# Patient Record
Sex: Male | Born: 1950 | Race: White | Hispanic: No | Marital: Married | Smoking: Former smoker
Health system: Southern US, Community
[De-identification: ages and names within clinical notes are randomized; demographics above are authoritative.]

## PROBLEM LIST (undated history)

## (undated) DIAGNOSIS — C801 Malignant (primary) neoplasm, unspecified: Secondary | ICD-10-CM

## (undated) DIAGNOSIS — K219 Gastro-esophageal reflux disease without esophagitis: Secondary | ICD-10-CM

## (undated) DIAGNOSIS — N189 Chronic kidney disease, unspecified: Secondary | ICD-10-CM

## (undated) DIAGNOSIS — K469 Unspecified abdominal hernia without obstruction or gangrene: Secondary | ICD-10-CM

## (undated) DIAGNOSIS — C4490 Unspecified malignant neoplasm of skin, unspecified: Secondary | ICD-10-CM

## (undated) DIAGNOSIS — I200 Unstable angina: Secondary | ICD-10-CM

## (undated) DIAGNOSIS — K649 Unspecified hemorrhoids: Secondary | ICD-10-CM

## (undated) DIAGNOSIS — D649 Anemia, unspecified: Secondary | ICD-10-CM

## (undated) DIAGNOSIS — E785 Hyperlipidemia, unspecified: Secondary | ICD-10-CM

## (undated) DIAGNOSIS — E119 Type 2 diabetes mellitus without complications: Secondary | ICD-10-CM

## (undated) DIAGNOSIS — I10 Essential (primary) hypertension: Secondary | ICD-10-CM

## (undated) DIAGNOSIS — M1990 Unspecified osteoarthritis, unspecified site: Secondary | ICD-10-CM

## (undated) HISTORY — DX: Gastro-esophageal reflux disease without esophagitis: K21.9

## (undated) HISTORY — DX: Unspecified hemorrhoids: K64.9

## (undated) HISTORY — DX: Unspecified abdominal hernia without obstruction or gangrene: K46.9

## (undated) HISTORY — DX: Type 2 diabetes mellitus without complications: E11.9

## (undated) HISTORY — DX: Anemia, unspecified: D64.9

## (undated) HISTORY — DX: Hyperlipidemia, unspecified: E78.5

## (undated) HISTORY — DX: Essential (primary) hypertension: I10

## (undated) HISTORY — DX: Unspecified osteoarthritis, unspecified site: M19.90

## (undated) HISTORY — DX: Unstable angina: I20.0

## (undated) HISTORY — DX: Chronic kidney disease, unspecified: N18.9

## (undated) HISTORY — DX: Unspecified malignant neoplasm of skin, unspecified: C44.90

## (undated) HISTORY — DX: Malignant (primary) neoplasm, unspecified: C80.1

---

## 1994-04-24 HISTORY — PX: CORONARY ARTERY BYPASS GRAFT: SHX141

## 2008-03-13 ENCOUNTER — Ambulatory Visit: Payer: Self-pay | Admitting: Cardiology

## 2009-01-08 ENCOUNTER — Ambulatory Visit: Payer: Self-pay | Admitting: Cardiology

## 2009-04-24 HISTORY — PX: CORONARY STENT PLACEMENT: SHX1402

## 2010-04-20 ENCOUNTER — Ambulatory Visit: Payer: Self-pay | Admitting: Internal Medicine

## 2010-04-24 DIAGNOSIS — C801 Malignant (primary) neoplasm, unspecified: Secondary | ICD-10-CM

## 2010-04-24 HISTORY — DX: Malignant (primary) neoplasm, unspecified: C80.1

## 2011-03-28 ENCOUNTER — Ambulatory Visit: Payer: Self-pay

## 2011-04-25 ENCOUNTER — Ambulatory Visit: Payer: Self-pay

## 2011-05-26 ENCOUNTER — Ambulatory Visit: Payer: Self-pay

## 2012-04-24 HISTORY — PX: UPPER GI ENDOSCOPY: SHX6162

## 2012-07-23 HISTORY — PX: COLONOSCOPY: SHX174

## 2012-07-26 ENCOUNTER — Ambulatory Visit: Payer: Self-pay | Admitting: Gastroenterology

## 2012-12-17 ENCOUNTER — Ambulatory Visit: Payer: Self-pay | Admitting: Gastroenterology

## 2013-03-24 ENCOUNTER — Ambulatory Visit: Payer: Self-pay | Admitting: Gastroenterology

## 2013-03-25 ENCOUNTER — Emergency Department: Payer: Self-pay | Admitting: Emergency Medicine

## 2013-03-25 LAB — COMPREHENSIVE METABOLIC PANEL
Alkaline Phosphatase: 64 U/L
Anion Gap: 9 (ref 7–16)
Calcium, Total: 8.4 mg/dL — ABNORMAL LOW (ref 8.5–10.1)
Chloride: 101 mmol/L (ref 98–107)
Co2: 25 mmol/L (ref 21–32)
Creatinine: 1.67 mg/dL — ABNORMAL HIGH (ref 0.60–1.30)
EGFR (African American): 50 — ABNORMAL LOW
Glucose: 171 mg/dL — ABNORMAL HIGH (ref 65–99)
Osmolality: 280 (ref 275–301)
SGOT(AST): 26 U/L (ref 15–37)
SGPT (ALT): 33 U/L (ref 12–78)

## 2013-03-25 LAB — URINALYSIS, COMPLETE
Bacteria: NONE SEEN
Bilirubin,UR: NEGATIVE
Blood: NEGATIVE
Glucose,UR: 50 mg/dL (ref 0–75)
Hyaline Cast: 11
Ketone: NEGATIVE
Leukocyte Esterase: NEGATIVE
Nitrite: NEGATIVE
Ph: 5 (ref 4.5–8.0)
Protein: >=500
RBC,UR: 1 /HPF (ref 0–5)
Specific Gravity: 1.023 (ref 1.003–1.030)
Squamous Epithelial: <1
WBC UR: 2 /HPF (ref 0–5)

## 2013-03-25 LAB — CBC WITH DIFFERENTIAL/PLATELET
Basophil #: 0 10*3/uL (ref 0.0–0.1)
Basophil %: 0.3 %
Eosinophil #: 1.2 10*3/uL — ABNORMAL HIGH (ref 0.0–0.7)
Eosinophil %: 11.6 %
HCT: 35.9 % — ABNORMAL LOW (ref 40.0–52.0)
HGB: 12.5 g/dL — ABNORMAL LOW (ref 13.0–18.0)
Lymphocyte #: 0.5 10*3/uL — ABNORMAL LOW (ref 1.0–3.6)
Lymphocyte %: 4.4 %
MCH: 30.6 pg (ref 26.0–34.0)
MCHC: 34.8 g/dL (ref 32.0–36.0)
MCV: 88 fL (ref 80–100)
Monocyte #: 0.7 x10 3/mm (ref 0.2–1.0)
Monocyte %: 6.8 %
Neutrophil #: 8.1 10*3/uL — ABNORMAL HIGH (ref 1.4–6.5)
Neutrophil %: 76.9 %
Platelet: 200 10*3/uL (ref 150–440)
RBC: 4.08 10*6/uL — ABNORMAL LOW (ref 4.40–5.90)
RDW: 14.3 % (ref 11.5–14.5)
WBC: 10.5 10*3/uL (ref 3.8–10.6)

## 2013-03-30 LAB — CULTURE, BLOOD (SINGLE)

## 2013-04-01 ENCOUNTER — Ambulatory Visit: Payer: Self-pay

## 2013-04-08 ENCOUNTER — Ambulatory Visit: Payer: Self-pay | Admitting: Gastroenterology

## 2013-04-09 ENCOUNTER — Encounter: Payer: Self-pay | Admitting: *Deleted

## 2013-04-10 ENCOUNTER — Inpatient Hospital Stay: Payer: Self-pay

## 2013-04-10 DIAGNOSIS — I2510 Atherosclerotic heart disease of native coronary artery without angina pectoris: Secondary | ICD-10-CM

## 2013-04-10 LAB — BASIC METABOLIC PANEL
Anion Gap: 2 — ABNORMAL LOW (ref 7–16)
BUN: 20 mg/dL — ABNORMAL HIGH (ref 7–18)
Calcium, Total: 8.4 mg/dL — ABNORMAL LOW (ref 8.5–10.1)
Co2: 30 mmol/L (ref 21–32)
Creatinine: 1.34 mg/dL — ABNORMAL HIGH (ref 0.60–1.30)
EGFR (African American): >60
EGFR (Non-African Amer.): 56 — ABNORMAL LOW
Glucose: 159 mg/dL — ABNORMAL HIGH (ref 65–99)
Osmolality: 284 (ref 275–301)
Potassium: 3.9 mmol/L (ref 3.5–5.1)
Sodium: 139 mmol/L (ref 136–145)

## 2013-04-10 LAB — HEMOGLOBIN
HGB: 8.4 g/dL — ABNORMAL LOW (ref 13.0–18.0)
HGB: 8.2 g/dL — ABNORMAL LOW (ref 13.0–18.0)

## 2013-04-11 LAB — CBC WITH DIFFERENTIAL/PLATELET
Eosinophil %: 0.6 %
HCT: 24.4 % — ABNORMAL LOW (ref 40.0–52.0)
Lymphocyte #: 1.7 10*3/uL (ref 1.0–3.6)
Lymphocyte %: 30.1 %
MCH: 28.6 pg (ref 26.0–34.0)
MCHC: 33.5 g/dL (ref 32.0–36.0)
Monocyte %: 7.1 %
Neutrophil #: 3.5 10*3/uL (ref 1.4–6.5)
Platelet: 385 10*3/uL (ref 150–440)
RBC: 2.86 10*6/uL — ABNORMAL LOW (ref 4.40–5.90)
RDW: 14.2 % (ref 11.5–14.5)
WBC: 5.7 10*3/uL (ref 3.8–10.6)

## 2013-04-11 LAB — BASIC METABOLIC PANEL
Anion Gap: 5 — ABNORMAL LOW (ref 7–16)
Co2: 29 mmol/L (ref 21–32)
EGFR (Non-African Amer.): 58 — ABNORMAL LOW
Glucose: 111 mg/dL — ABNORMAL HIGH (ref 65–99)
Osmolality: 279 (ref 275–301)
Potassium: 3.6 mmol/L (ref 3.5–5.1)

## 2013-04-15 ENCOUNTER — Ambulatory Visit (INDEPENDENT_AMBULATORY_CARE_PROVIDER_SITE_OTHER): Payer: 59 | Admitting: General Surgery

## 2013-04-15 ENCOUNTER — Encounter: Payer: Self-pay | Admitting: General Surgery

## 2013-04-15 VITALS — BP 130/70 | HR 72 | Resp 14 | Ht 67.0 in | Wt 213.0 lb

## 2013-04-15 DIAGNOSIS — K625 Hemorrhage of anus and rectum: Secondary | ICD-10-CM

## 2013-04-15 DIAGNOSIS — D62 Acute posthemorrhagic anemia: Secondary | ICD-10-CM

## 2013-04-15 LAB — POC HEMOCCULT BLD/STL (OFFICE/1-CARD/DIAGNOSTIC): Fecal Occult Blood, POC: NEGATIVE

## 2013-04-15 NOTE — Progress Notes (Signed)
Patient ID: Roger Knight, male   DOB: 01-Jun-1950, 62 y.o.   MRN: [id number]  Chief Complaint  Patient presents with  . Other    hemorrhoids  Roger Knight is a 62 y.o. male here today for evaluation of hemorrhoids. Patient states he has had them for about three years now. He states they have been bleeding for about six months and noticed some bright red blood in his stool.  The patient presents a complicated history of ongoing rectal bleeding and anemia resulting in hospitalization earlier this month. He has had intermittent bright red blood per dating back to at least bring 2014. This has waxed and waned in severity. Episodes have been painless. He had been maintained on Effient as well as aspirin is post coronary stent placement several years ago. He had undergone coronary artery bypass grafting in the distant past.  No records were available from the referring physician, and it was recreated making use of the hospital data set only.  Colonoscopy completed in April 2014 for heme positive stool and iron deficiency anemia showed diverticuli in the descending and sigmoid colon, and scattered diverticula in the transverse and descending colon. Polyps from the splenic flexure showed tubular adenomas. Upper endoscopy completed at the same time showed erosive gastritis with biopsies failing to show evidence of H. pylori infection. Small bowel follow-through dated 12/17/2012 showed a significantly abnormal transit-time without evidence of dilatation or segmental narrowing. Focal outpouching in the loops of jejunum in the left upper quadrant were felt consistent with small bowel diverticuli. The patient reports capsule endoscopy was completed, but these results could not be located in the hospital data base.   The patient reports since discontinuation of his oral anticoagulants he's had no further rectal bleeding.  HPI  Past Medical History  Diagnosis Date  . Arthritis   . GERD (gastroesophageal  reflux disease)   . Diabetes mellitus without complication   . Hernia   . Anemia   . Hyperlipidemia   . Hypertension   . Carcinoma 2012     skin cancer on neck [facility]    Past Surgical History  Procedure Laterality Date  . Coronary artery bypass graft  1996    [facility]  . Coronary stent placement  2011    [facility]  . Colonoscopy  April 2014  . Upper gi endoscopy  2014    No family history on file.  Social History History  Substance Use Topics  . Smoking status: Former Smoker    Quit date: 04/24/2005  . Smokeless tobacco: Not on file  . Alcohol Use: No    No Known Allergies  Current Outpatient Prescriptions  Medication Sig Dispense Refill  . amLODipine (NORVASC) 5 MG tablet Take 5 mg by mouth daily.      Marland Kitchen aspirin 81 MG tablet Take 81 mg by mouth daily.      Marland Kitchen atorvastatin (LIPITOR) 80 MG tablet Take 80 mg by mouth daily.      Marland Kitchen ezetimibe (ZETIA) 10 MG tablet Take 10 mg by mouth daily.      . Fe Fum-FePoly-Vit C-Vit B3 (INTEGRA PO) Take 1 tablet by mouth 2 (two) times daily.      Marland Kitchen glipiZIDE (GLUCOTROL) 10 MG tablet Take 10 mg by mouth 2 (two) times daily.      . insulin aspart (NOVOLOG) 100 UNIT/ML injection Inject 25 Units into the skin once.      . insulin aspart (NOVOLOG) 100 UNIT/ML injection Inject 74 Units into the skin once.      Marland Kitchen  Liraglutide (VICTOZA [place]) Inject 1.2 Units into the skin daily.      Marland Kitchen lisinopril (PRINIVIL,ZESTRIL) 20 MG tablet Take 20 mg by mouth daily.      Marland Kitchen loratadine (CLARITIN) 10 MG tablet Take 10 mg by mouth daily.      . metFORMIN (GLUCOPHAGE) 500 MG tablet Take by mouth 2 (two) times daily with a meal.      . metoprolol (TOPROL-XL) 200 MG 24 hr tablet Take 200 mg by mouth daily.      . Multiple Vitamins-Minerals (MULTIVITAMIN WITH MINERALS) tablet Take 1 tablet by mouth daily.      . pantoprazole (PROTONIX) 20 MG tablet Take 20 mg by mouth daily.      . pregabalin (LYRICA) 75 MG capsule Take 75 mg by mouth 2 (two) times daily.      . sildenafil  (VIAGRA) 100 MG tablet Take 100 mg by mouth as needed for erectile dysfunction.      . valsartan-hydrochlorothiazide (DIOVAN-HCT) 320-25 MG per tablet Take 1 tablet by mouth daily.       No current facility-administered medications for this visit.    Review of Systems Review of Systems  Constitutional: Negative.   Respiratory: Negative.   Cardiovascular: Negative.   Gastrointestinal: Positive for blood in stool, anal bleeding and rectal pain. Negative for nausea, vomiting, abdominal pain, diarrhea, constipation and abdominal distention.    Blood pressure 130/70, pulse 72, resp. rate 14, height 5\' 7"  (1.702 m), weight 213 lb (96.616 kg).  Physical Exam Physical Exam  Constitutional: He is oriented to person, place, and time. He appears well-developed and well-nourished.  Eyes: No scleral icterus.  Cardiovascular: Normal rate, regular rhythm, normal heart sounds, intact distal pulses and normal pulses.   Pulmonary/Chest: Breath sounds normal.  Abdominal: Soft. Bowel sounds are normal. There is no tenderness.  Genitourinary: Rectal exam shows external hemorrhoid and internal hemorrhoid. Guaiac negative stool.  No bleeding is evident. Stool is Hemoccult negative.  Lymphadenopathy:    He has no cervical adenopathy.  Neurological: He is alert and oriented to person, place, and time.  Skin: Skin is warm and dry.    Data Reviewed Endoscopy was completed. Nonbleeding internal and external hemorrhoids were identified. The rectal mucosa was unremarkable. Normal sphincter tone. 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 Hemoglobin 8.2. Normal electrolytes platelet count and 85,000. White blood cell count 5700 with normal differential.  Abdominal ultrasound dated 04/01/2013 showed evidence of cholelithiasis without cholecystitis.  Gastric emptying study completed 04/08/2013 suggested delayed gastric  emptying. Assessment    Bright red rectal bleeding likely secondary to anorectal source, improved with cessation of oral anticoagulants.    Plan    The patient had previously been prescribed Analpram for internal/external application, which he had not used in spring when his symptoms abated. He will make use of this twice a day and give a phone report in 2 weeks in regards to his progress. If he has recurrent episodes of bright red rectal bleeding, formal hemorrhoidectomy can be considered. If he has no further symptoms, surgical intervention will be deferred.        Robert Bellow 04/17/2013, 9:49 AM

## 2013-04-17 DIAGNOSIS — K625 Hemorrhage of anus and rectum: Secondary | ICD-10-CM | POA: Insufficient documentation

## 2013-04-17 DIAGNOSIS — D62 Acute posthemorrhagic anemia: Secondary | ICD-10-CM | POA: Insufficient documentation

## 2013-04-29 ENCOUNTER — Ambulatory Visit: Payer: 59 | Admitting: General Surgery

## 2013-05-28 ENCOUNTER — Encounter: Payer: Self-pay | Admitting: *Deleted

## 2013-12-17 DIAGNOSIS — M19049 Primary osteoarthritis, unspecified hand: Secondary | ICD-10-CM | POA: Insufficient documentation

## 2013-12-17 DIAGNOSIS — M5416 Radiculopathy, lumbar region: Secondary | ICD-10-CM | POA: Insufficient documentation

## 2013-12-17 DIAGNOSIS — M5136 Other intervertebral disc degeneration, lumbar region: Secondary | ICD-10-CM | POA: Insufficient documentation

## 2013-12-26 ENCOUNTER — Ambulatory Visit: Payer: Self-pay | Admitting: Physical Medicine and Rehabilitation

## 2014-02-11 DIAGNOSIS — I200 Unstable angina: Secondary | ICD-10-CM | POA: Insufficient documentation

## 2014-02-11 DIAGNOSIS — I10 Essential (primary) hypertension: Secondary | ICD-10-CM | POA: Insufficient documentation

## 2014-02-11 HISTORY — DX: Unstable angina: I20.0

## 2014-04-24 DIAGNOSIS — N189 Chronic kidney disease, unspecified: Secondary | ICD-10-CM

## 2014-04-24 HISTORY — DX: Chronic kidney disease, unspecified: N18.9

## 2014-05-05 DIAGNOSIS — R809 Proteinuria, unspecified: Secondary | ICD-10-CM | POA: Insufficient documentation

## 2014-05-05 DIAGNOSIS — G629 Polyneuropathy, unspecified: Secondary | ICD-10-CM | POA: Insufficient documentation

## 2014-05-05 DIAGNOSIS — Z794 Long term (current) use of insulin: Secondary | ICD-10-CM | POA: Insufficient documentation

## 2014-07-21 ENCOUNTER — Other Ambulatory Visit: Payer: Self-pay

## 2014-07-21 NOTE — Patient Outreach (Signed)
[facility]) Care Management  07/21/2014   DEMETRES DARRAGH [birth year] [id number]  Subjective: Spoke to [name] by phone today regarding the change in his Novolog 70/30 to Lantus and Novolog.  He has no concerns with it, in fact he noticed his blood sugars were not improving when he made the switch so Dr. Gabriel Carina increased the Novolog and [name] is getting better blood sugar control- states blood sugars are 135-145mg /dl fasting every day, pre-meal blood sugars always less than 150mg /dl.    Current Medications:  Current Outpatient Prescriptions  Medication Sig Dispense Refill  . amLODipine (NORVASC) 5 MG tablet Take 5 mg by mouth daily.    Marland Kitchen aspirin 81 MG tablet Take 81 mg by mouth daily.    Marland Kitchen atorvastatin (LIPITOR) 80 MG tablet Take 80 mg by mouth daily.    Marland Kitchen ezetimibe (ZETIA) 10 MG tablet Take 10 mg by mouth daily.    Marland Kitchen glipiZIDE (GLUCOTROL) 10 MG tablet Take 10 mg by mouth 2 (two) times daily.    . insulin aspart (NOVOLOG) 100 UNIT/ML injection Inject 15 Units into the skin daily at 12 noon.    . insulin aspart (NOVOLOG) 100 UNIT/ML injection Inject 20 Units into the skin 2 (two) times daily with a meal. Pre breakfast and supper    . insulin glargine (LANTUS) 100 UNIT/ML injection Inject 65 Units into the skin at bedtime.    . Liraglutide (VICTOZA [place]) Inject 1.2 Units into the skin daily.    . metFORMIN (GLUCOPHAGE) 500 MG tablet Take by mouth 2 (two) times daily with a meal.    . metoprolol (TOPROL-XL) 200 MG 24 hr tablet Take 200 mg by mouth daily.    . pregabalin (LYRICA) 75 MG capsule Take 75 mg by mouth 2 (two) times daily.    . valsartan-hydrochlorothiazide (DIOVAN-HCT) 320-25 MG per tablet Take 1 tablet by mouth daily.    . Fe Fum-FePoly-Vit C-Vit B3 (INTEGRA PO) Take 1 tablet by mouth 2 (two) times daily.    . insulin aspart (NOVOLOG) 100 UNIT/ML injection Inject 25 Units into the skin once.    . insulin aspart (NOVOLOG) 100 UNIT/ML injection Inject 74 Units into  the skin once.    Marland Kitchen lisinopril (PRINIVIL,ZESTRIL) 20 MG tablet Take 20 mg by mouth daily.    Marland Kitchen loratadine (CLARITIN) 10 MG tablet Take 10 mg by mouth daily.    . Multiple Vitamins-Minerals (MULTIVITAMIN WITH MINERALS) tablet Take 1 tablet by mouth daily.    . pantoprazole (PROTONIX) 20 MG tablet Take 20 mg by mouth daily.    . sildenafil (VIAGRA) 100 MG tablet Take 100 mg by mouth as needed for erectile dysfunction.     No current facility-administered medications for this visit.   [place]        Patient Outreach Telephone from 07/21/2014 in [place] Problem One  patient is not meeting the national goal for exercise- 150 minutes/week   Care Plan for Problem One  Active   Interventions for Problem One Long Term Goal  - Begin slowly aiming for 10 minutes per day 5x/week- increase 5 minutes per day each week until you reach 30 minutes a day, 5x/week   [facility] Long Term Goal (31-90 days)  Patient will exercise outside his job- 150 minutes per week by the next visit- end of May, 2016   [place] Term Goal Start Date  07/21/14      Plan: [name] will  continue to take 65 units Lantus daily and Novolog 3 times per day; 15 units with lunch, 20 units with breakfast and supper.  Wants to increase exercise- encouraged and goal set.   Follow up with Dr. Gabriel Carina in April 2016 at which time she will likely do an A1C test.  I will follow up with [name] in May, 2016.    [name], RN, BA, [place], [place] Direct Dial:  [phone]  Fax:  [phone] E-mail: [email or web address] [address] [place], [place]  [address]

## 2014-08-14 NOTE — H&P (Signed)
PATIENT NAME:  Roger Knight, Roger Knight MR#:  [id number] DATE OF BIRTH:  1950-09-26  DATE OF ADMISSION:  04/10/2013  REASON FOR CONSULTATION: Acute lower gastrointestinal bleed.   HISTORY OF PRESENT ILLNESS: This is a very pleasant 64 year old gentleman who works in [employer]. We have been following him recently for anemia, as well as GI bleeding. He has had an extensive workup with GI at [facility]. This has included upper endoscopy in 07/2012, colonoscopy as well in 07/2012 and capsule endoscopy in 02/2013 and a gastric emptying study last week. In the past, he has had hemoglobins down to 9.9 with evidence of iron deficiency, most recently back in April when his ferritin was 6 and his hemoglobin was down to 9.9. Hemoccult testing was positive and that is what initiated his initial workup. He also has a history of coronary artery disease and is on Effient and aspirin. With iron supplementation, his hemoglobin had actually increased quite nicely to 12.8 in 09/2012. However, he continued to have some bright red blood per rectum over the last 2 to 3 weeks. His hemoglobin at the beginning of December was 12.0 and has slowly trended down to 10.4 on the 11th, 9.2 on the 17th and 8.5 today. He reports several episodes of passing bright red blood in the toilet without actually having a bowel movement. This is painless rectal bleeding without any abdominal pain, nausea or vomiting. He has had an episode earlier this month where he had high fevers and was seen in the ER and was treated with levofloxacin. It is unclear if this was just a viral infection but he was treated with levofloxacin as his white count had elevated to 12.3 with a neutrophilic pattern. He clinically responded to this but had a bump in his LFTs, mainly in his ALT up to about 100. This was presumably due to the Levaquin and has improved since he stopped that.   Currently, the patient says his last bowel movement was  yesterday, which was soft and had some small amount of bright red blood in it. He is having no chest pain, shortness of breath or edema. He remains on his cardiovascular meds, as well as his Effient and aspirin. He does also take medications for diabetes.   PAST MEDICAL HISTORY: 1.  Type 2 diabetes.  2.  Coronary artery disease, status post percutaneous stent placement.  3.  Hiatal hernia. 4.  Dyslipidemia.  5.  Rectal dysfunction.  6.  GERD.   PAST SURGICAL HISTORY:  Four-vessel bypass surgery in 1996.   SOCIAL HISTORY: The patient is married. 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 He has 1 child and 2 stepchildren. He does not smoke cigarettes. No alcohol use.   FAMILY HISTORY: No diabetes, heart disease or hypertension.  REVIEW OF SYSTEMS:  Eleven systems reviewed and negative except as per HPI.   CURRENT MEDICATIONS: Include aspirin, Claritin, Zetia, metoprolol, amlodipine, Diovan, Viagra, glipizide, atorvastatin, Victoza, lisinopril, Effient, NovoLog, Lyrica, Protonix, metformin, Tylenol.   ALLERGIES:  No known drug allergies.   PHYSICAL EXAMINATION: GENERAL: The patient is afebrile. Blood pressure is 112/72, pulse 80, weight 209.  GENERAL: He is pleasant, interactive, in no acute distress.  HEENT: Pupils equal, round, reactive to light and accommodation. Extraocular movements are intact. Sclerae anicteric. Oropharynx is clear. NECK: Supple.  HEART: Regular.  LUNGS: Clear.  ABDOMEN: Soft, nontender, nondistended. No hepatosplenomegaly.  EXTREMITIES: No clubbing, cyanosis or edema.  NEUROLOGIC: He is alert  and oriented x 3. Grossly nonfocal neuro exam.  DIAGNOSTIC AND RADIOLOGICAL DATA:  PT/INR done 12/18 was 0.9. White blood count 5.0, hemoglobin 8.5 trending down from 9.2 yesterday, 10.4 on the 11th, 11.4 on the 8th and 11.5 on the 5th and 12.0 on the 1st. Hepatic panel done 12/17 shows albumin slightly low at 3.3 and total protein slightly low at 6.0. He  had hepatology testing 12/11 with negative hep C, hepatitis B surface antigen and positive hepatitis B surface antibody. Given his prior abnormal LFTs and febrile episode, he had a CMV IgM which was negative. Basic metabolic panel done [id number] shows a creatinine of 1.3. His most recent A1c done in September showed an A1c of 7.3. The patient did have an ultrasound of his abdomen done 04/01/2013, which showed cholelithiasis without associated findings to suggest acute cholecystitis. There were bilateral renal cysts. He was seen by nephrology for the renal cysts. We felt these were likely benign. He had an x-ray of his abdomen 03/24/2013 which showed nonspecific bowel gas pattern. There is a moderate amount of right-sided colonic stool. The patient had a gastric emptying study done 12/16, which showed delayed gastric emptying study.   IMPRESSION: A 64 year old gentleman coronary artery disease and hyperlipidemia and diabetes who has had decreasing hemoglobin over the last 2 to 3 weeks from 12 to 8.4 today. He has had bright red blood per rectum which has been painless. He has undergone an outpatient workup which has been relatively unrevealing. He does have known hemorrhoids. 1.  Gastrointestinal bleed. I suspect this is hemorrhoidal or diverticular. We will admit him and do type and screen. He does have a history of coronary artery disease. I want to make sure we do not develop issues with ischemia and angina with the decreasing hemoglobin. He is currently on aspirin and Effient and I will hold for today and discuss further with Dr. Ubaldo Glassing or on-call cardiology regarding this. We will consult gastroenterology and I have spoken with Dr. Allen Norris who is on-call. I suspect the patient needs likely a flexible sigmoidoscopy to look for source of bleeding. He may need surgical consultation to fix any hemorrhoids that are present. He does have known diverticulosis or if the bleeding continues without an obvious source and a  flexible sigmoidoscopy, he may benefit from a bleeding scan.  2.  Coronary artery disease and hypertension. We will resume his outpatient medications and consult cardiology regarding holding his Effient and aspirin. He is relatively asymptomatic at this point.  3.  Diabetes. We will place him on his outpatient regimen, as well as sliding scale insulin. We will hold his metformin while he is inpatient.  4.  Hyperlipidemia. We will continue him on his atorvastatin at this point.  5.  Delayed gastric emptying with likely gastroparesis. He is relatively asymptomatic from this point of view but he can follow up with gastroenterology and potentially start Reglan as needed. 6.  Deep vein thrombosis prophylaxis. We will place the patient on sequential compression devices as he is actively bleeding and we will avoid anticoagulants. We will continue him on proton pump inhibitor as well.  Consultations have been placed for gastroenterology and cardiology.   ____________________________ Cheral Marker. Ola Spurr, MD dpf:ce D: 04/10/2013 15:47:55 ET T: 04/10/2013 16:12:44 ET JOB#: [id number]  cc: Cheral Marker. Ola Spurr, MD, <Dictator> Hafiz Irion Ola Spurr MD ELECTRONICALLY SIGNED 04/12/2013 20:53

## 2014-08-14 NOTE — Discharge Summary (Signed)
PATIENT NAME:  Roger Knight, Roger Knight MR#:  [id number] DATE OF BIRTH:  1951/03/20  DATE OF ADMISSION:  04/10/2013 DATE OF DISCHARGE:  04/11/2013  REASON FOR ADMISSION: GI bleed, anemia and coronary artery disease.   HISTORY OF PRESENT ILLNESS: Please see initial history and physical for details. Briefly, the patient was admitted with decreasing hemoglobin from 12 down to 8.5 as an outpatient in the setting of recurrent bright red blood per rectum. He had had an extensive workup. Please see admission history and physical for details.   HOSPITAL COURSE BY ISSUE: GI bleed. His hemoglobin trended down to 8.2. He was seen by cardiology and GI. He had no further active bleeding. His Effient was held. He will follow up with surgery on Tuesday for evaluation of possible hemorrhoidectomy. It is also possible, he has diverticular bleeding. If his hemorrhoids are corrected and he continues to bleed, he will likely need further evaluation for the diverticular bleeding.   DISCHARGE MEDICATIONS: Please see [facility] discharge summary details.   DISCHARGE DIET: Bland soft diet until followup.  DISCHARGE FOLLOWUP: The patient needs to come Monday for a hemoglobin check in my clinic. I will see him in 1 to 2 weeks otherwise.   TIME SPENT: 35 minutes.  ____________________________ Cheral Marker. Ola Spurr, MD dpf:aw D: 04/11/2013 13:17:15 ET T: 04/11/2013 14:05:39 ET JOB#: [id number]  cc: Cheral Marker. Ola Spurr, MD, <Dictator> Amayra Kiedrowski Ola Spurr MD ELECTRONICALLY SIGNED 04/12/2013 20:54

## 2014-08-14 NOTE — Consult Note (Signed)
Brief Consult Note: Diagnosis: GI bleed, anemia.   Patient was seen by consultant.   Consult note dictated.   Comments: Roger Knight is a pleasant 64 y/o caucasian male with chronic IDA & intermittent gross bright red rectal bleeding.  Extensive work-up at [place] pt has appt Tues outpatient for surgical opinion regarding hemorrhoids.  Effient & Aspirin concommitantly significantly increase his risk of GI bleeding  Cannot r/o diverticular bleed at this time.  Bleeding scan nor flexible sigmoidoscopy would be helpful as last episode of bleeding was over 24 hrs ago.  Follow hgb for stability & consider above if another episode of bleeding occurs.  I have discussed his care with Dr Lucilla Lame.  Thanks for consult.  Please see full dictated note. [id number].  Electronic Signatures: Andria Meuse (NP)  (Signed 18-Dec-14 21:40)  Authored: Brief Consult Note   Last Updated: 18-Dec-14 21:40 by Andria Meuse (NP)

## 2014-08-14 NOTE — Consult Note (Signed)
Chief Complaint:  Subjective/Chief Complaint Pt denies any further bleeding, abdominal pain, nausea or vomiting.  Hgb stable.   VITAL SIGNS/ANCILLARY NOTES: **Vital Signs.:   19-Dec-14 07:44  Vital Signs Type Pre Medication  Temperature Temperature (F) 97.9  Celsius 36.6  Temperature Source oral  Pulse Pulse 71  Respirations Respirations 18  Systolic BP Systolic BP [id number]  Diastolic BP (mmHg) Diastolic BP (mmHg) 85  Mean BP 105  Pulse Ox % Pulse Ox % 98  Pulse Ox Activity Level  At rest  Oxygen Delivery Room Air/ 21 %   Brief Assessment:  GEN well developed, well nourished, no acute distress, A/Ox3.  Wife at bedside.   Cardiac Regular   Respiratory normal resp effort   Gastrointestinal Normal   Gastrointestinal details normal Soft  Nontender  Nondistended  Bowel sounds normal  No rebound tenderness  No gaurding   EXTR negative cyanosis/clubbing, negative edema   Additional Physical Exam Skin: pink, warm & dry   Lab Results: Routine Chem:  19-Dec-14 06:39   Sodium, Serum 139   Assessment/Plan:  Assessment/Plan:  Assessment GI bleed:  Likely diverticular vs. hemorrhoidal on effient & aspirin.  Appreciate cardiology assistance.  Effient discontinued. Reviewed capsule report & abdnormal mucosa proximal 1/3 small bowel noted.  Clinic had no records of celiac panel.  DC home today.   Plan 1) Continue BID PPI 2)  TTG IgA/IgA 3) FU with surgeon regarding hemorrhoids 4) Monitor for further bleeding & call Dr Gustavo Lah if recurs ASAP 5) Return to Dr Gustavo Lah to complete IDA work-up Please call if you have any questions or concerns   Electronic Signatures: Andria Meuse (NP)  (Signed 19-Dec-14 10:24)  Authored: Chief Complaint, VITAL SIGNS/ANCILLARY NOTES, Brief Assessment, Lab Results, Assessment/Plan   Last Updated: 19-Dec-14 10:24 by Andria Meuse (NP)

## 2014-08-14 NOTE — Consult Note (Signed)
PATIENT NAME:  Roger Knight, Roger Knight MR#:  [id number] DATE OF BIRTH:  14-Jun-1950  DATE OF CONSULTATION:  04/10/2013  REFERRING PHYSICIAN:  Cheral Marker. Ola Spurr, MD CONSULTING PHYSICIAN:  Corey Skains, MD  REASON FOR CONSULTATION: Coronary artery disease, previous myocardial infarction, hypertension, hyperlipidemia, diabetes, with lower GI bleed and concerns for antiplatelet therapy.   CHIEF COMPLAINT: "I have a GI bleed."  HISTORY OF PRESENT ILLNESS: This is a 64 year old male with known coronary artery disease, status post coronary artery bypass graft 18 years ago and stent placement of the left main after failed graft 3 years prior. He has had a recent stress test within the last 6 months with no evidence of significant myocardial ischemia but previous myocardial infarction. He has been on appropriate medication management for his cardiovascular disease as well as Effient and aspirin 81 mg. He has had some bleeding issues in the past. He now has a lower GI bleed with quite a bit of bright red blood and hemoglobin dropping down to 8.7. Creatinine is 1.3, consistent with possible mild dehydration. He feels good at this time, with an EKG showing normal sinus rhythm, normal EKG, and no evidence of myocardial infarction or anginal symptoms or heart failure. Therefore, further evaluation and treatment options are necessary.   REVIEW OF SYSTEMS: The remainder review of systems is negative for vision change, ringing in the ears, hearing loss, cough, congestion, heartburn, nausea, vomiting, diarrhea. Positive for bloody stool. Negative chest pain, syncope, dizziness, nausea, diaphoresis, known blood clots, frequent urination, urination at night, muscle weakness, numbness, anxiety, depression, skin lesions, skin rashes.   PAST MEDICAL HISTORY: 1.  Coronary artery disease. 2.  Bypass surgery.  3.  Myocardial infarction.  4.  Hypertension.  5.  Diabetes.  6.  GI bleed.   FAMILY HISTORY: No family  members with early onset of cardiovascular disease or hypertension.   SOCIAL HISTORY: Currently denies alcohol or tobacco use.   ALLERGIES: As listed.   MEDICATIONS: As listed.   PHYSICAL EXAMINATION: VITAL SIGNS: Blood pressure is 122/68 bilaterally, heart rate 62 upright, reclining and regular.  GENERAL: He is a well appearing male in no acute distress.  HEENT: No icterus, thyromegaly, ulcers, hemorrhage, or xanthelasma.  CARDIOVASCULAR: Regular rate and rhythm with normal S1 and S2 without murmur, gallop, or rub. PMI is diffuse. Carotid upstroke normal without bruit. Jugular venous pressure is normal.  LUNGS: Clear to auscultation with normal respirations.  ABDOMEN: Soft, nontender, without hepatosplenomegaly or masses. Abdominal aorta is normal size without bruit.  EXTREMITIES: 2+ pulses in dorsal, pedal, radial, and femoral arteries without lower extremity edema, cyanosis, clubbing, or ulcers.  NEUROLOGIC: He is oriented to time, place, and person, with normal mood and affect.   ASSESSMENT: A 64 year old male with known coronary artery disease, hypertension, hyperlipidemia, diabetes, with a lower gastrointestinal bleed, on Effient and aspirin, needing adjustments. We have discussed at length all risks and benefits of left main coronary artery stenting and gastrointestinal bleeds, and the patient would benefit from changes.   RECOMMENDATIONS: 1.  Continue medication management for risk factors for cardiovascular disease including hypertension, hyperlipidemia, diabetes, with no changes to that. 2.  Discontinue Effient, likely causing or exacerbating lower GI bleed.  3.  Continue aspirin for further risk reduction and bleeding.  4.  No further cardiac diagnostics at this time due to patient currently is not having any significant cardiovascular symptoms of angina or congestive heart failure.  5.  Proceed to further treatment and work-up of GI bleed  as necessary due to patient at relatively  low risk.   ____________________________ Corey Skains, MD bjk:jcm D: 04/10/2013 19:36:54 ET T: 04/10/2013 19:54:56 ET JOB#: [id number]  cc: Corey Skains, MD, <Dictator> Corey Skains MD ELECTRONICALLY SIGNED 04/22/2013 8:05

## 2014-08-15 NOTE — Consult Note (Signed)
PATIENT NAME:  Roger Knight, Roger Knight MR#:  [id number] DATE OF BIRTH:  May 15, 1950  DATE OF CONSULTATION:  04/10/2013  CONSULTING PHYSICIAN:  Andria Meuse, NP and Lucilla Lame, MD  PRIMARY CARE PHYSICIAN: Dr. Adrian Prows   PRIMARY GASTROENTEROLOGIST: Dr. Gustavo Lah.   CARDIOLOGIST: Dr. Ubaldo Glassing.   REASON FOR CONSULTATION: GI bleed, anemia.   HISTORY OF PRESENT ILLNESS: Roger Knight is a 64 year old Caucasian male patient of [place] with Dr. Gustavo Lah. Over the last 5 months, he has been extensively evaluated for iron deficiency anemia. He reports over the last 1 to 2 weeks, he has had a couple episodes of gross bright red blood with clots without a bowel movement. About 2 weeks ago, he was treated with Levaquin and had a fever of 103. Levaquin has been stopped, as he did have a mild bump in his transaminases. His workup has included an EGD by Dr. Gustavo Lah, 07/26/2012, which showed an irregular Z line, moderate chronic erosive gastritis and was a friable gastric body edema and was negative for H. pylori. Colonoscopy of the same date showed multiple sigmoid, descending, transverse and ascending colon diverticula. He had 3 splenic flexure polyps removed; the largest 11 mm, and there were 2 tubular adenomas and hyperplastic polyps. He has had an abnormal gastric emptying study, and was given a prescription for Reglan 5 mg t.i.d., but has not started this yet. He denies any nausea, vomiting, heartburn or indigestion. He is on Protonix 40 mg b.i.d. He does report getting capsule study in November, which was normal. He is being referred to Dr. Bary Castilla and has an appointment next Tuesday for consideration of hemorrhoid management. His last bowel movement was yesterday morning. He does take 2 FiberCon b.i.d. and docusate b.i.d. He denies any diarrhea or constipation, although he did have 4 or 5 loose stools over the weekend. He occasionally has some "gas pains," which pass with flatulence. His  hemoglobin was 12.5 on December 2nd, down to 9.2 yesterday, down to 8.4 today. He is on aspirin 81 mg daily, as well as Effient daily with a history of stent and CABG. Up until July, he was taking 4 Advil every morning, but has not taken any NSAIDs recently. He denies any alcohol use. Ultrasound of the abdomen done December 9th showed cholelithiasis without acute cholecystitis, bilateral renal cysts.  X-ray of the abdomen December 1st showed nonspecific bowel gas pattern, moderate amount of right-sided colonic stool.   PAST MEDICAL AND SURGICAL HISTORY: Hyperlipidemia, diabetes mellitus, hypertension, chronic iron deficiency anemia with a ferritin of 6 in April 2014 with EGD, colonoscopy and capsule study as above; coronary artery disease, status post CABG and stent placement, followed by Dr. Ubaldo Glassing on Effient and aspirin; diverticulosis, tubular adenomas of the colon and chronic GERD and gastroparesis (not yet on medications, but asymptomatic).   MEDICATIONS PRIOR TO ADMISSION: Aspirin 81 mg daily, acetaminophen 325 mg q.4 hours p.r.n. amlodipine 5 mg daily, atorvastatin 80 mg daily, Colace 100 mg b.i.d., FiberCon 2 tabs b.i.d., Diovan HCT 25/320 daily, Effient 10 mg daily, ezetimibe 10 mg daily, glipizide 10 mg b.i.d. before meals, sliding scale insulin 4 times a day, iron 100 mg and vitamin B complex with C, folic acid and iron daily; lisinopril 20 mg daily, loratadine 10 mg daily, metoprolol succinate 200 mg daily, multivitamins daily, Novolin 70/30, 25 units in the morning and 64 units at bedtime, and pantoprazole 40 mg b.i.d., pregabalin 75 mg b.i.d., Victoza 18 mg per 3 mL, 1.2 units subcutaneously at bedtime.  ALLERGIES: No known drug allergies.   FAMILY HISTORY: There is no known family history of colorectal carcinoma, liver or chronic GI problems.   SOCIAL HISTORY: He is married. 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 He denies any tobacco use. He quit 7 years ago.  Denies any illicit drug use or alcohol use. He has 3 grown, healthy children.   REVIEW OF SYSTEMS: See HPI, otherwise negative 12 point review of systems.     PHYSICAL EXAMINATION: VITAL SIGNS: Temp 97.9, pulse 77, respirations 18, blood pressure 136/79, and O2 sat  98% on room air.  GENERAL: He is a well-developed, well-nourished Caucasian male who is alert, oriented, in no acute distress. He has his wife and his daughter at bedside.  HEENT: Sclerae clear, anicteric. Conjunctivae pink. Oropharynx pink and moist without any lesions.  NECK: Supple without mass or thyromegaly.  CHEST: Heart regular rate and rhythm. Normal S1, S2 without murmurs, clicks, rubs or gallops.  LUNGS: Clear to auscultation bilaterally.  ABDOMEN: Protuberant with positive bowel sounds x 4. No bruits auscultated. Abdomen is soft, nontender, nondistended, without palpable mass or hepatosplenomegaly. No rebound, tenderness or guarding. Exam is limited given the patient's body habitus.  RECTAL: He has a small nonbleeding, noninflamed external hemorrhoid, good sphincter tone. No internal masses palpated. Small amount of formed, brown stool within the vault. No gross blood. Hemoccult was not obtained.   LABORATORY STUDIES: Glucose 109, BUN 20, creatinine 1.34, anion gap 2, calcium 8.4, otherwise normal BMP.   IMPRESSION: Roger Knight is a pleasant 64 year old Caucasian male with chronic iron deficiency anemia and intermittent gross bright red rectal bleeding. He has had extensive workup under the direction of Dr. Gustavo Lah at [facility], and he has an appointment Tuesday outpatient for surgical opinion regarding hemorrhoid management. He is on Effient and aspirin concomitantly, which significantly increases his risk of GI bleeding. This has been addressed by Dr. Nehemiah Massed, and his input is appreciated. We cannot rule out diverticular bleed at this time. Bleeding scan or flexible sigmoidoscopy would not be helpful as the last  episode of bleeding was over 24 hours ago. Would encourage to follow hemoglobin for stability and consider above if  another episode of bleeding occurs. I have discussed his care with Dr. Lucilla Lame.   PLAN: 1.  Remain on b.i.d. PPI.  2.  Continue to follow hemoglobin.  3.  If an episode of bright red blood, consider flexible sigmoidoscopy versus bleeding scan at that time.  4.  Appreciate cardiology's input regarding management of Effient and aspirin.   We would like to thank you for allowing [place] to participate in the care of Roger Knight.     ____________________________ Andria Meuse, NP klj:dmm D: 04/10/2013 21:40:40 ET T: 04/10/2013 22:04:29 ET JOB#: [id number]  cc: Andria Meuse, NP, <Dictator> Javier Docker. Ubaldo Glassing, MD Cheral Marker. Ola Spurr, MD Lollie Sails, MD Andria Meuse FNP ELECTRONICALLY SIGNED 05/07/2013 [id number]

## 2014-09-15 ENCOUNTER — Other Ambulatory Visit: Payer: Self-pay

## 2014-09-15 NOTE — Patient Outreach (Signed)
[facility]) Care Management  [place]  09/15/2014   Roger Knight 07-14-1950 [id number]  Subjective: Patient comes for a regular visit- has seen Dr. Gabriel Carina who put him on Spirinolactone and had him follow up with Dr. Holley Raring.  He has been diagnosed with CKD. Improved blood sugars although he had 3 lows over the past few weeks.   He has found a correlation with low morning blood sugars when he eats fruit at night so he has eliminated it.  He does not take his Lantus at bedtime if his pm blood sugar is less than 100mg /dl. Now walking about 157minutes/week. Patient denies chest pain or SOB and states that his nitroglycerine is within date.  He knows the symptoms of a heart attack and stroke.   Objective:  Filed Vitals:   09/15/14 1521  BP: 140/68     Current Medications:  Current Outpatient Prescriptions  Medication Sig Dispense Refill  . amLODipine (NORVASC) 5 MG tablet Take 5 mg by mouth daily.    Marland Kitchen aspirin 81 MG tablet Take 81 mg by mouth daily.    Marland Kitchen atorvastatin (LIPITOR) 80 MG tablet Take 80 mg by mouth daily.    . cetirizine (ZYRTEC) 10 MG tablet Take 10 mg by mouth daily.    Marland Kitchen ezetimibe (ZETIA) 10 MG tablet Take 10 mg by mouth daily.    Marland Kitchen FIBER FORMULA PO Take by mouth 2 (two) times daily.    Marland Kitchen glipiZIDE (GLUCOTROL) 10 MG tablet Take 10 mg by mouth 2 (two) times daily.    . insulin aspart (NOVOLOG) 100 UNIT/ML injection Inject 25 Units into the skin once. Before supper    . insulin aspart (NOVOLOG) 100 UNIT/ML injection Inject 15 Units into the skin daily at 12 noon.    . insulin aspart (NOVOLOG) 100 UNIT/ML injection Inject 20 Units into the skin daily before breakfast.     . insulin glargine (LANTUS) 100 UNIT/ML injection Inject 65 Units into the skin at bedtime.    . Liraglutide (VICTOZA [place]) Inject 1.2 Units into the skin daily.    . metFORMIN (GLUCOPHAGE) 500 MG tablet Take 1,000 mg by mouth 2 (two) times daily with a meal.     . metoprolol  (TOPROL-XL) 200 MG 24 hr tablet Take 200 mg by mouth daily.    . Multiple Vitamins-Minerals (MULTIVITAMIN WITH MINERALS) tablet Take 1 tablet by mouth daily.    . pantoprazole (PROTONIX) 20 MG tablet Take 40 mg by mouth daily.     . pregabalin (LYRICA) 75 MG capsule Take 75 mg by mouth 2 (two) times daily.    . sildenafil (VIAGRA) 100 MG tablet Take 100 mg by mouth as needed for erectile dysfunction.    Marland Kitchen spironolactone (ALDACTONE) 25 MG tablet Take 25 mg by mouth 2 (two) times daily.    Marland Kitchen triamcinolone cream (KENALOG) 0.1 % Apply 1 application topically 2 (two) times daily.    . valsartan-hydrochlorothiazide (DIOVAN-HCT) 320-25 MG per tablet Take 1 tablet by mouth daily.    . Fe Fum-FePoly-Vit C-Vit B3 (INTEGRA PO) Take 1 tablet by mouth 2 (two) times daily.    . insulin aspart (NOVOLOG) 100 UNIT/ML injection Inject 74 Units into the skin once.    Marland Kitchen lisinopril (PRINIVIL,ZESTRIL) 20 MG tablet Take 20 mg by mouth daily.    Marland Kitchen loratadine (CLARITIN) 10 MG tablet Take 10 mg by mouth daily.     No current facility-administered medications for this visit.    Functional Status:  In your present state of health, do you have any difficulty performing the following activities: 09/15/2014  Hearing? N  Vision? N  Difficulty concentrating or making decisions? N  Walking or climbing stairs? N  Dressing or bathing? N  Doing errands, shopping? N    Fall/Depression Screening: PHQ 2/9 Scores 09/15/2014  PHQ - 2 Score 0    Assessment/plan: Continue to -  follow up with MDs as requested       -  Check blood sugars regularly       - continue to take meds as ordered       [facility] CM Care Plan Problem One        Patient Outreach from 09/15/2014 in [place] Problem One  patient is not meeting the national goal for exercise- 150 minutes/week   Care Plan for Problem One  Active   [facility] Long Term Goal (31-90 days)  Patient will exercise outside his job- 150 minutes per week by the next  visit- end of May, 2016   [place] Term Goal Start Date  07/21/14   Interventions for Problem One Long Term Goal  -- [continue to move towards 150 minutes]     Telephone follow up in July 2016.  [name], RN, BA, [place], [place] Direct Dial:  [phone]  Fax:  [phone] E-mail: [email or web address] [address], [place], [place]  [address]

## 2014-09-16 ENCOUNTER — Ambulatory Visit: Payer: 59

## 2014-10-13 ENCOUNTER — Inpatient Hospital Stay: Payer: 59 | Attending: Hematology and Oncology | Admitting: Hematology and Oncology

## 2014-10-13 VITALS — BP 151/85 | HR 77 | Temp 97.5°F | Resp 20 | Ht 67.0 in | Wt 218.0 lb

## 2014-10-13 DIAGNOSIS — E785 Hyperlipidemia, unspecified: Secondary | ICD-10-CM | POA: Insufficient documentation

## 2014-10-13 DIAGNOSIS — N183 Chronic kidney disease, stage 3 (moderate): Secondary | ICD-10-CM | POA: Diagnosis not present

## 2014-10-13 DIAGNOSIS — K219 Gastro-esophageal reflux disease without esophagitis: Secondary | ICD-10-CM | POA: Diagnosis not present

## 2014-10-13 DIAGNOSIS — I129 Hypertensive chronic kidney disease with stage 1 through stage 4 chronic kidney disease, or unspecified chronic kidney disease: Secondary | ICD-10-CM | POA: Insufficient documentation

## 2014-10-13 DIAGNOSIS — E119 Type 2 diabetes mellitus without complications: Secondary | ICD-10-CM | POA: Insufficient documentation

## 2014-10-13 DIAGNOSIS — Z87891 Personal history of nicotine dependence: Secondary | ICD-10-CM | POA: Insufficient documentation

## 2014-10-13 DIAGNOSIS — D649 Anemia, unspecified: Secondary | ICD-10-CM

## 2014-10-13 DIAGNOSIS — Z7982 Long term (current) use of aspirin: Secondary | ICD-10-CM | POA: Diagnosis not present

## 2014-10-13 DIAGNOSIS — D472 Monoclonal gammopathy: Secondary | ICD-10-CM

## 2014-10-13 DIAGNOSIS — Z79899 Other long term (current) drug therapy: Secondary | ICD-10-CM | POA: Diagnosis not present

## 2014-10-13 DIAGNOSIS — M545 Low back pain: Secondary | ICD-10-CM | POA: Insufficient documentation

## 2014-10-13 DIAGNOSIS — Z85828 Personal history of other malignant neoplasm of skin: Secondary | ICD-10-CM | POA: Insufficient documentation

## 2014-10-13 DIAGNOSIS — I10 Essential (primary) hypertension: Secondary | ICD-10-CM | POA: Diagnosis not present

## 2014-10-13 DIAGNOSIS — D638 Anemia in other chronic diseases classified elsewhere: Secondary | ICD-10-CM | POA: Insufficient documentation

## 2014-10-14 ENCOUNTER — Encounter: Payer: Self-pay | Admitting: Hematology and Oncology

## 2014-10-14 ENCOUNTER — Inpatient Hospital Stay: Payer: 59

## 2014-10-14 DIAGNOSIS — D472 Monoclonal gammopathy: Secondary | ICD-10-CM | POA: Diagnosis not present

## 2014-10-14 DIAGNOSIS — D649 Anemia, unspecified: Secondary | ICD-10-CM

## 2014-10-14 LAB — RETICULOCYTES
RBC.: 4.01 MIL/uL — ABNORMAL LOW (ref 4.40–5.90)
Retic Count, Absolute: 88.2 10*3/uL (ref 19.0–183.0)
Retic Ct Pct: 2.2 % (ref 0.4–3.1)

## 2014-10-14 LAB — FERRITIN: Ferritin: 6 ng/mL — ABNORMAL LOW (ref 24–336)

## 2014-10-14 LAB — IRON AND TIBC
Iron: 38 ug/dL — ABNORMAL LOW (ref 45–182)
Saturation Ratios: 9 % — ABNORMAL LOW (ref 17.9–39.5)
TIBC: 447 ug/dL (ref 250–450)
UIBC: 409 ug/dL

## 2014-10-14 LAB — VITAMIN B12: Vitamin B-12: 311 pg/mL (ref 180–914)

## 2014-10-14 LAB — FOLATE: Folate: 43 ng/mL (ref >5.9)

## 2014-10-14 LAB — TSH: TSH: 1.913 u[IU]/mL (ref 0.350–4.500)

## 2014-10-14 NOTE — Progress Notes (Signed)
Patient here today for initial evaluation regarding protein in urine, referred by Dr. Holley Raring.

## 2014-10-14 NOTE — Progress Notes (Signed)
[facility] day:  10/13/2014  Chief Complaint: Roger Knight is a 63 y.o. male with stage III chronic kidney disease who is referred in consultation by Dr.Munsoor Lateef for an abnormal SPEP.  HPI:  The patient notes a history of hypertension and borderline diabetes.  He is followed by Dr. Ola Spurr. He states that he was referred to an endocrinologist for his diabetes. He subsequently was referred to Dr. Holley Raring for management of his kidney disease.   He states that his kidney disease has been stable. Available labs include a creatinine of 1.6 on 05/01/2014, 1.5 on 05/28/2014 and 2.0 on 09/03/2014.  He was seen by Dr. Holley Raring on 09/14/2014. At that time he was noted to have from foamy urine and proteinuria. Serum electrophoresis revealed an lbumin of 2.9 and an M spike of 0.2 g/dL. CBC included a hematocrit 31.4, hemoglobin 9.8, MCV 79, platelets 278,000, and white count 7200 with an ANC of 4800.  Creatinine was 1.87.  Total protein was 5.8 with an albumin of 3.5. Calcium was 9.1. Random urine protein electrophoresis revealed 884m/dL protein.  Urine protein electrophoresis was not sufficient for M spike analysis.  He denies any recurrent infections. He denies any bone pain. He states that he has felt great for the past 20 years. He notes some low back pain due to "old age and sports for 30 years".  Past Medical History  Diagnosis Date  . Arthritis   . GERD (gastroesophageal reflux disease)   . Diabetes mellitus without complication   . Hernia   . Anemia   . Hyperlipidemia   . Hypertension   . Carcinoma 2012     skin cancer on neck [facility]  . Chronic kidney disease 2016  . Skin cancer     Past Surgical History  Procedure Laterality Date  . Coronary artery bypass graft  1996    [facility]  . Coronary stent placement  2011    [facility]  . Colonoscopy  April 2014  . Upper gi endoscopy  2014    Family History  Problem Relation Age of Onset  .  Dementia Mother 725 . Osteoarthritis Mother   . Cancer Other     skin cancer    Social History:  reports that he quit smoking about 9 years ago. His smoking use included Cigarettes. He has quit using smokeless tobacco. His smokeless tobacco use included Chew. He reports that he does not drink alcohol or use illicit drugs.  The patient is alone today.  Allergies: No Known Allergies  Current Medications: Current Outpatient Prescriptions  Medication Sig Dispense Refill  . amLODipine (NORVASC) 5 MG tablet Take 5 mg by mouth daily.    .Marland Kitchenaspirin 81 MG tablet Take 81 mg by mouth daily.    .Marland Kitchenatorvastatin (LIPITOR) 80 MG tablet Take 80 mg by mouth daily.    . cetirizine (ZYRTEC) 10 MG tablet Take 10 mg by mouth daily.    .Marland Kitchenezetimibe (ZETIA) 10 MG tablet Take 10 mg by mouth daily.    . Fe Fum-FePoly-Vit C-Vit B3 (INTEGRA PO) Take 1 tablet by mouth 2 (two) times daily.    .Marland KitchenFIBER FORMULA PO Take by mouth 2 (two) times daily.    .Marland KitchenglipiZIDE (GLUCOTROL) 10 MG tablet Take 10 mg by mouth 2 (two) times daily.    . insulin aspart (NOVOLOG) 100 UNIT/ML injection Inject 25 Units into the skin once. Before supper    . insulin aspart (NOVOLOG)  100 UNIT/ML injection Inject 74 Units into the skin once.    . insulin aspart (NOVOLOG) 100 UNIT/ML injection Inject 15 Units into the skin daily at 12 noon.    . insulin aspart (NOVOLOG) 100 UNIT/ML injection Inject 20 Units into the skin daily before breakfast.     . insulin glargine (LANTUS) 100 UNIT/ML injection Inject 65 Units into the skin at bedtime.    . Liraglutide (VICTOZA [place]) Inject 1.2 Units into the skin daily.    Marland Kitchen lisinopril (PRINIVIL,ZESTRIL) 20 MG tablet Take 20 mg by mouth daily.    Marland Kitchen loratadine (CLARITIN) 10 MG tablet Take 10 mg by mouth daily.    . metFORMIN (GLUCOPHAGE) 500 MG tablet Take 1,000 mg by mouth 2 (two) times daily with a meal.     . metoprolol (TOPROL-XL) 200 MG 24 hr tablet Take 200 mg by mouth daily.    . Multiple Vitamins-Minerals  (MULTIVITAMIN WITH MINERALS) tablet Take 1 tablet by mouth daily.    . pantoprazole (PROTONIX) 20 MG tablet Take 40 mg by mouth daily.     . pregabalin (LYRICA) 75 MG capsule Take 75 mg by mouth 2 (two) times daily.    . sildenafil (VIAGRA) 100 MG tablet Take 100 mg by mouth as needed for erectile dysfunction.    Marland Kitchen spironolactone (ALDACTONE) 25 MG tablet Take 25 mg by mouth 2 (two) times daily.    Marland Kitchen triamcinolone cream (KENALOG) 0.1 % Apply 1 application topically 2 (two) times daily.    . valsartan-hydrochlorothiazide (DIOVAN-HCT) 320-25 MG per tablet Take 1 tablet by mouth daily.     No current facility-administered medications for this visit.    Review of Systems:  GENERAL:  Feels great.  Active.  No fevers, sweats or weight loss. PERFORMANCE STATUS (ECOG):  0 HEENT:  No visual changes, runny nose, sore throat, mouth sores or tenderness. Lungs: No shortness of breath or cough.  No hemoptysis. Cardiac:  No chest pain, palpitations, orthopnea, or PND. GI:  No nausea, vomiting, diarrhea, constipation, melena or hematochezia. GU:  No urgency, frequency, dysuria, or hematuria. 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  Low back pain secondary to "old age and sports".  No muscle tenderness. Extremities:  No pain or swelling. Skin:  No rashes or skin changes. Neuro:  No headache, numbness or weakness, balance or coordination issues. Endocrine:  No diabetes, thyroid issues, hot flashes or night sweats. Psych:  No mood changes, depression or anxiety. Pain:  No focal pain. Review of systems:  All other systems reviewed and found to be negative.  Physical Exam: Blood pressure 151/85, pulse 77, temperature 97.5 F (36.4 C), temperature source Tympanic, resp. rate 20, height _0  (1.702 m), weight 218 lb 0.6 oz (98.9 kg).  GENERAL:  Well developed, well nourished, sitting comfortably in the exam room in no acute distress. MENTAL STATUS:  Alert and oriented to person, place and time. HEAD:  Short hair.  Graying goatee.  Normocephalic, atraumatic, face symmetric, no Cushingoid features. EYES:  Glasses. Blue eyes.  Pupils equal round and reactive to light and accomodation.  No conjunctivitis or scleral icterus. ENT:  Oropharynx clear without lesion.  Partial metal upper plate.  Tongue normal. Mucous membranes moist.  RESPIRATORY:  Clear to auscultation without rales, wheezes or rhonchi. CARDIOVASCULAR:  Regular rate and rhythm without murmur, rub or gallop. ABDOMEN:  Soft, non-tender, with active bowel sounds, and no hepatosplenomegaly.  No masses. SKIN:  No rashes, ulcers or lesions. EXTREMITIES:  Chronic lower extremity changes.  No palpable cords. LYMPH NODES: No palpable cervical, supraclavicular, axillary or inguinal adenopathy  NEUROLOGICAL: Unremarkable. PSYCH:  Appropriate.   No visits with results within 3 Day(s) from this visit. Latest known visit with results is:  Office Visit on 04/15/2013  Component Date Value Ref Range Status  . Fecal Occult Blood, POC 04/15/2013 Negative   Final    Assessment:  Roger Knight is a 64 y.o. male with stage III chronic kidney disease with assocaited diabetes and hypertension.  SPEP on 09/14/2014 revealed an M-spike of 0.2 gm/dL.  Calcium is normal.    He has a mild microcytic anemia (HCT 31.4 with an MCV of 79).  He had a colonoscopy and EGD in 2014.  Diet is good.  He denies any melena or hematochezia.   Plan: 1. Discuss monoclonal gammopathy and differential diagnosis (MGUS versus lymphoproliferative disorder such as multiple myeloma).  Discuss additional laboratory testing (blood and urine). 2. Discuss anemia and differential diagnosis. 3. Labs tomorrow:  SPEP, free light chain assay, serum immunoglobulins (IgG, IgA, IgM), ferritin, iron studies, B12, folate, TSH, and retic. 4. Collect 24 hour urine for UPEP and free light chains. 5. RTC in 1 week to discuss results.   Lequita Asal, MD  10/14/2014, 5:28 PM

## 2014-10-15 LAB — PROTEIN ELECTROPHORESIS, SERUM
A/G Ratio: 1 (ref 0.7–1.7)
Albumin ELP: 3 g/dL (ref 2.9–4.4)
Alpha-1-Globulin: 0.2 g/dL (ref 0.0–0.4)
Alpha-2-Globulin: 1.2 g/dL — ABNORMAL HIGH (ref 0.4–1.0)
Beta Globulin: 0.8 g/dL (ref 0.7–1.3)
Gamma Globulin: 0.8 g/dL (ref 0.4–1.8)
Globulin, Total: 3 g/dL (ref 2.2–3.9)
Total Protein ELP: 6 g/dL (ref 6.0–8.5)

## 2014-10-16 LAB — KAPPA/LAMBDA LIGHT CHAINS
Kappa free light chain: 61.65 mg/L — ABNORMAL HIGH (ref 3.30–19.40)
Kappa, lambda light chain ratio: 2.19 — ABNORMAL HIGH (ref 0.26–1.65)
Lambda free light chains: 28.09 mg/L — ABNORMAL HIGH (ref 5.71–26.30)

## 2014-10-17 LAB — IGG, IGA, IGM
IgA: 173 mg/dL (ref 61–437)
IgG (Immunoglobin G), Serum: 702 mg/dL (ref 700–1600)
IgM, Serum: 156 mg/dL (ref 20–172)

## 2014-10-19 ENCOUNTER — Other Ambulatory Visit: Payer: Self-pay

## 2014-10-19 ENCOUNTER — Other Ambulatory Visit: Payer: Self-pay | Admitting: *Deleted

## 2014-10-19 DIAGNOSIS — D472 Monoclonal gammopathy: Secondary | ICD-10-CM

## 2014-10-20 LAB — UPEP/TP, 24-HR URINE
Albumin, U: 75 %
Alpha 1, Urine: 5.2 %
Alpha 2, Urine: 3.6 %
Beta, Urine: 10.8 %
Gamma Globulin, Urine: 5.4 %
Total Protein, Urine-Ur/day: 8506.4 mg/24 hr — ABNORMAL HIGH (ref 30.0–150.0)
Total Protein, Urine: 303.8 mg/dL
Total Volume: 2800

## 2014-10-25 IMAGING — US ABDOMEN ULTRASOUND
1 series · 14 of 25 positions shown · non-contrast
Comparison: None.

CLINICAL DATA: Abdominal pain, fever, abnormal LFTs

EXAM:
ULTRASOUND ABDOMEN COMPLETE

[Series 1: abdomen ultrasound · 0.31mm/px · 14 of 101 slices shown]
[im 1/101]
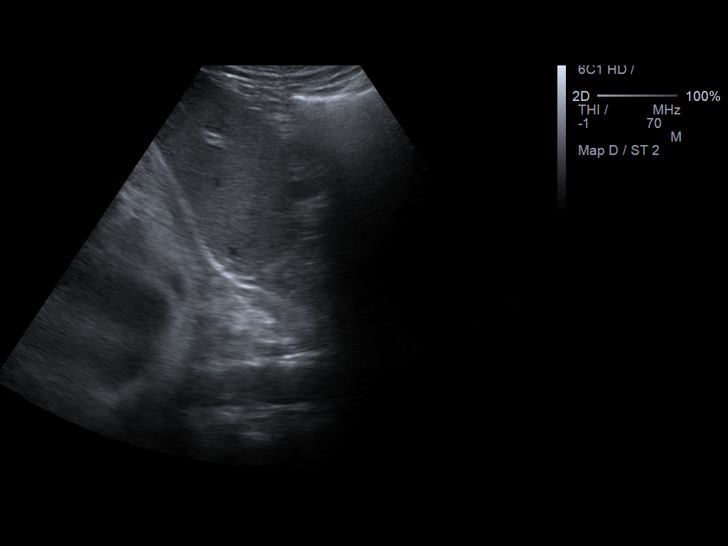
[im 9/101]
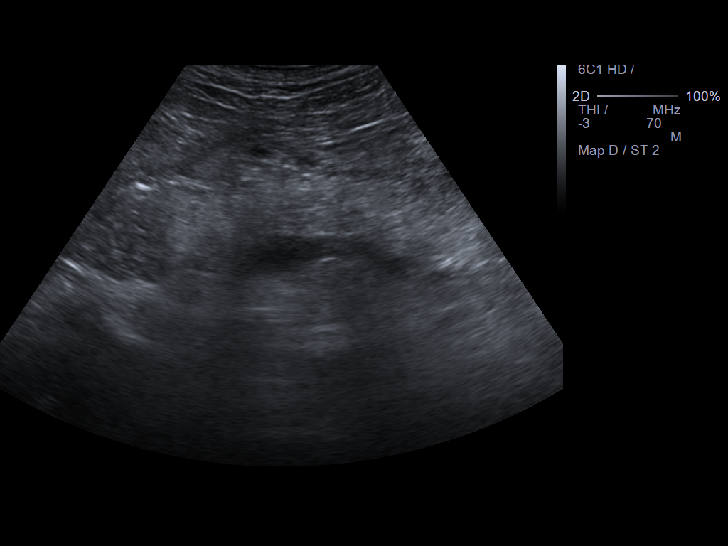
[im 17/101]
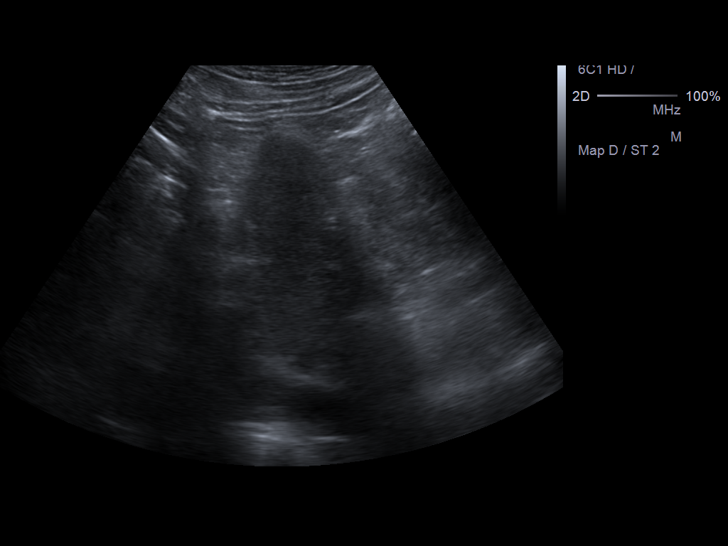
[im 26/101]
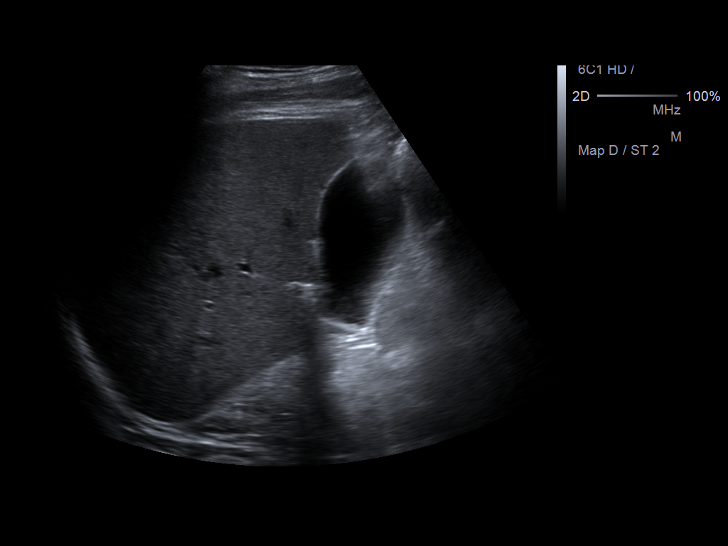
[im 34/101]
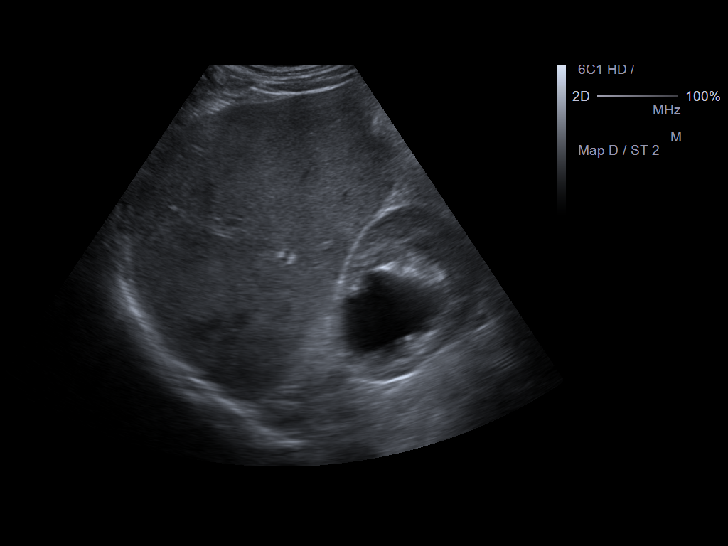
[im 38/101]
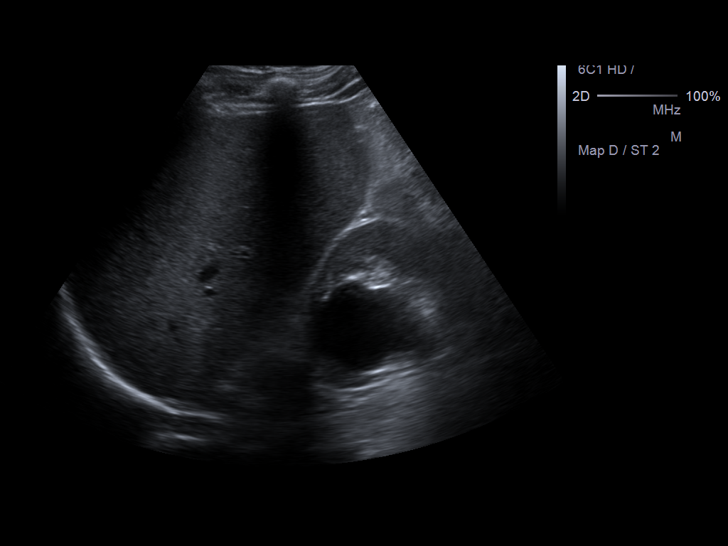
[im 46/101]
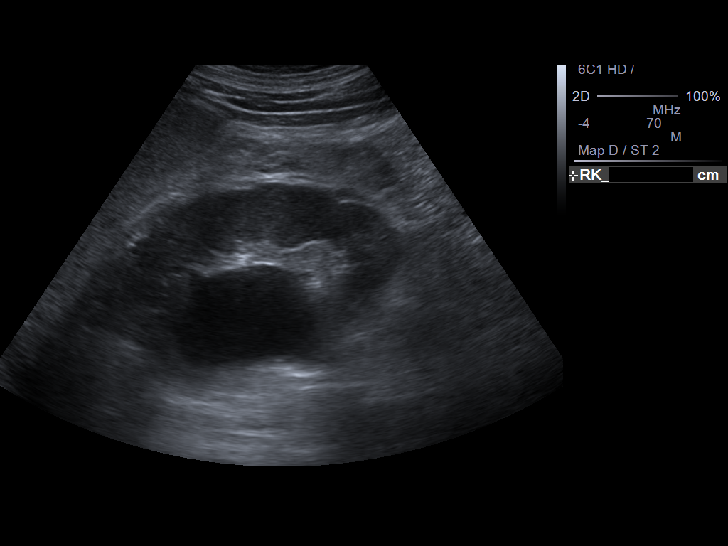
[im 55/101]
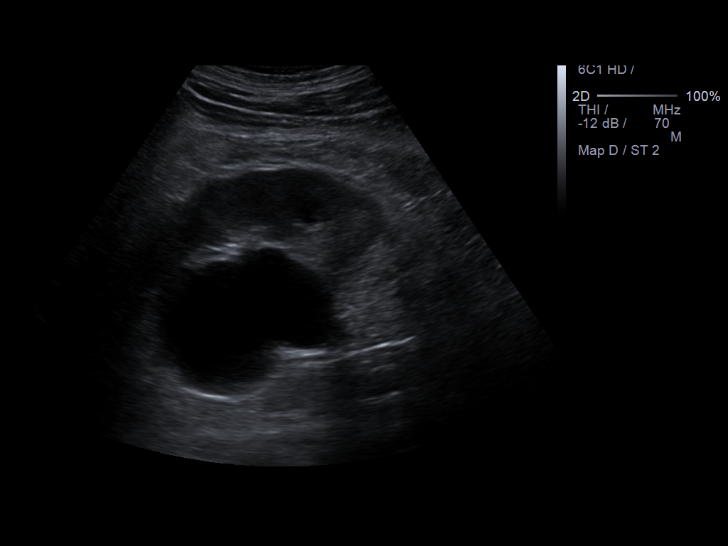
[im 63/101]
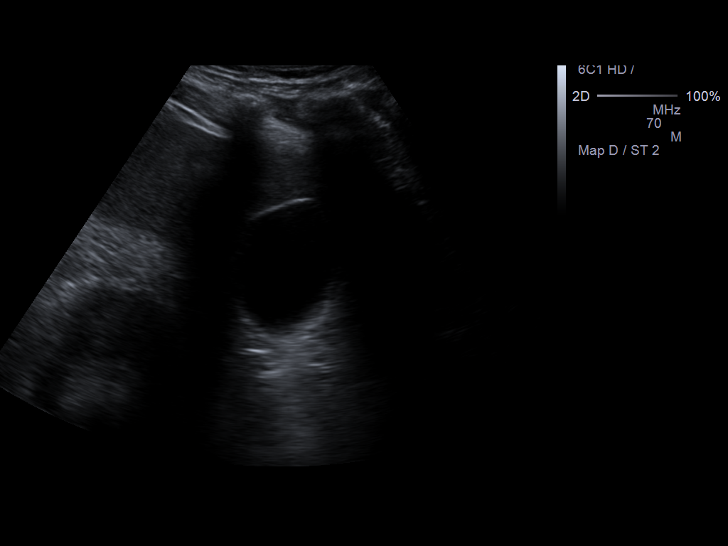
[im 67/101]
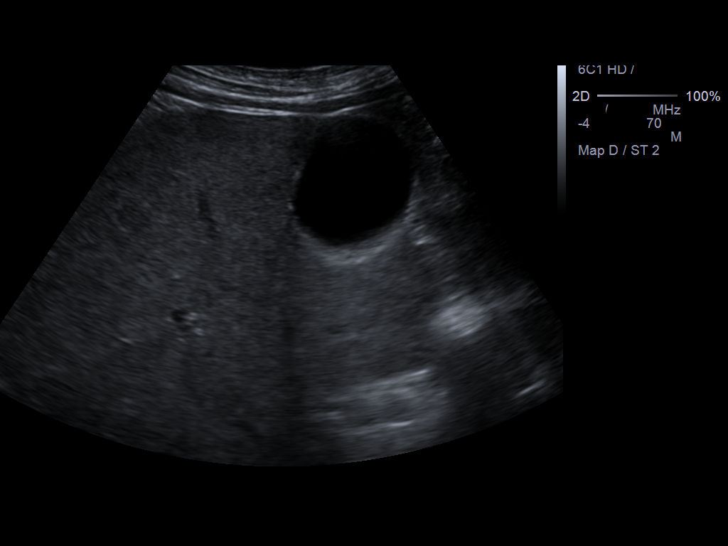
[im 76/101]
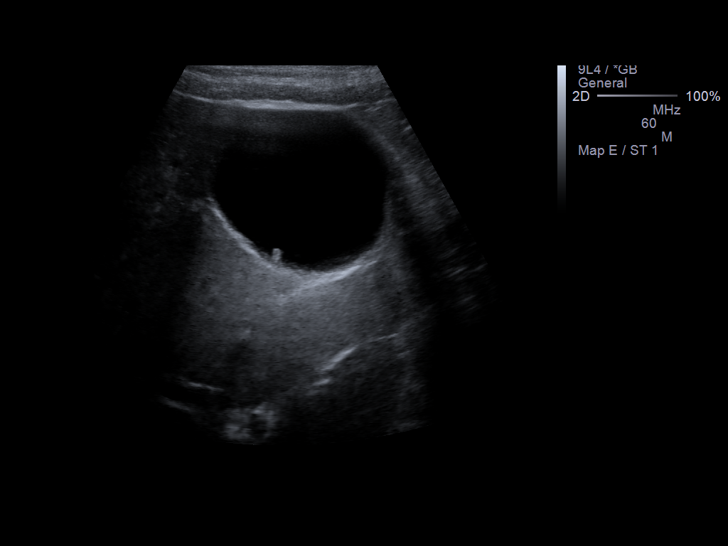
[im 84/101]
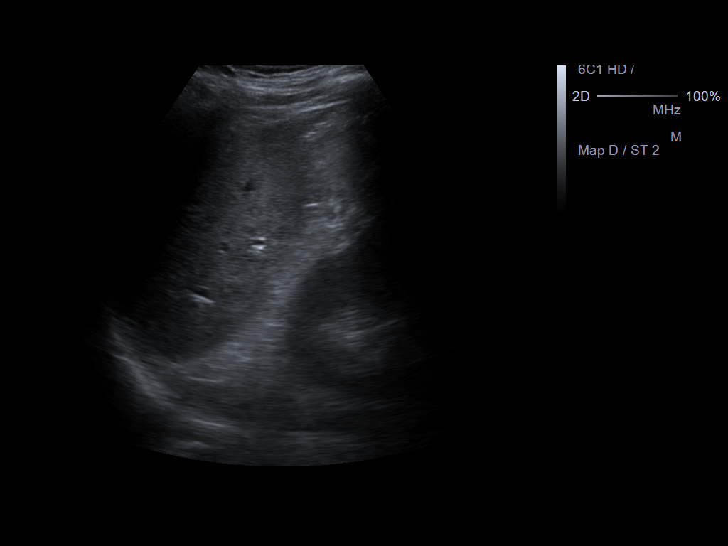
[im 92/101]
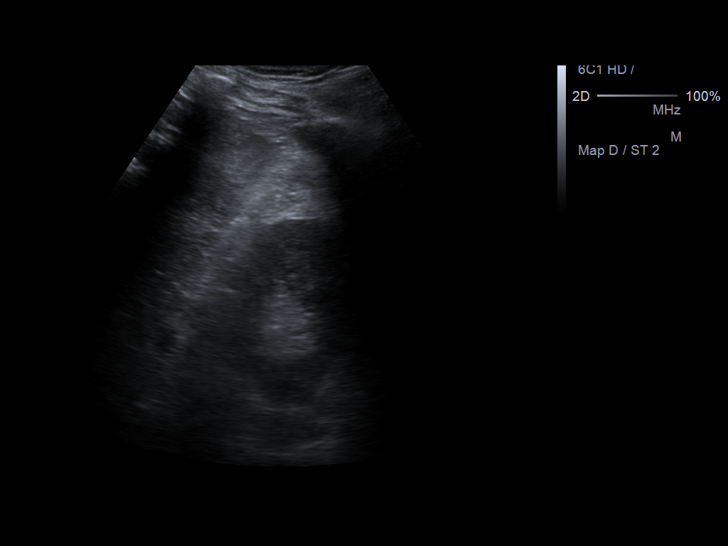
[im 101/101]
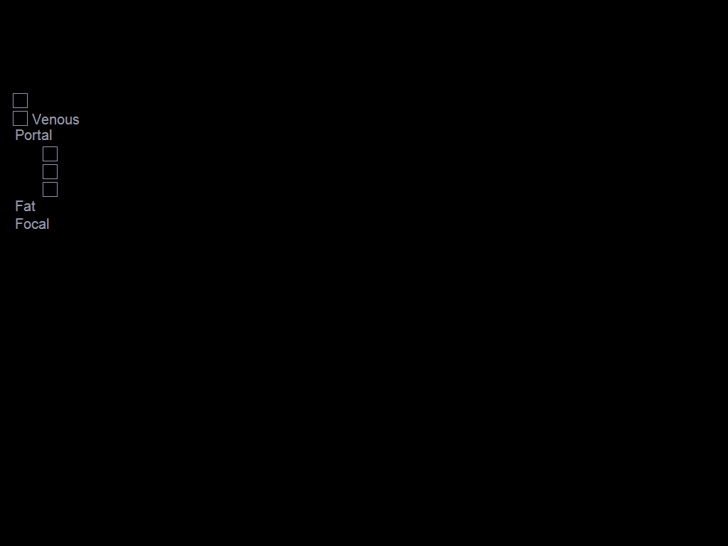

[14 of 25 positions shown; findings below may reference images not displayed]

FINDINGS: Gallbladder:

Suspected cholesterolosis of the gallbladder wall. Tiny gallstones
measuring up to 6 mm. No gallbladder wall thickening or
pericholecystic fluid. Negative sonographic Murphy's sign.

Common bile duct:

Diameter: 4 mm.

Liver:

At the upper limits of normal for parenchymal echogenicity. No focal
hepatic lesion is seen.

IVC:

No abnormality visualized.

Pancreas:

Visualized portions are unremarkable.

Spleen:

Measures 5.1 cm.

Right Kidney:

Length: 13.4 cm. 7.4 x 5.7 x 5.1 cm renal sinus cyst. No
hydronephrosis.

Left Kidney:

Length: 11.6 cm. 2.0 x 2.0 x 2.1 cm interpolar cyst. No
hydronephrosis.

Abdominal aorta:

No aneurysm visualized.

Other findings:

None.
IMPRESSION: Cholelithiasis, without associated findings to suggest acute
cholecystitis.

Bilateral renal cysts.

## 2014-10-26 ENCOUNTER — Encounter: Payer: Self-pay | Admitting: Hematology and Oncology

## 2014-10-27 ENCOUNTER — Inpatient Hospital Stay: Payer: 59 | Attending: Hematology and Oncology | Admitting: Hematology and Oncology

## 2014-10-27 VITALS — BP 129/78 | HR 90 | Temp 98.8°F | Resp 18 | Ht 67.0 in | Wt 218.3 lb

## 2014-10-27 DIAGNOSIS — R809 Proteinuria, unspecified: Secondary | ICD-10-CM | POA: Diagnosis not present

## 2014-10-27 DIAGNOSIS — Z79899 Other long term (current) drug therapy: Secondary | ICD-10-CM | POA: Diagnosis not present

## 2014-10-27 DIAGNOSIS — K219 Gastro-esophageal reflux disease without esophagitis: Secondary | ICD-10-CM | POA: Diagnosis not present

## 2014-10-27 DIAGNOSIS — E785 Hyperlipidemia, unspecified: Secondary | ICD-10-CM | POA: Insufficient documentation

## 2014-10-27 DIAGNOSIS — Z7982 Long term (current) use of aspirin: Secondary | ICD-10-CM | POA: Insufficient documentation

## 2014-10-27 DIAGNOSIS — Z85828 Personal history of other malignant neoplasm of skin: Secondary | ICD-10-CM | POA: Diagnosis not present

## 2014-10-27 DIAGNOSIS — E119 Type 2 diabetes mellitus without complications: Secondary | ICD-10-CM | POA: Insufficient documentation

## 2014-10-27 DIAGNOSIS — D509 Iron deficiency anemia, unspecified: Secondary | ICD-10-CM | POA: Diagnosis not present

## 2014-10-27 DIAGNOSIS — G629 Polyneuropathy, unspecified: Secondary | ICD-10-CM | POA: Insufficient documentation

## 2014-10-27 DIAGNOSIS — Z794 Long term (current) use of insulin: Secondary | ICD-10-CM | POA: Insufficient documentation

## 2014-10-27 DIAGNOSIS — M129 Arthropathy, unspecified: Secondary | ICD-10-CM | POA: Insufficient documentation

## 2014-10-27 DIAGNOSIS — I129 Hypertensive chronic kidney disease with stage 1 through stage 4 chronic kidney disease, or unspecified chronic kidney disease: Secondary | ICD-10-CM | POA: Insufficient documentation

## 2014-10-27 DIAGNOSIS — N183 Chronic kidney disease, stage 3 unspecified: Secondary | ICD-10-CM | POA: Insufficient documentation

## 2014-10-27 DIAGNOSIS — K449 Diaphragmatic hernia without obstruction or gangrene: Secondary | ICD-10-CM | POA: Insufficient documentation

## 2014-10-27 DIAGNOSIS — I10 Essential (primary) hypertension: Secondary | ICD-10-CM | POA: Diagnosis not present

## 2014-10-27 DIAGNOSIS — R779 Abnormality of plasma protein, unspecified: Secondary | ICD-10-CM | POA: Insufficient documentation

## 2014-10-27 DIAGNOSIS — E1142 Type 2 diabetes mellitus with diabetic polyneuropathy: Secondary | ICD-10-CM | POA: Insufficient documentation

## 2014-10-27 DIAGNOSIS — E1169 Type 2 diabetes mellitus with other specified complication: Secondary | ICD-10-CM | POA: Insufficient documentation

## 2014-10-27 DIAGNOSIS — I2510 Atherosclerotic heart disease of native coronary artery without angina pectoris: Secondary | ICD-10-CM | POA: Insufficient documentation

## 2014-10-27 DIAGNOSIS — Z87891 Personal history of nicotine dependence: Secondary | ICD-10-CM | POA: Insufficient documentation

## 2014-10-27 DIAGNOSIS — D472 Monoclonal gammopathy: Secondary | ICD-10-CM

## 2014-10-27 NOTE — Progress Notes (Signed)
[facility] day:  10/27/2014  Chief Complaint: Roger Knight is a 64 y.o. male with stage III chronic kidney disease and an abnormal SPEP who is seen for review of interval studies and discussion regarding direction of therapy.  HPI:  The patient was last seen in the medical oncology clinic on 10/13/2014.  At that time, who was seen in consultation regarding an abnormal SPEP.  Outside serum protein electrophoresis revealed an M spike of 0.2 g/dL. CBC included a hematocrit 31.4, hemoglobin 9.8, MCV 79, platelets 278,000, and white count 7200 with an ANC of 4800.  Creatinine was 1.87.  Total protein was 5.8 with an albumin of 3.5. Calcium was 9.1. Random urine protein electrophoresis revealed 803 mg/dL protein.  Urine protein electrophoresis was not sufficient for M spike analysis.  He denied any recurrent infections or bone pain. He has felt great for the past 20 years.  He underwent a work-up on 10/14/2014.  SPEP revealed no monoclonal protein.  Immunoglobulin levels (IgG, IgA, IgM) were normal.  Kappa free light chains were 61.35, lambda free light chains 28.09 with a ratio of 2.19 (.26 - 1.65).  Retic was 2.2%.  B12 was 311 and folate 43 (both normal).  TSH was 1.913 (normal).  24 hour urine revealed 8.5 grams of protein.  Free light chains on the 24 hour urine were inadvertantly not done.    Past Medical History  Diagnosis Date  . Arthritis   . GERD (gastroesophageal reflux disease)   . Diabetes mellitus without complication   . Hernia   . Anemia   . Hyperlipidemia   . Hypertension   . Carcinoma 2012     skin cancer on neck [facility]  . Chronic kidney disease 2016  . Skin cancer     Past Surgical History  Procedure Laterality Date  . Coronary artery bypass graft  1996    [facility]  . Coronary stent placement  2011    [facility]  . Colonoscopy  April 2014  . Upper gi endoscopy  2014    Family History  Problem Relation Age of Onset  . Dementia  Mother 17  . Osteoarthritis Mother   . Cancer Other     skin cancer    Social History:  reports that he quit smoking about 9 years ago. His smoking use included Cigarettes. He has quit using smokeless tobacco. His smokeless tobacco use included Chew. He reports that he does not drink alcohol or use illicit drugs.  The patient is alone today.  Allergies: No Known Allergies  Current Medications: Current Outpatient Prescriptions  Medication Sig Dispense Refill  . amLODipine (NORVASC) 5 MG tablet Take 5 mg by mouth daily.    Marland Kitchen aspirin 81 MG tablet Take 81 mg by mouth daily.    Marland Kitchen atorvastatin (LIPITOR) 80 MG tablet Take 80 mg by mouth daily.    . cetirizine (ZYRTEC) 10 MG tablet Take 10 mg by mouth daily.    Marland Kitchen ezetimibe (ZETIA) 10 MG tablet Take 10 mg by mouth daily.    . Fe Fum-FePoly-Vit C-Vit B3 (INTEGRA PO) Take 1 tablet by mouth 2 (two) times daily.    Marland Kitchen FIBER FORMULA PO Take by mouth 2 (two) times daily.    Marland Kitchen glipiZIDE (GLUCOTROL) 10 MG tablet Take 10 mg by mouth 2 (two) times daily.    Marland Kitchen glucose blood (BAYER CONTOUR TEST) test strip Use 1 strip via meter three times a day    .  insulin aspart (NOVOLOG) 100 UNIT/ML injection Inject 25 Units into the skin once. Before supper    . insulin aspart (NOVOLOG) 100 UNIT/ML injection Inject 74 Units into the skin once.    . insulin aspart (NOVOLOG) 100 UNIT/ML injection Inject 15 Units into the skin daily at 12 noon.    . insulin aspart (NOVOLOG) 100 UNIT/ML injection Inject 20 Units into the skin daily before breakfast.     . insulin glargine (LANTUS) 100 UNIT/ML injection Inject 65 Units into the skin at bedtime.    . Liraglutide (VICTOZA [place]) Inject 1.2 Units into the skin daily.    Marland Kitchen lisinopril (PRINIVIL,ZESTRIL) 20 MG tablet Take 20 mg by mouth daily.    Marland Kitchen loratadine (CLARITIN) 10 MG tablet Take 10 mg by mouth daily.    . metFORMIN (GLUCOPHAGE) 500 MG tablet Take 1,000 mg by mouth 2 (two) times daily with a meal.     . metoprolol  (TOPROL-XL) 200 MG 24 hr tablet Take 200 mg by mouth daily.    . Multiple Vitamins-Minerals (MULTIVITAMIN WITH MINERALS) tablet Take 1 tablet by mouth daily.    . pantoprazole (PROTONIX) 20 MG tablet Take 40 mg by mouth daily.     . pregabalin (LYRICA) 75 MG capsule Take 75 mg by mouth 2 (two) times daily.    . sildenafil (VIAGRA) 100 MG tablet Take 100 mg by mouth as needed for erectile dysfunction.    Marland Kitchen spironolactone (ALDACTONE) 25 MG tablet Take 25 mg by mouth 2 (two) times daily.    Marland Kitchen triamcinolone cream (KENALOG) 0.1 % Apply 1 application topically 2 (two) times daily.    . valsartan-hydrochlorothiazide (DIOVAN-HCT) 320-25 MG per tablet Take 1 tablet by mouth daily.     No current facility-administered medications for this visit.    Review of Systems:  GENERAL:  Feels great.  Active.  No fevers, sweats or weight loss. PERFORMANCE STATUS (ECOG):  0 HEENT:  No visual changes, runny nose, sore throat, mouth sores or tenderness. Lungs: No shortness of breath or cough.  No hemoptysis. Cardiac:  No chest pain, palpitations, orthopnea, or PND. GI:  No nausea, vomiting, diarrhea, constipation, melena or hematochezia. GU:  No urgency, frequency, dysuria, or hematuria. 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  Low back pain secondary to "old age and sports".  No muscle tenderness. Extremities:  No pain or swelling. Skin:  No rashes or skin changes. Neuro:  No headache, numbness or weakness, balance or coordination issues. Endocrine:  Diabetes.  No thyroid issues, hot flashes or night sweats. Psych:  No mood changes, depression or anxiety. Pain:  No focal pain. Review of systems:  All other systems reviewed and found to be negative.  Physical Exam: Blood pressure 129/78, pulse 90, temperature 98.8 F (37.1 C), temperature source Tympanic, resp. rate 18, height 5\' 7"  (1.702 m), weight 218 lb 4.1 oz (99 kg).  GENERAL:  Well developed, well nourished, sitting comfortably in the exam room  in no acute distress. MENTAL STATUS:  Alert and oriented to person, place and time. HEAD: Short hair.  Graying goatee.  Normocephalic, atraumatic, face symmetric, no Cushingoid features. EYES:  Glasses. Blue eyes. No conjunctivitis or scleral icterus. EXTREMITIES:  Chronic lower extremity changes. NEUROLOGICAL: Unremarkable. PSYCH:  Appropriate.   No visits with results within 3 Day(s) from this visit. Latest known visit with results is:  Orders Only on 10/19/2014  Component Date Value Ref Range Status  . Total Protein, Urine 10/18/2014 303.8  Not Estab. mg/dL Final  Comment: (NOTE) Results confirmed on dilution.   . Total Protein, Urine-Ur/day 10/18/2014 8506.4* 30.0 - 150.0 mg/24 hr Final  . Albumin, U 10/18/2014 75.0   Final  . Alpha 1, Urine 10/18/2014 5.2   Final  . Alpha 2, Urine 10/18/2014 3.6   Final  . Beta, Urine 10/18/2014 10.8   Final  . Gamma Globulin, Urine 10/18/2014 5.4   Final  . M-spike, % 10/18/2014 Not Observed  Not Observed % Final  . PLEASE NOTE: 10/18/2014 Comment   Final   Comment: (NOTE) Protein electrophoresis scan will follow via computer, mail, or courier delivery. Performed At: [facility] [place], [place] [id number] Lindon Romp MD [id number]   . Total Volume 10/18/2014 2800   Final    Assessment:  DRILON USSERY is a 64 y.o. male with stage III chronic kidney disease with assocaited diabetes and hypertension.  SPEP on 09/14/2014 revealed an M-spike of 0.2 gm/dL.  Calcium is normal.    Work-up on 10/14/2014 revealed no monoclonal protein on SPEP.  Immunoglobulin levels (IgG, IgA, IgM) were normal.  Kappa free light chains were 61.35, lambda free light chains 28.09 with a ratio of 2.19 (.26 - 1.65).  He has nephrotic range proteinuria with 8.4 gms of protein in 24 hours.  Free light chains on the 24 hour urine were inadvertantly not done.   He has a mild microcytic anemia (HCT 31.4 with an MCV of 79).   Ferritin was 6 with an iron saturation of 9% (low) consistent with iron deficiency anemia.   Retic was 2.2%.  B12, folate, and TSH were normal.   He had an EGD and colonoscopy in 2014.  Diet is good.  He denies any melena or hematochezia.   Plan: 1. Discuss work-up.  Discuss no monoclonal protein in blood.  Discuss concern for nephrotic range proteinuria (8.5 gms/day). 2. Re-collect 24 hour urine for free light chains. 3. Contact patient with results. 4. Follow-up with PCP regarding iron deficiency anemia.   5. RTC prn unless free light chains elevated.   Lequita Asal, MD  10/27/2014, 4:24 PM

## 2014-10-28 ENCOUNTER — Telehealth: Payer: Self-pay

## 2014-10-28 NOTE — Telephone Encounter (Signed)
Spoke with pt and instructed he needed to collect another 24 hour urine for kappa/lamda light chains as [facility] no longer had urine sample from previous collection therefore could not add this lab on; Kim notified in lab that pt would come pick up 24 hour urine container; pt verbalized understanding of this

## 2014-11-02 ENCOUNTER — Encounter: Payer: Self-pay | Admitting: Hematology and Oncology

## 2014-11-16 ENCOUNTER — Other Ambulatory Visit: Payer: Self-pay

## 2014-11-16 DIAGNOSIS — E1149 Type 2 diabetes mellitus with other diabetic neurological complication: Secondary | ICD-10-CM | POA: Insufficient documentation

## 2014-11-16 DIAGNOSIS — E1165 Type 2 diabetes mellitus with hyperglycemia: Secondary | ICD-10-CM

## 2014-11-16 NOTE — Patient Outreach (Signed)
[facility]) Care Management  11/16/2014  Roger Knight 13-Jan-1951 [id number]   Spoke with Roger Knight by phone today regarding recent MD visits, tests and blood sugar control.  Roger Knight saw Dr. Holley Raring who ordered numerous labs/urine tests.  Roger Knight was sent to the cancer center to meet with a hematologist - although he has protein in his urine and blood and Roger Knight tells me this is a pre-cancerous sign, MD has no current concerns for his health.  Roger Knight tells me the protein has been present for about 10 years.   Roger Knight is on Vitamin D3 50,000 IU which he tells me the doctor has put him on for 3 months.  He will take his second dose at the beginning of August.    Roger Knight reports his average blood sugar over the past 60 days is 126m/dl.  He will have his A1C re-checked when he sees Dr. SGabriel Carinain 2 weeks.  A1C on 08/11/14 was 7.6%. He had 1 episode of hypoglycemia on 11/14/14 at around 12:30am- he was in bed watching TV- he got up and felt odd- he checked his sugar-  blood sugar was 478mdl.  He informs me he was very active that day (Saturday) and has not had any other episodes.   He has not met his goal of 150 minutes of exercise/month because he has been doing some house remodeling and is too tired to fit in exercise. Will continue to work towards this.   JuGentry FitzRN, BA, MHNyeCDFuller Acresirect Dial:  [phone]:  [phone]-mail: [email or web address] [address]  [id number]

## 2014-12-21 ENCOUNTER — Other Ambulatory Visit: Payer: Self-pay

## 2014-12-21 NOTE — Patient Outreach (Signed)
[facility]) Care Management  12/21/2014  Roger Knight [birth year] [id number]   Patient sent me an e-mail telling me he saw Dr. Gabriel Carina on 11/25/14.  BP was 150/72, weight 216 and A1C was 7.1%.  Tells me MD was very pleased.    [name], RN, BA, [place], [place] Direct Dial:  [phone]  Fax:  [phone] E-mail: [email or web address] [address] [place],   [address]

## 2014-12-23 ENCOUNTER — Other Ambulatory Visit: Payer: Self-pay

## 2014-12-23 NOTE — Patient Outreach (Addendum)
[facility]) Care Management  12/23/2014  BERGER STAVER 09/18/1950 [id number]  Spoke to [place] by phone. He tells he he had two episodes of hypoglycemia- one which required EMS on 12/17/14 and one on 8/28th.   On the 25th, his wife called EMS- CBG 40mg /dl.  On the 28th, he was busy working around the house and ate a tablespoon of peanut butter at 1:30pm.  He continued to work then went to [employer] to do some shopping.  He does not remember his experience at [facility] but his wife told him he began raising his voice, was staggering, and had to sit down for a short period.  Once they got home, he checked his sugar.  It was 30mg /dl.    I emphasized the need to eat about every 3-4 hours and the importance of carbs and proteins with meals.  He may need to discuss with his MD, what medication modifications he needs to make if he is going to exercise excessively.   I have emphasized the need to follow the dietary guidelines since his A1C is now 7.1%- he is much more likely to have a low blood sugar since his blood sugars are so much closer to 70mg /dl than they were when his A1C was elevated.     [name], RN, BA, [place], [place] Direct Dial:  [phone]  Fax:  [phone] E-mail: [email or web address] [address] [place], [place]  [address]

## 2015-01-20 ENCOUNTER — Other Ambulatory Visit: Payer: Self-pay

## 2015-01-22 NOTE — Patient Outreach (Signed)
[place] [facility]) Care Management  01/22/2015  [name] 1951-02-24 [id number]   Left message with patient to get an update on his diabetes and inquire about any recent hypoglycemia.   [name], RN, BA, [place], [place] Direct Dial:  [phone]  Fax:  [phone] E-mail: [email or web address] [address], [place], [place]  [address]

## 2015-01-28 NOTE — Patient Outreach (Signed)
[facility]) Care Management  01/28/2015  Roger Knight [birth year] [id number]

## 2015-02-01 ENCOUNTER — Other Ambulatory Visit: Payer: Self-pay

## 2015-02-01 NOTE — Patient Outreach (Signed)
[name]) Care Management  02/01/2015  Roger Knight 1950-07-14 [id number]  Spoke to patient by phone.  He has not had any episodes of hypoglycemia that caused him to pass out- he has had 2 episodes of hypoglycemia 30mg /dl and 40 mg/dl- he cannot figure out why they happened.  He is trying to do a better job of eating on a regular basis.   Tells me fasting blood sugars are running 90-100mg /dl.    A1C 7.1%  Saw Dr. Holley Raring- Systolic BP 20 points higher than normal - about 171/82 - MD doubled his Norvasc to 10mg  day  Follow up with Dr. Ola Spurr / Dr. Ubaldo Glassing on October 28th, 2016.    [name], RN, BA, [place], [place] Direct Dial:  [phone]  Fax:  [phone] E-mail: [email or web address] [address] [place], [place]  [address]

## 2015-03-15 ENCOUNTER — Other Ambulatory Visit: Payer: Self-pay

## 2015-03-15 VITALS — BP 150/74 | Ht 67.0 in | Wt 208.8 lb

## 2015-03-15 DIAGNOSIS — E118 Type 2 diabetes mellitus with unspecified complications: Secondary | ICD-10-CM

## 2015-03-15 DIAGNOSIS — Z794 Long term (current) use of insulin: Principal | ICD-10-CM

## 2015-03-15 NOTE — Patient Outreach (Signed)
[facility]) Care Management  [place]  03/15/2015   Roger Knight [birth year] [id number]  Subjective: Patient in today for his regularly scheduled Link to Wellness visit.  He reports having a severe low blood sugar on Thursday, March 06, 2015 after eating supper which included bread, baked potato and sweet tea. He required EMS intervention and IV glucose.  He reported it to Dr. Gabriel Carina.  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 He did not bring his meter with him to my appointment but reports fasting blood sugars of 85-95mg /dl and 2 hr. Post prandial blood sugars 165-190mg /dl. He does not carry glucose tablets or candies with him but reports having candies "all over the department".  Objective:  Filed Vitals:   03/15/15 1549  BP: 150/74  Height: 1.702 m (5\' 7" )  Weight: 208 lb 12.8 oz (94.711 kg)   Current Medications:  Current Outpatient Prescriptions  Medication Sig Dispense Refill  . amLODipine (NORVASC) 5 MG tablet Take 10 mg by mouth daily.     Marland Kitchen aspirin 81 MG tablet Take 81 mg by mouth daily.    Marland Kitchen atorvastatin (LIPITOR) 80 MG tablet Take 80 mg by mouth daily.    . cetirizine (ZYRTEC) 10 MG tablet Take 10 mg by mouth daily.    . Cholecalciferol (VITAMIN D3) 50000 UNITS TABS Take 1 tablet by mouth every 30 (thirty) days.    Marland Kitchen ezetimibe (ZETIA) 10 MG tablet Take 10 mg by mouth daily.    . ferrous fumarate (HEMOCYTE - 106 MG FE) 325 (106 FE) MG TABS tablet Take 1 tablet by mouth.    . FIBER FORMULA PO Take by mouth 2 (two) times daily.    Marland Kitchen glipiZIDE (GLUCOTROL) 10 MG tablet Take 10 mg by mouth 2 (two) times daily.    Marland Kitchen glucose blood (BAYER CONTOUR TEST) test strip Use 1 strip via meter three  times a day    . insulin aspart (NOVOLOG) 100 UNIT/ML injection Inject 25 Units into the skin once. Before supper    . insulin aspart (NOVOLOG) 100 UNIT/ML injection Inject 15 Units into the skin daily at 12 noon.    . insulin aspart (NOVOLOG) 100 UNIT/ML injection Inject 20 Units into the skin daily before breakfast.     . insulin glargine (LANTUS) 100 UNIT/ML injection Inject 65 Units into the skin at bedtime.    . Liraglutide (VICTOZA [place]) Inject 1.2 Units into the skin daily.    Marland Kitchen loratadine (CLARITIN) 10 MG tablet Take 10 mg by mouth daily.    . metFORMIN (GLUCOPHAGE) 500 MG tablet Take 1,000 mg by mouth 2 (two) times daily with a meal.     . metoprolol (TOPROL-XL) 200 MG 24 hr tablet Take 200 mg by mouth daily.    . Multiple Vitamins-Minerals (MULTIVITAMIN WITH MINERALS) tablet Take 1 tablet by mouth daily.    . pantoprazole (PROTONIX) 20 MG tablet Take 40 mg by mouth daily.     . pregabalin (LYRICA) 75 MG capsule Take 75 mg by mouth 2 (two) times daily. Usually takes 1 before bedtime only    . sildenafil (VIAGRA) 100 MG tablet Take 100 mg by mouth as needed for erectile dysfunction.    Marland Kitchen  spironolactone (ALDACTONE) 25 MG tablet Take 25 mg by mouth 2 (two) times daily.    Marland Kitchen triamcinolone cream (KENALOG) 0.1 % Apply 1 application topically 2 (two) times daily.    . valsartan-hydrochlorothiazide (DIOVAN-HCT) 320-25 MG per tablet Take 1 tablet by mouth daily.    . Fe Fum-FePoly-Vit C-Vit B3 (INTEGRA PO) Take 1 tablet by mouth 2 (two) times daily.    . insulin aspart (NOVOLOG) 100 UNIT/ML injection Inject 74 Units into the skin once.    Marland Kitchen lisinopril (PRINIVIL,ZESTRIL) 20 MG tablet Take 20 mg by mouth daily.     No current facility-administered medications for this visit.    Functional Status:  In your present state of health, do you have any difficulty performing the following activities: 09/15/2014  Hearing? N  Vision? N  Difficulty concentrating or making decisions? N  Walking or  climbing stairs? N  Dressing or bathing? N  Doing errands, shopping? N    Fall/Depression Screening: PHQ 2/9 Scores 03/15/2015 09/15/2014  PHQ - 2 Score 0 0    Assessment: Patient attends all his MD visits but is not exercising. He did not bring his meter with him to this appointment and is not carrying glucose with him.   Plan:  He needs to be more engaged/pro-active in caring for his diabetes. 1.  He needs to take his insulin to a restaurant so he can take it immediately before eating.  2. He needs to carry glucose with him at all times   3.  He needs to exercise- he set his goal to do 150 minutes /week.  [facility] CM Care Plan Problem One        Most Recent Value   Care Plan Problem One  patient is not meeting the national goal for exercise- 150 minutes/week   Role Documenting the Problem One  Care Management [place] for Problem One  Active   [facility] Long Term Goal (31-90 days)  Patient will exercise outside his job- 150 minutes per week by the next visit   [place] Start Date  03/15/15   Interventions for Problem One Long Term Goal  -- [continue to move towards 150 minutes]     Follow up in February, 2017   [name], RN, [place], [place], [place]:  [phone]  Fax:  [phone] E-mail: [email or web address] [address], [place], [place]  [address]

## 2015-03-25 ENCOUNTER — Encounter: Payer: Self-pay | Admitting: Emergency Medicine

## 2015-03-25 ENCOUNTER — Emergency Department
Admission: EM | Admit: 2015-03-25 | Discharge: 2015-03-25 | Disposition: A | Discharge status: Home / Self Care | Payer: 59 | Attending: Emergency Medicine | Admitting: Emergency Medicine

## 2015-03-25 ENCOUNTER — Other Ambulatory Visit: Payer: Self-pay

## 2015-03-25 DIAGNOSIS — N183 Chronic kidney disease, stage 3 (moderate): Secondary | ICD-10-CM | POA: Insufficient documentation

## 2015-03-25 DIAGNOSIS — Z79899 Other long term (current) drug therapy: Secondary | ICD-10-CM | POA: Diagnosis not present

## 2015-03-25 DIAGNOSIS — I129 Hypertensive chronic kidney disease with stage 1 through stage 4 chronic kidney disease, or unspecified chronic kidney disease: Secondary | ICD-10-CM | POA: Insufficient documentation

## 2015-03-25 DIAGNOSIS — Z7984 Long term (current) use of oral hypoglycemic drugs: Secondary | ICD-10-CM | POA: Diagnosis not present

## 2015-03-25 DIAGNOSIS — E11649 Type 2 diabetes mellitus with hypoglycemia without coma: Secondary | ICD-10-CM | POA: Diagnosis present

## 2015-03-25 DIAGNOSIS — R4182 Altered mental status, unspecified: Secondary | ICD-10-CM | POA: Insufficient documentation

## 2015-03-25 DIAGNOSIS — Z7982 Long term (current) use of aspirin: Secondary | ICD-10-CM | POA: Insufficient documentation

## 2015-03-25 DIAGNOSIS — E1151 Type 2 diabetes mellitus with diabetic peripheral angiopathy without gangrene: Secondary | ICD-10-CM | POA: Diagnosis not present

## 2015-03-25 DIAGNOSIS — Z87891 Personal history of nicotine dependence: Secondary | ICD-10-CM | POA: Diagnosis not present

## 2015-03-25 DIAGNOSIS — Z794 Long term (current) use of insulin: Secondary | ICD-10-CM | POA: Insufficient documentation

## 2015-03-25 DIAGNOSIS — R61 Generalized hyperhidrosis: Secondary | ICD-10-CM | POA: Insufficient documentation

## 2015-03-25 DIAGNOSIS — E162 Hypoglycemia, unspecified: Secondary | ICD-10-CM

## 2015-03-25 LAB — CBC WITH DIFFERENTIAL/PLATELET
BASOS ABS: 0.1 10*3/uL (ref 0–0.1)
Basophils Relative: 1 %
EOS PCT: 1 %
Eosinophils Absolute: 0.2 10*3/uL (ref 0–0.7)
HCT: 37.3 % — ABNORMAL LOW (ref 40.0–52.0)
Hemoglobin: 12.2 g/dL — ABNORMAL LOW (ref 13.0–18.0)
Lymphocytes Relative: 18 %
Lymphs Abs: 2.4 10*3/uL (ref 1.0–3.6)
MCH: 29.6 pg (ref 26.0–34.0)
MCHC: 32.9 g/dL (ref 32.0–36.0)
MCV: 90 fL (ref 80.0–100.0)
MONO ABS: 1.3 10*3/uL — AB (ref 0.2–1.0)
Monocytes Relative: 10 %
Neutro Abs: 9.3 10*3/uL — ABNORMAL HIGH (ref 1.4–6.5)
Neutrophils Relative %: 70 %
PLATELETS: 248 10*3/uL (ref 150–440)
RBC: 4.14 MIL/uL — ABNORMAL LOW (ref 4.40–5.90)
RDW: 14.4 % (ref 11.5–14.5)
WBC: 13.2 10*3/uL — ABNORMAL HIGH (ref 3.8–10.6)

## 2015-03-25 LAB — GLUCOSE, CAPILLARY
GLUCOSE-CAPILLARY: 131 mg/dL — AB (ref 65–99)
GLUCOSE-CAPILLARY: 259 mg/dL — AB (ref 65–99)
Glucose-Capillary: 134 mg/dL — ABNORMAL HIGH (ref 65–99)

## 2015-03-25 LAB — BASIC METABOLIC PANEL
ANION GAP: 8 (ref 5–15)
BUN: 40 mg/dL — ABNORMAL HIGH (ref 6–20)
CALCIUM: 9.4 mg/dL (ref 8.9–10.3)
CO2: 21 mmol/L — ABNORMAL LOW (ref 22–32)
CREATININE: 2.23 mg/dL — AB (ref 0.61–1.24)
Chloride: 110 mmol/L (ref 101–111)
GFR calc Af Amer: 34 mL/min — ABNORMAL LOW (ref >60)
GFR, EST NON AFRICAN AMERICAN: 29 mL/min — AB (ref >60)
GLUCOSE: 31 mg/dL — AB (ref 65–99)
Potassium: 4 mmol/L (ref 3.5–5.1)
Sodium: 139 mmol/L (ref 135–145)

## 2015-03-25 LAB — TROPONIN I: Troponin I: <0.03 ng/mL (ref <0.031)

## 2015-03-25 MED ORDER — SODIUM CHLORIDE 0.9 % IV BOLUS (SEPSIS)
1000.0000 mL | Freq: Once | INTRAVENOUS | Status: AC
  Administered 2015-03-25: 1000 mL via INTRAVENOUS

## 2015-03-25 MED ORDER — OCTREOTIDE ACETATE 100 MCG/ML IJ SOLN
50.0000 ug | Freq: Once | INTRAMUSCULAR | Status: DC
  Filled 2015-03-25: qty 1

## 2015-03-25 NOTE — ED Notes (Signed)
Pt  Alert.  Skin warm and dry.  Pt ate dinner tray.  Iv fluids infusing.  States i feel better.  Family with pt.

## 2015-03-25 NOTE — ED Notes (Signed)
Pt given 25gm of Dextrose 50% IV at this time

## 2015-03-25 NOTE — ED Notes (Signed)
fsbs 259

## 2015-03-25 NOTE — ED Notes (Signed)
Resumed care from tricia rn.  Pt alert.  nsr on monitor.  Skin warm and dry.  Iv in place.  Pt talking  Family with pt.

## 2015-03-25 NOTE — ED Notes (Signed)
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  Pt was brought into ED room slumped over. Blood glucose 24.  Pt given 25gm Dextrose 50% IV and became responsive again.

## 2015-03-25 NOTE — ED Notes (Signed)
D/c inst to pt.  Iv dc'ed.  Pt alert.  Skin warm and dry.  Family with pt.  No acute distress.

## 2015-03-25 NOTE — ED Notes (Signed)
Pt up to bathroom with assistance.  Pt was not discharged yet.  Iv still in place.  Pt alert.  Skin warm and dry.  Family with pt.  nsr on monitor.

## 2015-03-25 NOTE — ED Notes (Signed)
Pt alert.  Skin warm and dry.  Family with pt.  Denies pain.  States feeling better.

## 2015-03-25 NOTE — ED Notes (Signed)
Blood Glucose 24 at this time.

## 2015-03-25 NOTE — ED Provider Notes (Signed)
Patient's sugar has been stable we'll discharge him  Nena Polio, MD 03/25/15 1744

## 2015-03-25 NOTE — ED Provider Notes (Signed)
[facility] Emergency Department Provider Note    ____________________________________________  Time seen: On ED arrival  I have reviewed the triage vital signs and the nursing notes.   HISTORY  Chief Complaint Hypoglycemia   History limited by: Altered Mental Status   HPI Roger Knight is a 64 y.o. male who works in the [employer] at this hospital who was brought to the emergency department today by coworkers after being found with decreased responsiveness. The patient was found in the locker room hunched over a bench. He was nonverbal. Coworkers state that they think he has a history of diabetes and heart disease. The patient himself is unable to give any history.   Past Medical History  Diagnosis Date  . Arthritis   . GERD (gastroesophageal reflux disease)   . Diabetes mellitus without complication ([place])   . Hernia   . Anemia   . Hyperlipidemia   . Hypertension   . Carcinoma ([place]) 2012     skin cancer on neck [facility]  . Chronic kidney disease 2016  . Skin cancer   . Accelerating angina ([place]) 02/11/2014    Patient Active Problem List   Diagnosis Date Noted  . Diabetes ([place]) 11/16/2014  . Chronic kidney disease (CKD), stage III (moderate) 10/27/2014  . Arteriosclerosis of coronary artery 10/27/2014  . Acid reflux 10/27/2014  . Diabetic peripheral neuropathy associated with type 2 diabetes mellitus ([place]) 10/27/2014  . Dyslipidemia 10/27/2014  . Disorder of peripheral nervous system ([place]) 10/27/2014  . Anemia 10/13/2014  . Monoclonal gammopathy 10/13/2014  . Long term current use of insulin ([place]) 05/05/2014  . Acquired polyneuropathy ([place]) 05/05/2014  . Abnormal presence of protein in urine 05/05/2014  . Benign essential HTN 02/11/2014  . Accelerating angina ([place]) 02/11/2014  . Arthralgia of hand 12/17/2013  . Neuritis or radiculitis due to rupture of lumbar intervertebral disc 12/17/2013  . DDD (degenerative disc disease), lumbar 12/17/2013   . Rectal bleeding 04/17/2013  . Acute blood loss anemia 04/17/2013    Past Surgical History  Procedure Laterality Date  . Coronary artery bypass graft  1996    [facility]  . Coronary stent placement  2011    [facility]  . Colonoscopy  April 2014  . Upper gi endoscopy  2014    Current Outpatient Rx  Name  Route  Sig  Dispense  Refill  . amLODipine (NORVASC) 5 MG tablet   Oral   Take 10 mg by mouth daily.          Marland Kitchen aspirin 81 MG tablet   Oral   Take 81 mg by mouth daily.         Marland Kitchen atorvastatin (LIPITOR) 80 MG tablet   Oral   Take 80 mg by mouth daily.         . cetirizine (ZYRTEC) 10 MG tablet   Oral   Take 10 mg by mouth daily.         . Cholecalciferol (VITAMIN D3) 50000 UNITS TABS   Oral   Take 1 tablet by mouth every 30 (thirty) days.         Marland Kitchen ezetimibe (ZETIA) 10 MG tablet   Oral   Take 10 mg by mouth daily.         . Fe Fum-FePoly-Vit C-Vit B3 (INTEGRA PO)   Oral   Take 1 tablet by mouth 2 (two) times daily.         . ferrous fumarate (HEMOCYTE - 106 MG FE) 325 (106 FE)  MG TABS tablet   Oral   Take 1 tablet by mouth.         . FIBER FORMULA PO   Oral   Take by mouth 2 (two) times daily.         Marland Kitchen glipiZIDE (GLUCOTROL) 10 MG tablet   Oral   Take 10 mg by mouth 2 (two) times daily.         Marland Kitchen glucose blood (BAYER CONTOUR TEST) test strip      Use 1 strip via meter three times a day         . insulin aspart (NOVOLOG) 100 UNIT/ML injection   Subcutaneous   Inject 25 Units into the skin once. Before supper         . insulin aspart (NOVOLOG) 100 UNIT/ML injection   Subcutaneous   Inject 74 Units into the skin once.         . insulin aspart (NOVOLOG) 100 UNIT/ML injection   Subcutaneous   Inject 15 Units into the skin daily at 12 noon.         . insulin aspart (NOVOLOG) 100 UNIT/ML injection   Subcutaneous   Inject 20 Units into the skin daily before breakfast.          . insulin glargine (LANTUS) 100 UNIT/ML injection    Subcutaneous   Inject 65 Units into the skin at bedtime.         . Liraglutide (VICTOZA [place])   Subcutaneous   Inject 1.2 Units into the skin daily.         Marland Kitchen lisinopril (PRINIVIL,ZESTRIL) 20 MG tablet   Oral   Take 20 mg by mouth daily.         Marland Kitchen loratadine (CLARITIN) 10 MG tablet   Oral   Take 10 mg by mouth daily.         . metFORMIN (GLUCOPHAGE) 500 MG tablet   Oral   Take 1,000 mg by mouth 2 (two) times daily with a meal.          . metoprolol (TOPROL-XL) 200 MG 24 hr tablet   Oral   Take 200 mg by mouth daily.         . Multiple Vitamins-Minerals (MULTIVITAMIN WITH MINERALS) tablet   Oral   Take 1 tablet by mouth daily.         . pantoprazole (PROTONIX) 20 MG tablet   Oral   Take 40 mg by mouth daily.          . pregabalin (LYRICA) 75 MG capsule   Oral   Take 75 mg by mouth 2 (two) times daily. Usually takes 1 before bedtime only         . sildenafil (VIAGRA) 100 MG tablet   Oral   Take 100 mg by mouth as needed for erectile dysfunction.         Marland Kitchen spironolactone (ALDACTONE) 25 MG tablet   Oral   Take 25 mg by mouth 2 (two) times daily.         Marland Kitchen triamcinolone cream (KENALOG) 0.1 %   Topical   Apply 1 application topically 2 (two) times daily.         . valsartan-hydrochlorothiazide (DIOVAN-HCT) 320-25 MG per tablet   Oral   Take 1 tablet by mouth daily.           Allergies Review of patient's allergies indicates no known allergies.  Family History  Problem Relation Age of Onset  . Dementia  Mother 10  . Osteoarthritis Mother   . Cancer Other     skin cancer    Social History Social History  Substance Use Topics  . Smoking status: Former Smoker    Types: Cigarettes    Quit date: 04/24/2005  . Smokeless tobacco: Former Systems developer    Types: Chew  . Alcohol Use: No    Review of Systems Unable to obtain secondary to altered mental status ____________________________________________   PHYSICAL EXAM:  VITAL SIGNS: ED  Triage Vitals  Enc Vitals Group     BP 03/25/15 1500 133/65 mmHg     Pulse Rate 03/25/15 1447 66     Resp 03/25/15 1447 10     Temp 03/25/15 1447 97.3 F (36.3 C)     Temp Source 03/25/15 1447 Oral     SpO2 03/25/15 1447 99 %     Weight 03/25/15 1452 212 lb (96.163 kg)     Height 03/25/15 1452 5\' 7"  (1.702 m)   Constitutional: Awake, somewhat alert, able to track me with his eyes. Nonverbal. Diaphoretic Eyes: Conjunctivae are normal. PERRL. Normal extraocular movements. ENT   Head: Normocephalic and atraumatic.   Nose: No congestion/rhinnorhea.   Mouth/Throat: Mucous membranes are moist.   Neck: No stridor. Hematological/Lymphatic/Immunilogical: No cervical lymphadenopathy. Cardiovascular: Normal rate, regular rhythm.  No murmurs, rubs, or gallops. Respiratory: Normal respiratory effort without tachypnea nor retractions. Breath sounds are clear and equal bilaterally. No wheezes/rales/rhonchi. Gastrointestinal: Soft and nontender. No distention.  Genitourinary: Deferred Musculoskeletal: Normal range of motion in all extremities. No joint effusions.  No lower extremity tenderness nor edema. Neurologic:  Awake and somewhat alert. Will move all extremities. Nonverbal. Skin:  Skin is warm, dry and intact. No rash noted.   ____________________________________________    LABS (pertinent positives/negatives)  Trop <0.03 Cr 2.23 WBC 13.2   ____________________________________________    RADIOLOGY  None   ____________________________________________   PROCEDURES  Procedure(s) performed: None  Critical Care performed: Yes, see critical care note(s)  CRITICAL CARE Performed by: Nance Pear   Total critical care time: 30 minutes  Critical care time was exclusive of separately billable procedures and treating other patients.  Critical care was necessary to treat or prevent imminent or life-threatening deterioration.  Critical care was time spent  personally by me on the following activities: development of treatment plan with patient and/or surrogate as well as nursing, discussions with consultants, evaluation of patient's response to treatment, examination of patient, obtaining history from patient or surrogate, ordering and performing treatments and interventions, ordering and review of laboratory studies, ordering and review of radiographic studies, pulse oximetry and re-evaluation of patient's condition.  ____________________________________________   INITIAL IMPRESSION / ASSESSMENT AND PLAN / ED COURSE  Pertinent labs & imaging results that were available during my care of the patient were reviewed by me and considered in my medical decision making (see chart for details).  Patient presented to the emergency department from work today because of concerns for unresponsiveness. Upon initial assessment patient awake and somewhat alert. Non verbal. CBG was checked and found to be in the 20s. Patient was given an amp of D50 with good clinical improvement. Upon the patient becoming more alert and is getting more history. It appears that the patient waited longer than normal to have lunch. In addition he did give himself his insulin dose before eating. Think likely that is the cause of his hypoglycemia. The patient continued to do well through the rest of my shift. I did sign out to  continue to monitor the patient and if he is stable okay for discharge home with paperwork prepared by me.  ____________________________________________   FINAL CLINICAL IMPRESSION(S) / ED DIAGNOSES  Final diagnoses:  Hypoglycemia     Nance Pear, MD 03/26/15 1242

## 2015-03-25 NOTE — Discharge Instructions (Signed)
Please seek medical attention for any high fevers, chest pain, shortness of breath, change in behavior, persistent vomiting, bloody stool or any other new or concerning symptoms.   Hypoglycemia Hypoglycemia occurs when the glucose in your blood is too low. Glucose is a type of sugar that is your body's main energy source. Hormones, such as insulin and glucagon, control the level of glucose in the blood. Insulin lowers blood glucose and glucagon increases blood glucose. Having too much insulin in your blood stream, or not eating enough food containing sugar, can result in hypoglycemia. Hypoglycemia can happen to people with or without diabetes. It can develop quickly and can be a medical emergency.  CAUSES   Missing or delaying meals.  Not eating enough carbohydrates at meals.  Taking too much diabetes medicine.  Not timing your oral diabetes medicine or insulin doses with meals, snacks, and exercise.  Nausea and vomiting.  Certain medicines.  Severe illnesses, such as hepatitis, kidney disorders, and certain eating disorders.  Increased activity or exercise without eating something extra or adjusting medicines.  Drinking too much alcohol.  A nerve disorder that affects body functions like your heart rate, blood pressure, and digestion (autonomic neuropathy).  A condition where the stomach muscles do not function properly (gastroparesis). Therefore, medicines and food may not absorb properly.  Rarely, a tumor of the pancreas can produce too much insulin. SYMPTOMS   Hunger.  Sweating (diaphoresis).  Change in body temperature.  Shakiness.  Headache.  Anxiety.  Lightheadedness.  Irritability.  Difficulty concentrating.  Dry mouth.  Tingling or numbness in the hands or feet.  Restless sleep or sleep disturbances.  Altered speech and coordination.  Change in mental status.  Seizures or prolonged convulsions.  Combativeness.  Drowsiness  (lethargic).  Weakness.  Increased heart rate or palpitations.  Confusion.  Pale, gray skin color.  Blurred or double vision.  Fainting. DIAGNOSIS  A physical exam and medical history will be performed. Your caregiver may make a diagnosis based on your symptoms. Blood tests and other lab tests may be performed to confirm a diagnosis. Once the diagnosis is made, your caregiver will see if your signs and symptoms go away once your blood glucose is raised.  TREATMENT  Usually, you can easily treat your hypoglycemia when you notice symptoms.  Check your blood glucose. If it is less than 70 mg/dl, take one of the following:   3-4 glucose tablets.    cup juice.    cup regular soda.   1 cup skim milk.   -1 tube of glucose gel.   5-6 hard candies.   Avoid high-fat drinks or food that may delay a rise in blood glucose levels.  Do not take more than the recommended amount of sugary foods, drinks, gel, or tablets. Doing so will cause your blood glucose to go too high.   Wait 10-15 minutes and recheck your blood glucose. If it is still less than 70 mg/dl or below your target range, repeat treatment.   Eat a snack if it is more than 1 hour until your next meal.  There may be a time when your blood glucose may go so low that you are unable to treat yourself at home when you start to notice symptoms. You may need someone to help you. You may even faint or be unable to swallow. If you cannot treat yourself, someone will need to bring you to the hospital.  [place]  If you have diabetes, follow your diabetes management  plan by:  Taking your medicines as directed.  Following your exercise plan.  Following your meal plan. Do not skip meals. Eat on time.  Testing your blood glucose regularly. Check your blood glucose before and after exercise. If you exercise longer or different than usual, be sure to check blood glucose more frequently.  Wearing your  medical alert jewelry that says you have diabetes.  Identify the cause of your hypoglycemia. Then, develop ways to prevent the recurrence of hypoglycemia.  Do not take a hot bath or shower right after an insulin shot.  Always carry treatment with you. Glucose tablets are the easiest to carry.  If you are going to drink alcohol, drink it only with meals.  Tell friends or family members ways to keep you safe during a seizure. This may include removing hard or sharp objects from the area or turning you on your side.  Maintain a healthy weight. SEEK MEDICAL CARE IF:   You are having problems keeping your blood glucose in your target range.  You are having frequent episodes of hypoglycemia.  You feel you might be having side effects from your medicines.  You are not sure why your blood glucose is dropping so low.  You notice a change in vision or a new problem with your vision. SEEK IMMEDIATE MEDICAL CARE IF:   Confusion develops.  A change in mental status occurs.  The inability to swallow develops.  Fainting occurs.   This information is not intended to replace advice given to you by your health care provider. Make sure you discuss any questions you have with your health care provider.   Document Released: 04/10/2005 Document Revised: 04/15/2013 Document Reviewed: 12/15/2014 Elsevier Interactive Patient Education [employer].

## 2015-04-15 ENCOUNTER — Other Ambulatory Visit: Payer: Self-pay

## 2015-04-15 NOTE — Patient Outreach (Signed)
[facility]) Care Management  04/15/2015  Roger Knight [birth year] [id number]  This is the e-mail I received from [place] today; Since the last low blood sugar when I ate a full meal at [facility] and made it home only to pass out and had to call EMT's. Blood sugar was 23. This was just after I visited you around Nov. 7. Two weeks later I passed out at work at lunch after feeling the effects of low blood sugar before lunch, and still mistakenly injecting my normal 15 units of Novalog. Was taken to ER and blood sugar was 24.  I understand that one. Then last night I took both my shots of Novolog and Victoza and ate dinner of creamed eggs on toast. A little later my wife noticed my left hand was drawing, similar to a stroke, and I was incoherent. 911 was called and blood sugar was 34. Three times in five weeks is a bit concerning.   Since Dr. Gabriel Carina was not available until tomorrow am, my suggestion was: If it were me I'd want to make sure I eat 3 meals with carb and protein and decrease some of my insulin.  No sweet foods (juice, soda, tea, sweets)  call her tomorrow am   [name], RN, [place], [place], [place] Direct Dial:  [phone]  Fax:  [phone] E-mail: [name] [address] [place], [place]  [address]

## 2015-04-20 ENCOUNTER — Other Ambulatory Visit: Payer: Self-pay

## 2015-04-20 NOTE — Patient Outreach (Signed)
[facility]) Care Management  04/20/2015  Roger Knight 31-Jan-1951 [id number]  Received an e-mail on 04/16/15 regarding [name]'s visit to Dr. Gabriel Carina on the 23rd of December. She "discontinued the use of Glipizide all together and cut Novolog to 10 units at breakfast and dinner, discontinue any injection at lunch. Said sometimes further loss of kidney function could be responsible for dramatic drop in A1c. "   [name], RN, BA, [place], [place]:  [phone]  Fax:  [phone] E-mail: [email or web address] [address] [place], [place]  [address]

## 2015-05-11 ENCOUNTER — Inpatient Hospital Stay
Admission: EM | Admit: 2015-05-11 | Discharge: 2015-05-15 | DRG: 378 | Disposition: A | Discharge status: Home / Self Care | Payer: 59 | Attending: Internal Medicine | Admitting: Internal Medicine

## 2015-05-11 DIAGNOSIS — I129 Hypertensive chronic kidney disease with stage 1 through stage 4 chronic kidney disease, or unspecified chronic kidney disease: Secondary | ICD-10-CM | POA: Diagnosis present

## 2015-05-11 DIAGNOSIS — K5790 Diverticulosis of intestine, part unspecified, without perforation or abscess without bleeding: Secondary | ICD-10-CM | POA: Diagnosis not present

## 2015-05-11 DIAGNOSIS — K449 Diaphragmatic hernia without obstruction or gangrene: Secondary | ICD-10-CM | POA: Diagnosis not present

## 2015-05-11 DIAGNOSIS — Z7982 Long term (current) use of aspirin: Secondary | ICD-10-CM

## 2015-05-11 DIAGNOSIS — Z951 Presence of aortocoronary bypass graft: Secondary | ICD-10-CM | POA: Diagnosis not present

## 2015-05-11 DIAGNOSIS — Z794 Long term (current) use of insulin: Secondary | ICD-10-CM

## 2015-05-11 DIAGNOSIS — K625 Hemorrhage of anus and rectum: Secondary | ICD-10-CM | POA: Diagnosis not present

## 2015-05-11 DIAGNOSIS — E785 Hyperlipidemia, unspecified: Secondary | ICD-10-CM | POA: Diagnosis present

## 2015-05-11 DIAGNOSIS — K922 Gastrointestinal hemorrhage, unspecified: Secondary | ICD-10-CM | POA: Diagnosis not present

## 2015-05-11 DIAGNOSIS — D62 Acute posthemorrhagic anemia: Secondary | ICD-10-CM | POA: Diagnosis present

## 2015-05-11 DIAGNOSIS — Z87891 Personal history of nicotine dependence: Secondary | ICD-10-CM

## 2015-05-11 DIAGNOSIS — N183 Chronic kidney disease, stage 3 (moderate): Secondary | ICD-10-CM | POA: Diagnosis present

## 2015-05-11 DIAGNOSIS — E1122 Type 2 diabetes mellitus with diabetic chronic kidney disease: Secondary | ICD-10-CM | POA: Diagnosis present

## 2015-05-11 DIAGNOSIS — Z743 Need for continuous supervision: Secondary | ICD-10-CM | POA: Diagnosis not present

## 2015-05-11 DIAGNOSIS — I10 Essential (primary) hypertension: Secondary | ICD-10-CM | POA: Diagnosis not present

## 2015-05-11 DIAGNOSIS — M1990 Unspecified osteoarthritis, unspecified site: Secondary | ICD-10-CM | POA: Diagnosis present

## 2015-05-11 DIAGNOSIS — K317 Polyp of stomach and duodenum: Secondary | ICD-10-CM | POA: Diagnosis not present

## 2015-05-11 DIAGNOSIS — Z85828 Personal history of other malignant neoplasm of skin: Secondary | ICD-10-CM | POA: Diagnosis not present

## 2015-05-11 DIAGNOSIS — K921 Melena: Secondary | ICD-10-CM | POA: Diagnosis present

## 2015-05-11 DIAGNOSIS — K219 Gastro-esophageal reflux disease without esophagitis: Secondary | ICD-10-CM | POA: Diagnosis present

## 2015-05-11 DIAGNOSIS — D500 Iron deficiency anemia secondary to blood loss (chronic): Secondary | ICD-10-CM | POA: Diagnosis not present

## 2015-05-11 DIAGNOSIS — Z955 Presence of coronary angioplasty implant and graft: Secondary | ICD-10-CM

## 2015-05-11 DIAGNOSIS — R55 Syncope and collapse: Secondary | ICD-10-CM | POA: Diagnosis not present

## 2015-05-11 DIAGNOSIS — K644 Residual hemorrhoidal skin tags: Secondary | ICD-10-CM | POA: Diagnosis present

## 2015-05-11 DIAGNOSIS — I959 Hypotension, unspecified: Secondary | ICD-10-CM | POA: Diagnosis present

## 2015-05-11 DIAGNOSIS — E119 Type 2 diabetes mellitus without complications: Secondary | ICD-10-CM | POA: Diagnosis not present

## 2015-05-11 DIAGNOSIS — I951 Orthostatic hypotension: Secondary | ICD-10-CM | POA: Diagnosis present

## 2015-05-11 DIAGNOSIS — R509 Fever, unspecified: Secondary | ICD-10-CM | POA: Diagnosis not present

## 2015-05-11 DIAGNOSIS — K3189 Other diseases of stomach and duodenum: Secondary | ICD-10-CM | POA: Diagnosis not present

## 2015-05-11 DIAGNOSIS — K5730 Diverticulosis of large intestine without perforation or abscess without bleeding: Secondary | ICD-10-CM | POA: Diagnosis not present

## 2015-05-11 DIAGNOSIS — R001 Bradycardia, unspecified: Secondary | ICD-10-CM | POA: Diagnosis present

## 2015-05-11 DIAGNOSIS — N179 Acute kidney failure, unspecified: Secondary | ICD-10-CM | POA: Diagnosis present

## 2015-05-11 DIAGNOSIS — J984 Other disorders of lung: Secondary | ICD-10-CM | POA: Diagnosis not present

## 2015-05-11 DIAGNOSIS — I25119 Atherosclerotic heart disease of native coronary artery with unspecified angina pectoris: Secondary | ICD-10-CM | POA: Diagnosis present

## 2015-05-11 DIAGNOSIS — I2510 Atherosclerotic heart disease of native coronary artery without angina pectoris: Secondary | ICD-10-CM | POA: Diagnosis not present

## 2015-05-11 LAB — ABO/RH: ABO/RH(D): O POS

## 2015-05-11 LAB — CBC
HCT: 24.8 % — ABNORMAL LOW (ref 40.0–52.0)
Hemoglobin: 8.1 g/dL — ABNORMAL LOW (ref 13.0–18.0)
MCH: 29.7 pg (ref 26.0–34.0)
MCHC: 32.8 g/dL (ref 32.0–36.0)
MCV: 90.5 fL (ref 80.0–100.0)
PLATELETS: 230 10*3/uL (ref 150–440)
RBC: 2.74 MIL/uL — ABNORMAL LOW (ref 4.40–5.90)
RDW: 14 % (ref 11.5–14.5)
WBC: 8 10*3/uL (ref 3.8–10.6)

## 2015-05-11 LAB — BASIC METABOLIC PANEL
Anion gap: 8 (ref 5–15)
BUN: 47 mg/dL — AB (ref 6–20)
CO2: 15 mmol/L — ABNORMAL LOW (ref 22–32)
CREATININE: 2.7 mg/dL — AB (ref 0.61–1.24)
Calcium: 8 mg/dL — ABNORMAL LOW (ref 8.9–10.3)
Chloride: 109 mmol/L (ref 101–111)
GFR calc Af Amer: 27 mL/min — ABNORMAL LOW (ref >60)
GFR, EST NON AFRICAN AMERICAN: 23 mL/min — AB (ref >60)
GLUCOSE: 259 mg/dL — AB (ref 65–99)
Potassium: 4.9 mmol/L (ref 3.5–5.1)
SODIUM: 132 mmol/L — AB (ref 135–145)

## 2015-05-11 LAB — GLUCOSE, CAPILLARY
Glucose-Capillary: 182 mg/dL — ABNORMAL HIGH (ref 65–99)
Glucose-Capillary: 180 mg/dL — ABNORMAL HIGH (ref 65–99)
Glucose-Capillary: 131 mg/dL — ABNORMAL HIGH (ref 65–99)
Glucose-Capillary: 113 mg/dL — ABNORMAL HIGH (ref 65–99)

## 2015-05-11 LAB — HEMOGLOBIN: Hemoglobin: 8.6 g/dL — ABNORMAL LOW (ref 13.0–18.0)

## 2015-05-11 LAB — TSH: TSH: 3.225 u[IU]/mL (ref 0.350–4.500)

## 2015-05-11 LAB — HEMOGLOBIN A1C: Hgb A1c MFr Bld: 6.8 % — ABNORMAL HIGH (ref 4.0–6.0)

## 2015-05-11 LAB — PREPARE RBC (CROSSMATCH)

## 2015-05-11 MED ORDER — PANTOPRAZOLE SODIUM 40 MG PO TBEC
40.0000 mg | DELAYED_RELEASE_TABLET | Freq: Every day | ORAL | Status: DC
  Administered 2015-05-11 – 2015-05-15 (×4): 40 mg via ORAL
  Filled 2015-05-11 (×5): qty 1

## 2015-05-11 MED ORDER — FERROUS FUMARATE 325 (106 FE) MG PO TABS
1.0000 | ORAL_TABLET | Freq: Two times a day (BID) | ORAL | Status: DC
  Administered 2015-05-11 – 2015-05-15 (×8): 106 mg via ORAL
  Filled 2015-05-11 (×9): qty 1

## 2015-05-11 MED ORDER — INSULIN ASPART 100 UNIT/ML ~~LOC~~ SOLN: 0.0000 [IU] | Freq: Three times a day (TID) | SUBCUTANEOUS | Status: DC

## 2015-05-11 MED ORDER — MORPHINE SULFATE (PF) 2 MG/ML IV SOLN: 2.0000 mg | INTRAVENOUS | Status: DC | PRN

## 2015-05-11 MED ORDER — VALSARTAN-HYDROCHLOROTHIAZIDE 320-25 MG PO TABS: 1.0000 | ORAL_TABLET | Freq: Every day | ORAL | Status: DC

## 2015-05-11 MED ORDER — INSULIN ASPART 100 UNIT/ML ~~LOC~~ SOLN: 0.0000 [IU] | Freq: Every day | SUBCUTANEOUS | Status: DC

## 2015-05-11 MED ORDER — SODIUM CHLORIDE 0.9 % IV SOLN: Freq: Once | INTRAVENOUS | Status: DC

## 2015-05-11 MED ORDER — LISINOPRIL 20 MG PO TABS: 20.0000 mg | ORAL_TABLET | Freq: Every day | ORAL | Status: DC

## 2015-05-11 MED ORDER — ACETAMINOPHEN 650 MG RE SUPP
650.0000 mg | Freq: Four times a day (QID) | RECTAL | Status: DC | PRN
Start: 2015-05-11 — End: 2015-05-15

## 2015-05-11 MED ORDER — INSULIN ASPART 100 UNIT/ML ~~LOC~~ SOLN
0.0000 [IU] | SUBCUTANEOUS | Status: DC
  Administered 2015-05-11 – 2015-05-12 (×2): 1 [IU] via SUBCUTANEOUS
  Administered 2015-05-12: 2 [IU] via SUBCUTANEOUS
  Administered 2015-05-12: 1 [IU] via SUBCUTANEOUS
  Administered 2015-05-12: 2 [IU] via SUBCUTANEOUS
  Administered 2015-05-13 (×2): 1 [IU] via SUBCUTANEOUS
  Administered 2015-05-13 (×2): 3 [IU] via SUBCUTANEOUS
  Administered 2015-05-13: 1 [IU] via SUBCUTANEOUS
  Administered 2015-05-14: 7 [IU] via SUBCUTANEOUS
  Administered 2015-05-14 (×3): 2 [IU] via SUBCUTANEOUS
  Filled 2015-05-11: qty 3
  Filled 2015-05-11: qty 2
  Filled 2015-05-11: qty 1
  Filled 2015-05-11: qty 2
  Filled 2015-05-11: qty 1
  Filled 2015-05-11: qty 2
  Filled 2015-05-11: qty 1
  Filled 2015-05-11: qty 7
  Filled 2015-05-11: qty 1
  Filled 2015-05-11: qty 2
  Filled 2015-05-11: qty 1
  Filled 2015-05-11: qty 3
  Filled 2015-05-11 (×2): qty 1

## 2015-05-11 MED ORDER — ONDANSETRON HCL 4 MG/2ML IJ SOLN
4.0000 mg | Freq: Four times a day (QID) | INTRAMUSCULAR | Status: DC | PRN
  Administered 2015-05-13: 4 mg via INTRAVENOUS
  Filled 2015-05-11: qty 2

## 2015-05-11 MED ORDER — PNEUMOCOCCAL VAC POLYVALENT 25 MCG/0.5ML IJ INJ
0.5000 mL | INJECTION | INTRAMUSCULAR | Status: AC
  Administered 2015-05-12: 0.5 mL via INTRAMUSCULAR
  Filled 2015-05-11: qty 0.5

## 2015-05-11 MED ORDER — METOPROLOL SUCCINATE ER 50 MG PO TB24
200.0000 mg | ORAL_TABLET | Freq: Every day | ORAL | Status: DC
  Administered 2015-05-11 – 2015-05-15 (×4): 200 mg via ORAL
  Filled 2015-05-11 (×7): qty 4

## 2015-05-11 MED ORDER — HYDROCHLOROTHIAZIDE 25 MG PO TABS
25.0000 mg | ORAL_TABLET | Freq: Every day | ORAL | Status: DC
  Administered 2015-05-11 – 2015-05-15 (×4): 25 mg via ORAL
  Filled 2015-05-11 (×5): qty 1

## 2015-05-11 MED ORDER — SPIRONOLACTONE 25 MG PO TABS
25.0000 mg | ORAL_TABLET | Freq: Two times a day (BID) | ORAL | Status: DC
  Administered 2015-05-11 – 2015-05-15 (×9): 25 mg via ORAL
  Filled 2015-05-11 (×9): qty 1

## 2015-05-11 MED ORDER — HYDROCORTISONE ACETATE 25 MG RE SUPP
25.0000 mg | Freq: Once | RECTAL | Status: AC
  Administered 2015-05-11: 25 mg via RECTAL
  Filled 2015-05-11: qty 1

## 2015-05-11 MED ORDER — VALSARTAN 160 MG PO TABS
320.0000 mg | ORAL_TABLET | Freq: Every day | ORAL | Status: DC
  Administered 2015-05-11 – 2015-05-15 (×4): 320 mg via ORAL
  Filled 2015-05-11 (×5): qty 2

## 2015-05-11 MED ORDER — ATORVASTATIN CALCIUM 20 MG PO TABS
80.0000 mg | ORAL_TABLET | Freq: Every day | ORAL | Status: DC
  Administered 2015-05-11 – 2015-05-14 (×4): 80 mg via ORAL
  Filled 2015-05-11 (×4): qty 4

## 2015-05-11 MED ORDER — INSULIN GLARGINE 100 UNIT/ML ~~LOC~~ SOLN
25.0000 [IU] | Freq: Every day | SUBCUTANEOUS | Status: DC
  Filled 2015-05-11 (×2): qty 0.25

## 2015-05-11 MED ORDER — AMLODIPINE BESYLATE 10 MG PO TABS
10.0000 mg | ORAL_TABLET | Freq: Every day | ORAL | Status: DC
Start: 2015-05-11 — End: 2015-05-12
  Filled 2015-05-11: qty 1

## 2015-05-11 MED ORDER — TRIAMCINOLONE ACETONIDE 0.1 % EX CREA: 1.0000 "application " | TOPICAL_CREAM | Freq: Two times a day (BID) | CUTANEOUS | Status: DC | PRN

## 2015-05-11 MED ORDER — ADULT MULTIVITAMIN W/MINERALS CH
1.0000 | ORAL_TABLET | Freq: Every day | ORAL | Status: DC
  Administered 2015-05-11 – 2015-05-15 (×4): 1 via ORAL
  Filled 2015-05-11 (×5): qty 1

## 2015-05-11 MED ORDER — LORATADINE 10 MG PO TABS
10.0000 mg | ORAL_TABLET | Freq: Every day | ORAL | Status: DC
  Administered 2015-05-11 – 2015-05-15 (×4): 10 mg via ORAL
  Filled 2015-05-11 (×5): qty 1

## 2015-05-11 MED ORDER — SODIUM CHLORIDE 0.9 % IJ SOLN
3.0000 mL | Freq: Two times a day (BID) | INTRAMUSCULAR | Status: DC
  Administered 2015-05-11 – 2015-05-15 (×7): 3 mL via INTRAVENOUS

## 2015-05-11 MED ORDER — SODIUM CHLORIDE 0.9 % IV SOLN
INTRAVENOUS | Status: DC
  Administered 2015-05-11 – 2015-05-12 (×3): via INTRAVENOUS

## 2015-05-11 MED ORDER — PREGABALIN 75 MG PO CAPS
75.0000 mg | ORAL_CAPSULE | Freq: Two times a day (BID) | ORAL | Status: DC
  Administered 2015-05-11: 75 mg via ORAL
  Filled 2015-05-11 (×3): qty 1

## 2015-05-11 MED ORDER — ACETAMINOPHEN 325 MG PO TABS
650.0000 mg | ORAL_TABLET | Freq: Four times a day (QID) | ORAL | Status: DC | PRN
  Administered 2015-05-13: 650 mg via ORAL
  Filled 2015-05-11: qty 2

## 2015-05-11 MED ORDER — EZETIMIBE 10 MG PO TABS
10.0000 mg | ORAL_TABLET | Freq: Every day | ORAL | Status: DC
  Administered 2015-05-11 – 2015-05-15 (×4): 10 mg via ORAL
  Filled 2015-05-11 (×5): qty 1

## 2015-05-11 MED ORDER — ONDANSETRON HCL 4 MG PO TABS: 4.0000 mg | ORAL_TABLET | Freq: Four times a day (QID) | ORAL | Status: DC | PRN

## 2015-05-11 NOTE — Progress Notes (Signed)
Pt came to floor at approximately 0830. VSS. Pt was alert and oriented. Oriented to room and safety plan. Blood transfusion started at 0918 per MD order.

## 2015-05-11 NOTE — H&P (Signed)
Roger Knight is an 65 y.o. male.   Chief Complaint: Rectal bleeding HPI: The patient presents to the emergency department complaining of a large volume of bright red blood per rectum. The patient states the bleeding was painless. He has felt well. He denies fever, chest pain, shortness of breath, nausea, vomiting or diarrhea. He states this has happened before when he had been on Effient. However, at this time he is on no blood thinners. In the emergency department vital signs were found to be stable but his hemoglobin dropped from 12 g to 8 g which prompted the emergency department staff to obtain a type and screen for blood transfusion prior to calling the hospitalist service for admission.  Past Medical History  Diagnosis Date  . Arthritis   . GERD (gastroesophageal reflux disease)   . Diabetes mellitus without complication ([place])   . Hernia   . Anemia   . Hyperlipidemia   . Hypertension   . Carcinoma ([place]) 2012     skin cancer on neck [facility]  . Chronic kidney disease 2016  . Skin cancer   . Accelerating angina ([place]) 02/11/2014    Past Surgical History  Procedure Laterality Date  . Coronary artery bypass graft  1996    [facility]  . Coronary stent placement  2011    [facility]  . Colonoscopy  April 2014  . Upper gi endoscopy  2014    Family History  Problem Relation Age of Onset  . Dementia Mother 51  . Osteoarthritis Mother   . Cancer Other     skin cancer   Social History:  reports that he quit smoking about 10 years ago. His smoking use included Cigarettes. He has quit using smokeless tobacco. His smokeless tobacco use included Chew. He reports that he does not drink alcohol or use illicit drugs.  Allergies: No Known Allergies  Prior to Admission medications   Medication Sig Start Date End Date Taking? Authorizing Provider  amLODipine (NORVASC) 5 MG tablet Take 10 mg by mouth daily.    Yes Historical Provider, MD  aspirin EC 81 MG tablet Take 81 mg by mouth daily.   Yes  Historical Provider, MD  atorvastatin (LIPITOR) 80 MG tablet Take 80 mg by mouth daily.   Yes Historical Provider, MD  cetirizine (ZYRTEC) 10 MG tablet Take 10 mg by mouth daily.   Yes Historical Provider, MD  ezetimibe (ZETIA) 10 MG tablet Take 10 mg by mouth daily.   Yes Historical Provider, MD  ferrous fumarate (HEMOCYTE - 106 MG FE) 325 (106 FE) MG TABS tablet Take 1 tablet by mouth 2 (two) times daily.    Yes Historical Provider, MD  FIBER FORMULA PO Take 1 tablet by mouth 2 (two) times daily.    Yes Historical Provider, MD  insulin aspart (NOVOLOG) 100 UNIT/ML injection Inject 15 Units into the skin 2 (two) times daily.    Yes Historical Provider, MD  Liraglutide (VICTOZA [place]) Inject 1.2 Units into the skin daily at 6 PM.    Yes Historical Provider, MD  lisinopril (PRINIVIL,ZESTRIL) 20 MG tablet Take 20 mg by mouth daily.   Yes Historical Provider, MD  metFORMIN (GLUCOPHAGE) 500 MG tablet Take 1,000 mg by mouth 2 (two) times daily with a meal.    Yes Historical Provider, MD  metoprolol (TOPROL-XL) 200 MG 24 hr tablet Take 200 mg by mouth daily.   Yes Historical Provider, MD  Multiple Vitamins-Minerals (MULTIVITAMIN WITH MINERALS) tablet Take 1 tablet by mouth daily.  Yes Historical Provider, MD  pantoprazole (PROTONIX) 20 MG tablet Take 40 mg by mouth daily.    Yes Historical Provider, MD  pregabalin (LYRICA) 75 MG capsule Take 75 mg by mouth 2 (two) times daily. Usually takes 1 at dinner time only   Yes Historical Provider, MD  sildenafil (VIAGRA) 100 MG tablet Take 100 mg by mouth as needed for erectile dysfunction.   Yes Historical Provider, MD  spironolactone (ALDACTONE) 25 MG tablet Take 25 mg by mouth 2 (two) times daily.   Yes Historical Provider, MD  triamcinolone cream (KENALOG) 0.1 % Apply 1 application topically 2 (two) times daily as needed (rash).    Yes Historical Provider, MD  valsartan-hydrochlorothiazide (DIOVAN-HCT) 320-25 MG per tablet Take 1 tablet by mouth daily.   Yes  Historical Provider, MD     Results for orders placed or performed during the hospital encounter of 05/11/15 (from the past 48 hour(s))  CBC     Status: Abnormal   Collection Time: 05/11/15  5:09 AM  Result Value Ref Range   WBC 8.0 3.8 - 10.6 K/uL   RBC 2.74 (L) 4.40 - 5.90 MIL/uL   Hemoglobin 8.1 (L) 13.0 - 18.0 g/dL   HCT 24.8 (L) 40.0 - 52.0 %   MCV 90.5 80.0 - 100.0 fL   MCH 29.7 26.0 - 34.0 pg   MCHC 32.8 32.0 - 36.0 g/dL   RDW 14.0 11.5 - 14.5 %   Platelets 230 150 - 440 K/uL  Basic metabolic panel     Status: Abnormal   Collection Time: 05/11/15  5:09 AM  Result Value Ref Range   Sodium 132 (L) 135 - 145 mmol/L   Potassium 4.9 3.5 - 5.1 mmol/L    Comment: HEMOLYSIS AT THIS LEVEL MAY AFFECT RESULT   Chloride 109 101 - 111 mmol/L   CO2 15 (L) 22 - 32 mmol/L   Glucose, Bld 259 (H) 65 - 99 mg/dL   BUN 47 (H) 6 - 20 mg/dL   Creatinine, Ser 2.70 (H) 0.61 - 1.24 mg/dL   Calcium 8.0 (L) 8.9 - 10.3 mg/dL   GFR calc non Af Amer 23 (L) >60 mL/min   GFR calc Af Amer 27 (L) >60 mL/min    Comment: (NOTE) The eGFR has been calculated using the CKD EPI equation. This calculation has not been validated in all clinical situations. eGFR's persistently <60 mL/min signify possible Chronic Kidney Disease.    Anion gap 8 5 - 15  Type and screen [facility]     Status: None (Preliminary result)   Collection Time: 05/11/15  5:09 AM  Result Value Ref Range   ABO/RH(D) PENDING    Antibody Screen PENDING    Sample Expiration 05/14/2015   ABO/Rh     Status: None   Collection Time: 05/11/15  5:10 AM  Result Value Ref Range   ABO/RH(D) O POS    No results found.  Review of Systems  Constitutional: Negative for fever and chills.  HENT: Negative for sore throat and tinnitus.   Eyes: Negative for blurred vision and redness.  Respiratory: Negative for cough and shortness of breath.   Cardiovascular: Negative for chest pain, palpitations, orthopnea and PND.   Gastrointestinal: Positive for blood in stool. Negative for nausea, vomiting, abdominal pain and diarrhea.  Genitourinary: Negative for dysuria, urgency and frequency.  Musculoskeletal: Negative for myalgias and joint pain.  Skin: Negative for rash.       No lesions  Neurological: Negative for  speech change, focal weakness and weakness.  Endo/Heme/Allergies: Does not bruise/bleed easily.       No temperature intolerance  Psychiatric/Behavioral: Negative for depression and suicidal ideas.    Blood pressure 109/65, pulse 66, temperature 97.3 F (36.3 C), temperature source Oral, resp. rate 14, height _0  (1.702 m), weight 95.255 kg (210 lb), SpO2 94 %. Physical Exam  Nursing note and vitals reviewed. Constitutional: He is oriented to person, place, and time. He appears well-developed and well-nourished. No distress.  HENT:  Head: Normocephalic and atraumatic.  Mouth/Throat: Oropharynx is clear and moist.  Eyes: Conjunctivae and EOM are normal. Pupils are equal, round, and reactive to light. No scleral icterus.  Neck: Normal range of motion. Neck supple. No JVD present. No tracheal deviation present. No thyromegaly present.  Cardiovascular: Normal rate, regular rhythm and normal heart sounds.  Exam reveals no gallop and no friction rub.   No murmur heard. Respiratory: Effort normal and breath sounds normal. No respiratory distress.  GI: Soft. Bowel sounds are normal. He exhibits no distension. There is no tenderness.  Genitourinary:  Deferred  Musculoskeletal: Normal range of motion. He exhibits no edema.  Lymphadenopathy:    He has no cervical adenopathy.  Neurological: He is alert and oriented to person, place, and time. No cranial nerve deficit.  Skin: Skin is warm and dry. No rash noted. No erythema.  Psychiatric: He has a normal mood and affect. His behavior is normal. Judgment and thought content normal.     Assessment/Plan This is a 65 year old Caucasian male with past  medical history of coronary artery disease, chronic kidney disease and diabetes admitted for bright red bleeding per rectum. 1. Lower GI bleed: Painless. The patient has been consented for blood transfusion. Gastroenterology consulted. He is nothing by mouth. 2. Coronary artery disease: Stable. I have held aspirin for now. 3. Essential hypertension: Continue amlodipine, metoprolol and Diovan HCT, lisinopril and spironolactone.  4. Diabetes mellitus type 2: Hold metformin as well as Victoza for now. Sliding scale insulin while hospitalized 5. DVT prophylaxis: SCDs 6. GI prophylaxis: PPI The patient is a full code. Time spent on admission was inpatient care proximally 45 minutes  [place], [place] 05/11/2015, 6:51 AM

## 2015-05-11 NOTE — Progress Notes (Signed)
MD verbally ordered to not give 1200 insulin dose.

## 2015-05-11 NOTE — Progress Notes (Addendum)
Inpatient Diabetes Program Recommendations  AACE/ADA: New Consensus Statement on Inpatient Glycemic Control (2015)  Target Ranges:  Prepandial:   less than 140 mg/dL      Peak postprandial:   less than 180 mg/dL (1-2 hours)      Critically ill patients:  140 - 180 mg/dL   Review of Glycemic Control  Results for Roger Knight, Roger Knight (MRN [id number]) as of 05/11/2015 10:18  Ref. Range 05/11/2015 08:38  Glucose-Capillary Latest Ref Range: 65-99 mg/dL 182 (H)    Diabetes history: Type 2 Outpatient Diabetes medications: Lantus 65 units qhs, Metformin 1000mg  bid, Victoza 1.2mg /day, ordered Novolog 10 units bid (breakfast and supper)- he increased it to 15 units bid  Current orders for Inpatient glycemic control: Novolog 0-15 units tid, Novolog 0-5 units qhs  Inpatient Diabetes Program Recommendations: Employee of [facility] [employer] - seen regularly by endocrinologist (see recent note 04/16/15) Has had a few episodes of hypoglycemia unawareness. MD stopped Glipizide and decreased Novolog to 10 units bid in January 2017- [name] increased it back up to 15 units bid.  Please consider ordering Lantus 30 units qhs.   If he remains NPO, consider ordering Novolog 0-9 units q4h (and d/c Novolog 0-15 units tid and 0-5 units qhs)   [name], RN, [place], [place], CDE Diabetes Coordinator Inpatient Diabetes Program  [phone] (Team Pager) [phone] ([place]) 05/11/2015 10:38 AM

## 2015-05-11 NOTE — Progress Notes (Signed)
Per MD, Hold off on second unit of blood for now. Pt's hgb is stable at 8.6

## 2015-05-11 NOTE — Consult Note (Signed)
GI Inpatient Consult Note  Reason for Consult: BRBPR   Attending Requesting Consult: [place]  History of Present Illness: Roger Knight is a 65 y.o. male with a history of diverticulosis, GERD, chronic anemia (followed by hematology), and CKD presented to the [facility] ED on 05/11/15 after an episode of BRBPR while having a BM.  Patient states he got up to have a BM around 3am, but noticed BRB dripping from his bottom on the way to the bathroom.   During the BM, a moderate volume of blood that filled the commode water.  Afterwards, he felt very dizzy and diaphoretic, leading to a syncopal episode.   Patient's wife call EMS at this time.  Patient reports an episode of the same last week, which resolved after one BM.   He also underwent a colonoscopy two years ago for rectal bleeding which showed diverticulosis throughout the colon and multiple polyps, including a few TAs.  Significant labs include Hgb 8.1, Hct 24.8, and MCV 90.5.  He was admitted for further evaluation and management.  Upon admission, patient received a unit of PRBCs.  Hgb improved to 8.6.   Roger Knight denies chest pain, shortness of breath, vomiting, melena, or any GERD-like symptoms.  Patient's wife stated that he has complained of abdominal pain and distention since over the last few weeks, and he has also been more fatigued than normal.   Prior to his episode of syncope this mornig, he notes feeling very nauseous. He also says he had pin point pain in the rectum while passing the BM, which is new.  At baseline, Roger Knight has constipation helped by fiber supplementation, but during the episodes of bleeding his stools are loose.  Patient denies any family hx of colon cancer or colon polyps.      Last colonoscopy: 07/26/12 - Gustavo Lah) diverticulosis in sigmoid, descending, transverse colon, and ascending colon; one 11 mm polyp at the splenic flexure (TA); two 1-2 mm polyps at the splenic flexure (one TA, one colonic mucosa with  focal hyperplastic changes).   Of note, it appears that he had an EGD at that time with negative pathology; however, the full report is not available on Epic for my review.  Past Medical History:  Past Medical History  Diagnosis Date  . Arthritis   . GERD (gastroesophageal reflux disease)   . Diabetes mellitus without complication ([place])   . Hernia   . Anemia   . Hyperlipidemia   . Hypertension   . Carcinoma ([place]) 2012     skin cancer on neck [facility]  . Chronic kidney disease 2016  . Skin cancer   . Accelerating angina ([place]) 02/11/2014    Problem List: Patient Active Problem List   Diagnosis Date Noted  . GIB (gastrointestinal bleeding) 05/11/2015  . Diabetes ([place]) 11/16/2014  . Chronic kidney disease (CKD), stage III (moderate) 10/27/2014  . Arteriosclerosis of coronary artery 10/27/2014  . Acid reflux 10/27/2014  . Diabetic peripheral neuropathy associated with type 2 diabetes mellitus ([place]) 10/27/2014  . Dyslipidemia 10/27/2014  . Disorder of peripheral nervous system ([place]) 10/27/2014  . Anemia 10/13/2014  . Monoclonal gammopathy 10/13/2014  . Long term current use of insulin ([place]) 05/05/2014  . Acquired polyneuropathy ([place]) 05/05/2014  . Abnormal presence of protein in urine 05/05/2014  . Benign essential HTN 02/11/2014  . Accelerating angina ([place]) 02/11/2014  . Arthralgia of hand 12/17/2013  . Neuritis or radiculitis due to rupture of lumbar intervertebral disc 12/17/2013  . DDD (degenerative disc disease),  lumbar 12/17/2013  . Rectal bleeding 04/17/2013  . Acute blood loss anemia 04/17/2013    Past Surgical History: Past Surgical History  Procedure Laterality Date  . Coronary artery bypass graft  1996    [facility]  . Coronary stent placement  2011    [facility]  . Colonoscopy  April 2014  . Upper gi endoscopy  2014    Allergies: No Known Allergies  Home Medications: Prescriptions prior to admission  Medication Sig Dispense Refill Last Dose  . amLODipine (NORVASC) 5 MG  tablet Take 10 mg by mouth daily.    05/10/2015 at Unknown time  . aspirin EC 81 MG tablet Take 81 mg by mouth daily.   05/10/2015 at Unknown time  . atorvastatin (LIPITOR) 80 MG tablet Take 80 mg by mouth daily.   05/10/2015 at Unknown time  . cetirizine (ZYRTEC) 10 MG tablet Take 10 mg by mouth daily.   05/10/2015 at Unknown time  . ezetimibe (ZETIA) 10 MG tablet Take 10 mg by mouth daily.   05/10/2015 at Unknown time  . ferrous fumarate (HEMOCYTE - 106 MG FE) 325 (106 FE) MG TABS tablet Take 1 tablet by mouth 2 (two) times daily.    05/10/2015 at Unknown time  . FIBER FORMULA PO Take 1 tablet by mouth 2 (two) times daily.    05/10/2015 at Unknown time  . insulin aspart (NOVOLOG) 100 UNIT/ML injection Inject 15 Units into the skin 2 (two) times daily.    05/10/2015 at Unknown time  . Liraglutide (VICTOZA [place]) Inject 1.2 Units into the skin daily at 6 PM.    05/10/2015 at Unknown time  . lisinopril (PRINIVIL,ZESTRIL) 20 MG tablet Take 20 mg by mouth daily.   05/10/2015 at Unknown time  . metFORMIN (GLUCOPHAGE) 500 MG tablet Take 1,000 mg by mouth 2 (two) times daily with a meal.    05/10/2015 at Unknown time  . metoprolol (TOPROL-XL) 200 MG 24 hr tablet Take 200 mg by mouth daily.   05/10/2015 at 0600  . Multiple Vitamins-Minerals (MULTIVITAMIN WITH MINERALS) tablet Take 1 tablet by mouth daily.   05/10/2015 at Unknown time  . pantoprazole (PROTONIX) 20 MG tablet Take 40 mg by mouth daily.    05/10/2015 at Unknown time  . pregabalin (LYRICA) 75 MG capsule Take 75 mg by mouth 2 (two) times daily. Usually takes 1 at dinner time only   05/10/2015 at Unknown time  . sildenafil (VIAGRA) 100 MG tablet Take 100 mg by mouth as needed for erectile dysfunction.   prn at prn  . spironolactone (ALDACTONE) 25 MG tablet Take 25 mg by mouth 2 (two) times daily.   05/10/2015 at Unknown time  . triamcinolone cream (KENALOG) 0.1 % Apply 1 application topically 2 (two) times daily as needed (rash).    prn at prn  .  valsartan-hydrochlorothiazide (DIOVAN-HCT) 320-25 MG per tablet Take 1 tablet by mouth daily.   05/10/2015 at Unknown time   Home medication reconciliation was completed with the patient.   Scheduled Inpatient Medications:   . sodium chloride   Intravenous Once  . amLODipine  10 mg Oral Daily  . atorvastatin  80 mg Oral q1800  . ezetimibe  10 mg Oral Daily  . ferrous fumarate  1 tablet Oral BID  . valsartan  320 mg Oral Daily   And  . hydrochlorothiazide  25 mg Oral Daily  . insulin aspart  0-15 Units Subcutaneous TID WC  . insulin aspart  0-5 Units Subcutaneous QHS  .  lisinopril  20 mg Oral Daily  . loratadine  10 mg Oral Daily  . metoprolol  200 mg Oral Daily  . multivitamin with minerals  1 tablet Oral Daily  . pantoprazole  40 mg Oral Daily  . pregabalin  75 mg Oral BID WC  . sodium chloride  3 mL Intravenous Q12H  . spironolactone  25 mg Oral BID WC    Continuous Inpatient Infusions:   . sodium chloride      PRN Inpatient Medications:  acetaminophen **OR** acetaminophen, morphine injection, ondansetron **OR** ondansetron (ZOFRAN) IV, triamcinolone cream  Family History: family history includes Cancer in his other; Dementia (age of onset: 60) in his mother; Osteoarthritis in his mother. .  Social History:   reports that he quit smoking about 10 years ago. His smoking use included Cigarettes. He has quit using smokeless tobacco. His smokeless tobacco use included Chew. He reports that he does not drink alcohol or use illicit drugs.    Review of Systems: Constitutional: + 5 lb unintentional weight loss in the last few months Eyes: No changes in vision. ENT: No oral lesions, sore throat.  GI: see HPI.  Heme/Lymph: No easy bruising.  CV: No chest pain.  GU: No hematuria.  Integumentary: No rashes.  Neuro: No headaches.  Psych: No depression/anxiety.  Endocrine: No heat/cold intolerance.  Allergic/Immunologic: No urticaria.  Resp: No cough, SOB.  Musculoskeletal:  No joint swelling.    Physical Examination: BP 115/55 mmHg  Pulse 65  Temp(Src) 98.3 F (36.8 C) (Oral)  Resp 12  Ht 5\' 7"  (1.702 m)  Wt 95.255 kg (210 lb)  BMI 32.88 kg/m2  SpO2 99% Gen: NAD, alert and oriented x 4 HEENT: PEERLA, EOMI, Neck: supple, no JVD or thyromegaly Chest: CTA bilaterally, no wheezes, crackles, or other adventitious sounds CV: RRR, no m/g/c/r Abd: soft, NT, ND, +BS in all four quadrants; no HSM, guarding, ridigity, or rebound tenderness Ext: no edema, well perfused with 2+ pulses, Skin: no rash or lesions noted Lymph: no LAD  Data: Lab Results  Component Value Date   WBC 8.0 05/11/2015   HGB 8.1* 05/11/2015   HCT 24.8* 05/11/2015   MCV 90.5 05/11/2015   PLT 230 05/11/2015    Recent Labs Lab 05/11/15 0509  HGB 8.1*   Lab Results  Component Value Date   NA 132* 05/11/2015   K 4.9 05/11/2015   CL 109 05/11/2015   CO2 15* 05/11/2015   BUN 47* 05/11/2015   CREATININE 2.70* 05/11/2015   Lab Results  Component Value Date   ALT 33 03/25/2013   AST 26 03/25/2013   ALKPHOS 64 03/25/2013   BILITOT 0.5 03/25/2013   No results for input(s): APTT, INR, PTT in the last 168 hours.  Assessment/Plan: Roger Knight is a 65 y.o. male with a history of diverticulosis, GERD, chronic anemia (followed by hematology), and CKD a/w BRBPR and a syncopal episode this morning. His baseline Hgb is approximately 11, and he is closely followed by hematology d/t chronic anemia.  Patient reports an isolated, moderate-volume bleed filling the commode water during a BM this morning.  He also endorses an episode of the same last week, which resolved without recurrence until this morning.  There has been no further bleeding since admission.  This symptomology is suggestive of a diverticular bleed, and diverticulosis was noted throughout the colon on his last colonsocopy (2014).  If Hgb remains stable without further bleeding, I recommend close outpatient follow-up with [facility]  to monitor  Hgb and schedule an outpatient colonosocopy to exclude malignancy.  I also recommend close follow-up with hematology.  Will continue to monitor labs and make further recommendations prn.  Recommendations: - Monitor Hgb, transfuse if <7 - If Hgb stable in am w/o further bleeding or anemia symptoms, recommend close OP follow-up with me at [facility] to monitor Hgb and schedule colonoscopy  Thank you for the consult. We will follow along with you. Please call with questions or concerns.  Lavera Guise, PA-C [facility] Phone: [phone] Pager: [phone]

## 2015-05-11 NOTE — ED Notes (Signed)
Pt arrives to ED from home with c/o BRBPR. Pt reports a h/x of hemorrhoids; states a significant amount of bright red blood noted this morning in the toilet and tissue paper following a BM. Pt arrives A&Ox4, in NAD, with respirations regular, even and unlabored.

## 2015-05-11 NOTE — ED Provider Notes (Signed)
[facility] Emergency Department Provider Note  ____________________________________________  Time seen: 5:00 AM  I have reviewed the triage vital signs and the nursing notes.   HISTORY  Chief Complaint Rectal Bleeding      HPI Roger Knight is a 65 y.o. male presents with history of bright red blood per rectum tonight while having a bowel movement. Patient states he has a history of hemorrhoids as well as a history of rectal bleeding approximately 2 years ago, no etiology identified at that time. Patient stated he noticed significant amount of red blood in the toilet following the BM this morning. In addition patient admits to an episode last week of bright red blood per rectum. Patient denies any abdominal pain no fever no vomiting or diarrhea     Past Medical History  Diagnosis Date  . Arthritis   . GERD (gastroesophageal reflux disease)   . Diabetes mellitus without complication ([place])   . Hernia   . Anemia   . Hyperlipidemia   . Hypertension   . Carcinoma ([place]) 2012     skin cancer on neck [facility]  . Chronic kidney disease 2016  . Skin cancer   . Accelerating angina ([place]) 02/11/2014    Patient Active Problem List   Diagnosis Date Noted  . Diabetes ([place]) 11/16/2014  . Chronic kidney disease (CKD), stage III (moderate) 10/27/2014  . Arteriosclerosis of coronary artery 10/27/2014  . Acid reflux 10/27/2014  . Diabetic peripheral neuropathy associated with type 2 diabetes mellitus ([place]) 10/27/2014  . Dyslipidemia 10/27/2014  . Disorder of peripheral nervous system ([place]) 10/27/2014  . Anemia 10/13/2014  . Monoclonal gammopathy 10/13/2014  . Long term current use of insulin ([place]) 05/05/2014  . Acquired polyneuropathy ([place]) 05/05/2014  . Abnormal presence of protein in urine 05/05/2014  . Benign essential HTN 02/11/2014  . Accelerating angina ([place]) 02/11/2014  . Arthralgia of hand 12/17/2013  . Neuritis or radiculitis due to rupture of  lumbar intervertebral disc 12/17/2013  . DDD (degenerative disc disease), lumbar 12/17/2013  . Rectal bleeding 04/17/2013  . Acute blood loss anemia 04/17/2013    Past Surgical History  Procedure Laterality Date  . Coronary artery bypass graft  1996    [facility]  . Coronary stent placement  2011    [facility]  . Colonoscopy  April 2014  . Upper gi endoscopy  2014    Current Outpatient Rx  Name  Route  Sig  Dispense  Refill  . amLODipine (NORVASC) 5 MG tablet   Oral   Take 10 mg by mouth daily.          Marland Kitchen aspirin EC 81 MG tablet   Oral   Take 81 mg by mouth daily.         Marland Kitchen atorvastatin (LIPITOR) 80 MG tablet   Oral   Take 80 mg by mouth daily.         . cetirizine (ZYRTEC) 10 MG tablet   Oral   Take 10 mg by mouth daily.         Marland Kitchen ezetimibe (ZETIA) 10 MG tablet   Oral   Take 10 mg by mouth daily.         . ferrous fumarate (HEMOCYTE - 106 MG FE) 325 (106 FE) MG TABS tablet   Oral   Take 1 tablet by mouth.         . FIBER FORMULA PO   Oral   Take by mouth 2 (two) times daily.         Marland Kitchen  glipiZIDE (GLUCOTROL) 10 MG tablet   Oral   Take 10 mg by mouth 2 (two) times daily.         . insulin aspart (NOVOLOG) 100 UNIT/ML injection   Subcutaneous   Inject 25 Units into the skin once. Before supper         . insulin aspart (NOVOLOG) 100 UNIT/ML injection   Subcutaneous   Inject 15 Units into the skin daily at 12 noon.         . insulin aspart (NOVOLOG) 100 UNIT/ML injection   Subcutaneous   Inject 20 Units into the skin daily before breakfast.          . Liraglutide (VICTOZA [place])   Subcutaneous   Inject 1.2 Units into the skin daily.         Marland Kitchen lisinopril (PRINIVIL,ZESTRIL) 20 MG tablet   Oral   Take 20 mg by mouth daily.         . metFORMIN (GLUCOPHAGE) 500 MG tablet   Oral   Take 1,000 mg by mouth 2 (two) times daily with a meal.          . metoprolol (TOPROL-XL) 200 MG 24 hr tablet   Oral   Take 200 mg by mouth daily.         .  Multiple Vitamins-Minerals (MULTIVITAMIN WITH MINERALS) tablet   Oral   Take 1 tablet by mouth daily.         . pantoprazole (PROTONIX) 20 MG tablet   Oral   Take 40 mg by mouth daily.          . pregabalin (LYRICA) 75 MG capsule   Oral   Take 75 mg by mouth 2 (two) times daily. Usually takes 1 before bedtime only         . sildenafil (VIAGRA) 100 MG tablet   Oral   Take 100 mg by mouth as needed for erectile dysfunction.         Marland Kitchen spironolactone (ALDACTONE) 25 MG tablet   Oral   Take 25 mg by mouth 2 (two) times daily.         Marland Kitchen triamcinolone cream (KENALOG) 0.1 %   Topical   Apply 1 application topically 2 (two) times daily.         . valsartan-hydrochlorothiazide (DIOVAN-HCT) 320-25 MG per tablet   Oral   Take 1 tablet by mouth daily.           Allergies No known drug allergies  Family History  Problem Relation Age of Onset  . Dementia Mother 84  . Osteoarthritis Mother   . Cancer Other     skin cancer    Social History Social History  Substance Use Topics  . Smoking status: Former Smoker    Types: Cigarettes    Quit date: 04/24/2005  . Smokeless tobacco: Former Systems developer    Types: Chew  . Alcohol Use: No    Review of Systems  Constitutional: Negative for fever. Eyes: Negative for visual changes. ENT: Negative for sore throat. Cardiovascular: Negative for chest pain. Respiratory: Negative for shortness of breath. Gastrointestinal: Negative for abdominal pain, vomiting and diarrhea. Positive for rectal bleeding Genitourinary: Negative for dysuria. Musculoskeletal: Negative for back pain. Skin: Negative for rash. Neurological: Negative for headaches, focal weakness or numbness.   10-point ROS otherwise negative.  ____________________________________________   PHYSICAL EXAM:  VITAL SIGNS: ED Triage Vitals  Enc Vitals Group     BP 05/11/15 0506 117/70 mmHg  Pulse Rate 05/11/15 0506 61     Resp 05/11/15 0506 18     Temp 05/11/15  0506 97.3 F (36.3 C)     Temp Source 05/11/15 0506 Oral     SpO2 05/11/15 0506 99 %     Weight 05/11/15 0506 210 lb (95.255 kg)     Height 05/11/15 0506 5\' 7"  (1.702 m)     Head Cir --      Peak Flow --      Pain Score 05/11/15 0508 0     Pain Loc --      Pain Edu? --      Excl. in [place]? --      Constitutional: Alert and oriented. Well appearing and in no distress. Eyes: Conjunctivae are normal. PERRL. Normal extraocular movements. ENT   Head: Normocephalic and atraumatic.   Nose: No congestion/rhinnorhea.   Mouth/Throat: Mucous membranes are moist.   Neck: No stridor. Hematological/Lymphatic/Immunilogical: No cervical lymphadenopathy. Cardiovascular: Normal rate, regular rhythm. Normal and symmetric distal pulses are present in all extremities. No murmurs, rubs, or gallops. Respiratory: Normal respiratory effort without tachypnea nor retractions. Breath sounds are clear and equal bilaterally. No wheezes/rales/rhonchi. Gastrointestinal: Soft and nontender. No distention. There is no CVA tenderness. Genitourinary: deferred Musculoskeletal: Nontender with normal range of motion in all extremities. No joint effusions.  No lower extremity tenderness nor edema. Neurologic:  Normal speech and language. No gross focal neurologic deficits are appreciated. Speech is normal.  Skin:  Skin is warm, dry and intact. No rash noted. Psychiatric: Mood and affect are normal. Speech and behavior are normal. Patient exhibits appropriate insight and judgment.  ____________________________________________    LABS (pertinent positives/negatives)  Labs Reviewed  CBC - Abnormal; Notable for the following:    RBC 2.74 (*)    Hemoglobin 8.1 (*)    HCT 24.8 (*)    All other components within normal limits  BASIC METABOLIC PANEL - Abnormal; Notable for the following:    Sodium 132 (*)    CO2 15 (*)    Glucose, Bld 259 (*)    BUN 47 (*)    Creatinine, Ser 2.70 (*)    Calcium 8.0 (*)     GFR calc non Af Amer 23 (*)    GFR calc Af Amer 27 (*)    All other components within normal limits  TYPE AND SCREEN     ____________________________________________   EKG  ED ECG REPORT I, Nikki Rusnak, [place] N, the attending physician, personally viewed and interpreted this ECG.   Date: 05/11/2015  EKG Time: 5:06 AM  Rate: 62  Rhythm: Normal sinus rhythm  Axis: None  Intervals: Normal  ST&T Change: Leads 2 3 and aVF Q waves     INITIAL IMPRESSION / ASSESSMENT AND PLAN / ED COURSE  Pertinent labs & imaging results that were available during my care of the patient were reviewed by me and considered in my medical decision making (see chart for details).  Review of the patient's lab data revealed a hemoglobin of 8.1 comparison hemoglobin from 03/25/2015 12.2. Patient normotensive and with a heart rate of 66 at present. However given considerable acute anemia in the setting of acute blood loss will admit the patient for further evaluation and management. Patient discussed with Dr. Marcille Blanco for hospital admission  ____________________________________________   FINAL CLINICAL IMPRESSION(S) / ED DIAGNOSES  Final diagnoses:  Acute blood loss anemia  Gastrointestinal hemorrhage associated with anorectal source      Gregor Hams, MD 05/11/15  0626 

## 2015-05-11 NOTE — ED Notes (Signed)
Family at bedside. 

## 2015-05-11 NOTE — ED Notes (Signed)
Called pharmacy again regarding hydrocortisone suppository; pharmacy stated it has already been sent. Instructed to check tube system in Flex and Triage (to make sure it wasn't sent there by mistake), and to call back ASAP if it cannot be located.

## 2015-05-11 NOTE — ED Notes (Signed)
Pharmacy called regarding order for hydrocortisone suppository (not found in the main ED Pyxis); pharmacy stated it would be sent to ED as soon as possible.

## 2015-05-11 NOTE — ED Notes (Signed)
MD at bedside to discuss results of lab work.

## 2015-05-12 LAB — BASIC METABOLIC PANEL WITH GFR
Anion gap: 3 — ABNORMAL LOW (ref 5–15)
BUN: 35 mg/dL — ABNORMAL HIGH (ref 6–20)
CO2: 21 mmol/L — ABNORMAL LOW (ref 22–32)
Calcium: 8.5 mg/dL — ABNORMAL LOW (ref 8.9–10.3)
Chloride: 114 mmol/L — ABNORMAL HIGH (ref 101–111)
Creatinine, Ser: 1.84 mg/dL — ABNORMAL HIGH (ref 0.61–1.24)
GFR calc Af Amer: 43 mL/min — ABNORMAL LOW
GFR calc non Af Amer: 37 mL/min — ABNORMAL LOW
Glucose, Bld: 142 mg/dL — ABNORMAL HIGH (ref 65–99)
Potassium: 4.4 mmol/L (ref 3.5–5.1)
Sodium: 138 mmol/L (ref 135–145)

## 2015-05-12 LAB — HEMOGLOBIN: Hemoglobin: 8.4 g/dL — ABNORMAL LOW (ref 13.0–18.0)

## 2015-05-12 LAB — GLUCOSE, CAPILLARY
Glucose-Capillary: 115 mg/dL — ABNORMAL HIGH (ref 65–99)
Glucose-Capillary: 116 mg/dL — ABNORMAL HIGH (ref 65–99)
Glucose-Capillary: 143 mg/dL — ABNORMAL HIGH (ref 65–99)
Glucose-Capillary: 144 mg/dL — ABNORMAL HIGH (ref 65–99)
Glucose-Capillary: 164 mg/dL — ABNORMAL HIGH (ref 65–99)
Glucose-Capillary: 179 mg/dL — ABNORMAL HIGH (ref 65–99)

## 2015-05-12 LAB — CBC WITH DIFFERENTIAL/PLATELET
BASOS ABS: 0 10*3/uL (ref 0–0.1)
BASOS PCT: 1 %
EOS ABS: 0.1 10*3/uL (ref 0–0.7)
Eosinophils Relative: 3 %
HEMATOCRIT: 23.5 % — AB (ref 40.0–52.0)
Hemoglobin: 7.9 g/dL — ABNORMAL LOW (ref 13.0–18.0)
Lymphocytes Relative: 30 %
Lymphs Abs: 1.6 10*3/uL (ref 1.0–3.6)
MCH: 30.3 pg (ref 26.0–34.0)
MCHC: 33.7 g/dL (ref 32.0–36.0)
MCV: 89.8 fL (ref 80.0–100.0)
MONO ABS: 0.4 10*3/uL (ref 0.2–1.0)
MONOS PCT: 8 %
NEUTROS ABS: 3.3 10*3/uL (ref 1.4–6.5)
Neutrophils Relative %: 58 %
PLATELETS: 188 10*3/uL (ref 150–440)
RBC: 2.62 MIL/uL — ABNORMAL LOW (ref 4.40–5.90)
RDW: 13.9 % (ref 11.5–14.5)
WBC: 5.5 10*3/uL (ref 3.8–10.6)

## 2015-05-12 MED ORDER — POLYETHYLENE GLYCOL 3350 17 GM/SCOOP PO POWD
1.0000 | Freq: Once | ORAL | Status: AC
  Administered 2015-05-13: 255 g via ORAL
  Filled 2015-05-12: qty 255

## 2015-05-12 MED ORDER — AMLODIPINE BESYLATE 10 MG PO TABS
10.0000 mg | ORAL_TABLET | Freq: Every day | ORAL | Status: DC
  Administered 2015-05-12 – 2015-05-14 (×2): 10 mg via ORAL
  Filled 2015-05-12 (×2): qty 1

## 2015-05-12 MED ORDER — SODIUM CHLORIDE 0.9 % IV SOLN: INTRAVENOUS | Status: DC

## 2015-05-12 MED ORDER — POLYETHYLENE GLYCOL 3350 17 GM/SCOOP PO POWD
1.0000 | Freq: Once | ORAL | Status: AC
Start: 2015-05-12 — End: 2015-05-12
  Administered 2015-05-12: 255 g via ORAL
  Filled 2015-05-12: qty 255

## 2015-05-12 MED ORDER — PREGABALIN 75 MG PO CAPS
75.0000 mg | ORAL_CAPSULE | Freq: Every day | ORAL | Status: DC
  Administered 2015-05-12 – 2015-05-14 (×3): 75 mg via ORAL
  Filled 2015-05-12 (×3): qty 1

## 2015-05-12 NOTE — Progress Notes (Signed)
[place] [facility] [place] NAME: Roger Knight    MR#:  [id number]  DATE OF BIRTH:  1950-08-26  SUBJECTIVE:  CHIEF COMPLAINT:   Chief Complaint  Patient presents with  . Rectal Bleeding   No further BMs. No abd pain.  REVIEW OF SYSTEMS:    Review of Systems  Constitutional: Negative for fever and chills.  HENT: Negative for sore throat.   Eyes: Negative for blurred vision, double vision and pain.  Respiratory: Negative for cough, hemoptysis, shortness of breath and wheezing.   Cardiovascular: Negative for chest pain, palpitations, orthopnea and leg swelling.  Gastrointestinal: Negative for heartburn, nausea, vomiting, abdominal pain, diarrhea and constipation.  Genitourinary: Negative for dysuria and hematuria.  Musculoskeletal: Negative for back pain and joint pain.  Skin: Negative for rash.  Neurological: Negative for sensory change, speech change, focal weakness and headaches.  Endo/Heme/Allergies: Does not bruise/bleed easily.  Psychiatric/Behavioral: Negative for depression. The patient is not nervous/anxious.       DRUG ALLERGIES:  No Known Allergies  VITALS:  Blood pressure 123/59, pulse 63, temperature 97.7 F (36.5 C), temperature source Oral, resp. rate 23, height 5\' 7"  (1.702 m), weight 91.899 kg (202 lb 9.6 oz), SpO2 98 %.  PHYSICAL EXAMINATION:   Physical Exam  GENERAL:  65 y.o.-year-old patient lying in the bed with no acute distress.  EYES: Pupils equal, round, reactive to light and accommodation. No scleral icterus. Extraocular muscles intact.  HEENT: Head atraumatic, normocephalic. Oropharynx and nasopharynx clear.  NECK:  Supple, no jugular venous distention. No thyroid enlargement, no tenderness.  LUNGS: Normal breath sounds bilaterally, no wheezing, rales, rhonchi. No use of accessory muscles of respiration.  CARDIOVASCULAR: S1, S2 normal. No murmurs, rubs, or gallops.  ABDOMEN: Soft, nontender,  nondistended. Bowel sounds present. No organomegaly or mass.  EXTREMITIES: No cyanosis, clubbing or edema b/l.    NEUROLOGIC: Cranial nerves II through XII are intact. No focal Motor or sensory deficits b/l.   PSYCHIATRIC: The patient is alert and oriented x 3.  SKIN: No obvious rash, lesion, or ulcer.    LABORATORY PANEL:   CBC  Recent Labs Lab 05/12/15 0537 05/12/15 1331  WBC 5.5  --   HGB 7.9* 8.4*  HCT 23.5*  --   PLT 188  --    ------------------------------------------------------------------------------------------------------------------  Chemistries   Recent Labs Lab 05/12/15 0537  NA 138  K 4.4  CL 114*  CO2 21*  GLUCOSE 142*  BUN 35*  CREATININE 1.84*  CALCIUM 8.5*   ------------------------------------------------------------------------------------------------------------------  Cardiac Enzymes No results for input(s): TROPONINI in the last 168 hours. ------------------------------------------------------------------------------------------------------------------  RADIOLOGY:  No results found.   ASSESSMENT AND PLAN:   This is a 65 year old Caucasian male with past medical history of coronary artery disease, chronic kidney disease and diabetes admitted for bright red bleeding per rectum.  1. Lower GI bleed: Painless with Acute blood loss anemia Transfused 1 unit PRBC on 05/11/2015. Hb is stable. Colonoscopy scheduled tomorrow.  2. Coronary artery disease: Stable. I have held aspirin for now.  3. Essential hypertension: Continue amlodipine, metoprolol and Diovan HCT, lisinopril and spironolactone.   4. Diabetes mellitus type 2: Hold metformin as well as Victoza for now. Sliding scale insulin while hospitalized  5. ARF over CKD3 Resolved with IVF   All the records are reviewed and case discussed with Care Management/Social Workerr. Management plans discussed with the patient, family and they are in agreement.  CODE STATUS: FULL  DVT  Prophylaxis:  SCDs  TOTAL TIME TAKING CARE OF THIS PATIENT: 30 minutes.   POSSIBLE D/C IN 1-2 DAYS, DEPENDING ON CLINICAL CONDITION.   Hillary Bow R M.D on 05/12/2015 at 3:22 PM  Between 7am to 6pm - Pager - [phone]  After 6pm go to [email or web address] - password [email or web address] [facility]  [place]  Office  [phone]  CC: Primary care physician; Adrian Prows, MD    Note: This dictation was prepared with Dragon dictation along with smaller phrase technology. Any transcriptional errors that result from this process are unintentional.

## 2015-05-12 NOTE — Clinical Documentation Improvement (Addendum)
  Internal Medicine   BUN 6 - 20 mg/dL 35 (H) 47 (H)       Creatinine, Ser 0.61 - 1.24 mg/dL 1.84 (H) 2.70 (H)        Please provide diagnosis for lab values if appropriate for this admission.  Thank you    Acute Renal Failure/Acute Kidney Injury  Acute Tubular Necrosis  Acute Renal Cortical Necrosis  Acute Renal Medullary Necrosis  Acute on Chronic Renal Failure  Chronic Renal Failure  Other  Clinically Undetermined    Evaluated/Monitored.     Please exercise your independent, professional judgment when responding. A specific answer is not anticipated or expected.   Thank You,  [place] [phone]

## 2015-05-12 NOTE — Progress Notes (Signed)
GI Inpatient Follow-up Note  Patient Identification: Roger Knight is a 65 y.o. male with rectal bleeding, anemia  Subjective:  No more bleeding or stool. Hgb down slightly this am.   No n/v, abd pain, diarrhea.   Scheduled Inpatient Medications:  . sodium chloride   Intravenous Once  . amLODipine  10 mg Oral Daily  . atorvastatin  80 mg Oral q1800  . ezetimibe  10 mg Oral Daily  . ferrous fumarate  1 tablet Oral BID  . valsartan  320 mg Oral Daily   And  . hydrochlorothiazide  25 mg Oral Daily  . insulin aspart  0-9 Units Subcutaneous 6 times per day  . loratadine  10 mg Oral Daily  . metoprolol  200 mg Oral Daily  . multivitamin with minerals  1 tablet Oral Daily  . pantoprazole  40 mg Oral Daily  . [START ON 05/13/2015] polyethylene glycol powder  1 Container Oral Once  . polyethylene glycol powder  1 Container Oral Once  . pregabalin  75 mg Oral Daily  . sodium chloride  3 mL Intravenous Q12H  . spironolactone  25 mg Oral BID WC    Continuous Inpatient Infusions:   . sodium chloride      PRN Inpatient Medications:  acetaminophen **OR** acetaminophen, ondansetron **OR** ondansetron (ZOFRAN) IV, triamcinolone cream  Review of Systems: Constitutional: Weight is stable.  Eyes: No changes in vision. ENT: No oral lesions, sore throat.  GI: see HPI.  Heme/Lymph: No easy bruising.  CV: No chest pain.  GU: No hematuria.  Integumentary: No rashes.  Neuro: No headaches.  Psych: No depression/anxiety.  Endocrine: No heat/cold intolerance.  Allergic/Immunologic: No urticaria.  Resp: No cough, SOB.  Musculoskeletal: No joint swelling.    Physical Examination: BP 123/59 mmHg  Pulse 63  Temp(Src) 97.7 F (36.5 C) (Oral)  Resp 23  Ht 5\' 7"  (1.702 m)  Wt 91.899 kg (202 lb 9.6 oz)  BMI 31.72 kg/m2  SpO2 98% Gen: NAD, alert and oriented x 4 HEENT: PEERLA, EOMI, Neck: supple, no JVD or thyromegaly Chest: CTA bilaterally, no wheezes, crackles, or other  adventitious sounds CV: RRR, no m/g/c/r Abd: soft, NT, ND, +BS in all four quadrants; no HSM, guarding, ridigity, or rebound tenderness Ext: no edema, well perfused with 2+ pulses, Skin: no rash or lesions noted Lymph: no LAD  Data: Lab Results  Component Value Date   WBC 5.5 05/12/2015   HGB 8.4* 05/12/2015   HCT 23.5* 05/12/2015   MCV 89.8 05/12/2015   PLT 188 05/12/2015    Recent Labs Lab 05/11/15 1421 05/12/15 0537 05/12/15 1331  HGB 8.6* 7.9* 8.4*   Lab Results  Component Value Date   NA 138 05/12/2015   K 4.4 05/12/2015   CL 114* 05/12/2015   CO2 21* 05/12/2015   BUN 35* 05/12/2015   CREATININE 1.84* 05/12/2015   Lab Results  Component Value Date   ALT 33 03/25/2013   AST 26 03/25/2013   ALKPHOS 64 03/25/2013   BILITOT 0.5 03/25/2013   No results for input(s): APTT, INR, PTT in the last 168 hours.   Assessment/Plan: Roger Knight is a 65 y.o. male with LGIB, now resolved with some drop in Hgb.   Will perform EGD and colon given syncope, large drop in Hgb which may not all be expalined by lower source.   Recommendations: - EGD and colon tomorrow late am - miralax 255 gr tonight and again 255 gr in am - start morning miralax  at 6 am,  Must be finished npo by 9 am.   Please call with questions or concerns.  Yukiko Minnich, Grace Blight, MD

## 2015-05-12 NOTE — Progress Notes (Signed)
Plan of care discussed with Dr. Rayann Heman.  Patient to remain on clear liquids today and NPO after midnight with plans for colonoscopy tomorrow (05/13/2015).

## 2015-05-13 ENCOUNTER — Inpatient Hospital Stay: Payer: 59 | Admitting: Anesthesiology

## 2015-05-13 ENCOUNTER — Encounter
Admission: EM | Disposition: A | Discharge status: Home / Self Care | Payer: Self-pay | Source: Home / Self Care | Attending: Internal Medicine

## 2015-05-13 HISTORY — PX: ESOPHAGOGASTRODUODENOSCOPY (EGD) WITH PROPOFOL: SHX5813

## 2015-05-13 HISTORY — PX: COLONOSCOPY: SHX5424

## 2015-05-13 LAB — BASIC METABOLIC PANEL
ANION GAP: 7 (ref 5–15)
BUN: 25 mg/dL — ABNORMAL HIGH (ref 6–20)
CALCIUM: 8.5 mg/dL — AB (ref 8.9–10.3)
CO2: 22 mmol/L (ref 22–32)
CREATININE: 1.7 mg/dL — AB (ref 0.61–1.24)
Chloride: 108 mmol/L (ref 101–111)
GFR calc non Af Amer: 41 mL/min — ABNORMAL LOW (ref >60)
GFR, EST AFRICAN AMERICAN: 47 mL/min — AB (ref >60)
Glucose, Bld: 146 mg/dL — ABNORMAL HIGH (ref 65–99)
Potassium: 4.2 mmol/L (ref 3.5–5.1)
SODIUM: 137 mmol/L (ref 135–145)

## 2015-05-13 LAB — GLUCOSE, CAPILLARY
Glucose-Capillary: 122 mg/dL — ABNORMAL HIGH (ref 65–99)
Glucose-Capillary: 126 mg/dL — ABNORMAL HIGH (ref 65–99)
Glucose-Capillary: 143 mg/dL — ABNORMAL HIGH (ref 65–99)
Glucose-Capillary: 146 mg/dL — ABNORMAL HIGH (ref 65–99)
Glucose-Capillary: 226 mg/dL — ABNORMAL HIGH (ref 65–99)
Glucose-Capillary: 230 mg/dL — ABNORMAL HIGH (ref 65–99)
Glucose-Capillary: 245 mg/dL — ABNORMAL HIGH (ref 65–99)

## 2015-05-13 LAB — CBC
HEMATOCRIT: 25.8 % — AB (ref 40.0–52.0)
HEMOGLOBIN: 8.6 g/dL — AB (ref 13.0–18.0)
MCH: 29.8 pg (ref 26.0–34.0)
MCHC: 33.2 g/dL (ref 32.0–36.0)
MCV: 89.7 fL (ref 80.0–100.0)
Platelets: 223 10*3/uL (ref 150–440)
RBC: 2.88 MIL/uL — ABNORMAL LOW (ref 4.40–5.90)
RDW: 14.1 % (ref 11.5–14.5)
WBC: 7.8 10*3/uL (ref 3.8–10.6)

## 2015-05-13 LAB — PREPARE RBC (CROSSMATCH)

## 2015-05-13 LAB — HEMOGLOBIN: HEMOGLOBIN: 8.5 g/dL — AB (ref 13.0–18.0)

## 2015-05-13 LAB — HEMOGLOBIN AND HEMATOCRIT, BLOOD
HCT: 21.1 % — ABNORMAL LOW (ref 40.0–52.0)
HEMOGLOBIN: 7.1 g/dL — AB (ref 13.0–18.0)

## 2015-05-13 SURGERY — COLONOSCOPY
Anesthesia: General

## 2015-05-13 MED ORDER — SODIUM CHLORIDE 0.9 % IV SOLN
INTRAVENOUS | Status: AC
  Administered 2015-05-13 – 2015-05-14 (×3): via INTRAVENOUS

## 2015-05-13 MED ORDER — PHENYLEPHRINE HCL 10 MG/ML IJ SOLN
INTRAMUSCULAR | Status: DC | PRN
  Administered 2015-05-13 (×2): 200 ug via INTRAVENOUS
  Administered 2015-05-13: 20 ug via INTRAVENOUS
  Administered 2015-05-13: 200 ug via INTRAVENOUS

## 2015-05-13 MED ORDER — SODIUM CHLORIDE 0.9 % IV SOLN
Freq: Once | INTRAVENOUS | Status: AC
  Administered 2015-05-13: 10:00:00 via INTRAVENOUS

## 2015-05-13 MED ORDER — SODIUM CHLORIDE 0.9 % IV SOLN: Freq: Once | INTRAVENOUS | Status: DC

## 2015-05-13 MED ORDER — PROPOFOL 500 MG/50ML IV EMUL
INTRAVENOUS | Status: DC | PRN
  Administered 2015-05-13: 150 ug/kg/min via INTRAVENOUS

## 2015-05-13 MED ORDER — PROPOFOL 10 MG/ML IV BOLUS
INTRAVENOUS | Status: DC | PRN
  Administered 2015-05-13 (×2): 50 mg via INTRAVENOUS

## 2015-05-13 MED ORDER — EPHEDRINE SULFATE 50 MG/ML IJ SOLN
INTRAMUSCULAR | Status: DC | PRN
  Administered 2015-05-13: 15 mg via INTRAVENOUS

## 2015-05-13 NOTE — Progress Notes (Signed)
Rapid Response called to room 216 at 0912. Shift Coordinator with patient, states patient had been on toilet having BM/Bloody stools got weak and almost passed out. Wife was in bathroom with patient at that time and called for help. She had assisted him down to floor and he was on his knees. Patient denied hitting head. No obvious injury noted. Full ROM. Patient was assisted to [facility] and then to bed. Placed in trendlenberg position. No LOC. Skin pale and dry. M.D. notified by Shift Coordinator- BP 63/37 HR 73 pulse oxi 100%. NS fluid bolus started per protocol.  Second IV started in RFA. Telemetry NSR HR 60s-70s. Patient denied chest pain or SOB. BP rechecked- 133/56 HR 65. Telemetry clerk reported 3.25 sec. pause during the time patient was on toilet in bathroom. M.D. informed. Stat 12 Lead EKG ordered.  Instructed Shift Coordinator to keep me informed of patient's status.

## 2015-05-13 NOTE — Op Note (Signed)
[facility] Gastroenterology Patient Name: Roger Knight Procedure Date: 05/13/2015 1:54 PM MRN: [id number] Account #: [id number] Date of Birth: 02/14/1951 Admit Type: Inpatient Age: 65 Room: [facility] ENDO ROOM 2 Gender: Male Note Status: Finalized Procedure:         Colonoscopy Indications:       Rectal bleeding, Acute post hemorrhagic anemia Patient Profile:   This is a 65 year old male. Providers:         Gerrit Heck. Rayann Heman, MD Referring MD:      Youlanda Roys. Ola Spurr, MD (Referring MD) Medicines:         Propofol per Anesthesia Complications:     No immediate complications. Procedure:         Pre-Anesthesia Assessment:                    - Prior to the procedure, a History and Physical was                     performed, and patient medications, allergies and                     sensitivities were reviewed. The patient's tolerance of                     previous anesthesia was reviewed.                    After obtaining informed consent, the colonoscope was                     passed under direct vision. Throughout the procedure, the                     patient's blood pressure, pulse, and oxygen saturations                     were monitored continuously. The Colonoscope was                     introduced through the anus and advanced to the the                     terminal ileum. The colonoscopy was performed without                     difficulty. The patient tolerated the procedure well. The                     quality of the bowel preparation was fair. Findings:      The perianal exam findings include non-thrombosed external hemorrhoids.      A few large-mouthed diverticula were found in the transverse colon and       in the ascending colon.      A few small and large-mouthed diverticula were found in the sigmoid       colon.      A moderate amount of liquid stool was found in the entire colon, making       visualization difficult.      The terminal ileum  appeared normal.      The exam was otherwise without abnormality on direct and retroflexion       views.      - No blood, blood clots, blood specs whatsover despite heavy rectal       bleeding this am  after completing prep. Impression:        - Non-thrombosed external hemorrhoids found on perianal                     exam.                    - Diverticulosis in the transverse colon and in the                     ascending colon.                    - Diverticulosis in the sigmoid colon.                    - Stool in the entire examined colon.                    - The examined portion of the ileum was normal.                    - The examination was otherwise normal on direct and                     retroflexion views.                    - No specimens collected.                    - No blood, blood clots, blood specs whatsover despite                     heavy rectal bleeding this m after completing prep,                     suggestive of external hemorrhoidal bleeding.                    - Likely source of bleeding is hemorrhoidal.                    - Repeat colon on prev scheduled interval, todays colon                     does not change that interval due to inadequte prep for                     screening. Recommendation:    - Perform an upper GI endoscopy today to exclude upper                     source although likely low yield.                    - Surgical referral for treatment of hemorrhoids.                    - The findings and recommendations were discussed with the                     patient.                    - The findings and recommendations were discussed with the                     patient's family. Procedure Code(s): --- Professional ---  45378, Colonoscopy, flexible; diagnostic, including                     collection of specimen(s) by brushing or washing, when                     performed (separate procedure) Diagnosis Code(s): ---  Professional ---                    K64.4, Residual hemorrhoidal skin tags                    K62.5, Hemorrhage of anus and rectum                    D62, Acute posthemorrhagic anemia                    K57.30, Diverticulosis of large intestine without                     perforation or abscess without bleeding CPT copyright 2014 [employer]. All rights reserved. The codes documented in this report are preliminary and upon coder review may  be revised to meet current compliance requirements. Mellody Life, MD 05/13/2015 2:58:56 PM This report has been signed electronically. Number of Addenda: 0 Note Initiated On: 05/13/2015 1:54 PM Scope Withdrawal Time: 0 hours 13 minutes 7 seconds  Total Procedure Duration: 0 hours 17 minutes 50 seconds       [facility]

## 2015-05-13 NOTE — Progress Notes (Signed)
GI Note:  Colon today: large ext hemorrhoids,  No blood whatsoever in colon despit heavy bleeding this am after finishing prep. EGD:   7 mm pre-pyloric polyp needs removal in few months once bleeding and anemia resolved.   Recs; - needs surgical treatment of bleeding ext hemorrrhoids - safe for d/c one hgb stable and no further bleeding.

## 2015-05-13 NOTE — Progress Notes (Signed)
Patient went for his colonoscopy this afternoon, and they also performed and upper endoscopy as well.  Procedure showed it was hemorrhoids causing the bleeding.  Tolerated his regular diet after the procedure.  HGB dropped to 7.1, MD paged waiting call back.

## 2015-05-13 NOTE — Progress Notes (Signed)
Md notified of pt elevated temp 101.2 pre blood transfusion. MD is okay with patient getting 1 unit of blood now.

## 2015-05-13 NOTE — H&P (View-Only) (Signed)
GI Inpatient Follow-up Note  Patient Identification: Roger Knight is a 65 y.o. male with rectal bleeding, anemia  Subjective:  No more bleeding or stool. Hgb down slightly this am.   No n/v, abd pain, diarrhea.   Scheduled Inpatient Medications:  . sodium chloride   Intravenous Once  . amLODipine  10 mg Oral Daily  . atorvastatin  80 mg Oral q1800  . ezetimibe  10 mg Oral Daily  . ferrous fumarate  1 tablet Oral BID  . valsartan  320 mg Oral Daily   And  . hydrochlorothiazide  25 mg Oral Daily  . insulin aspart  0-9 Units Subcutaneous 6 times per day  . loratadine  10 mg Oral Daily  . metoprolol  200 mg Oral Daily  . multivitamin with minerals  1 tablet Oral Daily  . pantoprazole  40 mg Oral Daily  . [START ON 05/13/2015] polyethylene glycol powder  1 Container Oral Once  . polyethylene glycol powder  1 Container Oral Once  . pregabalin  75 mg Oral Daily  . sodium chloride  3 mL Intravenous Q12H  . spironolactone  25 mg Oral BID WC    Continuous Inpatient Infusions:   . sodium chloride      PRN Inpatient Medications:  acetaminophen **OR** acetaminophen, ondansetron **OR** ondansetron (ZOFRAN) IV, triamcinolone cream  Review of Systems: Constitutional: Weight is stable.  Eyes: No changes in vision. ENT: No oral lesions, sore throat.  GI: see HPI.  Heme/Lymph: No easy bruising.  CV: No chest pain.  GU: No hematuria.  Integumentary: No rashes.  Neuro: No headaches.  Psych: No depression/anxiety.  Endocrine: No heat/cold intolerance.  Allergic/Immunologic: No urticaria.  Resp: No cough, SOB.  Musculoskeletal: No joint swelling.    Physical Examination: BP 123/59 mmHg  Pulse 63  Temp(Src) 97.7 F (36.5 C) (Oral)  Resp 23  Ht 5\' 7"  (1.702 m)  Wt 91.899 kg (202 lb 9.6 oz)  BMI 31.72 kg/m2  SpO2 98% Gen: NAD, alert and oriented x 4 HEENT: PEERLA, EOMI, Neck: supple, no JVD or thyromegaly Chest: CTA bilaterally, no wheezes, crackles, or other  adventitious sounds CV: RRR, no m/g/c/r Abd: soft, NT, ND, +BS in all four quadrants; no HSM, guarding, ridigity, or rebound tenderness Ext: no edema, well perfused with 2+ pulses, Skin: no rash or lesions noted Lymph: no LAD  Data: Lab Results  Component Value Date   WBC 5.5 05/12/2015   HGB 8.4* 05/12/2015   HCT 23.5* 05/12/2015   MCV 89.8 05/12/2015   PLT 188 05/12/2015    Recent Labs Lab 05/11/15 1421 05/12/15 0537 05/12/15 1331  HGB 8.6* 7.9* 8.4*   Lab Results  Component Value Date   NA 138 05/12/2015   K 4.4 05/12/2015   CL 114* 05/12/2015   CO2 21* 05/12/2015   BUN 35* 05/12/2015   CREATININE 1.84* 05/12/2015   Lab Results  Component Value Date   ALT 33 03/25/2013   AST 26 03/25/2013   ALKPHOS 64 03/25/2013   BILITOT 0.5 03/25/2013   No results for input(s): APTT, INR, PTT in the last 168 hours.   Assessment/Plan: Roger Knight is a 65 y.o. male with LGIB, now resolved with some drop in Hgb.   Will perform EGD and colon given syncope, large drop in Hgb which may not all be expalined by lower source.   Recommendations: - EGD and colon tomorrow late am - miralax 255 gr tonight and again 255 gr in am - start morning miralax  at 6 am,  Must be finished npo by 9 am.   Please call with questions or concerns.  REIN, Grace Blight, MD

## 2015-05-13 NOTE — Progress Notes (Signed)
Patient's bathroom light was going off, the NT went in and the patient was sitting on the floor and wife was in there with him.  She said "he went to the bathroom said he had a large bloody stool, but felt dizzy afterward, the wife went in to help him get back to bed."  She said his legs gave out and he went down and leaned over on the trash can.  He was sweating and clammy, BP was 67/37 HR 65.  After he sat there for a few minutes we were able to stand him up, he felt weak so we sat him on the [facility]  While there he went unresponsive for a few seconds but then came right back around.  A rapid response was called.  We were able to get him back to the bed.  He was still bleeding.  B/P went up to 133/56 HR 60.  NS bolus was given.  Telemetry clerk called and said he had a 3.25 second pause on the monitor.  Called Dr. Rayann Heman to notify him.  Notified Dr. Margaretmary Eddy and she ordered a CBC and EKG.  Dr. Margaretmary Eddy was notified that the hgb 8.6, EKG was abnormal, BP 115/45, and pt has no fluids ordered.  She said she would put orders in.  Patient is in the room resting now.

## 2015-05-13 NOTE — Consult Note (Signed)
Reason for Consult: preoperative clearance for EGD and scope  Referring Physician:  Dr. Marcille Blanco  hospitalist  cardiologists  [name] is an 65 y.o. male.  HPI:  19 year all known coronary disease coronary bypass surgery diabetes hypertension hyperlipidemia renal insufficiency recent anemia now with rectal bleeding. Patient had an episode of syncope and severe bradycardia appear to be vagal when she stood up and continued to pass excessive  amount of blood.  The patient has a significant rectal bleeding over the last few days and now is preop but had an episode when he stood up to go the bathroom that he had severe bradycardia pauses and syncope. The patient denies any chest pain has history of hypertension diabetes blood sugars breath okay. Denies any palpitations tachycardia the previous history of  Syncope. The patient now feels reasonably well blood pressure is still low he was transfused 1 unit of starting of the hemoglobin 12 got down to 8.  Denies any chest pain shortness of breath angina still has significant bleeding. Denies any nausea Roger Knight pursue hematemesis  Past Medical History  Diagnosis Date  . Arthritis   . GERD (gastroesophageal reflux disease)   . Diabetes mellitus without complication ([place])   . Hernia   . Anemia   . Hyperlipidemia   . Hypertension   . Carcinoma ([place]) 2012     skin cancer on neck [facility]  . Chronic kidney disease 2016  . Skin cancer   . Accelerating angina ([place]) 02/11/2014    Past Surgical History  Procedure Laterality Date  . Coronary artery bypass graft  1996    [facility]  . Coronary stent placement  2011    [facility]  . Colonoscopy  April 2014  . Upper gi endoscopy  2014    Family History  Problem Relation Age of Onset  . Dementia Mother 25  . Osteoarthritis Mother   . Cancer Other     skin cancer    Social History:  reports that he quit smoking about 10 years ago. His smoking use included Cigarettes. He has quit using smokeless  tobacco. His smokeless tobacco use included Chew. He reports that he does not drink alcohol or use illicit drugs.  Allergies: No Known Allergies  Medications: I have reviewed the patient's current medications.  Results for orders placed or performed during the hospital encounter of 05/11/15 (from the past 48 hour(s))  Hemoglobin     Status: Abnormal   Collection Time: 05/11/15  2:21 PM  Result Value Ref Range   Hemoglobin 8.6 (L) 13.0 - 18.0 g/dL  Glucose, capillary     Status: Abnormal   Collection Time: 05/11/15  4:12 PM  Result Value Ref Range   Glucose-Capillary 131 (H) 65 - 99 mg/dL  Glucose, capillary     Status: Abnormal   Collection Time: 05/11/15  8:10 PM  Result Value Ref Range   Glucose-Capillary 113 (H) 65 - 99 mg/dL   Comment 1 Notify RN   Glucose, capillary     Status: Abnormal   Collection Time: 05/12/15 12:03 AM  Result Value Ref Range   Glucose-Capillary 116 (H) 65 - 99 mg/dL   Comment 1 Notify RN   Glucose, capillary     Status: Abnormal   Collection Time: 05/12/15  4:08 AM  Result Value Ref Range   Glucose-Capillary 115 (H) 65 - 99 mg/dL   Comment 1 Notify RN   Basic metabolic panel     Status: Abnormal  Collection Time: 05/12/15  5:37 AM  Result Value Ref Range   Sodium 138 135 - 145 mmol/L   Potassium 4.4 3.5 - 5.1 mmol/L   Chloride 114 (H) 101 - 111 mmol/L   CO2 21 (L) 22 - 32 mmol/L   Glucose, Bld 142 (H) 65 - 99 mg/dL   BUN 35 (H) 6 - 20 mg/dL   Creatinine, Ser 1.84 (H) 0.61 - 1.24 mg/dL   Calcium 8.5 (L) 8.9 - 10.3 mg/dL   GFR calc non Af Amer 37 (L) >60 mL/min   GFR calc Af Amer 43 (L) >60 mL/min    Comment: (NOTE) The eGFR has been calculated using the CKD EPI equation. This calculation has not been validated in all clinical situations. eGFR's persistently <60 mL/min signify possible Chronic Kidney Disease.    Anion gap 3 (L) 5 - 15  CBC with Differential/Platelet     Status: Abnormal   Collection Time: 05/12/15  5:37 AM  Result Value  Ref Range   WBC 5.5 3.8 - 10.6 K/uL   RBC 2.62 (L) 4.40 - 5.90 MIL/uL   Hemoglobin 7.9 (L) 13.0 - 18.0 g/dL   HCT 23.5 (L) 40.0 - 52.0 %   MCV 89.8 80.0 - 100.0 fL   MCH 30.3 26.0 - 34.0 pg   MCHC 33.7 32.0 - 36.0 g/dL   RDW 13.9 11.5 - 14.5 %   Platelets 188 150 - 440 K/uL   Neutrophils Relative % 58 %   Neutro Abs 3.3 1.4 - 6.5 K/uL   Lymphocytes Relative 30 %   Lymphs Abs 1.6 1.0 - 3.6 K/uL   Monocytes Relative 8 %   Monocytes Absolute 0.4 0.2 - 1.0 K/uL   Eosinophils Relative 3 %   Eosinophils Absolute 0.1 0 - 0.7 K/uL   Basophils Relative 1 %   Basophils Absolute 0.0 0 - 0.1 K/uL  Glucose, capillary     Status: Abnormal   Collection Time: 05/12/15  7:30 AM  Result Value Ref Range   Glucose-Capillary 143 (H) 65 - 99 mg/dL   Comment 1 Notify RN   Glucose, capillary     Status: Abnormal   Collection Time: 05/12/15 12:17 PM  Result Value Ref Range   Glucose-Capillary 164 (H) 65 - 99 mg/dL  Hemoglobin     Status: Abnormal   Collection Time: 05/12/15  1:31 PM  Result Value Ref Range   Hemoglobin 8.4 (L) 13.0 - 18.0 g/dL  Glucose, capillary     Status: Abnormal   Collection Time: 05/12/15  4:09 PM  Result Value Ref Range   Glucose-Capillary 144 (H) 65 - 99 mg/dL  Glucose, capillary     Status: Abnormal   Collection Time: 05/12/15  8:15 PM  Result Value Ref Range   Glucose-Capillary 179 (H) 65 - 99 mg/dL   Comment 1 Notify RN   Glucose, capillary     Status: Abnormal   Collection Time: 05/13/15 12:23 AM  Result Value Ref Range   Glucose-Capillary 146 (H) 65 - 99 mg/dL   Comment 1 Notify RN   Glucose, capillary     Status: Abnormal   Collection Time: 05/13/15  4:43 AM  Result Value Ref Range   Glucose-Capillary 126 (H) 65 - 99 mg/dL   Comment 1 Notify RN   Hemoglobin     Status: Abnormal   Collection Time: 05/13/15  5:13 AM  Result Value Ref Range   Hemoglobin 8.5 (L) 13.0 - 18.0 g/dL  Basic metabolic panel  Status: Abnormal   Collection Time: 05/13/15  5:13 AM   Result Value Ref Range   Sodium 137 135 - 145 mmol/L   Potassium 4.2 3.5 - 5.1 mmol/L   Chloride 108 101 - 111 mmol/L   CO2 22 22 - 32 mmol/L   Glucose, Bld 146 (H) 65 - 99 mg/dL   BUN 25 (H) 6 - 20 mg/dL   Creatinine, Ser 1.70 (H) 0.61 - 1.24 mg/dL   Calcium 8.5 (L) 8.9 - 10.3 mg/dL   GFR calc non Af Amer 41 (L) >60 mL/min   GFR calc Af Amer 47 (L) >60 mL/min    Comment: (NOTE) The eGFR has been calculated using the CKD EPI equation. This calculation has not been validated in all clinical situations. eGFR's persistently <60 mL/min signify possible Chronic Kidney Disease.    Anion gap 7 5 - 15  Glucose, capillary     Status: Abnormal   Collection Time: 05/13/15  7:56 AM  Result Value Ref Range   Glucose-Capillary 245 (H) 65 - 99 mg/dL  Glucose, capillary     Status: Abnormal   Collection Time: 05/13/15  9:36 AM  Result Value Ref Range   Glucose-Capillary 226 (H) 65 - 99 mg/dL   Comment 1 Document in Chart   CBC     Status: Abnormal   Collection Time: 05/13/15  9:53 AM  Result Value Ref Range   WBC 7.8 3.8 - 10.6 K/uL   RBC 2.88 (L) 4.40 - 5.90 MIL/uL   Hemoglobin 8.6 (L) 13.0 - 18.0 g/dL   HCT 25.8 (L) 40.0 - 52.0 %   MCV 89.7 80.0 - 100.0 fL   MCH 29.8 26.0 - 34.0 pg   MCHC 33.2 32.0 - 36.0 g/dL   RDW 14.1 11.5 - 14.5 %   Platelets 223 150 - 440 K/uL  Glucose, capillary     Status: Abnormal   Collection Time: 05/13/15 11:50 AM  Result Value Ref Range   Glucose-Capillary 122 (H) 65 - 99 mg/dL    No results found.  Review of Systems  Constitutional: Positive for malaise/fatigue.  HENT: Negative.   Eyes: Negative.   Respiratory: Negative.   Cardiovascular: Negative.   Gastrointestinal: Positive for nausea, blood in stool and melena.  Genitourinary: Negative.   Musculoskeletal: Negative.   Skin: Negative.   Neurological: Positive for dizziness, loss of consciousness and weakness.  Endo/Heme/Allergies: Negative.   Psychiatric/Behavioral: Negative.    Blood  pressure 115/54, pulse 78, temperature 98.5 F (36.9 C), temperature source Oral, resp. rate 16, height 5' 7"  (1.702 m), weight 90.175 kg (198 lb 12.8 oz), SpO2 95 %. Physical Exam  Vitals reviewed. Constitutional: He is oriented to person, place, and time. He appears well-developed and well-nourished.  HENT:  Head: Normocephalic and atraumatic.  Eyes: Conjunctivae and EOM are normal. Pupils are equal, round, and reactive to light.  Neck: Normal range of motion. Neck supple.  Cardiovascular: Normal rate, regular rhythm and normal heart sounds.   Respiratory: Effort normal and breath sounds normal.  GI: Soft. Bowel sounds are normal.  Musculoskeletal: Normal range of motion.  Neurological: He is alert and oriented to person, place, and time. He has normal reflexes.  Skin: Skin is warm and dry.  Psychiatric: He has a normal mood and affect. His behavior is normal. Judgment and thought content normal.    Assessment/Plan:  syncope/ vasovagal  bradycardia/ pauses  coronary disease  anemia from acute blood loss  rectal bleeding  chronic renal sufficiency states  3  diabetes  GERD  angina  hyperlipidemia  hypertension . PLAN  continue blood pressure support  fluid resuscitation  transfuse to keep hemoglobin at least 8  patient appears to be an acceptable surgical risk and is cleared for EGD and scope  hold aspirin therapy because of bleeding  reduce or hold blood pressure medications including metoprolol  continue diabetes management and control  lipid management with Lipitor  hypertension control but will reduce medications temporarily including amlodipine hydrochlorothiazide        metoprolol spironolactone  continue Protonix therapy for reflux symptoms   Roger Knight D. 05/13/2015, 12:38 PM

## 2015-05-13 NOTE — Op Note (Signed)
[facility] Patient Name: Nand Pontiff Procedure Date: 05/13/2015 2:59 PM MRN: [id number] Account #: [id number] Date of Birth: 09-11-1950 Admit Type: Inpatient Age: 65 Room: [facility] ENDO ROOM 2 Gender: Male Note Status: Finalized Procedure:         Upper GI endoscopy Indications:       Acute post hemorrhagic anemia, Hematochezia Patient Profile:   This is a 65 year old male. Providers:         Gerrit Heck. Rayann Heman, MD Referring MD:      Youlanda Roys. Ola Spurr, MD (Referring MD) Medicines:         Propofol per Anesthesia Complications:     No immediate complications. Procedure:         Pre-Anesthesia Assessment:                    - Prior to the procedure, a History and Physical was                     performed, and patient medications, allergies and                     sensitivities were reviewed. The patient's tolerance of                     previous anesthesia was reviewed.                    After obtaining informed consent, the endoscope was passed                     under direct vision. Throughout the procedure, the                     patient's blood pressure, pulse, and oxygen saturations                     were monitored continuously. The Endoscope was introduced                     through the mouth, and advanced to the second part of                     duodenum. Findings:      A small hiatus hernia was present.      The esophagus was normal.      A few 2 to 3 mm sessile polyps with no bleeding and no stigmata of       recent bleeding were found in the gastric body.      A single 7 mm pedunculated polyp with no bleeding and no stigmata of       recent bleeding was found in the prepyloric region of the stomach. Not       the source of bleeeding      The examined duodenum was normal. Impression:        - Small hiatus hernia.                    - Normal esophagus.                    - A few gastric polyps.                    - A single  pre-pyloric penduculated polyp, 7 mm in size.  Not the source of bleeding. Not removed today to avoid                     confounding GI bleeding picture.                    - Normal examined duodenum.                    - No specimens collected. Recommendation:    - Observe patient in GI recovery unit.                    - Clear liquid diet.                    - Continue present medications.                    - Repeat the upper endoscopy at appointment to be                     scheduled for polyp removal.                    - The findings and recommendations were discussed with the                     patient.                    - The findings and recommendations were discussed with the                     patient's family. Procedure Code(s): --- Professional ---                    [id number], Esophagogastroduodenoscopy, flexible, transoral;                     diagnostic, including collection of specimen(s) by                     brushing or washing, when performed (separate procedure) Diagnosis Code(s): --- Professional ---                    K31.7, Polyp of stomach and duodenum                    D62, Acute posthemorrhagic anemia                    K92.1, Melena CPT copyright 2014 American Medical Association. All rights reserved. The codes documented in this report are preliminary and upon coder review may  be revised to meet current compliance requirements. Mellody Life, MD 05/13/2015 3:31:59 PM This report has been signed electronically. Number of Addenda: 0 Note Initiated On: 05/13/2015 2:59 PM      [facility]

## 2015-05-13 NOTE — Progress Notes (Signed)
Pastoral Care and prayer provided for family. Patient being attended to.

## 2015-05-13 NOTE — Anesthesia Preprocedure Evaluation (Signed)
Anesthesia Evaluation  Patient identified by MRN, date of birth, ID band Patient awake    Reviewed: Allergy & Precautions, NPO status , Patient's Chart, lab work & pertinent test results  History of Anesthesia Complications Negative for: history of anesthetic complications  Airway Mallampati: II       Dental  (+) Partial Upper, Missing   Pulmonary neg pulmonary ROS, former smoker,           Cardiovascular hypertension, Pt. on medications + angina + CAD, + Cardiac Stents and + CABG       Neuro/Psych  Neuromuscular disease negative neurological ROS     GI/Hepatic Neg liver ROS, GERD  Medicated,  Endo/Other  diabetes, Oral Hypoglycemic Agents  Renal/GU Renal InsufficiencyRenal disease     Musculoskeletal   Abdominal   Peds  Hematology  (+) anemia ,   Anesthesia Other Findings   Reproductive/Obstetrics                             Anesthesia Physical Anesthesia Plan  ASA: III  Anesthesia Plan: General   Post-op Pain Management:    Induction: Intravenous  Airway Management Planned: Nasal Cannula  Additional Equipment:   Intra-op Plan:   Post-operative Plan:   Informed Consent: I have reviewed the patients History and Physical, chart, labs and discussed the procedure including the risks, benefits and alternatives for the proposed anesthesia with the patient or authorized representative who has indicated his/her understanding and acceptance.     Plan Discussed with:   Anesthesia Plan Comments:         Anesthesia Quick Evaluation

## 2015-05-13 NOTE — Anesthesia Postprocedure Evaluation (Signed)
Anesthesia Post Note  Patient: Roger Knight  Procedure(s) Performed: Procedure(s) (LRB): COLONOSCOPY (N/A) ESOPHAGOGASTRODUODENOSCOPY (EGD) WITH PROPOFOL (N/A)  Patient location during evaluation: Endoscopy Anesthesia Type: General Level of consciousness: awake and alert Pain management: pain level controlled Vital Signs Assessment: post-procedure vital signs reviewed and stable Respiratory status: spontaneous breathing and respiratory function stable Cardiovascular status: stable Anesthetic complications: no    Last Vitals:  Filed Vitals:   05/13/15 1525 05/13/15 1535  BP: 112/59 102/56  Pulse: 83 80  Temp:    Resp: 13 16    Last Pain:  Filed Vitals:   05/13/15 1538  PainSc: 2                  KEPHART,WILLIAM K

## 2015-05-13 NOTE — Anesthesia Procedure Notes (Signed)
Date/Time: 05/13/2015 2:42 PM Performed by: Nelda Marseille Pre-anesthesia Checklist: Patient identified, Emergency Drugs available, Suction available, Patient being monitored and Timeout performed Oxygen Delivery Method: Simple face mask

## 2015-05-13 NOTE — Transfer of Care (Signed)
Immediate Anesthesia Transfer of Care Note  Patient: Roger Knight  Procedure(s) Performed: Procedure(s): COLONOSCOPY (N/A) ESOPHAGOGASTRODUODENOSCOPY (EGD) WITH PROPOFOL (N/A)  Patient Location: PACU  Anesthesia Type:General  Level of Consciousness: sedated  Airway & Oxygen Therapy: Patient Spontanous Breathing and Patient connected to nasal cannula oxygen  Post-op Assessment: Report given to RN and Post -op Vital signs reviewed and stable  Post vital signs: Reviewed and stable  Last Vitals:  Filed Vitals:   05/13/15 1244 05/13/15 1308  BP: 94/49 112/56  Pulse: 47 78  Temp: 38.2 C 38.1 C  Resp: 16 18    Complications: No apparent anesthesia complications

## 2015-05-13 NOTE — Interval H&P Note (Signed)
History and Physical Interval Note:  05/13/2015 2:24 PM  Roger Knight  has presented today for surgery, with the diagnosis of lower gi bleeding, anemia  The various methods of treatment have been discussed with the patient and family. After consideration of risks, benefits and other options for treatment, the patient has consented to  Procedure(s): COLONOSCOPY (N/A) ESOPHAGOGASTRODUODENOSCOPY (EGD) WITH PROPOFOL (N/A) as a surgical intervention .  The patient's history has been reviewed, patient examined, no change in status, stable for surgery.  I have reviewed the patient's chart and labs.  Questions were answered to the patient's satisfaction.     Roger Knight

## 2015-05-13 NOTE — Progress Notes (Signed)
[place] at [place] NAME: Roger Knight    MR#:  [id number]  DATE OF BIRTH:  March 14, 1951  SUBJECTIVE:  CHIEF COMPLAINT:  Patient is resting comfortably during my examination in a.m. Reports 2 bowel movements with blood. Eventually he had a syncopal episode and EKG has revealed 3.5 second pause. Patient was hypotensive and fluid bolus was given. Scheduled for colonoscopy and EGD today   REVIEW OF SYSTEMS:  CONSTITUTIONAL: No fever, fatigue or weakness.  EYES: No blurred or double vision.  EARS, NOSE, AND THROAT: No tinnitus or ear pain.  RESPIRATORY: No cough, shortness of breath, wheezing or hemoptysis.  CARDIOVASCULAR: No chest pain, orthopnea, edema.  GASTROINTESTINAL: No nausea, vomiting, diarrhea or abdominal pain.  GENITOURINARY: No dysuria, hematuria.  ENDOCRINE: No polyuria, nocturia,  HEMATOLOGY: No anemia, easy bruising or bleeding SKIN: No rash or lesion. MUSCULOSKELETAL: No joint pain or arthritis.   NEUROLOGIC: No tingling, numbness, weakness.  PSYCHIATRY: No anxiety or depression.   DRUG ALLERGIES:  No Known Allergies  VITALS:  Blood pressure 112/56, pulse 78, temperature 100.5 F (38.1 C), temperature source Oral, resp. rate 18, height 5\' 7"  (1.702 m), weight 90.175 kg (198 lb 12.8 oz), SpO2 99 %.  PHYSICAL EXAMINATION:  GENERAL:  65 y.o.-year-old patient lying in the bed with no acute distress.  EYES: Pupils equal, round, reactive to light and accommodation. No scleral icterus. Extraocular muscles intact.  HEENT: Head atraumatic, normocephalic. Oropharynx and nasopharynx clear.  NECK:  Supple, no jugular venous distention. No thyroid enlargement, no tenderness.  LUNGS: Normal breath sounds bilaterally, no wheezing, rales,rhonchi or crepitation. No use of accessory muscles of respiration.  CARDIOVASCULAR: S1, S2 normal. No murmurs, rubs, or gallops.  ABDOMEN: Soft, nontender, nondistended. Bowel sounds  present. No organomegaly or mass.  EXTREMITIES: No pedal edema, cyanosis, or clubbing.  NEUROLOGIC: Cranial nerves II through XII are intact. Muscle strength 5/5 in all extremities. Sensation intact. Gait not checked.  PSYCHIATRIC: The patient is alert and oriented x 3.  SKIN: No obvious rash, lesion, or ulcer.    LABORATORY PANEL:   CBC  Recent Labs Lab 05/13/15 0953  WBC 7.8  HGB 8.6*  HCT 25.8*  PLT 223   ------------------------------------------------------------------------------------------------------------------  Chemistries   Recent Labs Lab 05/13/15 0513  NA 137  K 4.2  CL 108  CO2 22  GLUCOSE 146*  BUN 25*  CREATININE 1.70*  CALCIUM 8.5*   ------------------------------------------------------------------------------------------------------------------  Cardiac Enzymes No results for input(s): TROPONINI in the last 168 hours. ------------------------------------------------------------------------------------------------------------------  RADIOLOGY:  No results found.  EKG:   Orders placed or performed during the hospital encounter of 05/11/15  . EKG 12-Lead  . EKG 12-Lead  . ED EKG  . ED EKG  . EKG 12-Lead  . EKG 12-Lead  . EKG 12-Lead  . EKG 12-Lead    ASSESSMENT AND PLAN:   This is a 65 year old Caucasian male with past medical history of coronary artery disease, chronic kidney disease and diabetes admitted for bright red bleeding per rectum.  1. Lower GI bleed: Painless with Acute blood loss anemia Transfused 1 unit PRBC on 05/11/2015. Hb is stable 8.4--8.5 Colonoscopy scheduled today  2. Syncope probably vasovagal/orthostatic from GI bleed IV fluid bolus and continue IV fluids Monitor in telemetry Cardiology has cleared the patient for colonoscopy today, appreciate cardiology recommendations-Dr. Clayborn Bigness We will monitor hemoglobin and hematocrit closely and transfuse if hemoglobin is less than or equal to 8  2. Coronary artery  disease: Stable.  I have held aspirin for now.  3. Essential hypertension: Currently patient is hypotensive  Hold  amlodipine, metoprolol and Diovan HCT, lisinopril and spironolactone.   4. Diabetes mellitus type 2: Hold metformin as well as Victoza for now. Sliding scale insulin while hospitalized  5. ARF over CKD3 Provide IVF Check a.m. labs     All the records are reviewed and case discussed with Care Management/Social Workerr. Management plans discussed with the patient, family and they are in agreement.  CODE STATUS: fc  TOTAL TIME TAKING CARE OF THIS PATIENT: 35 minutes.   POSSIBLE D/C IN 2-3 dAYS, DEPENDING ON CLINICAL CONDITION.   Nicholes Mango M.D on 05/13/2015 at 2:59 PM  Between 7am to 6pm - Pager - [phone] After 6pm go to [email or web address] - password [email or web address] [facility]  [place] [facility]  Office  [phone]  CC: Primary care physician; Adrian Prows, MD

## 2015-05-14 ENCOUNTER — Inpatient Hospital Stay: Payer: 59

## 2015-05-14 LAB — GLUCOSE, CAPILLARY
Glucose-Capillary: 172 mg/dL — ABNORMAL HIGH (ref 65–99)
Glucose-Capillary: 187 mg/dL — ABNORMAL HIGH (ref 65–99)
Glucose-Capillary: 200 mg/dL — ABNORMAL HIGH (ref 65–99)
Glucose-Capillary: 256 mg/dL — ABNORMAL HIGH (ref 65–99)
Glucose-Capillary: 264 mg/dL — ABNORMAL HIGH (ref 65–99)
Glucose-Capillary: 319 mg/dL — ABNORMAL HIGH (ref 65–99)

## 2015-05-14 LAB — URINALYSIS COMPLETE WITH MICROSCOPIC (ARMC ONLY)
BACTERIA UA: NONE SEEN
BILIRUBIN URINE: NEGATIVE
Glucose, UA: >500 mg/dL — AB
Ketones, ur: NEGATIVE mg/dL
Leukocytes, UA: NEGATIVE
Nitrite: NEGATIVE
PH: 6 (ref 5.0–8.0)
Protein, ur: 100 mg/dL — AB
SPECIFIC GRAVITY, URINE: 1.007 (ref 1.005–1.030)

## 2015-05-14 LAB — BASIC METABOLIC PANEL
Anion gap: 10 (ref 5–15)
BUN: 21 mg/dL — ABNORMAL HIGH (ref 6–20)
CALCIUM: 7.8 mg/dL — AB (ref 8.9–10.3)
CHLORIDE: 106 mmol/L (ref 101–111)
CO2: 19 mmol/L — AB (ref 22–32)
CREATININE: 2 mg/dL — AB (ref 0.61–1.24)
GFR calc Af Amer: 39 mL/min — ABNORMAL LOW (ref >60)
GFR calc non Af Amer: 34 mL/min — ABNORMAL LOW (ref >60)
GLUCOSE: 214 mg/dL — AB (ref 65–99)
Potassium: 4.2 mmol/L (ref 3.5–5.1)
Sodium: 135 mmol/L (ref 135–145)

## 2015-05-14 LAB — CBC
HEMATOCRIT: 26.8 % — AB (ref 40.0–52.0)
Hemoglobin: 9.2 g/dL — ABNORMAL LOW (ref 13.0–18.0)
MCH: 30.6 pg (ref 26.0–34.0)
MCHC: 34.4 g/dL (ref 32.0–36.0)
MCV: 89.1 fL (ref 80.0–100.0)
Platelets: 153 10*3/uL (ref 150–440)
RBC: 3.01 MIL/uL — ABNORMAL LOW (ref 4.40–5.90)
RDW: 14.2 % (ref 11.5–14.5)
WBC: 8.9 10*3/uL (ref 3.8–10.6)

## 2015-05-14 LAB — TSH: TSH: 0.975 u[IU]/mL (ref 0.350–4.500)

## 2015-05-14 LAB — HEMOGLOBIN AND HEMATOCRIT, BLOOD
HCT: 24.2 % — ABNORMAL LOW (ref 40.0–52.0)
Hemoglobin: 8.3 g/dL — ABNORMAL LOW (ref 13.0–18.0)

## 2015-05-14 MED ORDER — INSULIN ASPART 100 UNIT/ML ~~LOC~~ SOLN
0.0000 [IU] | Freq: Three times a day (TID) | SUBCUTANEOUS | Status: DC
  Administered 2015-05-14 – 2015-05-15 (×2): 3 [IU] via SUBCUTANEOUS
  Administered 2015-05-15: 5 [IU] via SUBCUTANEOUS
  Filled 2015-05-14 (×2): qty 5
  Filled 2015-05-14: qty 3

## 2015-05-14 MED ORDER — INSULIN ASPART 100 UNIT/ML ~~LOC~~ SOLN
3.0000 [IU] | Freq: Three times a day (TID) | SUBCUTANEOUS | Status: DC
Start: 2015-05-14 — End: 2015-05-15
  Administered 2015-05-14 – 2015-05-15 (×3): 3 [IU] via SUBCUTANEOUS
  Filled 2015-05-14 (×3): qty 3

## 2015-05-14 MED ORDER — INSULIN GLARGINE 100 UNIT/ML ~~LOC~~ SOLN
10.0000 [IU] | Freq: Every day | SUBCUTANEOUS | Status: DC
  Administered 2015-05-14: 10 [IU] via SUBCUTANEOUS
  Filled 2015-05-14 (×3): qty 0.1

## 2015-05-14 NOTE — Progress Notes (Signed)
GI Note:  Hgb last night of 7.1 was spurious as evidenced by Hgb this am of 9 after only 1 u prbc.

## 2015-05-14 NOTE — Progress Notes (Signed)
[place] at [place] NAME: Roger Knight    MR#:  [id number]  DATE OF BIRTH:  [birth year]  SUBJECTIVE:  CHIEF COMPLAINT:  Patient is resting comfortably during my examination in am, had temperature of 100.5 and 101.2 last night. Denies any cough chest pain or shortness of breath. Patient had EGD and colonoscopy yesterday, denies any other episodes of bowel movements with blood  REVIEW OF SYSTEMS:  CONSTITUTIONAL: No fever, fatigue or weakness.  EYES: No blurred or double vision.  EARS, NOSE, AND THROAT: No tinnitus or ear pain.  RESPIRATORY: No cough, shortness of breath, wheezing or hemoptysis.  CARDIOVASCULAR: No chest pain, orthopnea, edema.  GASTROINTESTINAL: No nausea, vomiting, diarrhea or abdominal pain.  GENITOURINARY: No dysuria, hematuria.  ENDOCRINE: No polyuria, nocturia,  HEMATOLOGY: No anemia, easy bruising or bleeding SKIN: No rash or lesion. MUSCULOSKELETAL: No joint pain or arthritis.   NEUROLOGIC: No tingling, numbness, weakness.  PSYCHIATRY: No anxiety or depression.   DRUG ALLERGIES:  No Known Allergies  VITALS:  Blood pressure 144/62, pulse 72, temperature 98.1 F (36.7 C), temperature source Oral, resp. rate 17, height 5\' 7"  (1.702 m), weight 91.989 kg (202 lb 12.8 oz), SpO2 98 %.  PHYSICAL EXAMINATION:  GENERAL:  65 y.o.-year-old patient lying in the bed with no acute distress.  EYES: Pupils equal, round, reactive to light and accommodation. No scleral icterus. Extraocular muscles intact.  HEENT: Head atraumatic, normocephalic. Oropharynx and nasopharynx clear.  NECK:  Supple, no jugular venous distention. No thyroid enlargement, no tenderness.  LUNGS: Normal breath sounds bilaterally, no wheezing, rales,rhonchi or crepitation. No use of accessory muscles of respiration.  CARDIOVASCULAR: S1, S2 normal. No murmurs, rubs, or gallops.  ABDOMEN: Soft, nontender, nondistended. Bowel sounds present. No  organomegaly or mass.  EXTREMITIES: No pedal edema, cyanosis, or clubbing.  NEUROLOGIC: Cranial nerves II through XII are intact. Muscle strength 5/5 in all extremities. Sensation intact. Gait not checked.  PSYCHIATRIC: The patient is alert and oriented x 3.  SKIN: No obvious rash, lesion, or ulcer.    LABORATORY PANEL:   CBC  Recent Labs Lab 05/14/15 0505  WBC 8.9  HGB 9.2*  HCT 26.8*  PLT 153   ------------------------------------------------------------------------------------------------------------------  Chemistries   Recent Labs Lab 05/14/15 0505  NA 135  K 4.2  CL 106  CO2 19*  GLUCOSE 214*  BUN 21*  CREATININE 2.00*  CALCIUM 7.8*   ------------------------------------------------------------------------------------------------------------------  Cardiac Enzymes No results for input(s): TROPONINI in the last 168 hours. ------------------------------------------------------------------------------------------------------------------  RADIOLOGY:  Dg Chest 2 View  05/14/2015  CLINICAL DATA:  Admitted to hosp on Tuesday for blood loss. 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 Shielded. EXAM: CHEST - 2 VIEW COMPARISON:  03/25/2013 FINDINGS: Previous CABG. Chronic coarse perihilar interstitial markings. No confluent airspace disease or overt edema. Heart size normal. Atheromatous aorta. No effusion.  No pneumothorax. Spurring in the lower thoracic spine IMPRESSION: 1. Stable chronic and postop changes.  No acute disease. Electronically Signed   By: Lucrezia Europe M.D.   On: 05/14/2015 09:47    EKG:   Orders placed or performed during the hospital encounter of 05/11/15  . EKG 12-Lead  . EKG 12-Lead  . ED EKG  . ED EKG  . EKG 12-Lead  . EKG 12-Lead  . EKG 12-Lead  . EKG 12-Lead    ASSESSMENT AND PLAN:   This is a 65 year old Caucasian male with past medical history of coronary  artery disease, chronic kidney disease and diabetes  admitted for bright red bleeding per rectum.  1. Lower GI bleed: Painless with Acute blood loss anemia Transfused 1 unit PRBC on 05/11/2015. Hb is stable 8.4--8.5--9.2, monitor hemoglobin and hematocrit and transfuse as needed  Colonoscopy done on 05/13/2015 has revealed external hemorrhoids need surgical evaluation EGD has revealed 7 mm prepyloric polyps which need to be removed eventually the next few months outpatient follow-up with gastroenterology as recommended  2. Syncope probably vasovagal/orthostatic from GI bleed Improved with IV fluid bolus and  IV fluids Monitor in telemetry Cardiology has cleared the patient for colonoscopy yesterday,  appreciate cardiology recommendations-Dr. Clayborn Bigness We will monitor hemoglobin and hematocrit closely and transfuse if hemoglobin is less than or equal to 8  #Fever-patient had temperature 100.5 and 101.2 prior to the blood transfusion yesterday No other episodes of fever so far UA is negative chest x-ray negative Will get blood cultures 2 if patient spikes temperature again Not considering antibiotics at this time Will discharge patient in a.m. if it he's afebrile for 24 hours  # Coronary artery disease: Stable. I have held aspirin for now.  #. Essential hypertension:  Resume metoprolol and amlodipine Holding  Diovan HCT, lisinopril and spironolactone.   #. Diabetes mellitus type 2: Hold metformin as well as Victoza for now.  Lantus 10 units subcutaneous daily at bedtime Sliding scale insulin while hospitalized  #. ARF over CKD3 Provide IVF Check a.m. Labs creatinine 1.7--2.0-     All the records are reviewed and case discussed with Care Management/Social Workerr. Management plans discussed with the patient, family and they are in agreement.  CODE STATUS: fc  TOTAL TIME TAKING CARE OF THIS PATIENT: 35 minutes.   POSSIBLE D/C IN am dAYS, DEPENDING ON CLINICAL CONDITION.   Nicholes Mango M.D on 05/14/2015 at 3:27 PM  Between  7am to 6pm - Pager - [phone] After 6pm go to [email or web address] - password [email or web address] [facility]  [place] [facility]  Office  [phone]  CC: Primary care physician; Adrian Prows, MD

## 2015-05-14 NOTE — Progress Notes (Signed)
Received call from Diabetes Education Coordinator requesting for Q4 Novolog to be d/c and for pt to have new orders placed for TID HS Novolog.   Contacted Dr. Margaretmary Eddy and advised her of this request.  Dr. Margaretmary Eddy stated that she will change it.  Will continue to monitor.

## 2015-05-14 NOTE — Progress Notes (Signed)
Inpatient Diabetes Program Recommendations  AACE/ADA: New Consensus Statement on Inpatient Glycemic Control (2015)  Target Ranges:  Prepandial:   less than 140 mg/dL      Peak postprandial:   less than 180 mg/dL (1-2 hours)      Critically ill patients:  140 - 180 mg/dL   Review of Glycemic Control  Diabetes history: Type 2 Outpatient Diabetes medications: Lantus 65 units qhs, Metformin 1000mg  bid, Victoza 1.2mg /day, ordered Novolog 10 units bid (breakfast and supper)- he increased it to 15 units bid  Current orders for Inpatient glycemic control: Novolog 0-9 units q4h, Lantus 10 units qhs starting tonight  Inpatient Diabetes Program Recommendations: Employee of [employer] - seen regularly by endocrinologist (see recent note 04/16/15) Has had a few episodes of hypoglycemia unawareness. MD stopped Glipizide and decreased Novolog to 10 units bid in January 2017- Roger Knight increased it back up to 15 units bid.  Inpatient Diabetes Program Recommendations:  Now that Roger Knight is eating a regular diet, please consider changing him to Novolog 0-9 tid with meals and add Novolog 3 units tid with meals (hold if he eats less than 50%)  Roger Fitz, RN, BA, [place], CDE Diabetes Coordinator Inpatient Diabetes Program  [phone] (Team Pager) [phone] ([place]) 05/14/2015 2:58 PM

## 2015-05-14 NOTE — Progress Notes (Signed)
Patient (pt) refuses bed alarm.  Pt states that he feels steady on his feet.  Pt was educated on the importance of keeping the bed alarm on for additional safety however pt continues to refuse.  Dr. Margaretmary Eddy aware and okay with bed alarm being off.  Wife at bedside and states she will assist patient as needed.  I will continue hourly rounding for safety checks and batthroom needs. I have reiterized to pt how to call for the nurse and nurse assistant for help, as well as to ring the call bell for assistance.  I have also educated pt on how to be aware of feeling dizzy during activity and to move slowly.  Will continue to monitor.

## 2015-05-15 LAB — GLUCOSE, CAPILLARY
Glucose-Capillary: 215 mg/dL — ABNORMAL HIGH (ref 65–99)
Glucose-Capillary: 215 mg/dL — ABNORMAL HIGH (ref 65–99)
Glucose-Capillary: 229 mg/dL — ABNORMAL HIGH (ref 65–99)
Glucose-Capillary: 264 mg/dL — ABNORMAL HIGH (ref 65–99)

## 2015-05-15 LAB — CBC
HCT: 28.4 % — ABNORMAL LOW (ref 40.0–52.0)
Hemoglobin: 10 g/dL — ABNORMAL LOW (ref 13.0–18.0)
MCH: 31.3 pg (ref 26.0–34.0)
MCHC: 35.1 g/dL (ref 32.0–36.0)
MCV: 89.1 fL (ref 80.0–100.0)
PLATELETS: 168 10*3/uL (ref 150–440)
RBC: 3.19 MIL/uL — AB (ref 4.40–5.90)
RDW: 14.4 % (ref 11.5–14.5)
WBC: 5.8 10*3/uL (ref 3.8–10.6)

## 2015-05-15 NOTE — Discharge Summary (Signed)
[place] at [place] NAME: Roger Knight    MR#:  [id number]  DATE OF BIRTH:  December 02, 1950  DATE OF ADMISSION:  05/11/2015 ADMITTING PHYSICIAN: Harrie Foreman, MD  DATE OF DISCHARGE :05/15/2015  PRIMARY CARE PHYSICIAN: Adrian Prows, MD    ADMISSION DIAGNOSIS:  Acute blood loss anemia [D62] Gastrointestinal hemorrhage associated with anorectal source [K62.5]  DISCHARGE DIAGNOSIS:  Active Problems:   GIB (gastrointestinal bleeding)   SECONDARY DIAGNOSIS:   Past Medical History  Diagnosis Date  . Arthritis   . GERD (gastroesophageal reflux disease)   . Diabetes mellitus without complication ([place])   . Hernia   . Anemia   . Hyperlipidemia   . Hypertension   . Carcinoma ([place]) 2012     skin cancer on neck [facility]  . Chronic kidney disease 2016  . Skin cancer   . Accelerating angina ([place]) 02/11/2014    HOSPITAL COURSE:   This is a 65 year old Caucasian male with past medical history of coronary artery disease, chronic kidney disease and diabetes admitted for bright red bleeding per rectum.  1. Lower GI bleed: Painless with Acute blood loss anemia Transfused 1 unit PRBC on 05/11/2015. Hb is stable 8.4--8.5--9.2, monitor hemoglobin and hematocrit and transfuse as needed  Colonoscopy done on 05/13/2015 has revealed external hemorrhoids need surgical evaluation   t scheduled appointment with Dr. Bary Castilla in next week. EGD has revealed 7 mm prepyloric polyps which need to be removed eventually the next few months outpatient follow-up with gastroenterology as recommended  2. Syncope probably vasovagal/orthostatic from GI bleed Improved with IV fluid bolus and IV fluids Monitor in telemetry Cardiology has cleared the patient for colonoscopy yesterday,  appreciate cardiology recommendations-Dr. Clayborn Bigness Hb is stable.  #Fever-patient had temperature 100.5 and 101.2 prior to the blood transfusion 05/13/15 No other  episodes of fever so far UA is negative chest x-ray negative Will get blood cultures 2 if patient spikes temperature again- no more fever in last 36 hrs. Not considering antibiotics at this time D/c home, no need for Abx.  # Coronary artery disease: Stable. I have held aspirin for now.  resume after following with surgical clinic.  #. Essential hypertension:  Resume metoprolol and amlodipine Holding Diovan HCT, lisinopril and spironolactone.   #. Diabetes mellitus type 2: Hold metformin as well as Victoza in hospital. Lantus 10 units subcutaneous daily at bedtime Sliding scale insulin while hospitalized Resume home meds on d/c.  #. ARF over CKD3 Provide IVF Check a.m. Labs creatinine 1.7--2.0-    DISCHARGE CONDITIONS:   Stable.  CONSULTS OBTAINED:  Treatment Team:  Josefine Class, MD Yolonda Kida, MD  DRUG ALLERGIES:  No Known Allergies  DISCHARGE MEDICATIONS:   Current Discharge Medication List    CONTINUE these medications which have NOT CHANGED   Details  amLODipine (NORVASC) 5 MG tablet Take 10 mg by mouth daily.     atorvastatin (LIPITOR) 80 MG tablet Take 80 mg by mouth daily.    cetirizine (ZYRTEC) 10 MG tablet Take 10 mg by mouth daily.    ezetimibe (ZETIA) 10 MG tablet Take 10 mg by mouth daily.    ferrous fumarate (HEMOCYTE - 106 MG FE) 325 (106 FE) MG TABS tablet Take 1 tablet by mouth 2 (two) times daily.     FIBER FORMULA PO Take 1 tablet by mouth 2 (two) times daily.     insulin aspart (NOVOLOG) 100 UNIT/ML injection Inject 15 Units into the skin 2 (  two) times daily.     Liraglutide (VICTOZA [place]) Inject 1.2 Units into the skin daily at 6 PM.     metFORMIN (GLUCOPHAGE) 500 MG tablet Take 1,000 mg by mouth 2 (two) times daily with a meal.     metoprolol (TOPROL-XL) 200 MG 24 hr tablet Take 200 mg by mouth daily.    Multiple Vitamins-Minerals (MULTIVITAMIN WITH MINERALS) tablet Take 1 tablet by mouth daily.    pantoprazole  (PROTONIX) 20 MG tablet Take 40 mg by mouth daily.     pregabalin (LYRICA) 75 MG capsule Take 75 mg by mouth 2 (two) times daily. Usually takes 1 at dinner time only    sildenafil (VIAGRA) 100 MG tablet Take 100 mg by mouth as needed for erectile dysfunction.    spironolactone (ALDACTONE) 25 MG tablet Take 25 mg by mouth 2 (two) times daily.    triamcinolone cream (KENALOG) 0.1 % Apply 1 application topically 2 (two) times daily as needed (rash).     valsartan-hydrochlorothiazide (DIOVAN-HCT) 320-25 MG per tablet Take 1 tablet by mouth daily.      STOP taking these medications     aspirin EC 81 MG tablet          DISCHARGE INSTRUCTIONS:   Follow with Surgical clinic in 1-2 weeks.  If you experience worsening of your admission symptoms, develop shortness of breath, life threatening emergency, suicidal or homicidal thoughts you must seek medical attention immediately by calling 911 or calling your MD immediately  if symptoms less severe.  You Must read complete instructions/literature along with all the possible adverse reactions/side effects for all the Medicines you take and that have been prescribed to you. Take any new Medicines after you have completely understood and accept all the possible adverse reactions/side effects.   Please note  You were cared for by a hospitalist during your hospital stay. If you have any questions about your discharge medications or the care you received while you were in the hospital after you are discharged, you can call the unit and asked to speak with the hospitalist on call if the hospitalist that took care of you is not available. Once you are discharged, your primary care physician will handle any further medical issues. Please note that NO REFILLS for any discharge medications will be authorized once you are discharged, as it is imperative that you return to your primary care physician (or establish a relationship with a primary care physician if  you do not have one) for your aftercare needs so that they can reassess your need for medications and monitor your lab values.    Today   CHIEF COMPLAINT:   Chief Complaint  Patient presents with  . Rectal Bleeding    HISTORY OF PRESENT ILLNESS:  Roger Knight  is a 65 y.o. male presents to the emergency department complaining of a large volume of bright red blood per rectum. The patient states the bleeding was painless. He has felt well. He denies fever, chest pain, shortness of breath, nausea, vomiting or diarrhea. He states this has happened before when he had been on Effient. However, at this time he is on no blood thinners. In the emergency department vital signs were found to be stable but his hemoglobin dropped from 12 g to 8 g which prompted the emergency department staff to obtain a type and screen for blood transfusion prior to calling the hospitalist service for admission.    VITAL SIGNS:  Blood pressure 126/62, pulse 60, temperature 98.2 F (  36.8 C), temperature source Oral, resp. rate 16, height 5\' 7"  (1.702 m), weight 90.039 kg (198 lb 8 oz), SpO2 99 %.  I/O:   Intake/Output Summary (Last 24 hours) at 05/15/15 1509 Last data filed at 05/15/15 1238  Gross per 24 hour  Intake   1503 ml  Output   2325 ml  Net   -822 ml    PHYSICAL EXAMINATION:   GENERAL: 65 y.o.-year-old patient lying in the bed with no acute distress.  EYES: Pupils equal, round, reactive to light and accommodation. No scleral icterus. Extraocular muscles intact.  HEENT: Head atraumatic, normocephalic. Oropharynx and nasopharynx clear.  NECK: Supple, no jugular venous distention. No thyroid enlargement, no tenderness.  LUNGS: Normal breath sounds bilaterally, no wheezing, rales,rhonchi or crepitation. No use of accessory muscles of respiration.  CARDIOVASCULAR: S1, S2 normal. No murmurs, rubs, or gallops.  ABDOMEN: Soft, nontender, nondistended. Bowel sounds present. No organomegaly or  mass.  EXTREMITIES: No pedal edema, cyanosis, or clubbing.  NEUROLOGIC: Cranial nerves II through XII are intact. Muscle strength 5/5 in all extremities. Sensation intact. Gait not checked.  PSYCHIATRIC: The patient is alert and oriented x 3.  SKIN: No obvious rash, lesion, or ulcer.   DATA REVIEW:   CBC  Recent Labs Lab 05/15/15 0559  WBC 5.8  HGB 10.0*  HCT 28.4*  PLT 168    Chemistries   Recent Labs Lab 05/14/15 0505  NA 135  K 4.2  CL 106  CO2 19*  GLUCOSE 214*  BUN 21*  CREATININE 2.00*  CALCIUM 7.8*    Cardiac Enzymes No results for input(s): TROPONINI in the last 168 hours.  Microbiology Results  Results for orders placed or performed in visit on 03/25/13  Culture, blood (single)     Status: None   Collection Time: 03/25/13  2:24 PM  Result Value Ref Range Status   Micro Text Report   Final       COMMENT                   NO GROWTH AEROBICALLY/ANAEROBICALLY IN 5 DAYS   ANTIBIOTIC                                                      Culture, blood (single)     Status: None   Collection Time: 03/25/13  2:27 PM  Result Value Ref Range Status   Micro Text Report   Final       COMMENT                   NO GROWTH AEROBICALLY/ANAEROBICALLY IN 5 DAYS   ANTIBIOTIC                                                        RADIOLOGY:  Dg Chest 2 View  05/14/2015  CLINICAL DATA:  Admitted to hosp on Tuesday for blood loss. 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 Shielded. EXAM: CHEST - 2 VIEW COMPARISON:  03/25/2013 FINDINGS: Previous CABG. Chronic coarse perihilar interstitial markings. No confluent airspace disease or overt edema. Heart size normal. Atheromatous aorta. No effusion.  No pneumothorax. Spurring in the lower thoracic spine IMPRESSION: 1. Stable chronic and postop changes.  No acute disease. Electronically Signed   By: Lucrezia Europe M.D.   On: 05/14/2015 09:47      Management plans discussed with the patient,  family and they are in agreement.  CODE STATUS:     Code Status Orders        Start     Ordered   05/11/15 0830  Full code   Continuous     05/11/15 0829    Code Status History    Date Active Date Inactive Code Status Order ID Comments User Context   This patient has a current code status but no historical code status.      TOTAL TIME TAKING CARE OF THIS PATIENT: 35 minutes.    Vaughan Basta M.D on 05/15/2015 at 3:09 PM  Between 7am to 6pm - Pager - [phone]  After 6pm go to [email or web address] - password [email or web address] [facility]  [place] [facility]  Office  [phone]  CC: Primary care physician; Adrian Prows, MD   Note: This dictation was prepared with Dragon dictation along with smaller phrase technology. Any transcriptional errors that result from this process are unintentional.

## 2015-05-15 NOTE — Progress Notes (Signed)
Educated patient about needing the bed alarm since he fell on 1/19.  He insisted that the fall come from losing blood and the colonoscopy prep, he did not feel dizzy or weak at all.  He absolutely refused the bed alarm, said he would call if he did feel dizzy.

## 2015-05-15 NOTE — Progress Notes (Signed)
MD ordered patient to be discharged home.  Discharge instructions were reviewed with the patient and wife and they voiced understanding.  No prescriptions given.  Patient instructed about making his follow-up appointment.  IVs were removed with catheters intact.  All questions were answered.  Patient left via wheelchair escorted by nursing.

## 2015-05-16 LAB — TYPE AND SCREEN
ABO/RH(D): O POS
ANTIBODY SCREEN: NEGATIVE
UNIT DIVISION: 0
Unit division: 0
Unit division: 0
Unit division: 0

## 2015-05-17 ENCOUNTER — Encounter: Payer: Self-pay | Admitting: Gastroenterology

## 2015-05-17 ENCOUNTER — Ambulatory Visit (INDEPENDENT_AMBULATORY_CARE_PROVIDER_SITE_OTHER): Payer: 59 | Admitting: General Surgery

## 2015-05-17 VITALS — BP 118/68 | HR 70 | Resp 12 | Ht 67.0 in | Wt 199.0 lb

## 2015-05-17 DIAGNOSIS — K648 Other hemorrhoids: Secondary | ICD-10-CM

## 2015-05-17 DIAGNOSIS — K644 Residual hemorrhoidal skin tags: Secondary | ICD-10-CM

## 2015-05-17 LAB — POC HEMOCCULT BLD/STL (OFFICE/1-CARD/DIAGNOSTIC): Fecal Occult Blood, POC: NEGATIVE

## 2015-05-17 MED ORDER — HYDROCORTISONE ACETATE 25 MG RE SUPP: 25.0000 mg | Freq: Two times a day (BID) | RECTAL | Status: DC

## 2015-05-17 NOTE — Progress Notes (Signed)
Patient ID: Roger Knight, male   DOB: 08-26-1950, 65 y.o.   MRN: [id number]  Chief Complaint  Patient presents with  . Other    bleeding hemorrhoids    HPI Roger Knight is a 65 y.o. male here today for a evaluation of bleeding hemorrhoids. Patient states this started about two years ago. He has noticed some blood in the bowel and on the paper, also in his stool. Patient had an upper and lower endoscopy done 05/13/15. Patient was seen in the ER on 1/05/01/15 passing out in his restroom . Moves his bowls every three days.   The patient had an episode of syncope while in the hospital, likely vasovagal in nature based on a 3 second pause reported on telemetry strips and cardiology assessment.  I personally reviewed the patient's history. HPI  Past Medical History  Diagnosis Date  . Arthritis   . GERD (gastroesophageal reflux disease)   . Diabetes mellitus without complication ([place])   . Hernia   . Anemia   . Hyperlipidemia   . Hypertension   . Carcinoma ([place]) 2012     skin cancer on neck [facility]  . Chronic kidney disease 2016  . Skin cancer   . Accelerating angina ([place]) 02/11/2014    Past Surgical History  Procedure Laterality Date  . Coronary artery bypass graft  1996    [facility]  . Coronary stent placement  2011    [facility]  . Colonoscopy  April 2014  . Upper gi endoscopy  2014  . Colonoscopy N/A 05/13/2015    Procedure: COLONOSCOPY;  Surgeon: Josefine Class, MD;  Location: [facility] ENDOSCOPY;  Service: Endoscopy;  Laterality: N/A;  . Esophagogastroduodenoscopy (egd) with propofol N/A 05/13/2015    Procedure: ESOPHAGOGASTRODUODENOSCOPY (EGD) WITH PROPOFOL;  Surgeon: Josefine Class, MD;  Location: [facility] ENDOSCOPY;  Service: Endoscopy;  Laterality: N/A;    Family History  Problem Relation Age of Onset  . Dementia Mother 36  . Osteoarthritis Mother   . Cancer Other     skin cancer    Social History Social History  Substance Use Topics  . Smoking status: Former  Smoker    Types: Cigarettes    Quit date: 04/24/2005  . Smokeless tobacco: Former Systems developer    Types: Chew  . Alcohol Use: No    No Known Allergies  Current Outpatient Prescriptions  Medication Sig Dispense Refill  . amLODipine (NORVASC) 5 MG tablet Take 10 mg by mouth daily.     Marland Kitchen atorvastatin (LIPITOR) 80 MG tablet Take 80 mg by mouth daily.    . cetirizine (ZYRTEC) 10 MG tablet Take 10 mg by mouth daily.    Marland Kitchen ezetimibe (ZETIA) 10 MG tablet Take 10 mg by mouth daily.    . ferrous fumarate (HEMOCYTE - 106 MG FE) 325 (106 FE) MG TABS tablet Take 1 tablet by mouth 2 (two) times daily.     Marland Kitchen FIBER FORMULA PO Take 1 tablet by mouth 2 (two) times daily.     . insulin aspart (NOVOLOG) 100 UNIT/ML injection Inject 15 Units into the skin 2 (two) times daily.     . Liraglutide (VICTOZA [place]) Inject 1.2 Units into the skin daily at 6 PM.     . metFORMIN (GLUCOPHAGE) 500 MG tablet Take 1,000 mg by mouth 2 (two) times daily with a meal.     . metoprolol (TOPROL-XL) 200 MG 24 hr tablet Take 200 mg by mouth daily.    . Multiple Vitamins-Minerals (MULTIVITAMIN WITH MINERALS)  tablet Take 1 tablet by mouth daily.    . pantoprazole (PROTONIX) 20 MG tablet Take 40 mg by mouth daily.     . pregabalin (LYRICA) 75 MG capsule Take 75 mg by mouth 2 (two) times daily. Usually takes 1 at dinner time only    . sildenafil (VIAGRA) 100 MG tablet Take 100 mg by mouth as needed for erectile dysfunction.    Marland Kitchen spironolactone (ALDACTONE) 25 MG tablet Take 25 mg by mouth 2 (two) times daily.    Marland Kitchen triamcinolone cream (KENALOG) 0.1 % Apply 1 application topically 2 (two) times daily as needed (rash).     . valsartan-hydrochlorothiazide (DIOVAN-HCT) 320-25 MG per tablet Take 1 tablet by mouth daily.    . hydrocortisone (ANUSOL-HC) 25 MG suppository Place 1 suppository (25 mg total) rectally 2 (two) times daily. 24 suppository 0   No current facility-administered medications for this visit.    Review of Systems Review of  Systems  Constitutional: Negative.   Respiratory: Negative.   Cardiovascular: Negative.     Blood pressure 118/68, pulse 70, resp. rate 12, height 5\' 7"  (1.702 m), weight 199 lb (90.266 kg).  Physical Exam Physical Exam  Constitutional: He is oriented to person, place, and time. He appears well-developed and well-nourished.  Cardiovascular: Normal rate, regular rhythm and normal heart sounds.   Pulmonary/Chest: Effort normal and breath sounds normal.  Abdominal: Soft. Normal appearance and bowel sounds are normal. There is no tenderness.  Genitourinary: Rectal exam shows external hemorrhoid ( 7 o'clock) and internal hemorrhoid.  Neurological: He is alert and oriented to person, place, and time.  Skin: Skin is warm and dry.    Data Reviewed 03/25/2015 hospitalization for hypoglycemia records reviewed. 05/11/2015 hospitalization as well as upper and lower endoscopy report reviewed when the patient presented with rectal bleeding. He was transfused during that hospitalization. Endoscopy was negative for a source of bleeding. Hemorrhoids were identified.  04/15/2013 office notes reviewed. Hemorrhoids identified without active bleeding.  Assessment    Lower GI bleeding with anemia requiring transfusion, likely secondary to hemorrhoids.    Plan    The patient describes sitting extended periods of time on the commode dating back to childhood. He reports he would take a volume from the encyclopedia read while he was in the restroom. This has continued as an adult. He may sit for 20-30 minutes before a bowel movement. This is likely contributed to venous hypertension in the rectal area and resulting bleeding, probably an internal hemorrhoid as it was painless.  Options for management at this time include 1) elective hemorrhoidectomy to minimize the chance for large volume bleeding, but not totally eliminating the potential for hemorrhoids in the future versus 2) careful stool regimen where he  avoid sitting for more than 5 minutes on the commode, makes use of Anusol HC suppositories to minimize the prominent internal hemorrhoids noted this time and immediate report of further bleeding. We went back and forth about the pros and cons of each option, and he will be discussing with his wife what course will be less likely to produce this severe physical and emotional stress of this recent episode of bleeding requiring transfusion.      This patient wishes to think about whether he would like to proceed with surgery. He will call the office back if he would like to proceed.   PCP:  Adrian Prows This information has been scribed by Verlene Mayer, CMA    Robert Bellow 05/18/2015, 6:16 PM

## 2015-05-17 NOTE — Patient Instructions (Addendum)
Surgical Procedures for Hemorrhoids Surgical procedures can be used to treat hemorrhoids. Hemorrhoids are swollen veins that are inside the rectum (internal hemorrhoids) or around the anus (external hemorrhoids). They are caused by increased pressure in the anal area. This pressure may result from straining to have a bowel movement (constipation), diarrhea, pregnancy, obesity, anal sex, or sitting for long periods of time. Hemorrhoids can cause symptoms such as pain and bleeding. Surgery may be needed if diet changes, lifestyle changes, and other treatments do not help your symptoms. Various surgical methods may be used. Three common methods are:  Closed hemorrhoidectomy. The hemorrhoids are surgically removed, and the surgical cuts (incisions) are closed with stitches (sutures).  Open hemorrhoidectomy. The hemorrhoids are surgically removed, but the incisions are allowed to heal without sutures.  Stapled hemorrhoidopexy. The hemorrhoids are removed using a device that takes out a ring of excess tissue. LET [facility] CARE PROVIDER KNOW ABOUT:  Any allergies you have.  All medicines you are taking, including vitamins, herbs, eye drops, creams, and over-the-counter medicines.  Previous problems you or members of your family have had with the use of anesthetics.  Any blood disorders you have.  Previous surgeries you have had.  Any medical conditions you have.  Whether you are pregnant or may be pregnant. RISKS AND COMPLICATIONS Generally, this is a safe procedure. However, problems may occur, including:  Infection.  Bleeding.  Allergic reactions to medicines.  Damage to other structures or organs.  Pain.  Constipation.  Difficulty passing urine.  Narrowing of the anal canal (stenosis).  Difficulty controlling bowel movements (incontinence). BEFORE THE PROCEDURE  Ask your health care provider about:  Changing or stopping your regular medicines. This is especially  important if you are taking diabetes medicines or blood thinners.  Taking medicines such as aspirin and ibuprofen. These medicines can thin your blood. Do not take these medicines before your procedure if your health care provider instructs you not to.  You may need to have a procedure to examine the inside of your colon with a scope (colonoscopy). Your health care provider may do this to make sure that there are no other causes for your bleeding or pain.  Follow instructions from your health care provider about eating or drinking restrictions.  You may be instructed to take a laxative and an enema to clean out your colon before surgery (bowel prep). Carefully follow instructions from your health care provider about bowel prep.  Ask your health care provider how your surgical site will be marked or identified.  You may be given antibiotic medicine to help prevent infection.  Plan to have someone take you home after the procedure. PROCEDURE  To reduce your risk of infection:  Your health care team will wash or sanitize their hands.  Your skin will be washed with soap.  An IV tube will be inserted into one of your veins.  You will be given one or more of the following:  A medicine to help you relax (sedative).  A medicine to numb the area (local anesthetic).  A medicine to make you fall asleep (general anesthetic).  A medicine that is injected into an area of your body to numb everything below the injection site (regional anesthetic).  A lubricating jelly may be placed into your rectum.  Your surgeon will insert a short scope (anoscope) into your rectum to examine the hemorrhoids.  One of the following hemorrhoid procedures will be performed. Closed Hemorrhoidectomy  Your surgeon will use surgical instruments  to open the tissue around the hemorrhoids.  The veins that supply the hemorrhoids will be tied off with a suture.  The hemorrhoids will be removed.  The tissue that  surrounds the hemorrhoids will be closed with sutures that your body can absorb (absorbable sutures). Open Hemorrhoidectomy  The hemorrhoids will be removed with surgical instruments.  The incisions will be left open to heal without sutures. Stapled Hemorrhoidopexy  Your surgeon will use a circular stapling device to remove the hemorrhoids.  The device will be inserted into your anus. It will remove a circular ring of tissue that includes hemorrhoid tissue and some tissue above the hemorrhoids.  The staples in the device will close the edges of removed tissue. This will cut off the blood supply to the hemorrhoids and will pull any remaining hemorrhoids back into place. Each of these procedures may vary among health care providers and hospitals. AFTER THE PROCEDURE  Your blood pressure, heart rate, breathing rate, and blood oxygen level will be monitored often until the medicines you were given have worn off.  You will be given pain medicine as needed.   This information is not intended to replace advice given to you by your health care provider. Make sure you discuss any questions you have with your health care provider.   Document Released: 02/05/2009 Document Revised: 12/30/2014 Document Reviewed: 07/06/2014 Elsevier Interactive Patient Education [employer].

## 2015-05-18 ENCOUNTER — Encounter: Payer: Self-pay | Admitting: *Deleted

## 2015-05-18 DIAGNOSIS — K644 Residual hemorrhoidal skin tags: Secondary | ICD-10-CM | POA: Insufficient documentation

## 2015-05-20 DIAGNOSIS — R809 Proteinuria, unspecified: Secondary | ICD-10-CM | POA: Diagnosis not present

## 2015-05-20 DIAGNOSIS — D631 Anemia in chronic kidney disease: Secondary | ICD-10-CM | POA: Diagnosis not present

## 2015-05-20 DIAGNOSIS — I10 Essential (primary) hypertension: Secondary | ICD-10-CM | POA: Diagnosis not present

## 2015-05-20 DIAGNOSIS — N183 Chronic kidney disease, stage 3 (moderate): Secondary | ICD-10-CM | POA: Diagnosis not present

## 2015-05-20 DIAGNOSIS — E1122 Type 2 diabetes mellitus with diabetic chronic kidney disease: Secondary | ICD-10-CM | POA: Diagnosis not present

## 2015-05-20 DIAGNOSIS — N2581 Secondary hyperparathyroidism of renal origin: Secondary | ICD-10-CM | POA: Diagnosis not present

## 2015-05-25 ENCOUNTER — Other Ambulatory Visit: Payer: Self-pay

## 2015-05-25 DIAGNOSIS — Z794 Long term (current) use of insulin: Principal | ICD-10-CM

## 2015-05-25 DIAGNOSIS — E118 Type 2 diabetes mellitus with unspecified complications: Secondary | ICD-10-CM

## 2015-05-25 NOTE — Patient Outreach (Signed)
[facility]) Care Management  05/25/2015  GOEFFREY BERCHTOLD Mar 05, 1951 [id number]  Spoke with Roger Knight regarding recent admission to the hospital- he was admitted from January 17th to January 21st. He had rectal bleeding and a syncopal episode (after having a BM).  He attempted to come back to work on January 26th but was only able to stay until 11:30 because he was feeling dizzy.  He saw Dr. Holley Raring that day and has been told to stay away from work until February 1st.  Roger Knight tells me he is off Glipizide but continues to take all his other diabetes medications.  He indicates that his kidney function is worse, his Hgb is 9.4 and his Vitamin D is low. Blood sugars have been in the 160's both am and pm.   He will try to go back to work tomorrow.   [name], RN, BA, [place], [place] Direct Dial:  [phone]  Fax:  [phone] E-mail: [email or web address] [address] [place], [place]  [address]

## 2015-06-01 ENCOUNTER — Telehealth: Payer: Self-pay | Admitting: *Deleted

## 2015-06-01 DIAGNOSIS — D500 Iron deficiency anemia secondary to blood loss (chronic): Secondary | ICD-10-CM | POA: Diagnosis not present

## 2015-06-01 DIAGNOSIS — K922 Gastrointestinal hemorrhage, unspecified: Secondary | ICD-10-CM | POA: Insufficient documentation

## 2015-06-01 DIAGNOSIS — R509 Fever, unspecified: Secondary | ICD-10-CM | POA: Diagnosis not present

## 2015-06-01 DIAGNOSIS — E1149 Type 2 diabetes mellitus with other diabetic neurological complication: Secondary | ICD-10-CM | POA: Diagnosis not present

## 2015-06-01 DIAGNOSIS — N183 Chronic kidney disease, stage 3 (moderate): Secondary | ICD-10-CM | POA: Diagnosis not present

## 2015-06-01 NOTE — Telephone Encounter (Signed)
No additional fmla papers are needed.

## 2015-06-09 DIAGNOSIS — Z794 Long term (current) use of insulin: Secondary | ICD-10-CM | POA: Diagnosis not present

## 2015-06-09 DIAGNOSIS — E1122 Type 2 diabetes mellitus with diabetic chronic kidney disease: Secondary | ICD-10-CM | POA: Diagnosis not present

## 2015-06-09 DIAGNOSIS — N183 Chronic kidney disease, stage 3 (moderate): Secondary | ICD-10-CM | POA: Diagnosis not present

## 2015-06-09 DIAGNOSIS — E1142 Type 2 diabetes mellitus with diabetic polyneuropathy: Secondary | ICD-10-CM | POA: Diagnosis not present

## 2015-06-14 ENCOUNTER — Other Ambulatory Visit: Payer: Self-pay

## 2015-06-14 VITALS — BP 128/76 | HR 76 | Ht 67.0 in | Wt 200.7 lb

## 2015-06-14 DIAGNOSIS — Z794 Long term (current) use of insulin: Principal | ICD-10-CM

## 2015-06-14 DIAGNOSIS — E118 Type 2 diabetes mellitus with unspecified complications: Secondary | ICD-10-CM

## 2015-06-14 NOTE — Patient Outreach (Signed)
[facility]) Care Management  06/14/2015  MD. HOOS 1951/02/27 [id number]   Left a message for Roger Knight to update me re: his current health status and if he will attend our appointment later this afternoon.   [name], RN, BA, [place], [place] Direct Dial:  [phone]  Fax:  [phone] [name] [address] [place], [place]  [address]

## 2015-06-14 NOTE — Patient Outreach (Signed)
[facility]) Care Management  [place]  06/16/2015   WINSLOW EDERER 02-15-1951 [id number]  Subjective: Met with [name] for the first time since he was in the hospital and since he's been back to [place].  Slightly SOB when he walked into my office but HR 76bpm and 98%O2 sat on room air- he tells me he's come quickly across the parking lot.  [name] tells me he's contemplating retiring after spending some time in the hospital- he reports he has lots he wants to do. He reports fasting blood sugars 110-130 mg/dl and post prandial blood sugars 130-150 mg/dl.  He reports no hypo or hyperglycemia.  Objective:  Filed Vitals:   06/14/15 1625  BP: 128/76  Pulse: 76  Height: 1.702 m (5' 7" )  Weight: 200 lb 11.2 oz (91.037 kg)     Current Medications:  Current Outpatient Prescriptions  Medication Sig Dispense Refill  . amLODipine (NORVASC) 5 MG tablet Take 5 mg by mouth daily.     Marland Kitchen aspirin 81 MG tablet Take 81 mg by mouth daily.    Marland Kitchen atorvastatin (LIPITOR) 80 MG tablet Take 80 mg by mouth daily.    . cetirizine (ZYRTEC) 10 MG tablet Take 10 mg by mouth daily.    Marland Kitchen ezetimibe (ZETIA) 10 MG tablet Take 10 mg by mouth daily.    . ferrous fumarate (HEMOCYTE - 106 MG FE) 325 (106 FE) MG TABS tablet Take 1 tablet by mouth 2 (two) times daily.     Marland Kitchen FIBER FORMULA PO Take 2 tablets by mouth 2 (two) times daily.     . insulin aspart (NOVOLOG) 100 UNIT/ML injection Inject 15 Units into the skin 2 (two) times daily.     . Liraglutide (VICTOZA [place]) Inject 1.2 Units into the skin daily at 6 PM.     . metFORMIN (GLUCOPHAGE) 500 MG tablet Take 1,000 mg by mouth 2 (two) times daily with a meal.     . metoprolol (TOPROL-XL) 200 MG 24 hr tablet Take 200 mg by mouth daily.    . Multiple Vitamins-Minerals (MULTIVITAMIN WITH MINERALS) tablet Take 1 tablet by mouth daily.    . Omega-3 Fatty Acids (FISH OIL) 1000 MG CAPS Take 3,000 mg by mouth.    . pantoprazole (PROTONIX) 20 MG tablet Take 40  mg by mouth 2 (two) times daily before a meal.     . pregabalin (LYRICA) 75 MG capsule Take 75 mg by mouth 2 (two) times daily. Usually takes 1 at dinner time only    . sildenafil (VIAGRA) 100 MG tablet Take by mouth daily. Patient takes 2 tabs every 2 days    . spironolactone (ALDACTONE) 25 MG tablet Take 25 mg by mouth 2 (two) times daily.    Marland Kitchen triamcinolone cream (KENALOG) 0.1 % Apply 1 application topically 2 (two) times daily as needed (rash).     . valsartan-hydrochlorothiazide (DIOVAN-HCT) 320-25 MG per tablet Take 1 tablet by mouth daily.    . hydrocortisone (ANUSOL-HC) 25 MG suppository Place 1 suppository (25 mg total) rectally 2 (two) times daily. (Patient not taking: Reported on 06/14/2015) 24 suppository 0  . sildenafil (VIAGRA) 100 MG tablet Take 100 mg by mouth as needed for erectile dysfunction. Reported on 06/14/2015     No current facility-administered medications for this visit.    Functional Status:  In your present state of health, do you have any difficulty performing the following activities: 05/11/2015 05/11/2015  Hearing? - N  Vision? - N  Difficulty concentrating or making decisions? - N  Walking or climbing stairs? - N  Dressing or bathing? - N  Doing errands, shopping? N -    Fall/Depression Screening: PHQ 2/9 Scores 06/14/2015 03/15/2015 09/15/2014  PHQ - 2 Score 0 0 0    Assessment: [name] reports he's taking meds as ordered.  We discussed HCPOA, the need to check blood sugars a couple of times per day and the need to f/u with MDs as ordered. He is not currently exercising. He needs a dental exam which I have encouraged him to complete this before he leaves [place].   Plan: Last creatinine on 05/14/15 is 2.65m/dl- is this the latest result and should he still be on Metformin?  With his current kidney function- should he still be on sildenafil?  MRonalee Beltsis meeting with [facility] to discuss Medicare plans. He will follow up with me in one  month.   JGentry Fitz RN, BA, [place] Dial:  [phone] Fax:  [phone]-mail: [email or web address] [address] [place]  [id number]

## 2015-07-19 ENCOUNTER — Other Ambulatory Visit: Payer: Self-pay

## 2015-07-19 NOTE — Patient Outreach (Signed)
[facility]) Care Management  07/19/2015  Roger Knight 06-Nov-1950 [id number]   Called patient to get an update regarding his health.  Left a voice message to return my call.   [name], RN, BA, [place], [place] Direct Dial:  [phone]  Fax:  [phone] E-mail: [email or web address] [address], [place], [place]  [address]

## 2015-07-28 ENCOUNTER — Other Ambulatory Visit: Payer: Self-pay | Admitting: Pharmacist

## 2015-07-29 NOTE — Patient Outreach (Signed)
[facility]) Care Management  07/29/2015  RICKIE COYE 23-Oct-1950 [id number]   Mr. Hnat and his wife were referred by the [facility] nurse to discuss Medicare. Mr. Goulette will be 65 years old next month and is considering his options for health insurance when he retires.  Traditional Medicare with a stand-alone Medicare Part D prescription plan was compared to a Medicare Advantage Plan (Medicare A, B and D). Mr. Gannett received several handouts explaining the differences between Medicare Part D plans, Medicare Advantage Plans and Supplement (Medigap) plans. We reviewed the potential co-pays with his high dollar medications (Zetia, Novolog, Lantus and Victoza). Upon review, it appears that mail order will significantly lower the cost of his monthly co-pays. If Mr. Massoth chooses a Medicare Advantage plan, he will need to contact each of his providers to make sure they accept that plan. With traditional Medicare and a stand-alone prescription plan, he is able to see any provider. He is looking at several of the Medicare Supplement plans to help lower the costs of traditional Medicare. Mr. Harton will review the information provided and call me if he has any further questions or needs another consult.  Of note, Mr. Crupi was hospitalized in Jnau[place] of 2017. During this admission he experienced acute renal failure on chronic kidney disease per [facility] note. Mr. Antonovich states that he is "stable" now and being followed closely by his physicians.    Medications Reviewed Today    Reviewed by Cammy Copa, [facility] (Pharmacist) on 07/29/15 at [place] List Status: <None>   Medication Order Taking? Sig Documenting Provider Last Dose Status Informant   amLODipine (NORVASC) 5 MG tablet [id number] Yes Take 5 mg by mouth daily.  Historical Provider, MD Taking Active Self   aspirin 81 MG tablet [id number] Yes Take 81 mg by mouth daily. Historical Provider, MD Taking  Active    atorvastatin (LIPITOR) 80 MG tablet [id number] Yes Take 80 mg by mouth daily. Historical Provider, MD Taking Active Self   cetirizine (ZYRTEC) 10 MG tablet [id number] Yes Take 10 mg by mouth daily. Historical Provider, MD Taking Active Self   ezetimibe (ZETIA) 10 MG tablet [id number] Yes Take 10 mg by mouth daily. Historical Provider, MD Taking Active Self   ferrous fumarate (HEMOCYTE - 106 MG FE) 325 (106 FE) MG TABS tablet [id number] Yes Take 1 tablet by mouth 2 (two) times daily.  Historical Provider, MD Taking Active Self   FIBER FORMULA PO [id number] Yes Take 2 tablets by mouth 2 (two) times daily.  Historical Provider, MD Taking Active    hydrocortisone (ANUSOL-HC) 25 MG suppository [id number] No Place 1 suppository (25 mg total) rectally 2 (two) times daily.  Patient not taking:  Reported on 06/14/2015   Robert Bellow, MD Unknown Active    insulin aspart (NOVOLOG) 100 UNIT/ML injection [id number] Yes Inject 15 Units into the skin 2 (two) times daily.  Historical Provider, MD Taking Active Self   Liraglutide [facility]) [id number] Yes Inject 1.2 Units into the skin daily at 6 PM.  Historical Provider, MD Taking Active Self   metFORMIN (GLUCOPHAGE) 500 MG tablet [id number] Yes Take 1,000 mg by mouth 2 (two) times daily with a meal.  Historical Provider, MD Taking Active Self   metoprolol (TOPROL-XL) 200 MG 24 hr tablet [id number] Yes Take 200 mg by mouth daily. Historical Provider, MD Taking Active Self   Multiple Vitamins-Minerals (MULTIVITAMIN WITH MINERALS) tablet [id number] Yes Take 1 tablet by mouth daily. Historical Provider,  MD Taking Active Self   Omega-3 Fatty Acids (FISH OIL) 1000 MG CAPS [id number] Yes Take 3,000 mg by mouth. Historical Provider, MD Taking Active    pantoprazole (PROTONIX) 20 MG tablet [id number] Yes Take 40 mg by mouth 2 (two) times daily before a meal.  Historical Provider, MD Taking Active Self   pregabalin (LYRICA) 75 MG capsule [id number] Yes Take 75 mg by mouth  2 (two) times daily. Usually takes 1 at dinner time only Historical Provider, MD Taking Active Self   sildenafil (VIAGRA) 100 MG tablet [id number] Yes Take 100 mg by mouth as needed for erectile dysfunction. Reported on 06/14/2015 Historical Provider, MD Taking Active Self   sildenafil (VIAGRA) 100 MG tablet [id number] No Take by mouth daily. Patient takes 2 tabs every 2 days Historical Provider, MD Unknown Active Self   spironolactone (ALDACTONE) 25 MG tablet [id number] Yes Take 25 mg by mouth 2 (two) times daily. Historical Provider, MD Taking Active Self   triamcinolone cream (KENALOG) 0.1 % [id number] Yes Apply 1 application topically 2 (two) times daily as needed (rash).  Historical Provider, MD Taking Active Self   valsartan-hydrochlorothiazide (DIOVAN-HCT) 320-25 MG per tablet [id number] Yes Take 1 tablet by mouth daily. Historical Provider, MD Taking Active Self          PLAN:  1. Mr. Tuffy and his wife will review the Medicare information provided at today's visit and follow up if needed.  2. Per Mr. Baumhardt he is being closely monitor by his primary care physician, renal specialist, endocrinologist and the [facility] providers, in regards to his diabetes and renal function. 3. Mr. Stapleford will continue to follow up with Almyra Free [facility] nurse, for the [facility] diabetes management program.   Cleopatra [place]. Dicky Doe, PharmD [place] [facility] [phone]

## 2015-08-02 ENCOUNTER — Other Ambulatory Visit: Payer: Self-pay

## 2015-08-02 NOTE — Patient Outreach (Signed)
[facility]) Care Management  08/02/2015  DQUAN CORTOPASSI [birth year] [id number]  E-mail sent to [name] this morning.  08/18/2015 Appointment Cardiology Fath, Aloha Gell, MD  [address]  [place], [place] [address]  [phone]  [phone] (Fax)    08/20/2015 Appointment Lab Angelena Form, MD  [place], [place] [address]  [phone]  [phone] (Fax)    08/27/2015 Appointment  Angelena Form, MD  [place], [place] [address]  [phone]  [phone] (Fax)    08/30/2015 Appointment Lab Germain Osgood, MD  [place]  [facility] [place], [place] [address]  [phone]  [phone] (Fax)    09/06/2015 Appointment Endocrinology Solum, Felipa Evener, MD  [place]  [facility] [place], [place] [address]  [phone]  [phone] (Fax)     [name] met with Ellie Lunch of Medication Management regarding retirement insurance plans.  I have asked him to schedule to see me in June since he has multiple appointments with doctors in April and May.   [name], RN, BA, [place], [place] Direct Dial:  [phone]  Fax:  [phone] E-mail: [email or web address] [address], [place], [place]  [address]

## 2015-08-18 DIAGNOSIS — I200 Unstable angina: Secondary | ICD-10-CM | POA: Diagnosis not present

## 2015-08-18 DIAGNOSIS — I2510 Atherosclerotic heart disease of native coronary artery without angina pectoris: Secondary | ICD-10-CM | POA: Diagnosis not present

## 2015-08-18 DIAGNOSIS — N183 Chronic kidney disease, stage 3 (moderate): Secondary | ICD-10-CM | POA: Diagnosis not present

## 2015-08-18 DIAGNOSIS — I10 Essential (primary) hypertension: Secondary | ICD-10-CM | POA: Diagnosis not present

## 2015-08-19 ENCOUNTER — Other Ambulatory Visit: Payer: Self-pay | Admitting: Cardiology

## 2015-08-19 DIAGNOSIS — I2510 Atherosclerotic heart disease of native coronary artery without angina pectoris: Secondary | ICD-10-CM

## 2015-08-20 DIAGNOSIS — E785 Hyperlipidemia, unspecified: Secondary | ICD-10-CM | POA: Diagnosis not present

## 2015-08-20 DIAGNOSIS — N183 Chronic kidney disease, stage 3 (moderate): Secondary | ICD-10-CM | POA: Diagnosis not present

## 2015-08-20 DIAGNOSIS — E1142 Type 2 diabetes mellitus with diabetic polyneuropathy: Secondary | ICD-10-CM | POA: Diagnosis not present

## 2015-08-20 DIAGNOSIS — D500 Iron deficiency anemia secondary to blood loss (chronic): Secondary | ICD-10-CM | POA: Diagnosis not present

## 2015-08-20 DIAGNOSIS — I10 Essential (primary) hypertension: Secondary | ICD-10-CM | POA: Diagnosis not present

## 2015-08-27 ENCOUNTER — Encounter
Admission: RE | Admit: 2015-08-27 | Discharge: 2015-08-27 | Disposition: A | Discharge status: Home / Self Care | Payer: 59 | Source: Ambulatory Visit | Attending: Cardiology | Admitting: Cardiology

## 2015-08-27 DIAGNOSIS — I2510 Atherosclerotic heart disease of native coronary artery without angina pectoris: Secondary | ICD-10-CM | POA: Diagnosis not present

## 2015-08-27 MED ORDER — TECHNETIUM TC 99M SESTAMIBI GENERIC - CARDIOLITE
30.0000 | Freq: Once | INTRAVENOUS | Status: AC | PRN
  Administered 2015-08-27: 32.78 via INTRAVENOUS

## 2015-08-27 MED ORDER — REGADENOSON 0.4 MG/5ML IV SOLN
0.4000 mg | Freq: Once | INTRAVENOUS | Status: AC
  Administered 2015-08-27: 0.4 mg via INTRAVENOUS

## 2015-08-27 MED ORDER — TECHNETIUM TC 99M SESTAMIBI - CARDIOLITE
13.7900 | Freq: Once | INTRAVENOUS | Status: AC | PRN
  Administered 2015-08-27: 13.79 via INTRAVENOUS

## 2015-08-30 DIAGNOSIS — Z794 Long term (current) use of insulin: Secondary | ICD-10-CM | POA: Diagnosis not present

## 2015-08-30 DIAGNOSIS — N183 Chronic kidney disease, stage 3 (moderate): Secondary | ICD-10-CM | POA: Diagnosis not present

## 2015-08-30 DIAGNOSIS — E1122 Type 2 diabetes mellitus with diabetic chronic kidney disease: Secondary | ICD-10-CM | POA: Diagnosis not present

## 2015-09-01 DIAGNOSIS — I2510 Atherosclerotic heart disease of native coronary artery without angina pectoris: Secondary | ICD-10-CM | POA: Diagnosis not present

## 2015-09-01 DIAGNOSIS — I200 Unstable angina: Secondary | ICD-10-CM | POA: Diagnosis not present

## 2015-09-01 DIAGNOSIS — I10 Essential (primary) hypertension: Secondary | ICD-10-CM | POA: Diagnosis not present

## 2015-09-01 LAB — NM MYOCAR MULTI W/SPECT W/WALL MOTION / EF
CHL CUP NUCLEAR SDS: 3
CHL CUP NUCLEAR SSS: 12
CHL CUP RESTING HR STRESS: 71 {beats}/min
CHL CUP STRESS STAGE 2 GRADE: 0 %
CHL CUP STRESS STAGE 2 SPEED: 0 mph
CHL CUP STRESS STAGE 3 GRADE: 0 %
CHL CUP STRESS STAGE 3 HR: 68 {beats}/min
CHL CUP STRESS STAGE 3 SPEED: 0 mph
CHL CUP STRESS STAGE 5 GRADE: 0 %
CHL CUP STRESS STAGE 5 HR: 86 {beats}/min
CHL CUP STRESS STAGE 5 SPEED: 0 mph
CHL CUP STRESS STAGE 6 DBP: 78 mmHg
CHL CUP STRESS STAGE 6 SBP: 140 mmHg
CSEPPMHR: 54 %
Estimated workload: 1 METS
Exercise duration (min): 1 min
Exercise duration (sec): 20 s
LV dias vol: 124 mL (ref 62–150)
LV sys vol: 62 mL
Peak HR: 85 {beats}/min
SRS: 15
Stage 1 Grade: 0 %
Stage 1 HR: 69 {beats}/min
Stage 1 Speed: 0 mph
Stage 2 HR: 68 {beats}/min
Stage 4 Grade: 0 %
Stage 4 HR: 85 {beats}/min
Stage 4 Speed: 0 mph
Stage 6 Grade: 0 %
Stage 6 HR: 81 {beats}/min
Stage 6 Speed: 0 mph
TID: 0.8

## 2015-09-03 DIAGNOSIS — Z0000 Encounter for general adult medical examination without abnormal findings: Secondary | ICD-10-CM | POA: Diagnosis not present

## 2015-09-03 DIAGNOSIS — E1122 Type 2 diabetes mellitus with diabetic chronic kidney disease: Secondary | ICD-10-CM | POA: Diagnosis not present

## 2015-09-03 DIAGNOSIS — D500 Iron deficiency anemia secondary to blood loss (chronic): Secondary | ICD-10-CM | POA: Diagnosis not present

## 2015-09-03 DIAGNOSIS — E1142 Type 2 diabetes mellitus with diabetic polyneuropathy: Secondary | ICD-10-CM | POA: Diagnosis not present

## 2015-09-06 DIAGNOSIS — E1122 Type 2 diabetes mellitus with diabetic chronic kidney disease: Secondary | ICD-10-CM | POA: Diagnosis not present

## 2015-09-06 DIAGNOSIS — I10 Essential (primary) hypertension: Secondary | ICD-10-CM | POA: Diagnosis not present

## 2015-09-06 DIAGNOSIS — N183 Chronic kidney disease, stage 3 (moderate): Secondary | ICD-10-CM | POA: Diagnosis not present

## 2015-09-06 DIAGNOSIS — E1142 Type 2 diabetes mellitus with diabetic polyneuropathy: Secondary | ICD-10-CM | POA: Diagnosis not present

## 2015-09-08 DIAGNOSIS — N2581 Secondary hyperparathyroidism of renal origin: Secondary | ICD-10-CM | POA: Diagnosis not present

## 2015-09-08 DIAGNOSIS — I10 Essential (primary) hypertension: Secondary | ICD-10-CM | POA: Diagnosis not present

## 2015-09-08 DIAGNOSIS — N183 Chronic kidney disease, stage 3 (moderate): Secondary | ICD-10-CM | POA: Diagnosis not present

## 2015-09-08 DIAGNOSIS — D631 Anemia in chronic kidney disease: Secondary | ICD-10-CM | POA: Diagnosis not present

## 2015-09-08 DIAGNOSIS — R809 Proteinuria, unspecified: Secondary | ICD-10-CM | POA: Diagnosis not present

## 2015-09-23 DIAGNOSIS — Z23 Encounter for immunization: Secondary | ICD-10-CM | POA: Diagnosis not present

## 2015-10-08 DIAGNOSIS — E119 Type 2 diabetes mellitus without complications: Secondary | ICD-10-CM | POA: Diagnosis not present

## 2015-10-28 ENCOUNTER — Other Ambulatory Visit: Payer: Self-pay

## 2015-10-28 NOTE — Patient Outreach (Signed)
[facility]) Care Management  10/28/2015  ZIXUAN MACKLIN Sep 06, 1950 [id number]  I have sent discharge notes to Dr. Ola Spurr and to [place] regarding d/c from the LTW program.   [name], RN, BA, [place], [place]:  [phone]  Fax:  [phone] E-mail: [email or web address] [address], [place], [place]  [address]

## 2015-10-28 NOTE — Patient Outreach (Signed)
[facility]) Care Management  10/28/2015  Roger Knight [birth year] [id number]   Spoke to patient today.  He is retired from [employer] effective October 22, 2015.  He has signed up for Medicare. This makes him ineligible for Link to Wellness.   [name], RN, BA, [place], [place] Direct Dial:  [phone]  Fax:  [phone] E-mail: [email or web address] [address], [place], [place]  [address]

## 2016-05-18 DIAGNOSIS — D631 Anemia in chronic kidney disease: Secondary | ICD-10-CM | POA: Diagnosis not present

## 2016-05-18 DIAGNOSIS — N183 Chronic kidney disease, stage 3 (moderate): Secondary | ICD-10-CM | POA: Diagnosis not present

## 2016-05-18 DIAGNOSIS — I10 Essential (primary) hypertension: Secondary | ICD-10-CM | POA: Diagnosis not present

## 2016-05-18 DIAGNOSIS — N2581 Secondary hyperparathyroidism of renal origin: Secondary | ICD-10-CM | POA: Diagnosis not present

## 2016-06-12 DIAGNOSIS — E1122 Type 2 diabetes mellitus with diabetic chronic kidney disease: Secondary | ICD-10-CM | POA: Diagnosis not present

## 2016-06-12 DIAGNOSIS — I2510 Atherosclerotic heart disease of native coronary artery without angina pectoris: Secondary | ICD-10-CM | POA: Diagnosis not present

## 2016-06-12 DIAGNOSIS — D500 Iron deficiency anemia secondary to blood loss (chronic): Secondary | ICD-10-CM | POA: Diagnosis not present

## 2016-06-12 DIAGNOSIS — Z794 Long term (current) use of insulin: Secondary | ICD-10-CM | POA: Diagnosis not present

## 2016-06-12 DIAGNOSIS — N183 Chronic kidney disease, stage 3 (moderate): Secondary | ICD-10-CM | POA: Diagnosis not present

## 2016-06-15 ENCOUNTER — Inpatient Hospital Stay: Payer: Medicare Other | Attending: Oncology | Admitting: Oncology

## 2016-06-15 ENCOUNTER — Encounter: Payer: Self-pay | Admitting: Oncology

## 2016-06-15 VITALS — BP 174/71 | HR 57 | Temp 97.3°F | Wt 208.4 lb

## 2016-06-15 DIAGNOSIS — Z794 Long term (current) use of insulin: Secondary | ICD-10-CM | POA: Insufficient documentation

## 2016-06-15 DIAGNOSIS — Z85828 Personal history of other malignant neoplasm of skin: Secondary | ICD-10-CM | POA: Diagnosis not present

## 2016-06-15 DIAGNOSIS — E1142 Type 2 diabetes mellitus with diabetic polyneuropathy: Secondary | ICD-10-CM | POA: Insufficient documentation

## 2016-06-15 DIAGNOSIS — E785 Hyperlipidemia, unspecified: Secondary | ICD-10-CM | POA: Diagnosis not present

## 2016-06-15 DIAGNOSIS — I25110 Atherosclerotic heart disease of native coronary artery with unstable angina pectoris: Secondary | ICD-10-CM | POA: Insufficient documentation

## 2016-06-15 DIAGNOSIS — D472 Monoclonal gammopathy: Secondary | ICD-10-CM | POA: Diagnosis not present

## 2016-06-15 DIAGNOSIS — Z87891 Personal history of nicotine dependence: Secondary | ICD-10-CM | POA: Diagnosis not present

## 2016-06-15 DIAGNOSIS — D509 Iron deficiency anemia, unspecified: Secondary | ICD-10-CM

## 2016-06-15 DIAGNOSIS — E1149 Type 2 diabetes mellitus with other diabetic neurological complication: Secondary | ICD-10-CM

## 2016-06-15 DIAGNOSIS — Z7982 Long term (current) use of aspirin: Secondary | ICD-10-CM

## 2016-06-15 DIAGNOSIS — I129 Hypertensive chronic kidney disease with stage 1 through stage 4 chronic kidney disease, or unspecified chronic kidney disease: Secondary | ICD-10-CM | POA: Diagnosis not present

## 2016-06-15 DIAGNOSIS — I2510 Atherosclerotic heart disease of native coronary artery without angina pectoris: Secondary | ICD-10-CM | POA: Diagnosis not present

## 2016-06-15 DIAGNOSIS — N183 Chronic kidney disease, stage 3 (moderate): Secondary | ICD-10-CM | POA: Insufficient documentation

## 2016-06-15 DIAGNOSIS — K317 Polyp of stomach and duodenum: Secondary | ICD-10-CM | POA: Diagnosis not present

## 2016-06-15 DIAGNOSIS — E1122 Type 2 diabetes mellitus with diabetic chronic kidney disease: Secondary | ICD-10-CM | POA: Diagnosis not present

## 2016-06-15 DIAGNOSIS — M5116 Intervertebral disc disorders with radiculopathy, lumbar region: Secondary | ICD-10-CM | POA: Insufficient documentation

## 2016-06-15 DIAGNOSIS — K219 Gastro-esophageal reflux disease without esophagitis: Secondary | ICD-10-CM | POA: Diagnosis not present

## 2016-06-15 DIAGNOSIS — K644 Residual hemorrhoidal skin tags: Secondary | ICD-10-CM | POA: Diagnosis not present

## 2016-06-15 DIAGNOSIS — E213 Hyperparathyroidism, unspecified: Secondary | ICD-10-CM

## 2016-06-15 DIAGNOSIS — Z79899 Other long term (current) drug therapy: Secondary | ICD-10-CM | POA: Insufficient documentation

## 2016-06-15 LAB — COMPREHENSIVE METABOLIC PANEL
ALBUMIN: 3.5 g/dL (ref 3.5–5.0)
ALK PHOS: 59 U/L (ref 38–126)
ALT: 30 U/L (ref 17–63)
ANION GAP: 7 (ref 5–15)
AST: 22 U/L (ref 15–41)
BUN: 24 mg/dL — AB (ref 6–20)
CALCIUM: 9.2 mg/dL (ref 8.9–10.3)
CO2: 24 mmol/L (ref 22–32)
CREATININE: 1.97 mg/dL — AB (ref 0.61–1.24)
Chloride: 103 mmol/L (ref 101–111)
GFR calc Af Amer: 39 mL/min — ABNORMAL LOW (ref >60)
GFR calc non Af Amer: 34 mL/min — ABNORMAL LOW (ref >60)
GLUCOSE: 137 mg/dL — AB (ref 65–99)
Potassium: 4.4 mmol/L (ref 3.5–5.1)
SODIUM: 134 mmol/L — AB (ref 135–145)
Total Bilirubin: 0.5 mg/dL (ref 0.3–1.2)
Total Protein: 7.3 g/dL (ref 6.5–8.1)

## 2016-06-15 LAB — CBC WITH DIFFERENTIAL/PLATELET
BASOS PCT: 1 %
Basophils Absolute: 0.1 10*3/uL (ref 0–0.1)
EOS ABS: 0.1 10*3/uL (ref 0–0.7)
Eosinophils Relative: 1 %
HEMATOCRIT: 25.6 % — AB (ref 40.0–52.0)
HEMOGLOBIN: 8.3 g/dL — AB (ref 13.0–18.0)
Lymphocytes Relative: 23 %
Lymphs Abs: 1.5 10*3/uL (ref 1.0–3.6)
MCH: 24.2 pg — ABNORMAL LOW (ref 26.0–34.0)
MCHC: 32.5 g/dL (ref 32.0–36.0)
MCV: 74.7 fL — ABNORMAL LOW (ref 80.0–100.0)
Monocytes Absolute: 0.6 10*3/uL (ref 0.2–1.0)
Monocytes Relative: 9 %
NEUTROS ABS: 4.3 10*3/uL (ref 1.4–6.5)
NEUTROS PCT: 66 %
Platelets: 268 10*3/uL (ref 150–440)
RBC: 3.43 MIL/uL — ABNORMAL LOW (ref 4.40–5.90)
RDW: 17 % — ABNORMAL HIGH (ref 11.5–14.5)
WBC: 6.6 10*3/uL (ref 3.8–10.6)

## 2016-06-15 LAB — IRON AND TIBC
IRON: 21 ug/dL — AB (ref 45–182)
Saturation Ratios: 5 % — ABNORMAL LOW (ref 17.9–39.5)
TIBC: 429 ug/dL (ref 250–450)
UIBC: 408 ug/dL

## 2016-06-15 LAB — FERRITIN: Ferritin: 6 ng/mL — ABNORMAL LOW (ref 24–336)

## 2016-06-15 LAB — RETICULOCYTES
RBC.: 3.59 MIL/uL — ABNORMAL LOW (ref 4.40–5.90)
RETIC COUNT ABSOLUTE: 79 10*3/uL (ref 19.0–183.0)
RETIC CT PCT: 2.2 % (ref 0.4–3.1)

## 2016-06-15 LAB — VITAMIN B12: Vitamin B-12: 348 pg/mL (ref 180–914)

## 2016-06-15 LAB — LACTATE DEHYDROGENASE: LDH: 129 U/L (ref 98–192)

## 2016-06-15 LAB — FOLATE: Folate: 36 ng/mL (ref >5.9)

## 2016-06-15 NOTE — Progress Notes (Signed)
Patient denies pain or discomfort at this time.  BP 174/71, patient states this is not normal and he's a little anxious

## 2016-06-15 NOTE — Progress Notes (Signed)
Hematology/Oncology Consult note [facility] [phone] [phone]  Patient Care Team: Leonel Ramsay, MD as PCP - General (Infectious Diseases) Robert Bellow, MD (General Surgery) Leonel Ramsay, MD (Infectious Diseases) Anthonette Legato, MD (Internal Medicine)   Name of the patient: Roger Knight  [id number]  1951-03-06    Reason for referral- Dr. Holley Knight   Referring physician- Microcytic anemia  Date of visit: 06/15/16   History of presenting illness- Patient is a 66 year old male with a past medical history significant for hypertension, hyperparathyroidism and staged chronic kidney disease. He has been referred to [place] for evaluation and management of anemia. Most recent CBC from 05/19/2016 showed white count of 7.9, H&H of 9.4/29 with an MCV of 78 and a platelet count of 33. Prior CBC from May 2017 revealed H&H of 12/35.8. Patient also had a recent CBC on 06/12/2016 which showed an H&H of 8.1/26.3 with an MCV of 79.2. Ferritin from October 2017 was low normal at 33.Patient does have a history of hemorrhoids and reports on and off rectal bleeding. Denies any frequent use of NSAIDs. He has had a complete GI evaluation in the past including capsule endoscopy which did not reveal any source of bleeding. Patient has been hospitalized a couple of times and a past for iron deficiency anemia requiring blood transfusion.Patient had an EGD colonoscopy in January 2017. EGD showed a nonbleeding gastric polyp which was biopsied and colonoscopy showed internal and external hemorrhoids. The cause of his GI bleed in the past has been attributed to his hemorrhoids. Patient was on oral iron for about a year or so but stopped taking it in summer of 2017. Currently patient feels well and denies any symptoms of fever, shortness of breath on exertion or chest pain. Patient also has chronic kidney disease but has not received any EPO shots in the  past  ECOG PS- 1  Pain scale- 0   Review of systems- Review of Systems  Constitutional: Negative for chills, fever, malaise/fatigue and weight loss.  HENT: Negative for congestion, ear discharge and nosebleeds.   Eyes: Negative for blurred vision.  Respiratory: Negative for cough, hemoptysis, sputum production, shortness of breath and wheezing.   Cardiovascular: Negative for chest pain, palpitations, orthopnea and claudication.  Gastrointestinal: Negative for abdominal pain, blood in stool, constipation, diarrhea, heartburn, melena, nausea and vomiting.  Genitourinary: Negative for dysuria, flank pain, frequency, hematuria and urgency.  Musculoskeletal: Negative for back pain, joint pain and myalgias.  Skin: Negative for rash.  Neurological: Negative for dizziness, tingling, focal weakness, seizures, weakness and headaches.  Endo/Heme/Allergies: Does not bruise/bleed easily.  Psychiatric/Behavioral: Negative for depression and suicidal ideas. The patient does not have insomnia.     No Known Allergies  Patient Active Problem List   Diagnosis Date Noted  . Acute lower GI bleeding 06/01/2015  . External hemorrhoid 05/18/2015  . Internal hemorrhoids with complication 16/04/930  . GIB (gastrointestinal bleeding) 05/11/2015  . Type II diabetes mellitus with neurological manifestations ([place]) 11/16/2014  . Stage III chronic kidney disease 10/27/2014  . Coronary artery disease 10/27/2014  . GERD (gastroesophageal reflux disease) 10/27/2014  . Type 2 diabetes mellitus with peripheral neuropathy ([place]) 10/27/2014  . Dyslipidemia 10/27/2014  . PN (peripheral neuropathy) ([place]) 10/27/2014  . Anemia 10/13/2014  . Monoclonal gammopathy 10/13/2014  . Long-term insulin use ([place]) 05/05/2014  . Peripheral polyneuropathy ([place]) 05/05/2014  . Proteinuria 05/05/2014  . Benign essential hypertension 02/11/2014  . Intermediate coronary syndrome ([place]) 02/11/2014  . Arthritis  pain of hand 12/17/2013   . Lumbar radiculitis 12/17/2013  . DDD (degenerative disc disease), lumbar 12/17/2013  . Rectal bleeding 04/17/2013  . Acute blood loss anemia 04/17/2013     Past Medical History:  Diagnosis Date  . Accelerating angina ([place]) 02/11/2014  . Anemia   . Arthritis   . Carcinoma ([place]) 2012    skin cancer on neck [facility]  . Chronic kidney disease 2016  . Diabetes mellitus without complication ([place])   . GERD (gastroesophageal reflux disease)   . Hemorrhoids 2016-17  . Hernia   . Hyperlipidemia   . Hypertension   . Skin cancer      Past Surgical History:  Procedure Laterality Date  . COLONOSCOPY  April 2014  . COLONOSCOPY N/A 05/13/2015   Procedure: COLONOSCOPY;  Surgeon: Josefine Class, MD;  Location[facility] ENDOSCOPY;  Service: Endoscopy;  Laterality: N/A;  . CORONARY ARTERY BYPASS GRAFT  1996   [facility]  . CORONARY STENT PLACEMENT  2011   [facility]  . ESOPHAGOGASTRODUODENOSCOPY (EGD) WITH PROPOFOL N/A 05/13/2015   Procedure: ESOPHAGOGASTRODUODENOSCOPY (EGD) WITH PROPOFOL;  Surgeon: Josefine Class, MD;  Location: [facility] ENDOSCOPY;  Service: Endoscopy;  Laterality: N/A;  . UPPER GI ENDOSCOPY  2014    Social History   Social History  . Marital status: Married    Spouse name: N/A  . Number of children: N/A  . Years of education: N/A   Occupational History  . Not on file.   Social History Main Topics  . Smoking status: Former Smoker    Types: Cigarettes    Quit date: 04/24/2005  . Smokeless tobacco: Former Systems developer    Types: Chew  . Alcohol use No  . Drug use: No  . Sexual activity: Yes   Other Topics Concern  . Not on file   Social History Narrative  . No narrative on file     Family History  Problem Relation Age of Onset  . Dementia Mother 38  . Osteoarthritis Mother   . Cancer Other     skin cancer     Current Outpatient Prescriptions:  .  amLODipine (NORVASC) 5 MG tablet, Take 5 mg by mouth daily. , Disp: , Rfl:  .  aspirin 81 MG tablet, Take 81 mg by mouth  daily., Disp: , Rfl:  .  atorvastatin (LIPITOR) 80 MG tablet, Take 80 mg by mouth daily., Disp: , Rfl:  .  cetirizine (ZYRTEC) 10 MG tablet, Take 10 mg by mouth daily., Disp: , Rfl:  .  ezetimibe (ZETIA) 10 MG tablet, Take 10 mg by mouth daily., Disp: , Rfl:  .  ferrous fumarate (HEMOCYTE - 106 MG FE) 325 (106 FE) MG TABS tablet, Take 1 tablet by mouth 2 (two) times daily. , Disp: , Rfl:  .  FIBER FORMULA PO, Take 2 tablets by mouth 2 (two) times daily. , Disp: , Rfl:  .  insulin aspart (NOVOLOG) 100 UNIT/ML injection, Inject 15 Units into the skin 2 (two) times daily. , Disp: , Rfl:  .  Insulin Glargine (LANTUS SOLOSTAR) 100 UNIT/ML Solostar Pen, Inject 65 Units into the skin., Disp: , Rfl:  .  Insulin Pen Needle (FIFTY50 PEN NEEDLES) 32G X 4 MM MISC, USE WITH INJECTIONS 4 TIMES DAILY, Disp: , Rfl:  .  metFORMIN (GLUCOPHAGE) 500 MG tablet, Take 1,000 mg by mouth 2 (two) times daily with a meal. , Disp: , Rfl:  .  metoprolol (TOPROL-XL) 200 MG 24 hr tablet, Take 200 mg by mouth daily.,  Disp: , Rfl:  .  Multiple Vitamins-Minerals (MULTIVITAMIN WITH MINERALS) tablet, Take 1 tablet by mouth daily., Disp: , Rfl:  .  pantoprazole (PROTONIX) 20 MG tablet, Take 40 mg by mouth 2 (two) times daily before a meal. , Disp: , Rfl:  .  pregabalin (LYRICA) 75 MG capsule, Take 75 mg by mouth 2 (two) times daily. Usually takes 1 at dinner time only, Disp: , Rfl:  .  spironolactone (ALDACTONE) 25 MG tablet, Take 25 mg by mouth 2 (two) times daily., Disp: , Rfl:  .  triamcinolone cream (KENALOG) 0.1 %, Apply 1 application topically 2 (two) times daily as needed (rash). , Disp: , Rfl:  .  valsartan-hydrochlorothiazide (DIOVAN-HCT) 320-25 MG per tablet, Take 1 tablet by mouth daily., Disp: , Rfl:  .  Liraglutide (VICTOZA [place]), Inject 1.2 Units into the skin daily at 6 PM. , Disp: , Rfl:  .  sildenafil (VIAGRA) 100 MG tablet, Take 100 mg by mouth as needed for erectile dysfunction. Reported on 06/14/2015, Disp: , Rfl:  .   sildenafil (VIAGRA) 100 MG tablet, Take by mouth daily. Patient takes 2 tabs every 2 days, Disp: , Rfl:    Physical exam:  Vitals:   06/15/16 1423  BP: (!) 174/71  Pulse: (!) 57  Temp: 97.3 F (36.3 C)  TempSrc: Tympanic  Weight: 208 lb 7.1 oz (94.6 kg)   Physical Exam  Constitutional: He is oriented to person, place, and time and well-developed, well-nourished, and in no distress.  HENT:  Head: Normocephalic and atraumatic.  Eyes: EOM are normal. Pupils are equal, round, and reactive to light.  Neck: Normal range of motion.  Cardiovascular: Normal rate, regular rhythm and normal heart sounds.   Pulmonary/Chest: Effort normal and breath sounds normal.  Abdominal: Soft. Bowel sounds are normal.  Neurological: He is alert and oriented to person, place, and time.  Skin: Skin is warm and dry.       CMP Latest Ref Rng & Units 05/14/2015  Glucose 65 - 99 mg/dL 214(H)  BUN 6 - 20 mg/dL 21(H)  Creatinine 0.61 - 1.24 mg/dL 2.00(H)  Sodium 135 - 145 mmol/L 135  Potassium 3.5 - 5.1 mmol/L 4.2  Chloride 101 - 111 mmol/L 106  CO2 22 - 32 mmol/L 19(L)  Calcium 8.9 - 10.3 mg/dL 7.8(L)  Total Protein 6.4 - 8.2 g/dL -  Total Bilirubin 0.2 - 1.0 mg/dL -  Alkaline Phos Unit/L -  AST 15 - 37 Unit/L -  ALT 12 - 78 U/L -   CBC Latest Ref Rng & Units 05/15/2015  WBC 3.8 - 10.6 K/uL 5.8  Hemoglobin 13.0 - 18.0 g/dL 10.0(L)  Hematocrit 40.0 - 52.0 % 28.4(L)  Platelets 150 - 440 K/uL 168     Assessment and plan- Patient is a 66 y.o. male who has been referred to [place] for evaluation and management of microcytic anemia.  Patient's last visit concerning for iron deficiency and today I will repeat his CBC, CMP, ferritin, iron studies, B12, folate, LDH, reticulocyte count and haptoglobin as well as myeloma panel. If the patient has severe iron deficiency we will call him and schedule IV iron. I will tentatively see him in 2 weeks' time to discuss the results of his blood work. Patient has also  been encouraged to set up an appointment with Dr. Gustavo Lah from [facility] to see if he needs repeat endoscopy at this time. I will also check for celiac disease panel as well as stool H. Pylori antigen  Thank you for this kind referral and the opportunity to participate in the care of this patient   Visit Diagnosis 1. Microcytic anemia     Dr. Randa Evens, MD, MPH [facility] at [facility] Pager- [phone] 06/15/2016  3:23 PM

## 2016-06-16 ENCOUNTER — Other Ambulatory Visit: Payer: Self-pay | Admitting: *Deleted

## 2016-06-16 ENCOUNTER — Other Ambulatory Visit: Payer: Self-pay | Admitting: Oncology

## 2016-06-16 ENCOUNTER — Telehealth: Payer: Self-pay

## 2016-06-16 DIAGNOSIS — D509 Iron deficiency anemia, unspecified: Secondary | ICD-10-CM | POA: Insufficient documentation

## 2016-06-16 DIAGNOSIS — D500 Iron deficiency anemia secondary to blood loss (chronic): Secondary | ICD-10-CM

## 2016-06-16 LAB — PATHOLOGIST SMEAR REVIEW

## 2016-06-16 LAB — HAPTOGLOBIN: Haptoglobin: 277 mg/dL — ABNORMAL HIGH (ref 34–200)

## 2016-06-16 NOTE — Telephone Encounter (Signed)
Call to pt at home to tell him of iron level being low. Dr Janese Banks requesting that pt start iv iron once a week x 4.w. Dr Janese Banks  will see pt 4-6 weeks after completion. Pt in agreement w plan and stated early week is best. Thursday he cannot have the tx. Acknowledged understanding.  Pt aware that scheduling will call to set up times .

## 2016-06-17 LAB — CELIAC DISEASE PANEL
ENDOMYSIAL ANTIBODY IGA: NEGATIVE
IGA: 167 mg/dL (ref 61–437)

## 2016-06-19 DIAGNOSIS — I2510 Atherosclerotic heart disease of native coronary artery without angina pectoris: Secondary | ICD-10-CM | POA: Diagnosis not present

## 2016-06-19 DIAGNOSIS — I10 Essential (primary) hypertension: Secondary | ICD-10-CM | POA: Diagnosis not present

## 2016-06-19 DIAGNOSIS — N183 Chronic kidney disease, stage 3 (moderate): Secondary | ICD-10-CM | POA: Diagnosis not present

## 2016-06-19 DIAGNOSIS — E1142 Type 2 diabetes mellitus with diabetic polyneuropathy: Secondary | ICD-10-CM | POA: Diagnosis not present

## 2016-06-19 DIAGNOSIS — D500 Iron deficiency anemia secondary to blood loss (chronic): Secondary | ICD-10-CM | POA: Diagnosis not present

## 2016-06-19 LAB — MULTIPLE MYELOMA PANEL, SERUM
ALBUMIN/GLOB SERPL: 1.1 (ref 0.7–1.7)
Albumin SerPl Elph-Mcnc: 3.2 g/dL (ref 2.9–4.4)
Alpha 1: 0.2 g/dL (ref 0.0–0.4)
Alpha2 Glob SerPl Elph-Mcnc: 1.2 g/dL — ABNORMAL HIGH (ref 0.4–1.0)
B-GLOBULIN SERPL ELPH-MCNC: 0.8 g/dL (ref 0.7–1.3)
GAMMA GLOB SERPL ELPH-MCNC: 0.8 g/dL (ref 0.4–1.8)
GLOBULIN, TOTAL: 3 g/dL (ref 2.2–3.9)
IgA: 178 mg/dL (ref 61–437)
IgG (Immunoglobin G), Serum: 684 mg/dL — ABNORMAL LOW (ref 700–1600)
IgM, Serum: 145 mg/dL (ref 20–172)
Total Protein ELP: 6.2 g/dL (ref 6.0–8.5)

## 2016-06-20 ENCOUNTER — Inpatient Hospital Stay: Payer: Medicare Other

## 2016-06-20 VITALS — BP 164/78 | HR 64 | Temp 97.8°F

## 2016-06-20 DIAGNOSIS — D509 Iron deficiency anemia, unspecified: Secondary | ICD-10-CM | POA: Diagnosis not present

## 2016-06-20 DIAGNOSIS — E213 Hyperparathyroidism, unspecified: Secondary | ICD-10-CM | POA: Diagnosis not present

## 2016-06-20 DIAGNOSIS — D500 Iron deficiency anemia secondary to blood loss (chronic): Secondary | ICD-10-CM

## 2016-06-20 DIAGNOSIS — N183 Chronic kidney disease, stage 3 (moderate): Secondary | ICD-10-CM | POA: Diagnosis not present

## 2016-06-20 DIAGNOSIS — K317 Polyp of stomach and duodenum: Secondary | ICD-10-CM | POA: Diagnosis not present

## 2016-06-20 DIAGNOSIS — K644 Residual hemorrhoidal skin tags: Secondary | ICD-10-CM | POA: Diagnosis not present

## 2016-06-20 DIAGNOSIS — I129 Hypertensive chronic kidney disease with stage 1 through stage 4 chronic kidney disease, or unspecified chronic kidney disease: Secondary | ICD-10-CM | POA: Diagnosis not present

## 2016-06-20 MED ORDER — SODIUM CHLORIDE 0.9 % IV SOLN
Freq: Once | INTRAVENOUS | Status: AC
  Administered 2016-06-20: 14:00:00 via INTRAVENOUS
  Filled 2016-06-20: qty 1000

## 2016-06-20 MED ORDER — FERUMOXYTOL INJECTION 510 MG/17 ML
510.0000 mg | Freq: Once | INTRAVENOUS | Status: AC
  Administered 2016-06-20: 510 mg via INTRAVENOUS
  Filled 2016-06-20: qty 17

## 2016-06-27 ENCOUNTER — Inpatient Hospital Stay: Payer: Medicare Other | Attending: Oncology

## 2016-06-27 VITALS — BP 130/74 | HR 66 | Temp 97.5°F | Resp 20

## 2016-06-27 DIAGNOSIS — D509 Iron deficiency anemia, unspecified: Secondary | ICD-10-CM | POA: Diagnosis not present

## 2016-06-27 DIAGNOSIS — Z79899 Other long term (current) drug therapy: Secondary | ICD-10-CM | POA: Insufficient documentation

## 2016-06-27 DIAGNOSIS — D500 Iron deficiency anemia secondary to blood loss (chronic): Secondary | ICD-10-CM

## 2016-06-27 MED ORDER — SODIUM CHLORIDE 0.9 % IV SOLN
Freq: Once | INTRAVENOUS | Status: AC
  Administered 2016-06-27: 14:00:00 via INTRAVENOUS
  Filled 2016-06-27: qty 1000

## 2016-06-27 MED ORDER — FERUMOXYTOL INJECTION 510 MG/17 ML
510.0000 mg | Freq: Once | INTRAVENOUS | Status: AC
  Administered 2016-06-27: 510 mg via INTRAVENOUS
  Filled 2016-06-27: qty 17

## 2016-06-28 DIAGNOSIS — E1165 Type 2 diabetes mellitus with hyperglycemia: Secondary | ICD-10-CM | POA: Diagnosis not present

## 2016-06-28 DIAGNOSIS — N184 Chronic kidney disease, stage 4 (severe): Secondary | ICD-10-CM | POA: Diagnosis not present

## 2016-06-28 DIAGNOSIS — E1122 Type 2 diabetes mellitus with diabetic chronic kidney disease: Secondary | ICD-10-CM | POA: Diagnosis not present

## 2016-06-28 DIAGNOSIS — D500 Iron deficiency anemia secondary to blood loss (chronic): Secondary | ICD-10-CM | POA: Diagnosis not present

## 2016-06-28 DIAGNOSIS — Z794 Long term (current) use of insulin: Secondary | ICD-10-CM | POA: Diagnosis not present

## 2016-06-28 DIAGNOSIS — I10 Essential (primary) hypertension: Secondary | ICD-10-CM | POA: Diagnosis not present

## 2016-06-30 ENCOUNTER — Ambulatory Visit: Payer: Medicare Other | Admitting: Oncology

## 2016-07-04 ENCOUNTER — Inpatient Hospital Stay: Payer: Medicare Other

## 2016-07-04 VITALS — BP 163/81 | HR 58 | Temp 96.8°F | Resp 18

## 2016-07-04 DIAGNOSIS — D509 Iron deficiency anemia, unspecified: Secondary | ICD-10-CM | POA: Diagnosis not present

## 2016-07-04 DIAGNOSIS — Z79899 Other long term (current) drug therapy: Secondary | ICD-10-CM | POA: Diagnosis not present

## 2016-07-04 DIAGNOSIS — D500 Iron deficiency anemia secondary to blood loss (chronic): Secondary | ICD-10-CM

## 2016-07-04 MED ORDER — SODIUM CHLORIDE 0.9 % IV SOLN
510.0000 mg | Freq: Once | INTRAVENOUS | Status: AC
  Administered 2016-07-04: 510 mg via INTRAVENOUS
  Filled 2016-07-04: qty 17

## 2016-07-11 ENCOUNTER — Inpatient Hospital Stay: Payer: Medicare Other

## 2016-07-11 VITALS — BP 145/73 | HR 55 | Temp 98.8°F | Resp 16

## 2016-07-11 DIAGNOSIS — D509 Iron deficiency anemia, unspecified: Secondary | ICD-10-CM | POA: Diagnosis not present

## 2016-07-11 DIAGNOSIS — D500 Iron deficiency anemia secondary to blood loss (chronic): Secondary | ICD-10-CM

## 2016-07-11 DIAGNOSIS — Z79899 Other long term (current) drug therapy: Secondary | ICD-10-CM | POA: Diagnosis not present

## 2016-07-11 MED ORDER — SODIUM CHLORIDE 0.9 % IV SOLN
510.0000 mg | Freq: Once | INTRAVENOUS | Status: AC
  Administered 2016-07-11: 510 mg via INTRAVENOUS
  Filled 2016-07-11: qty 17

## 2016-07-11 MED ORDER — SODIUM CHLORIDE 0.9 % IV SOLN
Freq: Once | INTRAVENOUS | Status: AC
  Administered 2016-07-11: 14:00:00 via INTRAVENOUS
  Filled 2016-07-11: qty 1000

## 2016-08-24 ENCOUNTER — Inpatient Hospital Stay (HOSPITAL_BASED_OUTPATIENT_CLINIC_OR_DEPARTMENT_OTHER): Payer: Medicare Other | Admitting: Oncology

## 2016-08-24 ENCOUNTER — Telehealth: Payer: Self-pay | Admitting: *Deleted

## 2016-08-24 ENCOUNTER — Inpatient Hospital Stay: Payer: Medicare Other | Attending: Oncology

## 2016-08-24 VITALS — BP 144/78 | HR 62 | Temp 93.4°F | Resp 16 | Wt 213.4 lb

## 2016-08-24 DIAGNOSIS — I129 Hypertensive chronic kidney disease with stage 1 through stage 4 chronic kidney disease, or unspecified chronic kidney disease: Secondary | ICD-10-CM

## 2016-08-24 DIAGNOSIS — Z794 Long term (current) use of insulin: Secondary | ICD-10-CM | POA: Diagnosis not present

## 2016-08-24 DIAGNOSIS — E1122 Type 2 diabetes mellitus with diabetic chronic kidney disease: Secondary | ICD-10-CM

## 2016-08-24 DIAGNOSIS — Z85828 Personal history of other malignant neoplasm of skin: Secondary | ICD-10-CM | POA: Insufficient documentation

## 2016-08-24 DIAGNOSIS — Z8719 Personal history of other diseases of the digestive system: Secondary | ICD-10-CM | POA: Insufficient documentation

## 2016-08-24 DIAGNOSIS — E785 Hyperlipidemia, unspecified: Secondary | ICD-10-CM

## 2016-08-24 DIAGNOSIS — Z8711 Personal history of peptic ulcer disease: Secondary | ICD-10-CM | POA: Diagnosis not present

## 2016-08-24 DIAGNOSIS — N189 Chronic kidney disease, unspecified: Secondary | ICD-10-CM | POA: Diagnosis not present

## 2016-08-24 DIAGNOSIS — D509 Iron deficiency anemia, unspecified: Secondary | ICD-10-CM | POA: Diagnosis not present

## 2016-08-24 DIAGNOSIS — Z7982 Long term (current) use of aspirin: Secondary | ICD-10-CM | POA: Insufficient documentation

## 2016-08-24 DIAGNOSIS — Z87891 Personal history of nicotine dependence: Secondary | ICD-10-CM | POA: Diagnosis not present

## 2016-08-24 DIAGNOSIS — I10 Essential (primary) hypertension: Secondary | ICD-10-CM | POA: Insufficient documentation

## 2016-08-24 DIAGNOSIS — K219 Gastro-esophageal reflux disease without esophagitis: Secondary | ICD-10-CM

## 2016-08-24 DIAGNOSIS — E213 Hyperparathyroidism, unspecified: Secondary | ICD-10-CM | POA: Insufficient documentation

## 2016-08-24 LAB — CBC
HCT: 34.3 % — ABNORMAL LOW (ref 40.0–52.0)
Hemoglobin: 12 g/dL — ABNORMAL LOW (ref 13.0–18.0)
MCH: 30 pg (ref 26.0–34.0)
MCHC: 35 g/dL (ref 32.0–36.0)
MCV: 85.7 fL (ref 80.0–100.0)
PLATELETS: 210 10*3/uL (ref 150–440)
RBC: 4.01 MIL/uL — ABNORMAL LOW (ref 4.40–5.90)
RDW: 20.9 % — AB (ref 11.5–14.5)
WBC: 6.6 10*3/uL (ref 3.8–10.6)

## 2016-08-24 LAB — IRON AND TIBC
Iron: 80 ug/dL (ref 45–182)
SATURATION RATIOS: 29 % (ref 17.9–39.5)
TIBC: 274 ug/dL (ref 250–450)
UIBC: 194 ug/dL

## 2016-08-24 LAB — FERRITIN: Ferritin: 122 ng/mL (ref 24–336)

## 2016-08-24 NOTE — Progress Notes (Signed)
Patient here today for follow up.  Patient states no new concerns today  

## 2016-08-24 NOTE — Telephone Encounter (Signed)
Called pt to let him know tha hgb improved to 12 and his ferritin was normal level so he does not need any IV iron. He was thankful it was better

## 2016-08-24 NOTE — Telephone Encounter (Signed)
-----   Message from Sindy Guadeloupe, MD sent at 08/24/2016  3:58 PM EDT ----- Please let him know- no iv iron at this time

## 2016-08-24 NOTE — Progress Notes (Signed)
Please let him know- no iv iron at this time

## 2016-08-25 ENCOUNTER — Ambulatory Visit: Payer: Medicare Other | Admitting: Oncology

## 2016-08-25 ENCOUNTER — Other Ambulatory Visit: Payer: Medicare Other

## 2016-08-27 NOTE — Progress Notes (Signed)
Hematology/Oncology Consult note [facility]  [phone] [phone]  Patient Care Team: Leonel Ramsay, MD as PCP - General (Infectious Diseases) Bary Castilla, Forest Gleason, MD (General Surgery) Leonel Ramsay, MD (Infectious Diseases) Anthonette Legato, MD (Internal Medicine)   Name of the patient: Roger Knight  [id number]  May 27, 1950   Date of visit: 08/27/16  Diagnosis- iron deficiency anemia  Chief complaint/ Reason for visit- routine f/u  Heme/Onc history:Patient is a 66 year old male with a past medical history significant for hypertension, hyperparathyroidism and staged chronic kidney disease. He has been referred to [place] for evaluation and management of anemia. Most recent CBC from 05/19/2016 showed white count of 7.9, H&H of 9.4/29 with an MCV of 78 and a platelet count of 33. Prior CBC from May 2017 revealed H&H of 12/35.8. Patient also had a recent CBC on 06/12/2016 which showed an H&H of 8.1/26.3 with an MCV of 79.2. Ferritin from October 2017 was low normal at 33.Patient does have a history of hemorrhoids and reports on and off rectal bleeding. Denies any frequent use of NSAIDs. He has had a complete GI evaluation in the past including capsule endoscopy which did not reveal any source of bleeding. Patient has been hospitalized a couple of times and a past for iron deficiency anemia requiring blood transfusion.Patient had an EGD colonoscopy in January 2017. EGD showed a nonbleeding gastric polyp which was biopsied and colonoscopy showed internal and external hemorrhoids. The cause of his GI bleed in the past has been attributed to his hemorrhoids. Patient was on oral iron for about a year or so but stopped taking it in summer of 2017. Currently patient feels well and denies any symptoms of fever, shortness of breath on exertion or chest pain. Patient also has chronic kidney disease but has not received any EPO shots in the  past  Patient has received 4 doses of feraheme so far. Last dose 4/20  Interval history- he is doing well. Denies any complaints. Denies any blood loss in stool or urine  Review of systems- Review of Systems  Constitutional: Negative for chills, fever, malaise/fatigue and weight loss.  HENT: Negative for congestion, ear discharge and nosebleeds.   Eyes: Negative for blurred vision.  Respiratory: Negative for cough, hemoptysis, sputum production, shortness of breath and wheezing.   Cardiovascular: Negative for chest pain, palpitations, orthopnea and claudication.  Gastrointestinal: Negative for abdominal pain, blood in stool, constipation, diarrhea, heartburn, melena, nausea and vomiting.  Genitourinary: Negative for dysuria, flank pain, frequency, hematuria and urgency.  Musculoskeletal: Negative for back pain, joint pain and myalgias.  Skin: Negative for rash.  Neurological: Negative for dizziness, tingling, focal weakness, seizures, weakness and headaches.  Endo/Heme/Allergies: Does not bruise/bleed easily.  Psychiatric/Behavioral: Negative for depression and suicidal ideas. The patient does not have insomnia.      Current treatment- IV iron  No Known Allergies   Past Medical History:  Diagnosis Date  . Accelerating angina ([place]) 02/11/2014  . Anemia   . Arthritis   . Carcinoma ([place]) 2012    skin cancer on neck [facility]  . Chronic kidney disease 2016  . Diabetes mellitus without complication ([place])   . GERD (gastroesophageal reflux disease)   . Hemorrhoids 2016-17  . Hernia   . Hyperlipidemia   . Hypertension   . Skin cancer      Past Surgical History:  Procedure Laterality Date  . COLONOSCOPY  April 2014  . COLONOSCOPY N/A 05/13/2015   Procedure: COLONOSCOPY;  Surgeon: Josefine Class, MD;  Location: [facility] ENDOSCOPY;  Service: Endoscopy;  Laterality: N/A;  . CORONARY ARTERY BYPASS GRAFT  1996   [facility]  . CORONARY STENT PLACEMENT  2011   [facility]  .  ESOPHAGOGASTRODUODENOSCOPY (EGD) WITH PROPOFOL N/A 05/13/2015   Procedure: ESOPHAGOGASTRODUODENOSCOPY (EGD) WITH PROPOFOL;  Surgeon: Josefine Class, MD;  Location[facility] ENDOSCOPY;  Service: Endoscopy;  Laterality: N/A;  . UPPER GI ENDOSCOPY  2014    Social History   Social History  . Marital status: Married    Spouse name: N/A  . Number of children: N/A  . Years of education: N/A   Occupational History  . Not on file.   Social History Main Topics  . Smoking status: Former Smoker    Types: Cigarettes    Quit date: 04/24/2005  . Smokeless tobacco: Former Systems developer    Types: Chew  . Alcohol use No  . Drug use: No  . Sexual activity: Yes   Other Topics Concern  . Not on file   Social History Narrative  . No narrative on file    Family History  Problem Relation Age of Onset  . Dementia Mother 61  . Osteoarthritis Mother   . Cancer Other     skin cancer     Current Outpatient Prescriptions:  .  amLODipine (NORVASC) 5 MG tablet, Take 5 mg by mouth daily. , Disp: , Rfl:  .  aspirin 81 MG tablet, Take 81 mg by mouth daily., Disp: , Rfl:  .  atorvastatin (LIPITOR) 80 MG tablet, Take 80 mg by mouth daily., Disp: , Rfl:  .  cetirizine (ZYRTEC) 10 MG tablet, Take 10 mg by mouth daily., Disp: , Rfl:  .  ezetimibe (ZETIA) 10 MG tablet, Take 10 mg by mouth daily., Disp: , Rfl:  .  FIBER FORMULA PO, Take 2 tablets by mouth 2 (two) times daily. , Disp: , Rfl:  .  gabapentin (NEURONTIN) 300 MG capsule, Take 300 mg by mouth at bedtime., Disp: , Rfl:  .  insulin aspart (NOVOLOG) 100 UNIT/ML injection, Inject 15 Units into the skin 2 (two) times daily. , Disp: , Rfl:  .  Insulin Glargine (LANTUS SOLOSTAR) 100 UNIT/ML Solostar Pen, Inject 65 Units into the skin., Disp: , Rfl:  .  metoprolol (TOPROL-XL) 200 MG 24 hr tablet, Take 200 mg by mouth daily., Disp: , Rfl:  .  Multiple Vitamins-Minerals (MULTIVITAMIN WITH MINERALS) tablet, Take 1 tablet by mouth daily., Disp: , Rfl:  .   pantoprazole (PROTONIX) 20 MG tablet, Take 40 mg by mouth 2 (two) times daily before a meal. , Disp: , Rfl:  .  sildenafil (VIAGRA) 100 MG tablet, Take 100 mg by mouth as needed for erectile dysfunction. Reported on 06/14/2015, Disp: , Rfl:  .  spironolactone (ALDACTONE) 25 MG tablet, Take 25 mg by mouth 2 (two) times daily., Disp: , Rfl:  .  triamcinolone cream (KENALOG) 0.1 %, Apply 1 application topically 2 (two) times daily as needed (rash). , Disp: , Rfl:  .  valsartan-hydrochlorothiazide (DIOVAN-HCT) 320-25 MG per tablet, Take 1 tablet by mouth daily., Disp: , Rfl:  .  Insulin Pen Needle (FIFTY50 PEN NEEDLES) 32G X 4 MM MISC, USE WITH INJECTIONS 4 TIMES DAILY, Disp: , Rfl:   Physical exam:  Vitals:   08/24/16 1510  BP: (!) 144/78  Pulse: 62  Resp: 16  Temp: (!) 93.4 F (34.1 C)  TempSrc: Tympanic  Weight: 213 lb 7 oz (96.8 kg)   Physical  Exam  Constitutional: He is oriented to person, place, and time and well-developed, well-nourished, and in no distress.  HENT:  Head: Normocephalic and atraumatic.  Eyes: EOM are normal. Pupils are equal, round, and reactive to light.  Neck: Normal range of motion.  Cardiovascular: Normal rate, regular rhythm and normal heart sounds.   Pulmonary/Chest: Effort normal and breath sounds normal.  Abdominal: Soft. Bowel sounds are normal.  Neurological: He is alert and oriented to person, place, and time.  Skin: Skin is warm and dry.     CMP Latest Ref Rng & Units 06/15/2016  Glucose 65 - 99 mg/dL 137(H)  BUN 6 - 20 mg/dL 24(H)  Creatinine 0.61 - 1.24 mg/dL 1.97(H)  Sodium 135 - 145 mmol/L 134(L)  Potassium 3.5 - 5.1 mmol/L 4.4  Chloride 101 - 111 mmol/L 103  CO2 22 - 32 mmol/L 24  Calcium 8.9 - 10.3 mg/dL 9.2  Total Protein 6.5 - 8.1 g/dL 7.3  Total Bilirubin 0.3 - 1.2 mg/dL 0.5  Alkaline Phos 38 - 126 U/L 59  AST 15 - 41 U/L 22  ALT 17 - 63 U/L 30   CBC Latest Ref Rng & Units 08/24/2016  WBC 3.8 - 10.6 K/uL 6.6  Hemoglobin 13.0 - 18.0  g/dL 12.0(L)  Hematocrit 40.0 - 52.0 % 34.3(L)  Platelets 150 - 440 K/uL 210      Assessment and plan- Patient is a 66 y.o. male referred to [place] for iron deficiency anemia likely due to bleeding hemorrhoids  Hemoglobin significantly improved to 12 from 8.9. Iron studies are normal. No more IV iron at this time. Repeat iron studies in 6 weeks and I will see him in 3 months with bloodwork. Will decide about IV iron based on blood work   Visit Diagnosis 1. Iron deficiency anemia, unspecified iron deficiency anemia type      Dr. Randa Evens, MD, MPH [facility] at [facility] Pager- [phone] 08/27/2016 8:55 AM

## 2016-08-28 ENCOUNTER — Other Ambulatory Visit: Payer: Self-pay | Admitting: *Deleted

## 2016-08-28 DIAGNOSIS — D509 Iron deficiency anemia, unspecified: Secondary | ICD-10-CM

## 2016-08-30 DIAGNOSIS — Z794 Long term (current) use of insulin: Secondary | ICD-10-CM | POA: Diagnosis not present

## 2016-08-30 DIAGNOSIS — E1165 Type 2 diabetes mellitus with hyperglycemia: Secondary | ICD-10-CM | POA: Diagnosis not present

## 2016-08-30 DIAGNOSIS — E1142 Type 2 diabetes mellitus with diabetic polyneuropathy: Secondary | ICD-10-CM | POA: Diagnosis not present

## 2016-08-30 DIAGNOSIS — E1122 Type 2 diabetes mellitus with diabetic chronic kidney disease: Secondary | ICD-10-CM | POA: Diagnosis not present

## 2016-08-30 DIAGNOSIS — I10 Essential (primary) hypertension: Secondary | ICD-10-CM | POA: Diagnosis not present

## 2016-08-30 DIAGNOSIS — N184 Chronic kidney disease, stage 4 (severe): Secondary | ICD-10-CM | POA: Diagnosis not present

## 2016-09-07 DIAGNOSIS — I2510 Atherosclerotic heart disease of native coronary artery without angina pectoris: Secondary | ICD-10-CM | POA: Diagnosis not present

## 2016-09-07 DIAGNOSIS — E1142 Type 2 diabetes mellitus with diabetic polyneuropathy: Secondary | ICD-10-CM | POA: Diagnosis not present

## 2016-09-07 DIAGNOSIS — I200 Unstable angina: Secondary | ICD-10-CM | POA: Diagnosis not present

## 2016-09-07 DIAGNOSIS — I10 Essential (primary) hypertension: Secondary | ICD-10-CM | POA: Diagnosis not present

## 2016-09-14 DIAGNOSIS — R809 Proteinuria, unspecified: Secondary | ICD-10-CM | POA: Diagnosis not present

## 2016-09-14 DIAGNOSIS — E1122 Type 2 diabetes mellitus with diabetic chronic kidney disease: Secondary | ICD-10-CM | POA: Diagnosis not present

## 2016-09-14 DIAGNOSIS — N2581 Secondary hyperparathyroidism of renal origin: Secondary | ICD-10-CM | POA: Diagnosis not present

## 2016-09-14 DIAGNOSIS — D631 Anemia in chronic kidney disease: Secondary | ICD-10-CM | POA: Diagnosis not present

## 2016-09-14 DIAGNOSIS — N183 Chronic kidney disease, stage 3 (moderate): Secondary | ICD-10-CM | POA: Diagnosis not present

## 2016-09-29 ENCOUNTER — Other Ambulatory Visit: Payer: Self-pay | Admitting: Oncology

## 2016-09-29 ENCOUNTER — Inpatient Hospital Stay: Payer: Medicare Other | Attending: Oncology | Admitting: Oncology

## 2016-09-29 DIAGNOSIS — D509 Iron deficiency anemia, unspecified: Secondary | ICD-10-CM | POA: Diagnosis not present

## 2016-09-29 DIAGNOSIS — Z79899 Other long term (current) drug therapy: Secondary | ICD-10-CM | POA: Diagnosis not present

## 2016-09-29 LAB — CBC
HCT: 29.3 % — ABNORMAL LOW (ref 40.0–52.0)
HEMOGLOBIN: 10.4 g/dL — AB (ref 13.0–18.0)
MCH: 31.7 pg (ref 26.0–34.0)
MCHC: 35.4 g/dL (ref 32.0–36.0)
MCV: 89.5 fL (ref 80.0–100.0)
PLATELETS: 242 10*3/uL (ref 150–440)
RBC: 3.28 MIL/uL — AB (ref 4.40–5.90)
RDW: 14.7 % — ABNORMAL HIGH (ref 11.5–14.5)
WBC: 5.7 10*3/uL (ref 3.8–10.6)

## 2016-09-29 LAB — FERRITIN: FERRITIN: 21 ng/mL — AB (ref 24–336)

## 2016-09-29 LAB — IRON AND TIBC
IRON: 37 ug/dL — AB (ref 45–182)
SATURATION RATIOS: 12 % — AB (ref 17.9–39.5)
TIBC: 312 ug/dL (ref 250–450)
UIBC: 275 ug/dL

## 2016-09-29 NOTE — Progress Notes (Signed)
His hb is down to 10. Ferritin is back down. Please inform him. We will set him up for feraheme X2. He needs to GI again. Not sure who he has seen in the past

## 2016-10-04 ENCOUNTER — Telehealth: Payer: Self-pay | Admitting: *Deleted

## 2016-10-04 ENCOUNTER — Other Ambulatory Visit: Payer: Self-pay | Admitting: Oncology

## 2016-10-04 ENCOUNTER — Other Ambulatory Visit: Payer: Self-pay | Admitting: *Deleted

## 2016-10-04 NOTE — Telephone Encounter (Signed)
-----   Message from Sindy Guadeloupe, MD sent at 09/29/2016 10:33 AM EDT ----- His hb is down to 10. Ferritin is back down. Please inform him. We will set him up for feraheme X2. He needs to GI again. Not sure who he has seen in the past

## 2016-10-04 NOTE — Telephone Encounter (Signed)
Called pt and gave him the hgb and the ferritin drop and she wants pt to have 2 doses of feraheme and he is agreeable to 6/20 and 6/25 at 1:30. appts made.

## 2016-10-04 NOTE — Telephone Encounter (Signed)
I spoke to pt about the GI appt and he saw [facility] and hte PA that works with him.  He said that they went over him and never found anything but he will be glad to be checked out again.  I called GI and they want me to fax over info about his labs and md notes to get him a new appt. I have faxed the info to [phone]

## 2016-10-11 ENCOUNTER — Inpatient Hospital Stay: Payer: Medicare Other

## 2016-10-11 VITALS — BP 154/83 | HR 60 | Temp 96.8°F | Resp 18

## 2016-10-11 DIAGNOSIS — Z79899 Other long term (current) drug therapy: Secondary | ICD-10-CM | POA: Diagnosis not present

## 2016-10-11 DIAGNOSIS — D509 Iron deficiency anemia, unspecified: Secondary | ICD-10-CM | POA: Diagnosis not present

## 2016-10-11 DIAGNOSIS — D500 Iron deficiency anemia secondary to blood loss (chronic): Secondary | ICD-10-CM

## 2016-10-11 MED ORDER — SODIUM CHLORIDE 0.9 % IV SOLN
Freq: Once | INTRAVENOUS | Status: AC
  Administered 2016-10-11: 14:00:00 via INTRAVENOUS
  Filled 2016-10-11: qty 1000

## 2016-10-11 MED ORDER — SODIUM CHLORIDE 0.9 % IV SOLN
510.0000 mg | Freq: Once | INTRAVENOUS | Status: AC
  Administered 2016-10-11: 510 mg via INTRAVENOUS
  Filled 2016-10-11: qty 17

## 2016-10-16 ENCOUNTER — Inpatient Hospital Stay: Payer: Medicare Other

## 2016-10-16 VITALS — BP 158/89 | HR 56 | Temp 96.2°F | Resp 18

## 2016-10-16 DIAGNOSIS — Z79899 Other long term (current) drug therapy: Secondary | ICD-10-CM | POA: Diagnosis not present

## 2016-10-16 DIAGNOSIS — D509 Iron deficiency anemia, unspecified: Secondary | ICD-10-CM | POA: Diagnosis not present

## 2016-10-16 DIAGNOSIS — D500 Iron deficiency anemia secondary to blood loss (chronic): Secondary | ICD-10-CM

## 2016-10-16 MED ORDER — SODIUM CHLORIDE 0.9 % IV SOLN
510.0000 mg | Freq: Once | INTRAVENOUS | Status: AC
  Administered 2016-10-16: 510 mg via INTRAVENOUS
  Filled 2016-10-16: qty 17

## 2016-10-16 MED ORDER — SODIUM CHLORIDE 0.9 % IV SOLN
Freq: Once | INTRAVENOUS | Status: AC
  Administered 2016-10-16: 14:00:00 via INTRAVENOUS
  Filled 2016-10-16: qty 1000

## 2016-10-30 DIAGNOSIS — E1165 Type 2 diabetes mellitus with hyperglycemia: Secondary | ICD-10-CM | POA: Diagnosis not present

## 2016-10-30 DIAGNOSIS — Z794 Long term (current) use of insulin: Secondary | ICD-10-CM | POA: Diagnosis not present

## 2016-11-06 DIAGNOSIS — E1122 Type 2 diabetes mellitus with diabetic chronic kidney disease: Secondary | ICD-10-CM | POA: Diagnosis not present

## 2016-11-06 DIAGNOSIS — Z794 Long term (current) use of insulin: Secondary | ICD-10-CM | POA: Diagnosis not present

## 2016-11-06 DIAGNOSIS — E1159 Type 2 diabetes mellitus with other circulatory complications: Secondary | ICD-10-CM | POA: Diagnosis not present

## 2016-11-06 DIAGNOSIS — E1142 Type 2 diabetes mellitus with diabetic polyneuropathy: Secondary | ICD-10-CM | POA: Diagnosis not present

## 2016-11-06 DIAGNOSIS — N183 Chronic kidney disease, stage 3 (moderate): Secondary | ICD-10-CM | POA: Diagnosis not present

## 2016-11-17 ENCOUNTER — Inpatient Hospital Stay: Payer: Medicare Other | Attending: Oncology | Admitting: Oncology

## 2016-11-17 ENCOUNTER — Inpatient Hospital Stay: Payer: Medicare Other

## 2016-11-17 VITALS — BP 156/84 | HR 64 | Temp 96.8°F | Resp 16 | Wt 206.1 lb

## 2016-11-17 DIAGNOSIS — K219 Gastro-esophageal reflux disease without esophagitis: Secondary | ICD-10-CM | POA: Diagnosis not present

## 2016-11-17 DIAGNOSIS — Z85828 Personal history of other malignant neoplasm of skin: Secondary | ICD-10-CM | POA: Diagnosis not present

## 2016-11-17 DIAGNOSIS — Z794 Long term (current) use of insulin: Secondary | ICD-10-CM

## 2016-11-17 DIAGNOSIS — Z87891 Personal history of nicotine dependence: Secondary | ICD-10-CM | POA: Diagnosis not present

## 2016-11-17 DIAGNOSIS — I200 Unstable angina: Secondary | ICD-10-CM

## 2016-11-17 DIAGNOSIS — E213 Hyperparathyroidism, unspecified: Secondary | ICD-10-CM

## 2016-11-17 DIAGNOSIS — N183 Chronic kidney disease, stage 3 (moderate): Secondary | ICD-10-CM | POA: Diagnosis not present

## 2016-11-17 DIAGNOSIS — M129 Arthropathy, unspecified: Secondary | ICD-10-CM

## 2016-11-17 DIAGNOSIS — I129 Hypertensive chronic kidney disease with stage 1 through stage 4 chronic kidney disease, or unspecified chronic kidney disease: Secondary | ICD-10-CM

## 2016-11-17 DIAGNOSIS — Z79899 Other long term (current) drug therapy: Secondary | ICD-10-CM

## 2016-11-17 DIAGNOSIS — E119 Type 2 diabetes mellitus without complications: Secondary | ICD-10-CM | POA: Diagnosis not present

## 2016-11-17 DIAGNOSIS — E785 Hyperlipidemia, unspecified: Secondary | ICD-10-CM | POA: Diagnosis not present

## 2016-11-17 DIAGNOSIS — D509 Iron deficiency anemia, unspecified: Secondary | ICD-10-CM | POA: Diagnosis not present

## 2016-11-17 DIAGNOSIS — Z86010 Personal history of colonic polyps: Secondary | ICD-10-CM | POA: Diagnosis not present

## 2016-11-17 DIAGNOSIS — Z7982 Long term (current) use of aspirin: Secondary | ICD-10-CM

## 2016-11-17 LAB — FERRITIN: FERRITIN: 152 ng/mL (ref 24–336)

## 2016-11-17 LAB — CBC
HEMATOCRIT: 36.6 % — AB (ref 40.0–52.0)
Hemoglobin: 12.9 g/dL — ABNORMAL LOW (ref 13.0–18.0)
MCH: 31.6 pg (ref 26.0–34.0)
MCHC: 35.1 g/dL (ref 32.0–36.0)
MCV: 90 fL (ref 80.0–100.0)
Platelets: 225 10*3/uL (ref 150–440)
RBC: 4.07 MIL/uL — AB (ref 4.40–5.90)
RDW: 15.3 % — ABNORMAL HIGH (ref 11.5–14.5)
WBC: 10.2 10*3/uL (ref 3.8–10.6)

## 2016-11-17 LAB — IRON AND TIBC
IRON: 55 ug/dL (ref 45–182)
Saturation Ratios: 18 % (ref 17.9–39.5)
TIBC: 310 ug/dL (ref 250–450)
UIBC: 255 ug/dL

## 2016-11-17 NOTE — Progress Notes (Signed)
Patient does not offer any problems today.  

## 2016-11-17 NOTE — Progress Notes (Signed)
Hematology/Oncology Consult note [facility]  Telephone:([phone] [phone]  Patient Care Team: Leonel Ramsay, MD as PCP - General (Infectious Diseases) Bary Castilla, Forest Gleason, MD (General Surgery) Leonel Ramsay, MD (Infectious Diseases) Anthonette Legato, MD (Internal Medicine)   Name of the patient: Roger Knight  [id number]  20-Mar-1951   Date of visit: 11/17/16  Diagnosis- iron deficiency anemia  Chief complaint/ Reason for visit- routine f/u  Heme/Onc history:Patient is a 66 year old male with a past medical history significant for hypertension, hyperparathyroidism and staged chronic kidney disease. He has been referred to [place] for evaluation and management of anemia. Most recent CBC from 05/19/2016 showed white count of 7.9, H&H of 9.4/29 with an MCV of 78 and a platelet count of 33. Prior CBC from May 2017 revealed H&H of 12/35.8. Patient also had a recent CBC on 06/12/2016 which showed an H&H of 8.1/26.3 with an MCV of 79.2. Ferritin from October 2017 was low normal at 33.Patient does have a history of hemorrhoids and reports on and off rectal bleeding. Denies any frequent use of NSAIDs. He has had a complete GI evaluation in the past including capsule endoscopy which did not reveal any source of bleeding. Patient has been hospitalized a couple of times and a past for iron deficiency anemia requiring blood transfusion.Patient had an EGD colonoscopy in January 2017. EGD showed a nonbleeding gastric polyp which was biopsied and colonoscopy showed internal and external hemorrhoids. The cause of his GI bleed in the past has been attributed to his hemorrhoids. Patient was on oral iron for about a year or so but stopped taking it in summer of 2017. Currently patient feels well and denies any symptoms of fever, shortness of breath on exertion or chest pain. Patient also has chronic kidney disease but has not received any EPO shots in the  past  Patient has received 6 doses of feraheme so far. Last dose in June 2018  Interval history- doing well. Reports no GI bleeding  ECOG PS- 0 Pain scale- 0  Review of systems- Review of Systems  Constitutional: Negative for chills, fever, malaise/fatigue and weight loss.  HENT: Negative for congestion, ear discharge and nosebleeds.   Eyes: Negative for blurred vision.  Respiratory: Negative for cough, hemoptysis, sputum production, shortness of breath and wheezing.   Cardiovascular: Negative for chest pain, palpitations, orthopnea and claudication.  Gastrointestinal: Negative for abdominal pain, blood in stool, constipation, diarrhea, heartburn, melena, nausea and vomiting.  Genitourinary: Negative for dysuria, flank pain, frequency, hematuria and urgency.  Musculoskeletal: Negative for back pain, joint pain and myalgias.  Skin: Negative for rash.  Neurological: Negative for dizziness, tingling, focal weakness, seizures, weakness and headaches.  Endo/Heme/Allergies: Does not bruise/bleed easily.  Psychiatric/Behavioral: Negative for depression and suicidal ideas. The patient does not have insomnia.       No Known Allergies   Past Medical History:  Diagnosis Date  . Accelerating angina ([place]) 02/11/2014  . Anemia   . Arthritis   . Carcinoma ([place]) 2012    skin cancer on neck [facility]  . Chronic kidney disease 2016  . Diabetes mellitus without complication ([place])   . GERD (gastroesophageal reflux disease)   . Hemorrhoids 2016-17  . Hernia   . Hyperlipidemia   . Hypertension   . Skin cancer      Past Surgical History:  Procedure Laterality Date  . COLONOSCOPY  April 2014  . COLONOSCOPY N/A 05/13/2015   Procedure: COLONOSCOPY;  Surgeon: Josefine Class,  MD;  Location: [facility] ENDOSCOPY;  Service: Endoscopy;  Laterality: N/A;  . CORONARY ARTERY BYPASS GRAFT  1996   [facility]  . CORONARY STENT PLACEMENT  2011   [facility]  . ESOPHAGOGASTRODUODENOSCOPY (EGD) WITH PROPOFOL N/A  05/13/2015   Procedure: ESOPHAGOGASTRODUODENOSCOPY (EGD) WITH PROPOFOL;  Surgeon: Josefine Class, MD;  Location: [facility] ENDOSCOPY;  Service: Endoscopy;  Laterality: N/A;  . UPPER GI ENDOSCOPY  2014    Social History   Social History  . Marital status: Married    Spouse name: N/A  . Number of children: N/A  . Years of education: N/A   Occupational History  . Not on file.   Social History Main Topics  . Smoking status: Former Smoker    Types: Cigarettes    Quit date: 04/24/2005  . Smokeless tobacco: Former Systems developer    Types: Chew  . Alcohol use No  . Drug use: No  . Sexual activity: Yes   Other Topics Concern  . Not on file   Social History Narrative  . No narrative on file    Family History  Problem Relation Age of Onset  . Dementia Mother 86  . Osteoarthritis Mother   . Cancer Other        skin cancer     Current Outpatient Prescriptions:  .  amLODipine (NORVASC) 5 MG tablet, Take 5 mg by mouth daily. , Disp: , Rfl:  .  aspirin 81 MG tablet, Take 81 mg by mouth daily., Disp: , Rfl:  .  atorvastatin (LIPITOR) 80 MG tablet, Take 80 mg by mouth daily., Disp: , Rfl:  .  calcitRIOL (ROCALTROL) 0.25 MCG capsule, , Disp: , Rfl:  .  cetirizine (ZYRTEC) 10 MG tablet, Take 10 mg by mouth daily., Disp: , Rfl:  .  ezetimibe (ZETIA) 10 MG tablet, Take 10 mg by mouth daily., Disp: , Rfl:  .  FIBER FORMULA PO, Take 2 tablets by mouth 2 (two) times daily. , Disp: , Rfl:  .  gabapentin (NEURONTIN) 300 MG capsule, Take 300 mg by mouth at bedtime., Disp: , Rfl:  .  insulin aspart (NOVOLOG) 100 UNIT/ML injection, Inject 15 Units into the skin 2 (two) times daily. , Disp: , Rfl:  .  Insulin Glargine (LANTUS SOLOSTAR) 100 UNIT/ML Solostar Pen, Inject 65 Units into the skin., Disp: , Rfl:  .  Insulin Pen Needle (FIFTY50 PEN NEEDLES) 32G X 4 MM MISC, USE WITH INJECTIONS 4 TIMES DAILY, Disp: , Rfl:  .  metFORMIN (GLUCOPHAGE) 500 MG tablet, Take by mouth., Disp: , Rfl:  .  metoprolol  (TOPROL-XL) 200 MG 24 hr tablet, Take 200 mg by mouth daily., Disp: , Rfl:  .  Multiple Vitamins-Minerals (MULTIVITAMIN WITH MINERALS) tablet, Take 1 tablet by mouth daily., Disp: , Rfl:  .  pantoprazole (PROTONIX) 20 MG tablet, Take 40 mg by mouth 2 (two) times daily before a meal. , Disp: , Rfl:  .  sildenafil (VIAGRA) 100 MG tablet, Take 100 mg by mouth as needed for erectile dysfunction. Reported on 06/14/2015, Disp: , Rfl:  .  spironolactone (ALDACTONE) 25 MG tablet, Take 25 mg by mouth 2 (two) times daily., Disp: , Rfl:  .  triamcinolone cream (KENALOG) 0.1 %, Apply 1 application topically 2 (two) times daily as needed (rash). , Disp: , Rfl:  .  valsartan-hydrochlorothiazide (DIOVAN-HCT) 320-25 MG per tablet, Take 1 tablet by mouth daily., Disp: , Rfl:   Physical exam:  Vitals:   11/17/16 1131  BP: (!) 156/84  Pulse:  64  Resp: 16  Temp: (!) 96.8 F (36 C)  Weight: 206 lb 1.6 oz (93.5 kg)   Physical Exam  Constitutional: He is oriented to person, place, and time and well-developed, well-nourished, and in no distress.  HENT:  Head: Normocephalic and atraumatic.  Eyes: Pupils are equal, round, and reactive to light. EOM are normal.  Neck: Normal range of motion.  Cardiovascular: Normal rate, regular rhythm and normal heart sounds.   Pulmonary/Chest: Effort normal and breath sounds normal.  Abdominal: Soft. Bowel sounds are normal.  Neurological: He is alert and oriented to person, place, and time.  Skin: Skin is warm and dry.     CMP Latest Ref Rng & Units 06/15/2016  Glucose 65 - 99 mg/dL 137(H)  BUN 6 - 20 mg/dL 24(H)  Creatinine 0.61 - 1.24 mg/dL 1.97(H)  Sodium 135 - 145 mmol/L 134(L)  Potassium 3.5 - 5.1 mmol/L 4.4  Chloride 101 - 111 mmol/L 103  CO2 22 - 32 mmol/L 24  Calcium 8.9 - 10.3 mg/dL 9.2  Total Protein 6.5 - 8.1 g/dL 7.3  Total Bilirubin 0.3 - 1.2 mg/dL 0.5  Alkaline Phos 38 - 126 U/L 59  AST 15 - 41 U/L 22  ALT 17 - 63 U/L 30   CBC Latest Ref Rng & Units  11/17/2016  WBC 3.8 - 10.6 K/uL 10.2  Hemoglobin 13.0 - 18.0 g/dL 12.9(L)  Hematocrit 40.0 - 52.0 % 36.6(L)  Platelets 150 - 440 K/uL 225     Assessment and plan- Patient is a 65 y.o. male with iron deficiency anemia likely secondary to hemorrhoidal bleed  H/H improved after 2 doses of feraheme. Repeat cbc, ferritin and iron studies in 3 and 6 months and I will see him back in 6 months for possible feraheme   Visit Diagnosis 1. Iron deficiency anemia, unspecified iron deficiency anemia type      Dr. Randa Evens, MD, MPH [facility] at [facility] Pager- [phone] 11/17/2016 12:52 PM

## 2016-11-24 DIAGNOSIS — E119 Type 2 diabetes mellitus without complications: Secondary | ICD-10-CM | POA: Diagnosis not present

## 2016-12-06 IMAGING — CR DG CHEST 2V
1 series · 3 of 3 positions shown · non-contrast
Comparison: 03/25/2013

CLINICAL DATA: Admitted to hosp on [REDACTED] for blood loss. Had
tripple bypass surgery 21 years ago, 5 years ago had double stents.
Shielded.

EXAM:
CHEST - 2 VIEW

[Series 1: dg chest 2 view · 0.14mm/px · 3 of 3 slices shown]
[im 1/3]
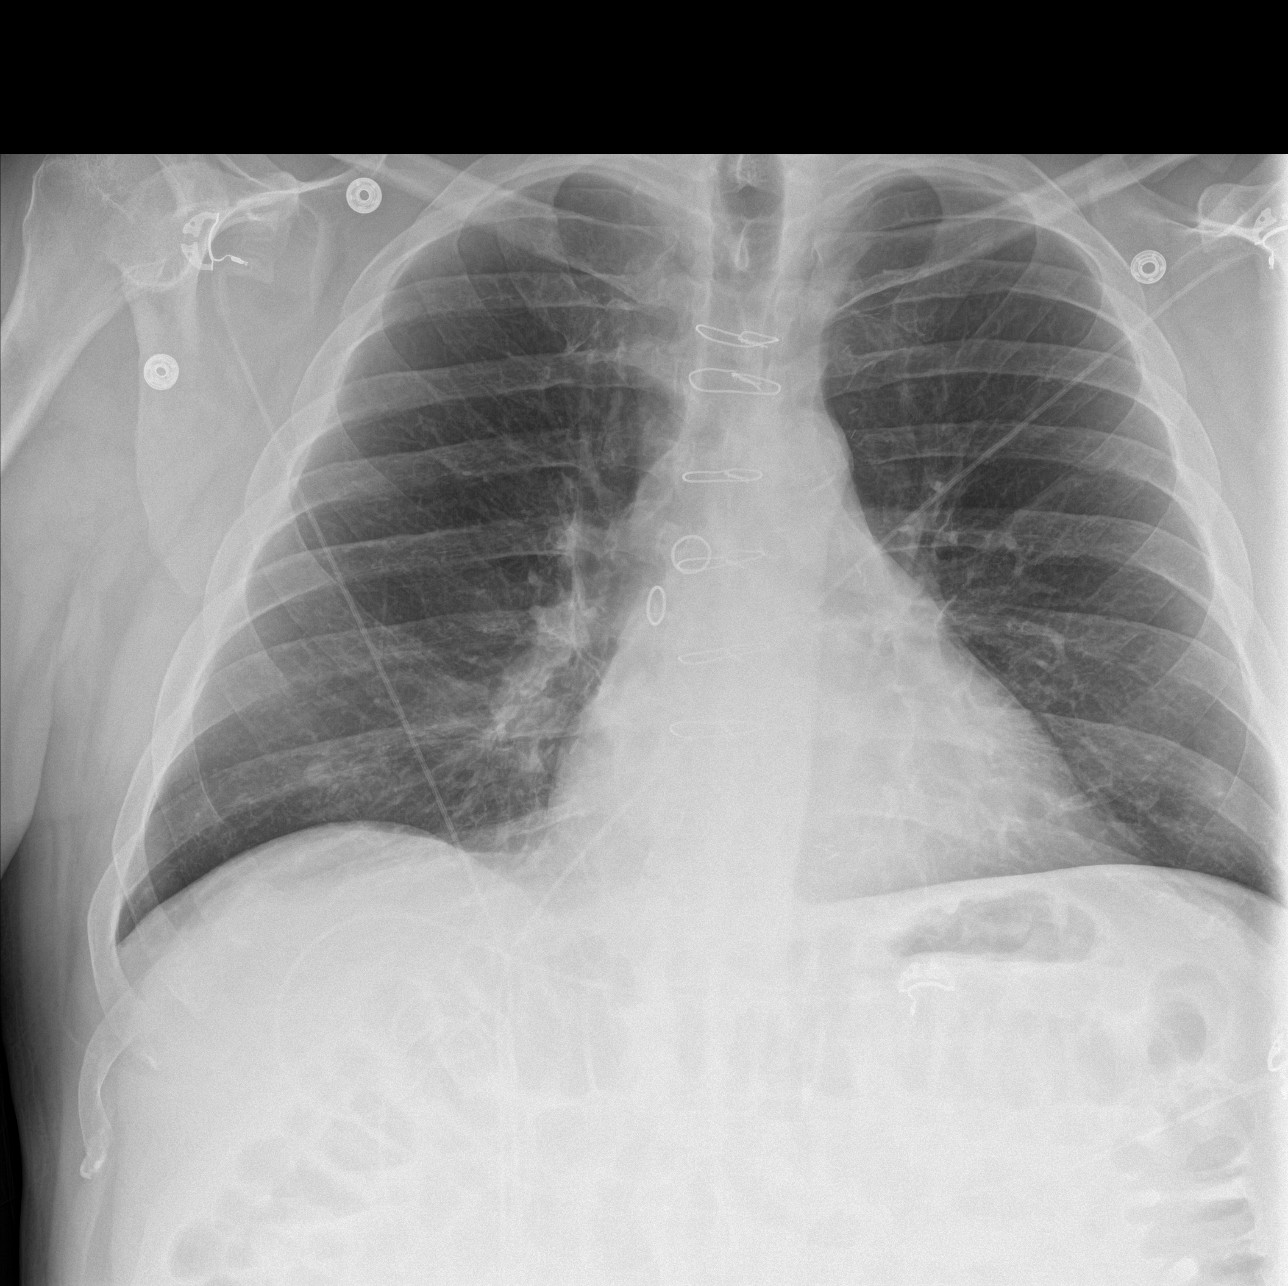
[im 2/3]
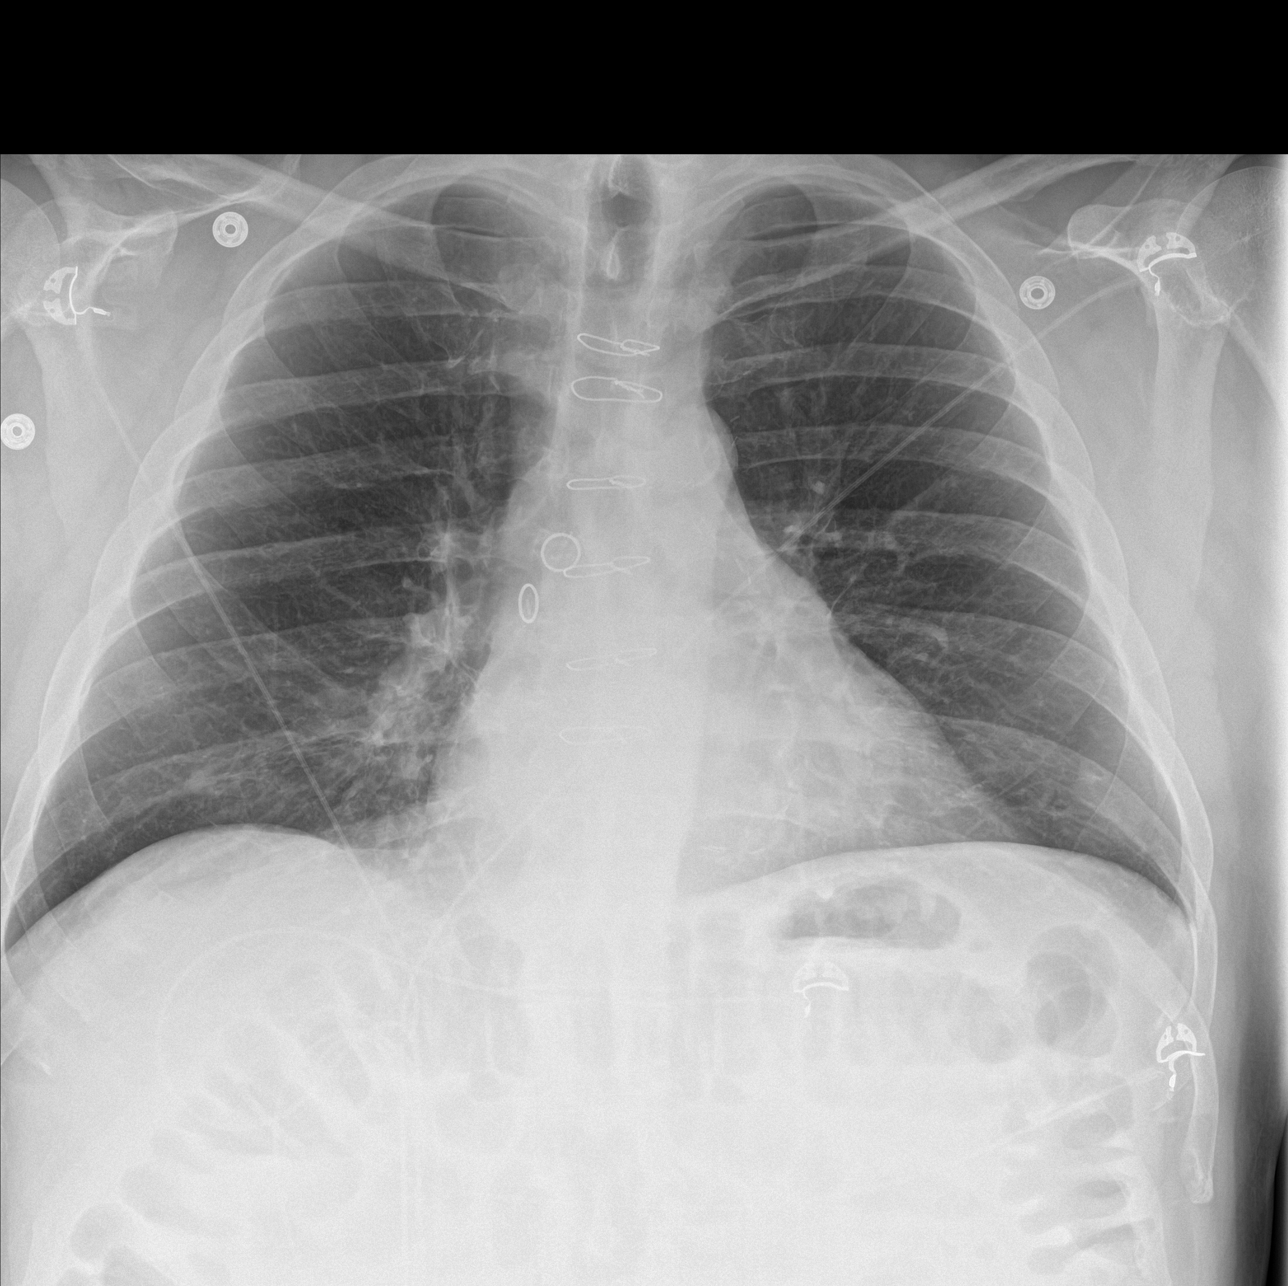
[im 3/3]
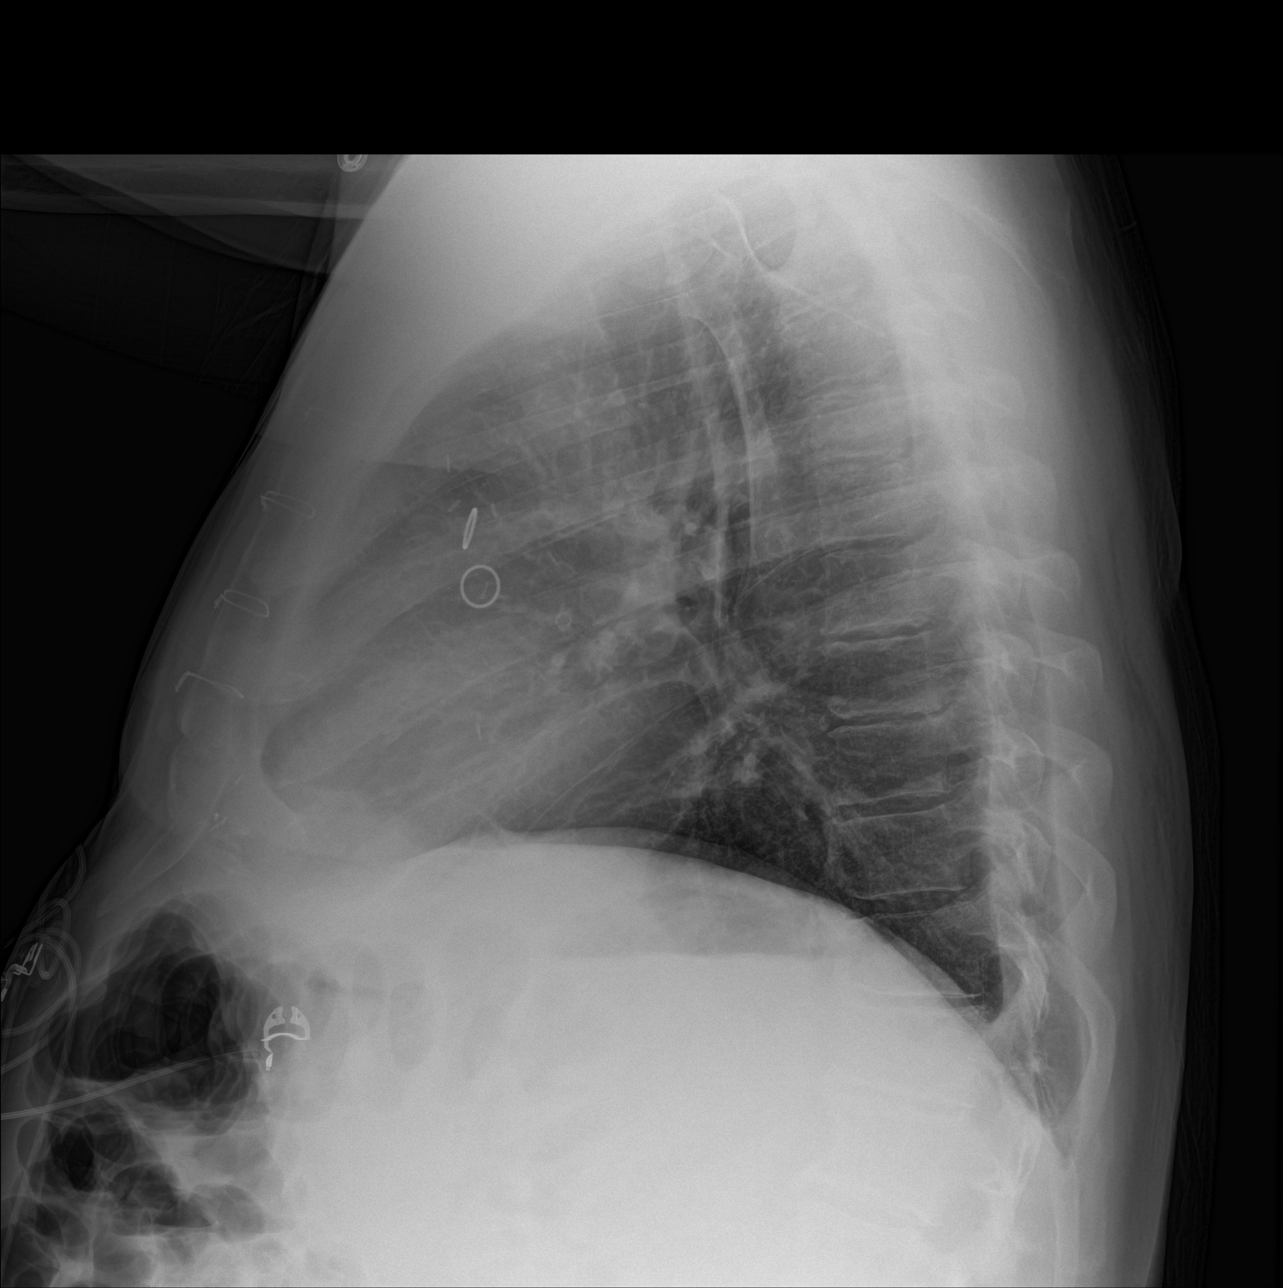

[3 of 3 positions shown; findings below may reference images not displayed]

FINDINGS: Previous CABG. Chronic coarse perihilar interstitial markings. No
confluent airspace disease or overt edema. Heart size normal.
Atheromatous aorta.
No effusion.  No pneumothorax.
Spurring in the lower thoracic spine
IMPRESSION: 1. Stable chronic and postop changes.  No acute disease.

## 2016-12-27 DIAGNOSIS — Z0000 Encounter for general adult medical examination without abnormal findings: Secondary | ICD-10-CM | POA: Diagnosis not present

## 2016-12-27 DIAGNOSIS — E1142 Type 2 diabetes mellitus with diabetic polyneuropathy: Secondary | ICD-10-CM | POA: Diagnosis not present

## 2016-12-27 DIAGNOSIS — I10 Essential (primary) hypertension: Secondary | ICD-10-CM | POA: Diagnosis not present

## 2016-12-27 DIAGNOSIS — E785 Hyperlipidemia, unspecified: Secondary | ICD-10-CM | POA: Diagnosis not present

## 2016-12-27 DIAGNOSIS — D500 Iron deficiency anemia secondary to blood loss (chronic): Secondary | ICD-10-CM | POA: Diagnosis not present

## 2016-12-27 DIAGNOSIS — N183 Chronic kidney disease, stage 3 (moderate): Secondary | ICD-10-CM | POA: Diagnosis not present

## 2016-12-27 DIAGNOSIS — I2510 Atherosclerotic heart disease of native coronary artery without angina pectoris: Secondary | ICD-10-CM | POA: Diagnosis not present

## 2017-01-12 DIAGNOSIS — Z23 Encounter for immunization: Secondary | ICD-10-CM | POA: Diagnosis not present

## 2017-01-19 DIAGNOSIS — E1122 Type 2 diabetes mellitus with diabetic chronic kidney disease: Secondary | ICD-10-CM | POA: Diagnosis not present

## 2017-01-19 DIAGNOSIS — N2581 Secondary hyperparathyroidism of renal origin: Secondary | ICD-10-CM | POA: Diagnosis not present

## 2017-01-19 DIAGNOSIS — N183 Chronic kidney disease, stage 3 (moderate): Secondary | ICD-10-CM | POA: Diagnosis not present

## 2017-01-19 DIAGNOSIS — I10 Essential (primary) hypertension: Secondary | ICD-10-CM | POA: Diagnosis not present

## 2017-01-19 DIAGNOSIS — D631 Anemia in chronic kidney disease: Secondary | ICD-10-CM | POA: Diagnosis not present

## 2017-02-05 DIAGNOSIS — N183 Chronic kidney disease, stage 3 (moderate): Secondary | ICD-10-CM | POA: Diagnosis not present

## 2017-02-05 DIAGNOSIS — Z794 Long term (current) use of insulin: Secondary | ICD-10-CM | POA: Diagnosis not present

## 2017-02-05 DIAGNOSIS — E1159 Type 2 diabetes mellitus with other circulatory complications: Secondary | ICD-10-CM | POA: Diagnosis not present

## 2017-02-05 DIAGNOSIS — E1142 Type 2 diabetes mellitus with diabetic polyneuropathy: Secondary | ICD-10-CM | POA: Diagnosis not present

## 2017-02-05 DIAGNOSIS — E1122 Type 2 diabetes mellitus with diabetic chronic kidney disease: Secondary | ICD-10-CM | POA: Diagnosis not present

## 2017-02-19 ENCOUNTER — Other Ambulatory Visit: Payer: Medicare Other

## 2017-02-21 ENCOUNTER — Inpatient Hospital Stay: Payer: Medicare Other | Attending: Oncology

## 2017-02-21 DIAGNOSIS — D509 Iron deficiency anemia, unspecified: Secondary | ICD-10-CM | POA: Insufficient documentation

## 2017-02-21 LAB — CBC WITH DIFFERENTIAL/PLATELET
Basophils Absolute: 0.1 10*3/uL (ref 0–0.1)
Basophils Relative: 1 %
EOS PCT: 2 %
Eosinophils Absolute: 0.1 10*3/uL (ref 0–0.7)
HCT: 29.9 % — ABNORMAL LOW (ref 40.0–52.0)
Hemoglobin: 10.4 g/dL — ABNORMAL LOW (ref 13.0–18.0)
LYMPHS ABS: 1.4 10*3/uL (ref 1.0–3.6)
LYMPHS PCT: 20 %
MCH: 31.7 pg (ref 26.0–34.0)
MCHC: 34.7 g/dL (ref 32.0–36.0)
MCV: 91.4 fL (ref 80.0–100.0)
MONO ABS: 0.5 10*3/uL (ref 0.2–1.0)
Monocytes Relative: 8 %
Neutro Abs: 4.7 10*3/uL (ref 1.4–6.5)
Neutrophils Relative %: 69 %
PLATELETS: 222 10*3/uL (ref 150–440)
RBC: 3.27 MIL/uL — AB (ref 4.40–5.90)
RDW: 14.6 % — AB (ref 11.5–14.5)
WBC: 6.8 10*3/uL (ref 3.8–10.6)

## 2017-02-21 LAB — IRON AND TIBC
Iron: 76 ug/dL (ref 45–182)
SATURATION RATIOS: 26 % (ref 17.9–39.5)
TIBC: 292 ug/dL (ref 250–450)
UIBC: 216 ug/dL

## 2017-02-21 LAB — FERRITIN: Ferritin: 28 ng/mL (ref 24–336)

## 2017-02-22 ENCOUNTER — Telehealth: Payer: Self-pay | Admitting: *Deleted

## 2017-02-22 ENCOUNTER — Other Ambulatory Visit: Payer: Self-pay | Admitting: Oncology

## 2017-02-22 ENCOUNTER — Telehealth: Payer: Self-pay | Admitting: Oncology

## 2017-02-22 NOTE — Telephone Encounter (Signed)
Called pt and told him about the results of ferritin and Dr. Janese Banks wanted him to get 2 feraheme. He is agreeable to this.  Then I asked him about EGD if he has looked into this.  He states that he is a patient of Dr. Bary Castilla and he did talk to him but he had the last EGD and the only thing they found was hemorrhoids and Dr. Bary Castilla told him that he needs to work on diet- less red meat, stool softners, drinking more water and sitting on toilet limited to no more than 15 min.  They did talk about the hemorrhoids to be removed but Byrnett told him that he could remove them and they can grow back in a few months if he did not try the above and he is doing better and keeping record when he had blood in his stool.  2 days before he came to have blood work he had 2 days of blood in his stool so he knew that it was going to be low. He also at red meat 2 days in a row.  Now his wife will not allow red meat but once ever so many months.I will send a note to schedulers to set it up for wed in afternoon.

## 2017-02-22 NOTE — Telephone Encounter (Signed)
Feraheme weekly x 2, per schd msg/Sherry. Appts conf with patient.

## 2017-02-28 ENCOUNTER — Inpatient Hospital Stay: Payer: Medicare Other | Attending: Oncology

## 2017-02-28 VITALS — BP 150/76 | HR 66 | Resp 20

## 2017-02-28 DIAGNOSIS — D500 Iron deficiency anemia secondary to blood loss (chronic): Secondary | ICD-10-CM

## 2017-02-28 DIAGNOSIS — Z79899 Other long term (current) drug therapy: Secondary | ICD-10-CM | POA: Diagnosis not present

## 2017-02-28 MED ORDER — SODIUM CHLORIDE 0.9 % IV SOLN
510.0000 mg | Freq: Once | INTRAVENOUS | Status: AC
  Administered 2017-02-28: 510 mg via INTRAVENOUS
  Filled 2017-02-28 (×2): qty 17

## 2017-02-28 MED ORDER — SODIUM CHLORIDE 0.9 % IV SOLN
Freq: Once | INTRAVENOUS | Status: AC
Start: 2017-02-28 — End: 2017-02-28
  Administered 2017-02-28: 15:00:00 via INTRAVENOUS
  Filled 2017-02-28: qty 1000

## 2017-03-01 ENCOUNTER — Encounter (INDEPENDENT_AMBULATORY_CARE_PROVIDER_SITE_OTHER): Payer: Self-pay | Admitting: Infectious Diseases

## 2017-03-07 ENCOUNTER — Inpatient Hospital Stay: Payer: Medicare Other

## 2017-03-07 VITALS — BP 168/78 | HR 60 | Temp 97.0°F | Resp 20 | Wt 210.0 lb

## 2017-03-07 DIAGNOSIS — D500 Iron deficiency anemia secondary to blood loss (chronic): Secondary | ICD-10-CM | POA: Diagnosis not present

## 2017-03-07 DIAGNOSIS — Z79899 Other long term (current) drug therapy: Secondary | ICD-10-CM | POA: Diagnosis not present

## 2017-03-07 MED ORDER — FERUMOXYTOL INJECTION 510 MG/17 ML
510.0000 mg | Freq: Once | INTRAVENOUS | Status: AC
  Administered 2017-03-07: 510 mg via INTRAVENOUS
  Filled 2017-03-07: qty 17

## 2017-03-07 MED ORDER — SODIUM CHLORIDE 0.9 % IV SOLN
Freq: Once | INTRAVENOUS | Status: AC
  Administered 2017-03-07: 14:00:00 via INTRAVENOUS
  Filled 2017-03-07: qty 1000

## 2017-03-08 DIAGNOSIS — D229 Melanocytic nevi, unspecified: Secondary | ICD-10-CM | POA: Diagnosis not present

## 2017-03-08 DIAGNOSIS — D225 Melanocytic nevi of trunk: Secondary | ICD-10-CM | POA: Diagnosis not present

## 2017-03-08 DIAGNOSIS — L812 Freckles: Secondary | ICD-10-CM | POA: Diagnosis not present

## 2017-03-08 DIAGNOSIS — L570 Actinic keratosis: Secondary | ICD-10-CM | POA: Diagnosis not present

## 2017-03-08 DIAGNOSIS — L719 Rosacea, unspecified: Secondary | ICD-10-CM | POA: Diagnosis not present

## 2017-03-08 DIAGNOSIS — Z1283 Encounter for screening for malignant neoplasm of skin: Secondary | ICD-10-CM | POA: Diagnosis not present

## 2017-03-08 DIAGNOSIS — D485 Neoplasm of uncertain behavior of skin: Secondary | ICD-10-CM | POA: Diagnosis not present

## 2017-03-08 DIAGNOSIS — D1801 Hemangioma of skin and subcutaneous tissue: Secondary | ICD-10-CM | POA: Diagnosis not present

## 2017-03-08 DIAGNOSIS — L578 Other skin changes due to chronic exposure to nonionizing radiation: Secondary | ICD-10-CM | POA: Diagnosis not present

## 2017-03-08 DIAGNOSIS — Z85828 Personal history of other malignant neoplasm of skin: Secondary | ICD-10-CM | POA: Diagnosis not present

## 2017-03-08 DIAGNOSIS — L821 Other seborrheic keratosis: Secondary | ICD-10-CM | POA: Diagnosis not present

## 2017-03-21 ENCOUNTER — Telehealth: Payer: Self-pay | Admitting: Oncology

## 2017-03-21 NOTE — Telephone Encounter (Signed)
Lab/MD appt rschd, per Provider on PAL.  Appts conf with patient. Appt ltr mailed to patient. L/M on v/m.Marland Kitchen MF

## 2017-03-26 DIAGNOSIS — I200 Unstable angina: Secondary | ICD-10-CM | POA: Diagnosis not present

## 2017-03-26 DIAGNOSIS — I2510 Atherosclerotic heart disease of native coronary artery without angina pectoris: Secondary | ICD-10-CM | POA: Diagnosis not present

## 2017-03-26 DIAGNOSIS — I10 Essential (primary) hypertension: Secondary | ICD-10-CM | POA: Diagnosis not present

## 2017-04-13 DIAGNOSIS — D631 Anemia in chronic kidney disease: Secondary | ICD-10-CM | POA: Diagnosis not present

## 2017-04-13 DIAGNOSIS — N183 Chronic kidney disease, stage 3 (moderate): Secondary | ICD-10-CM | POA: Diagnosis not present

## 2017-04-13 DIAGNOSIS — I10 Essential (primary) hypertension: Secondary | ICD-10-CM | POA: Diagnosis not present

## 2017-04-13 DIAGNOSIS — E1122 Type 2 diabetes mellitus with diabetic chronic kidney disease: Secondary | ICD-10-CM | POA: Diagnosis not present

## 2017-04-13 DIAGNOSIS — N2581 Secondary hyperparathyroidism of renal origin: Secondary | ICD-10-CM | POA: Diagnosis not present

## 2017-05-25 ENCOUNTER — Ambulatory Visit: Payer: Medicare Other | Admitting: Oncology

## 2017-05-25 ENCOUNTER — Other Ambulatory Visit: Payer: Medicare Other

## 2017-06-01 ENCOUNTER — Encounter: Payer: Self-pay | Admitting: Oncology

## 2017-06-01 ENCOUNTER — Inpatient Hospital Stay: Payer: Medicare Other | Attending: Oncology

## 2017-06-01 ENCOUNTER — Inpatient Hospital Stay (HOSPITAL_BASED_OUTPATIENT_CLINIC_OR_DEPARTMENT_OTHER): Payer: Medicare Other | Admitting: Oncology

## 2017-06-01 VITALS — BP 143/80 | HR 67 | Temp 97.7°F | Resp 14 | Wt 205.0 lb

## 2017-06-01 DIAGNOSIS — Z808 Family history of malignant neoplasm of other organs or systems: Secondary | ICD-10-CM | POA: Insufficient documentation

## 2017-06-01 DIAGNOSIS — D509 Iron deficiency anemia, unspecified: Secondary | ICD-10-CM | POA: Diagnosis not present

## 2017-06-01 DIAGNOSIS — I200 Unstable angina: Secondary | ICD-10-CM

## 2017-06-01 DIAGNOSIS — N183 Chronic kidney disease, stage 3 (moderate): Secondary | ICD-10-CM | POA: Insufficient documentation

## 2017-06-01 DIAGNOSIS — Z79899 Other long term (current) drug therapy: Secondary | ICD-10-CM | POA: Insufficient documentation

## 2017-06-01 DIAGNOSIS — K649 Unspecified hemorrhoids: Secondary | ICD-10-CM | POA: Insufficient documentation

## 2017-06-01 DIAGNOSIS — E1122 Type 2 diabetes mellitus with diabetic chronic kidney disease: Secondary | ICD-10-CM | POA: Insufficient documentation

## 2017-06-01 DIAGNOSIS — Z87891 Personal history of nicotine dependence: Secondary | ICD-10-CM | POA: Diagnosis not present

## 2017-06-01 DIAGNOSIS — K219 Gastro-esophageal reflux disease without esophagitis: Secondary | ICD-10-CM | POA: Insufficient documentation

## 2017-06-01 DIAGNOSIS — I129 Hypertensive chronic kidney disease with stage 1 through stage 4 chronic kidney disease, or unspecified chronic kidney disease: Secondary | ICD-10-CM | POA: Insufficient documentation

## 2017-06-01 DIAGNOSIS — E213 Hyperparathyroidism, unspecified: Secondary | ICD-10-CM | POA: Diagnosis not present

## 2017-06-01 DIAGNOSIS — E785 Hyperlipidemia, unspecified: Secondary | ICD-10-CM | POA: Diagnosis not present

## 2017-06-01 DIAGNOSIS — Z7982 Long term (current) use of aspirin: Secondary | ICD-10-CM

## 2017-06-01 DIAGNOSIS — Z85828 Personal history of other malignant neoplasm of skin: Secondary | ICD-10-CM | POA: Diagnosis not present

## 2017-06-01 DIAGNOSIS — Z794 Long term (current) use of insulin: Secondary | ICD-10-CM | POA: Insufficient documentation

## 2017-06-01 LAB — FERRITIN: FERRITIN: 67 ng/mL (ref 24–336)

## 2017-06-01 LAB — CBC WITH DIFFERENTIAL/PLATELET
BASOS ABS: 0.1 10*3/uL (ref 0–0.1)
BASOS PCT: 1 %
Eosinophils Absolute: 0.1 10*3/uL (ref 0–0.7)
Eosinophils Relative: 1 %
HEMATOCRIT: 37.1 % — AB (ref 40.0–52.0)
HEMOGLOBIN: 12.6 g/dL — AB (ref 13.0–18.0)
Lymphocytes Relative: 16 %
Lymphs Abs: 1.4 10*3/uL (ref 1.0–3.6)
MCH: 30.7 pg (ref 26.0–34.0)
MCHC: 34.1 g/dL (ref 32.0–36.0)
MCV: 90 fL (ref 80.0–100.0)
MONOS PCT: 8 %
Monocytes Absolute: 0.7 10*3/uL (ref 0.2–1.0)
NEUTROS ABS: 6.5 10*3/uL (ref 1.4–6.5)
NEUTROS PCT: 74 %
Platelets: 249 10*3/uL (ref 150–440)
RBC: 4.12 MIL/uL — ABNORMAL LOW (ref 4.40–5.90)
RDW: 13.2 % (ref 11.5–14.5)
WBC: 8.8 10*3/uL (ref 3.8–10.6)

## 2017-06-01 LAB — IRON AND TIBC
Iron: 57 ug/dL (ref 45–182)
Saturation Ratios: 23 % (ref 17.9–39.5)
TIBC: 250 ug/dL (ref 250–450)
UIBC: 193 ug/dL

## 2017-06-04 NOTE — Progress Notes (Signed)
Hematology/Oncology Consult note [facility]  [phone] [phone]  Patient Care Team: Leonel Ramsay, MD as PCP - General (Infectious Diseases) Bary Castilla, Forest Gleason, MD (General Surgery) Leonel Ramsay, MD (Infectious Diseases) Anthonette Legato, MD (Internal Medicine)   Name of the patient: Roger Knight  [id number]  18-Nov-1950   Date of visit: 06/04/17  Diagnosis- iron deficiency anemia possibly secondary to hemorrhoidal bleeding  Chief complaint/ Reason for visit- routine f/u of iron deficiency anemia  Heme/Onc history:Patient is a 67 year old male with a past medical history significant for hypertension, hyperparathyroidism and staged chronic kidney disease. He has been referred to [place] for evaluation and management of anemia. Most recent CBC from 05/19/2016 showed white count of 7.9, H&H of 9.4/29 with an MCV of 78 and a platelet count of 33. Prior CBC from May 2017 revealed H&H of 12/35.8. Patient also had a recent CBC on 06/12/2016 which showed an H&H of 8.1/26.3 with an MCV of 79.2. Ferritin from October 2017 was low normal at 33.Patient does have a history of hemorrhoids and reports on and off rectal bleeding. Denies any frequent use of NSAIDs. He has had a complete GI evaluation in the past including capsule endoscopy which did not reveal any source of bleeding. Patient has been hospitalized a couple of times and a past for iron deficiency anemia requiring blood transfusion.Patient had an EGD colonoscopy in January 2017. EGD showed a nonbleeding gastric polyp which was biopsied and colonoscopy showed internal and external hemorrhoids. The cause of his GI bleed in the past has been attributed to his hemorrhoids. Patient was on oral iron for about a year or so but stopped taking it in summer of 2017. Currently patient feels well and denies any symptoms of fever, shortness of breath on exertion or chest pain. Patient also has  chronic kidney disease but has not received any EPO shots in the past  Patient has been needing intermittent doses of Feraheme.  Last dose was in November 2018   Interval history-patient currently reports feeling well.  Denies any fatigue.  Denies any consistent use of NSAIDs.  He has not had any severe hemorrhoidal bleeding in the last 3 months.  He continues to follow-up with Dr. Bary Castilla for the same.  ECOG PS- 0 Pain scale- 0   Review of systems- Review of Systems  Constitutional: Negative for chills, fever, malaise/fatigue and weight loss.  HENT: Negative for congestion, ear discharge and nosebleeds.   Eyes: Negative for blurred vision.  Respiratory: Negative for cough, hemoptysis, sputum production, shortness of breath and wheezing.   Cardiovascular: Negative for chest pain, palpitations, orthopnea and claudication.  Gastrointestinal: Negative for abdominal pain, blood in stool, constipation, diarrhea, heartburn, melena, nausea and vomiting.  Genitourinary: Negative for dysuria, flank pain, frequency, hematuria and urgency.  Musculoskeletal: Negative for back pain, joint pain and myalgias.  Skin: Negative for rash.  Neurological: Negative for dizziness, tingling, focal weakness, seizures, weakness and headaches.  Endo/Heme/Allergies: Does not bruise/bleed easily.  Psychiatric/Behavioral: Negative for depression and suicidal ideas. The patient does not have insomnia.      No Known Allergies   Past Medical History:  Diagnosis Date  . Accelerating angina ([place]) 02/11/2014  . Anemia   . Arthritis   . Carcinoma ([place]) 2012    skin cancer on neck [facility]  . Chronic kidney disease 2016  . Diabetes mellitus without complication ([place])   . GERD (gastroesophageal reflux disease)   . Hemorrhoids 2016-17  . Hernia   .  Hyperlipidemia   . Hypertension   . Skin cancer      Past Surgical History:  Procedure Laterality Date  . COLONOSCOPY  April 2014  . COLONOSCOPY N/A 05/13/2015    Procedure: COLONOSCOPY;  Surgeon: Josefine Class, MD;  Location[facility] END[facility]COPY;  Service: Endoscopy;  Laterality: N/A;  . CORONARY ARTERY BYPASS GRAFT  1996   [facility]  . CORONARY STENT PLACEMENT  2011   [facility]  . ESOPHAGOGASTRODUODENOSCOPY (EGD) WITH PROPOFOL N/A 05/13/2015   Procedure: ESOPHAGOGASTRODUODENOSCOPY (EGD) WITH PROPOFOL;  Surgeon: Josefine Class, MD;  Location: [facility] ENDOSCOPY;  Service: Endoscopy;  Laterality: N/A;  . UPPER GI ENDOSCOPY  2014    Social History   Socioeconomic History  . Marital status: Married    Spouse name: Not on file  . Number of children: Not on file  . Years of education: Not on file  . Highest education level: Not on file  Social Needs  . Financial resource strain: Not on file  . Food insecurity - worry: Not on file  . Food insecurity - inability: Not on file  . Transportation needs - medical: Not on file  . Transportation needs - non-medical: Not on file  Occupational History  . Not on file  Tobacco Use  . Smoking status: Former Smoker    Types: Cigarettes    Last attempt to quit: 04/24/2005    Years since quitting: 12.1  . Smokeless tobacco: Former Systems developer    Types: Chew  Substance and Sexual Activity  . Alcohol use: No  . Drug use: No  . Sexual activity: Yes  Other Topics Concern  . Not on file  Social History Narrative  . Not on file    Family History  Problem Relation Age of Onset  . Dementia Mother 23  . Osteoarthritis Mother   . Cancer Other        skin cancer     Current Outpatient Medications:  .  amLODipine (NORVASC) 5 MG tablet, Take 5 mg by mouth daily. , Disp: , Rfl:  .  aspirin 81 MG tablet, Take 81 mg by mouth daily., Disp: , Rfl:  .  atorvastatin (LIPITOR) 80 MG tablet, Take 80 mg by mouth daily., Disp: , Rfl:  .  calcitRIOL (ROCALTROL) 0.25 MCG capsule, , Disp: , Rfl:  .  cetirizine (ZYRTEC) 10 MG tablet, Take 10 mg by mouth daily., Disp: , Rfl:  .  ezetimibe (ZETIA) 10 MG tablet, Take 10 mg by mouth  daily., Disp: , Rfl:  .  FIBER FORMULA PO, Take 2 tablets by mouth 2 (two) times daily. , Disp: , Rfl:  .  gabapentin (NEURONTIN) 300 MG capsule, Take 300 mg by mouth at bedtime., Disp: , Rfl:  .  insulin aspart (NOVOLOG) 100 UNIT/ML injection, Inject 20 Units into the skin 3 (three) times daily. , Disp: , Rfl:  .  Insulin Glargine (LANTUS SOLOSTAR) 100 UNIT/ML Solostar Pen, Inject 70 Units into the skin daily. , Disp: , Rfl:  .  Insulin Pen Needle (FIFTY50 PEN NEEDLES) 32G X 4 MM MISC, USE WITH INJECTIONS 4 TIMES DAILY, Disp: , Rfl:  .  metFORMIN (GLUCOPHAGE) 500 MG tablet, Take by mouth., Disp: , Rfl:  .  metoprolol (TOPROL-XL) 200 MG 24 hr tablet, Take 200 mg by mouth daily., Disp: , Rfl:  .  Multiple Vitamins-Minerals (MULTIVITAMIN WITH MINERALS) tablet, Take 1 tablet by mouth daily., Disp: , Rfl:  .  pantoprazole (PROTONIX) 20 MG tablet, Take 40 mg by mouth  2 (two) times daily before a meal. , Disp: , Rfl:  .  sildenafil (VIAGRA) 100 MG tablet, Take 100 mg by mouth as needed for erectile dysfunction. Reported on 06/14/2015, Disp: , Rfl:  .  spironolactone (ALDACTONE) 25 MG tablet, Take 25 mg by mouth 2 (two) times daily., Disp: , Rfl:  .  triamcinolone cream (KENALOG) 0.1 %, Apply 1 application topically 2 (two) times daily as needed (rash). , Disp: , Rfl:  .  valsartan-hydrochlorothiazide (DIOVAN-HCT) 320-25 MG per tablet, Take 1 tablet by mouth daily., Disp: , Rfl:   Physical exam:  Vitals:   06/01/17 1139  BP: (!) 143/80  Pulse: 67  Resp: 14  Temp: 97.7 F (36.5 C)  TempSrc: Tympanic  Weight: 205 lb (93 kg)   Physical Exam  Constitutional: He is oriented to person, place, and time and well-developed, well-nourished, and in no distress.  HENT:  Head: Normocephalic and atraumatic.  Eyes: EOM are normal. Pupils are equal, round, and reactive to light.  Neck: Normal range of motion.  Cardiovascular: Normal rate, regular rhythm and normal heart sounds.  Pulmonary/Chest: Effort  normal and breath sounds normal.  Abdominal: Soft. Bowel sounds are normal.  Neurological: He is alert and oriented to person, place, and time.  Skin: Skin is warm and dry.     CMP Latest Ref Rng & Units 06/15/2016  Glucose 65 - 99 mg/dL 137(H)  BUN 6 - 20 mg/dL 24(H)  Creatinine 0.61 - 1.24 mg/dL 1.97(H)  Sodium 135 - 145 mmol/L 134(L)  Potassium 3.5 - 5.1 mmol/L 4.4  Chloride 101 - 111 mmol/L 103  CO2 22 - 32 mmol/L 24  Calcium 8.9 - 10.3 mg/dL 9.2  Total Protein 6.5 - 8.1 g/dL 7.3  Total Bilirubin 0.3 - 1.2 mg/dL 0.5  Alkaline Phos 38 - 126 U/L 59  AST 15 - 41 U/L 22  ALT 17 - 63 U/L 30   CBC Latest Ref Rng & Units 06/01/2017  WBC 3.8 - 10.6 K/uL 8.8  Hemoglobin 13.0 - 18.0 g/dL 12.6(L)  Hematocrit 40.0 - 52.0 % 37.1(L)  Platelets 150 - 440 K/uL 249    No images are attached to the encounter.  No results found.   Assessment and plan- Patient is a 67 y.o. male with iron deficiency anemia likely secondary to hemorrhoidal bleeding  Patient has had EGD colonoscopy and capsule endoscopy back in 2017 which did not reveal any evidence of bleeding.  However patient continues to be iron deficient intermittently requiring Feraheme.  He states that his hemorrhoids do tend to bleed quite significantly and he follows up with Dr. Bary Castilla for the same.  Currently he is trying dietary modifications to prevent constipation that could exacerbate his hemorrhoids.  I did discuss that if he continues to have iron deficiency anemia he should consider speaking to [facility] again.  Patient understands that EGD and colonoscopy can sometimes miss polyps that may warrant reevaluation if there is ongoing iron deficiency.  He would like to hold off at this time  Today's H&H is normal and his iron studies do not warrant any IV iron at this time.  Repeat CBC ferritin iron studies in 3 and 6 months and I will see him back in 6 months   Visit Diagnosis 1. Iron deficiency anemia, unspecified iron  deficiency anemia type      Dr. Randa Evens, MD, MPH [facility] at [facility] Pager- [phone] 06/04/2017 1:02 PM

## 2017-06-19 DIAGNOSIS — N183 Chronic kidney disease, stage 3 (moderate): Secondary | ICD-10-CM | POA: Diagnosis not present

## 2017-06-19 DIAGNOSIS — Z794 Long term (current) use of insulin: Secondary | ICD-10-CM | POA: Diagnosis not present

## 2017-06-19 DIAGNOSIS — E1122 Type 2 diabetes mellitus with diabetic chronic kidney disease: Secondary | ICD-10-CM | POA: Diagnosis not present

## 2017-06-26 DIAGNOSIS — E1165 Type 2 diabetes mellitus with hyperglycemia: Secondary | ICD-10-CM | POA: Diagnosis not present

## 2017-06-26 DIAGNOSIS — E1142 Type 2 diabetes mellitus with diabetic polyneuropathy: Secondary | ICD-10-CM | POA: Diagnosis not present

## 2017-06-26 DIAGNOSIS — E1159 Type 2 diabetes mellitus with other circulatory complications: Secondary | ICD-10-CM | POA: Diagnosis not present

## 2017-07-09 DIAGNOSIS — J069 Acute upper respiratory infection, unspecified: Secondary | ICD-10-CM | POA: Diagnosis not present

## 2017-08-15 DIAGNOSIS — N184 Chronic kidney disease, stage 4 (severe): Secondary | ICD-10-CM | POA: Diagnosis not present

## 2017-08-15 DIAGNOSIS — D631 Anemia in chronic kidney disease: Secondary | ICD-10-CM | POA: Diagnosis not present

## 2017-08-15 DIAGNOSIS — N2581 Secondary hyperparathyroidism of renal origin: Secondary | ICD-10-CM | POA: Diagnosis not present

## 2017-08-15 DIAGNOSIS — E1122 Type 2 diabetes mellitus with diabetic chronic kidney disease: Secondary | ICD-10-CM | POA: Diagnosis not present

## 2017-08-15 DIAGNOSIS — I10 Essential (primary) hypertension: Secondary | ICD-10-CM | POA: Diagnosis not present

## 2017-08-30 ENCOUNTER — Inpatient Hospital Stay: Payer: Medicare Other | Attending: Oncology

## 2017-08-30 ENCOUNTER — Other Ambulatory Visit: Payer: Self-pay | Admitting: Oncology

## 2017-08-30 ENCOUNTER — Telehealth: Payer: Self-pay

## 2017-08-30 DIAGNOSIS — D509 Iron deficiency anemia, unspecified: Secondary | ICD-10-CM | POA: Diagnosis not present

## 2017-08-30 LAB — CBC
HEMATOCRIT: 31.4 % — AB (ref 40.0–52.0)
HEMOGLOBIN: 11.1 g/dL — AB (ref 13.0–18.0)
MCH: 31.8 pg (ref 26.0–34.0)
MCHC: 35.3 g/dL (ref 32.0–36.0)
MCV: 90.1 fL (ref 80.0–100.0)
Platelets: 244 10*3/uL (ref 150–440)
RBC: 3.49 MIL/uL — ABNORMAL LOW (ref 4.40–5.90)
RDW: 14.7 % — AB (ref 11.5–14.5)
WBC: 7.7 10*3/uL (ref 3.8–10.6)

## 2017-08-30 LAB — IRON AND TIBC
IRON: 90 ug/dL (ref 45–182)
Saturation Ratios: 29 % (ref 17.9–39.5)
TIBC: 306 ug/dL (ref 250–450)
UIBC: 216 ug/dL

## 2017-08-30 LAB — FERRITIN: FERRITIN: 20 ng/mL — AB (ref 24–336)

## 2017-08-30 NOTE — Telephone Encounter (Addendum)
Per Dr. Janese Banks Mr Roger Knight ferritin is low and he is a little more anemic than before. I have contact the patient to inform him and have him schedule for feraheme for 08/30/17 at 10:00 AM. The patient was understanding and agreeable.

## 2017-08-30 NOTE — Telephone Encounter (Signed)
-----   Message from Sindy Guadeloupe, MD sent at 08/30/2017  4:04 PM EDT ----- Patient is a little more anemic and ferritin is low. I will put in for feraheme. Please let patient know  Verdis Frederickson- can you please schedule?

## 2017-08-31 ENCOUNTER — Inpatient Hospital Stay: Payer: Medicare Other

## 2017-08-31 DIAGNOSIS — D500 Iron deficiency anemia secondary to blood loss (chronic): Secondary | ICD-10-CM

## 2017-08-31 DIAGNOSIS — D509 Iron deficiency anemia, unspecified: Secondary | ICD-10-CM | POA: Diagnosis not present

## 2017-08-31 MED ORDER — SODIUM CHLORIDE 0.9 % IV SOLN
510.0000 mg | Freq: Once | INTRAVENOUS | Status: AC
  Administered 2017-08-31: 510 mg via INTRAVENOUS
  Filled 2017-08-31: qty 17

## 2017-08-31 MED ORDER — SODIUM CHLORIDE 0.9 % IV SOLN
Freq: Once | INTRAVENOUS | Status: AC
  Administered 2017-08-31: 10:00:00 via INTRAVENOUS
  Filled 2017-08-31: qty 1000

## 2017-09-03 ENCOUNTER — Encounter (INDEPENDENT_AMBULATORY_CARE_PROVIDER_SITE_OTHER): Payer: Self-pay | Admitting: Infectious Diseases

## 2017-09-25 DIAGNOSIS — I10 Essential (primary) hypertension: Secondary | ICD-10-CM | POA: Diagnosis not present

## 2017-09-25 DIAGNOSIS — I200 Unstable angina: Secondary | ICD-10-CM | POA: Diagnosis not present

## 2017-09-25 DIAGNOSIS — I2510 Atherosclerotic heart disease of native coronary artery without angina pectoris: Secondary | ICD-10-CM | POA: Diagnosis not present

## 2017-09-25 DIAGNOSIS — E1142 Type 2 diabetes mellitus with diabetic polyneuropathy: Secondary | ICD-10-CM | POA: Diagnosis not present

## 2017-10-02 DIAGNOSIS — E1122 Type 2 diabetes mellitus with diabetic chronic kidney disease: Secondary | ICD-10-CM | POA: Diagnosis not present

## 2017-10-02 DIAGNOSIS — E1142 Type 2 diabetes mellitus with diabetic polyneuropathy: Secondary | ICD-10-CM | POA: Diagnosis not present

## 2017-10-02 DIAGNOSIS — E1159 Type 2 diabetes mellitus with other circulatory complications: Secondary | ICD-10-CM | POA: Diagnosis not present

## 2017-10-02 DIAGNOSIS — Z794 Long term (current) use of insulin: Secondary | ICD-10-CM | POA: Diagnosis not present

## 2017-10-02 DIAGNOSIS — N184 Chronic kidney disease, stage 4 (severe): Secondary | ICD-10-CM | POA: Diagnosis not present

## 2017-11-29 ENCOUNTER — Telehealth: Payer: Self-pay

## 2017-11-29 ENCOUNTER — Inpatient Hospital Stay (HOSPITAL_BASED_OUTPATIENT_CLINIC_OR_DEPARTMENT_OTHER): Payer: Medicare Other | Admitting: Oncology

## 2017-11-29 ENCOUNTER — Encounter: Payer: Self-pay | Admitting: Oncology

## 2017-11-29 ENCOUNTER — Inpatient Hospital Stay: Payer: Medicare Other | Attending: Oncology

## 2017-11-29 VITALS — BP 148/86 | HR 59 | Temp 97.3°F | Resp 18 | Ht 67.0 in | Wt 206.3 lb

## 2017-11-29 DIAGNOSIS — K649 Unspecified hemorrhoids: Secondary | ICD-10-CM

## 2017-11-29 DIAGNOSIS — I129 Hypertensive chronic kidney disease with stage 1 through stage 4 chronic kidney disease, or unspecified chronic kidney disease: Secondary | ICD-10-CM | POA: Diagnosis not present

## 2017-11-29 DIAGNOSIS — Z79899 Other long term (current) drug therapy: Secondary | ICD-10-CM | POA: Insufficient documentation

## 2017-11-29 DIAGNOSIS — K219 Gastro-esophageal reflux disease without esophagitis: Secondary | ICD-10-CM | POA: Diagnosis not present

## 2017-11-29 DIAGNOSIS — Z87891 Personal history of nicotine dependence: Secondary | ICD-10-CM | POA: Diagnosis not present

## 2017-11-29 DIAGNOSIS — D509 Iron deficiency anemia, unspecified: Secondary | ICD-10-CM

## 2017-11-29 DIAGNOSIS — Z7982 Long term (current) use of aspirin: Secondary | ICD-10-CM

## 2017-11-29 DIAGNOSIS — F0390 Unspecified dementia without behavioral disturbance: Secondary | ICD-10-CM

## 2017-11-29 DIAGNOSIS — E1122 Type 2 diabetes mellitus with diabetic chronic kidney disease: Secondary | ICD-10-CM | POA: Diagnosis not present

## 2017-11-29 DIAGNOSIS — E785 Hyperlipidemia, unspecified: Secondary | ICD-10-CM

## 2017-11-29 DIAGNOSIS — Z86010 Personal history of colonic polyps: Secondary | ICD-10-CM | POA: Insufficient documentation

## 2017-11-29 DIAGNOSIS — Z794 Long term (current) use of insulin: Secondary | ICD-10-CM | POA: Insufficient documentation

## 2017-11-29 DIAGNOSIS — N189 Chronic kidney disease, unspecified: Secondary | ICD-10-CM | POA: Insufficient documentation

## 2017-11-29 DIAGNOSIS — Z85828 Personal history of other malignant neoplasm of skin: Secondary | ICD-10-CM

## 2017-11-29 DIAGNOSIS — M1990 Unspecified osteoarthritis, unspecified site: Secondary | ICD-10-CM | POA: Diagnosis not present

## 2017-11-29 LAB — CBC
HCT: 33.5 % — ABNORMAL LOW (ref 40.0–52.0)
Hemoglobin: 11.4 g/dL — ABNORMAL LOW (ref 13.0–18.0)
MCH: 30.6 pg (ref 26.0–34.0)
MCHC: 34 g/dL (ref 32.0–36.0)
MCV: 90 fL (ref 80.0–100.0)
PLATELETS: 254 10*3/uL (ref 150–440)
RBC: 3.72 MIL/uL — ABNORMAL LOW (ref 4.40–5.90)
RDW: 13.4 % (ref 11.5–14.5)
WBC: 8.1 10*3/uL (ref 3.8–10.6)

## 2017-11-29 LAB — IRON AND TIBC
IRON: 82 ug/dL (ref 45–182)
Saturation Ratios: 28 % (ref 17.9–39.5)
TIBC: 289 ug/dL (ref 250–450)
UIBC: 207 ug/dL

## 2017-11-29 LAB — FERRITIN: Ferritin: 37 ng/mL (ref 24–336)

## 2017-11-29 NOTE — Telephone Encounter (Signed)
-----   Message from Sindy Guadeloupe, MD sent at 11/29/2017  3:22 PM EDT ----- Hold off on feraheme at this time

## 2017-11-29 NOTE — Telephone Encounter (Signed)
spoke with the patient to inform him at this time per Dr Janese Banks he don't need Feraheme. The patient levels were 82 with labs . The patient was understanding.

## 2017-11-29 NOTE — Progress Notes (Signed)
No new changes noted today 

## 2017-11-30 NOTE — Progress Notes (Signed)
Hematology/Oncology Consult note [facility]  Telephone:([phone] [phone]  Patient Care Team: Leonel Ramsay, MD as PCP - General (Infectious Diseases) Bary Castilla, Forest Gleason, MD (General Surgery) Leonel Ramsay, MD (Infectious Diseases) Anthonette Legato, MD (Internal Medicine)   Name of the patient: Roger Knight  [id number]  November 08, 1950   Date of visit: 11/30/17  Diagnosis- iron deficiency anemia possibly secondary to hemorrhoidal bleeding  Chief complaint/ Reason for visit-routine follow-up visit of iron deficiency anemia  Heme/Onc history: Patient is a 67 year old male with a past medical history significant for hypertension, hyperparathyroidism and staged chronic kidney disease. He has been referred to [place] for evaluation and management of anemia. Most recent CBC from 05/19/2016 showed white count of 7.9, H&H of 9.4/29 with an MCV of 78 and a platelet count of 33. Prior CBC from May 2017 revealed H&H of 12/35.8. Patient also had a recent CBC on 06/12/2016 which showed an H&H of 8.1/26.3 with an MCV of 79.2. Ferritin from October 2017 was low normal at 33.Patient does have a history of hemorrhoids and reports on and off rectal bleeding. Denies any frequent use of NSAIDs. He has had a complete GI evaluation in the past including capsule endoscopy which did not reveal any source of bleeding. Patient has been hospitalized a couple of times and a past for iron deficiency anemia requiring blood transfusion.Patient had an EGD colonoscopy in January 2017. EGD showed a nonbleeding gastric polyp which was biopsied and colonoscopy showed internal and external hemorrhoids. The cause of his GI bleed in the past has been attributed to his hemorrhoids. Patient was on oral iron for about a year or so but stopped taking it in summer of 2017. Currently patient feels well and denies any symptoms of fever, shortness of breath on exertion or chest pain. Patient  also has chronic kidney disease but has not received any EPO shots in the past  Patient has been needing intermittent doses of Feraheme.  Interval history-he feels well and denies any complaints today.  He has not had any blood in his stool or dark melanotic stools recently.  ECOG PS- 0 Pain scale- 0 Opioid associated constipation- no  Review of systems- Review of Systems  Constitutional: Negative for chills, fever, malaise/fatigue and weight loss.  HENT: Negative for congestion, ear discharge and nosebleeds.   Eyes: Negative for blurred vision.  Respiratory: Negative for cough, hemoptysis, sputum production, shortness of breath and wheezing.   Cardiovascular: Negative for chest pain, palpitations, orthopnea and claudication.  Gastrointestinal: Negative for abdominal pain, blood in stool, constipation, diarrhea, heartburn, melena, nausea and vomiting.  Genitourinary: Negative for dysuria, flank pain, frequency, hematuria and urgency.  Musculoskeletal: Negative for back pain, joint pain and myalgias.  Skin: Negative for rash.  Neurological: Negative for dizziness, tingling, focal weakness, seizures, weakness and headaches.  Endo/Heme/Allergies: Does not bruise/bleed easily.  Psychiatric/Behavioral: Negative for depression and suicidal ideas. The patient does not have insomnia.       No Known Allergies   Past Medical History:  Diagnosis Date  . Accelerating angina ([place]) 02/11/2014  . Anemia   . Arthritis   . Carcinoma ([place]) 2012    skin cancer on neck [facility]  . Chronic kidney disease 2016  . Diabetes mellitus without complication ([place])   . GERD (gastroesophageal reflux disease)   . Hemorrhoids 2016-17  . Hernia   . Hyperlipidemia   . Hypertension   . Skin cancer      Past Surgical History:  Procedure Laterality Date  . COLONOSCOPY  April 2014  . COLONOSCOPY N/A 05/13/2015   Procedure: COLONOSCOPY;  Surgeon: Josefine Class, MD;  Location: [facility] ENDOSCOPY;  Service:  Endoscopy;  Laterality: N/A;  . CORONARY ARTERY BYPASS GRAFT  1996   [facility]  . CORONARY STENT PLACEMENT  2011   [facility]  . ESOPHAGOGASTRODUODENOSCOPY (EGD) WITH PROPOFOL N/A 05/13/2015   Procedure: ESOPHAGOGASTRODUODENOSCOPY (EGD) WITH PROPOFOL;  Surgeon: Josefine Class, MD;  Location: [facility] ENDOSCOPY;  Service: Endoscopy;  Laterality: N/A;  . UPPER GI ENDOSCOPY  2014    Social History   Socioeconomic History  . Marital status: Married    Spouse name: Not on file  . Number of children: Not on file  . Years of education: Not on file  . Highest education level: Not on file  Occupational History  . Not on file  Social Needs  . Financial resource strain: Not on file  . Food insecurity:    Worry: Not on file    Inability: Not on file  . Transportation needs:    Medical: Not on file    Non-medical: Not on file  Tobacco Use  . Smoking status: Former Smoker    Types: Cigarettes    Last attempt to quit: 04/24/2005    Years since quitting: 12.6  . Smokeless tobacco: Former Systems developer    Types: Chew  Substance and Sexual Activity  . Alcohol use: No  . Drug use: No  . Sexual activity: Yes  Lifestyle  . Physical activity:    Days per week: Not on file    Minutes per session: Not on file  . Stress: Not on file  Relationships  . Social connections:    Talks on phone: Not on file    Gets together: Not on file    Attends religious service: Not on file    Active member of club or organization: Not on file    Attends meetings of clubs or organizations: Not on file    Relationship status: Not on file  . Intimate partner violence:    Fear of current or ex partner: Not on file    Emotionally abused: Not on file    Physically abused: Not on file    Forced sexual activity: Not on file  Other Topics Concern  . Not on file  Social History Narrative  . Not on file    Family History  Problem Relation Age of Onset  . Dementia Mother 71  . Osteoarthritis Mother   . Cancer Other         skin cancer     Current Outpatient Medications:  .  amLODipine (NORVASC) 5 MG tablet, Take 5 mg by mouth daily. , Disp: , Rfl:  .  aspirin 81 MG tablet, Take 81 mg by mouth daily., Disp: , Rfl:  .  atorvastatin (LIPITOR) 80 MG tablet, Take 80 mg by mouth daily., Disp: , Rfl:  .  calcitRIOL (ROCALTROL) 0.25 MCG capsule, , Disp: , Rfl:  .  cetirizine (ZYRTEC) 10 MG tablet, Take 10 mg by mouth daily., Disp: , Rfl:  .  ezetimibe (ZETIA) 10 MG tablet, Take 10 mg by mouth daily., Disp: , Rfl:  .  insulin aspart (NOVOLOG) 100 UNIT/ML injection, Inject 20 Units into the skin 3 (three) times daily. , Disp: , Rfl:  .  Insulin Glargine (LANTUS SOLOSTAR) 100 UNIT/ML Solostar Pen, Inject 70 Units into the skin daily. , Disp: , Rfl:  .  Insulin Pen Needle (FIFTY50  PEN NEEDLES) 32G X 4 MM MISC, USE WITH INJECTIONS 4 TIMES DAILY, Disp: , Rfl:  .  metoprolol (TOPROL-XL) 200 MG 24 hr tablet, Take 200 mg by mouth daily., Disp: , Rfl:  .  Multiple Vitamins-Minerals (MULTIVITAMIN WITH MINERALS) tablet, Take 1 tablet by mouth daily., Disp: , Rfl:  .  pantoprazole (PROTONIX) 20 MG tablet, Take 40 mg by mouth 2 (two) times daily before a meal. , Disp: , Rfl:  .  spironolactone (ALDACTONE) 25 MG tablet, Take 25 mg by mouth 2 (two) times daily., Disp: , Rfl:  .  valsartan-hydrochlorothiazide (DIOVAN-HCT) 320-25 MG per tablet, Take 1 tablet by mouth daily., Disp: , Rfl:  .  FIBER FORMULA PO, Take 2 tablets by mouth 2 (two) times daily. , Disp: , Rfl:  .  gabapentin (NEURONTIN) 300 MG capsule, Take 300 mg by mouth at bedtime., Disp: , Rfl:  .  metFORMIN (GLUCOPHAGE) 500 MG tablet, Take by mouth., Disp: , Rfl:  .  sildenafil (VIAGRA) 100 MG tablet, Take 100 mg by mouth as needed for erectile dysfunction. Reported on 06/14/2015, Disp: , Rfl:  .  triamcinolone cream (KENALOG) 0.1 %, Apply 1 application topically 2 (two) times daily as needed (rash). , Disp: , Rfl:   Physical exam:  Vitals:   11/29/17 1412  BP: (!)  148/86  Pulse: (!) 59  Resp: 18  Temp: (!) 97.3 F (36.3 C)  TempSrc: Tympanic  SpO2: 95%  Weight: 206 lb 4.8 oz (93.6 kg)  Height: 5\' 7"  (1.702 m)   Physical Exam  Constitutional: He is oriented to person, place, and time. He appears well-developed and well-nourished.  HENT:  Head: Normocephalic and atraumatic.  Eyes: Pupils are equal, round, and reactive to light. EOM are normal.  Neck: Normal range of motion.  Cardiovascular: Normal rate, regular rhythm and normal heart sounds.  Pulmonary/Chest: Effort normal and breath sounds normal.  Abdominal: Soft. Bowel sounds are normal.  Neurological: He is alert and oriented to person, place, and time.  Skin: Skin is warm and dry.      CMP Latest Ref Rng & Units 06/15/2016  Glucose 65 - 99 mg/dL 137(H)  BUN 6 - 20 mg/dL 24(H)  Creatinine 0.61 - 1.24 mg/dL 1.97(H)  Sodium 135 - 145 mmol/L 134(L)  Potassium 3.5 - 5.1 mmol/L 4.4  Chloride 101 - 111 mmol/L 103  CO2 22 - 32 mmol/L 24  Calcium 8.9 - 10.3 mg/dL 9.2  Total Protein 6.5 - 8.1 g/dL 7.3  Total Bilirubin 0.3 - 1.2 mg/dL 0.5  Alkaline Phos 38 - 126 U/L 59  AST 15 - 41 U/L 22  ALT 17 - 63 U/L 30   CBC Latest Ref Rng & Units 11/29/2017  WBC 3.8 - 10.6 K/uL 8.1  Hemoglobin 13.0 - 18.0 g/dL 11.4(L)  Hematocrit 40.0 - 52.0 % 33.5(L)  Platelets 150 - 440 K/uL 254     Assessment and plan- Patient is a 67 y.o. male with iron deficiency anemia likely secondary to intermittent hemorrhoidal bleeding  CBC today shows a stable hemoglobin of 11.4 as compared to 3 months ago.  Iron studies are within normal limits and his ferritin is 37 today.  Repeat CBC ferritin and iron studies in 3 months in 6 months and I will see him back in 6 months.  We will also check a B12 level in 3 months.  If there is a decreasing trend in his ferritin with his next set of blood work I will plan to give  him 2 more doses of Feraheme.   Visit Diagnosis 1. Iron deficiency anemia, unspecified iron deficiency  anemia type      Dr. Randa Evens, MD, MPH [facility] at [facility] [phone] 11/30/2017 10:21 AM

## 2017-12-03 DIAGNOSIS — D631 Anemia in chronic kidney disease: Secondary | ICD-10-CM | POA: Diagnosis not present

## 2017-12-03 DIAGNOSIS — E1122 Type 2 diabetes mellitus with diabetic chronic kidney disease: Secondary | ICD-10-CM | POA: Diagnosis not present

## 2017-12-03 DIAGNOSIS — N2581 Secondary hyperparathyroidism of renal origin: Secondary | ICD-10-CM | POA: Diagnosis not present

## 2017-12-03 DIAGNOSIS — I10 Essential (primary) hypertension: Secondary | ICD-10-CM | POA: Diagnosis not present

## 2017-12-03 DIAGNOSIS — N183 Chronic kidney disease, stage 3 (moderate): Secondary | ICD-10-CM | POA: Diagnosis not present

## 2017-12-31 DIAGNOSIS — E1142 Type 2 diabetes mellitus with diabetic polyneuropathy: Secondary | ICD-10-CM | POA: Diagnosis not present

## 2018-01-07 DIAGNOSIS — Z794 Long term (current) use of insulin: Secondary | ICD-10-CM | POA: Diagnosis not present

## 2018-01-07 DIAGNOSIS — E1165 Type 2 diabetes mellitus with hyperglycemia: Secondary | ICD-10-CM | POA: Diagnosis not present

## 2018-01-07 DIAGNOSIS — E1142 Type 2 diabetes mellitus with diabetic polyneuropathy: Secondary | ICD-10-CM | POA: Diagnosis not present

## 2018-01-07 DIAGNOSIS — E1122 Type 2 diabetes mellitus with diabetic chronic kidney disease: Secondary | ICD-10-CM | POA: Diagnosis not present

## 2018-01-07 DIAGNOSIS — I10 Essential (primary) hypertension: Secondary | ICD-10-CM | POA: Diagnosis not present

## 2018-01-07 DIAGNOSIS — E1159 Type 2 diabetes mellitus with other circulatory complications: Secondary | ICD-10-CM | POA: Diagnosis not present

## 2018-01-07 DIAGNOSIS — N184 Chronic kidney disease, stage 4 (severe): Secondary | ICD-10-CM | POA: Diagnosis not present

## 2018-01-09 DIAGNOSIS — E1142 Type 2 diabetes mellitus with diabetic polyneuropathy: Secondary | ICD-10-CM | POA: Diagnosis not present

## 2018-01-09 DIAGNOSIS — N183 Chronic kidney disease, stage 3 (moderate): Secondary | ICD-10-CM | POA: Diagnosis not present

## 2018-01-09 DIAGNOSIS — K219 Gastro-esophageal reflux disease without esophagitis: Secondary | ICD-10-CM | POA: Diagnosis not present

## 2018-01-09 DIAGNOSIS — I2510 Atherosclerotic heart disease of native coronary artery without angina pectoris: Secondary | ICD-10-CM | POA: Diagnosis not present

## 2018-01-09 DIAGNOSIS — E785 Hyperlipidemia, unspecified: Secondary | ICD-10-CM | POA: Diagnosis not present

## 2018-01-09 DIAGNOSIS — M5416 Radiculopathy, lumbar region: Secondary | ICD-10-CM | POA: Diagnosis not present

## 2018-01-09 DIAGNOSIS — I10 Essential (primary) hypertension: Secondary | ICD-10-CM | POA: Diagnosis not present

## 2018-01-16 DIAGNOSIS — Z23 Encounter for immunization: Secondary | ICD-10-CM | POA: Diagnosis not present

## 2018-01-28 DIAGNOSIS — E1122 Type 2 diabetes mellitus with diabetic chronic kidney disease: Secondary | ICD-10-CM | POA: Diagnosis not present

## 2018-01-28 DIAGNOSIS — N2581 Secondary hyperparathyroidism of renal origin: Secondary | ICD-10-CM | POA: Diagnosis not present

## 2018-01-28 DIAGNOSIS — I10 Essential (primary) hypertension: Secondary | ICD-10-CM | POA: Diagnosis not present

## 2018-01-28 DIAGNOSIS — D631 Anemia in chronic kidney disease: Secondary | ICD-10-CM | POA: Diagnosis not present

## 2018-01-28 DIAGNOSIS — N184 Chronic kidney disease, stage 4 (severe): Secondary | ICD-10-CM | POA: Diagnosis not present

## 2018-02-01 DIAGNOSIS — Z23 Encounter for immunization: Secondary | ICD-10-CM | POA: Diagnosis not present

## 2018-02-22 ENCOUNTER — Other Ambulatory Visit: Payer: Self-pay

## 2018-02-22 DIAGNOSIS — D509 Iron deficiency anemia, unspecified: Secondary | ICD-10-CM

## 2018-02-26 ENCOUNTER — Inpatient Hospital Stay (HOSPITAL_BASED_OUTPATIENT_CLINIC_OR_DEPARTMENT_OTHER): Payer: Medicare Other | Admitting: Oncology

## 2018-02-26 ENCOUNTER — Encounter: Payer: Self-pay | Admitting: Oncology

## 2018-02-26 ENCOUNTER — Inpatient Hospital Stay: Payer: Medicare Other | Attending: Oncology

## 2018-02-26 ENCOUNTER — Inpatient Hospital Stay: Payer: Medicare Other

## 2018-02-26 VITALS — BP 166/79 | HR 69 | Temp 98.2°F | Resp 18 | Ht 67.0 in | Wt 206.9 lb

## 2018-02-26 VITALS — BP 168/78 | HR 71

## 2018-02-26 DIAGNOSIS — N183 Chronic kidney disease, stage 3 (moderate): Secondary | ICD-10-CM

## 2018-02-26 DIAGNOSIS — Z85828 Personal history of other malignant neoplasm of skin: Secondary | ICD-10-CM | POA: Diagnosis not present

## 2018-02-26 DIAGNOSIS — E119 Type 2 diabetes mellitus without complications: Secondary | ICD-10-CM | POA: Diagnosis not present

## 2018-02-26 DIAGNOSIS — E213 Hyperparathyroidism, unspecified: Secondary | ICD-10-CM

## 2018-02-26 DIAGNOSIS — I129 Hypertensive chronic kidney disease with stage 1 through stage 4 chronic kidney disease, or unspecified chronic kidney disease: Secondary | ICD-10-CM | POA: Insufficient documentation

## 2018-02-26 DIAGNOSIS — Z79899 Other long term (current) drug therapy: Secondary | ICD-10-CM | POA: Diagnosis not present

## 2018-02-26 DIAGNOSIS — D500 Iron deficiency anemia secondary to blood loss (chronic): Secondary | ICD-10-CM

## 2018-02-26 DIAGNOSIS — E785 Hyperlipidemia, unspecified: Secondary | ICD-10-CM

## 2018-02-26 DIAGNOSIS — Z794 Long term (current) use of insulin: Secondary | ICD-10-CM | POA: Insufficient documentation

## 2018-02-26 DIAGNOSIS — Z87891 Personal history of nicotine dependence: Secondary | ICD-10-CM | POA: Insufficient documentation

## 2018-02-26 DIAGNOSIS — K219 Gastro-esophageal reflux disease without esophagitis: Secondary | ICD-10-CM

## 2018-02-26 DIAGNOSIS — M129 Arthropathy, unspecified: Secondary | ICD-10-CM | POA: Insufficient documentation

## 2018-02-26 DIAGNOSIS — K649 Unspecified hemorrhoids: Secondary | ICD-10-CM

## 2018-02-26 DIAGNOSIS — Z7982 Long term (current) use of aspirin: Secondary | ICD-10-CM

## 2018-02-26 DIAGNOSIS — D509 Iron deficiency anemia, unspecified: Secondary | ICD-10-CM | POA: Insufficient documentation

## 2018-02-26 LAB — CBC WITH DIFFERENTIAL/PLATELET
ABS IMMATURE GRANULOCYTES: 0.03 10*3/uL (ref 0.00–0.07)
BASOS ABS: 0.1 10*3/uL (ref 0.0–0.1)
Basophils Relative: 1 %
EOS ABS: 0.1 10*3/uL (ref 0.0–0.5)
Eosinophils Relative: 1 %
HEMATOCRIT: 28.7 % — AB (ref 39.0–52.0)
Hemoglobin: 9.5 g/dL — ABNORMAL LOW (ref 13.0–17.0)
IMMATURE GRANULOCYTES: 0 %
LYMPHS ABS: 1.3 10*3/uL (ref 0.7–4.0)
Lymphocytes Relative: 18 %
MCH: 29.9 pg (ref 26.0–34.0)
MCHC: 33.1 g/dL (ref 30.0–36.0)
MCV: 90.3 fL (ref 80.0–100.0)
MONOS PCT: 11 %
Monocytes Absolute: 0.8 10*3/uL (ref 0.1–1.0)
NEUTROS ABS: 5.1 10*3/uL (ref 1.7–7.7)
NEUTROS PCT: 69 %
NRBC: 0 % (ref 0.0–0.2)
Platelets: 202 10*3/uL (ref 150–400)
RBC: 3.18 MIL/uL — ABNORMAL LOW (ref 4.22–5.81)
RDW: 13.7 % (ref 11.5–15.5)
WBC: 7.3 10*3/uL (ref 4.0–10.5)

## 2018-02-26 LAB — IRON AND TIBC
IRON: 63 ug/dL (ref 45–182)
Saturation Ratios: 20 % (ref 17.9–39.5)
TIBC: 323 ug/dL (ref 250–450)
UIBC: 260 ug/dL

## 2018-02-26 LAB — FERRITIN: Ferritin: 10 ng/mL — ABNORMAL LOW (ref 24–336)

## 2018-02-26 MED ORDER — SODIUM CHLORIDE 0.9 % IV SOLN
Freq: Once | INTRAVENOUS | Status: AC
  Administered 2018-02-26: 13:00:00 via INTRAVENOUS
  Filled 2018-02-26: qty 250

## 2018-02-26 MED ORDER — SODIUM CHLORIDE 0.9 % IV SOLN
510.0000 mg | Freq: Once | INTRAVENOUS | Status: AC
  Administered 2018-02-26: 510 mg via INTRAVENOUS
  Filled 2018-02-26: qty 17

## 2018-02-26 NOTE — Progress Notes (Signed)
No new changes noted today 

## 2018-02-26 NOTE — Progress Notes (Signed)
Hematology/Oncology Consult note [facility]  Telephone:([phone] Fax:([phone]  Patient Care Team: Leonel Ramsay, MD as PCP - General (Infectious Diseases) Bary Castilla, Forest Gleason, MD (General Surgery) Leonel Ramsay, MD (Infectious Diseases) Anthonette Legato, MD (Internal Medicine)   Name of the patient: Roger Knight  [id number]  1950-10-13   Date of visit: 02/26/18  Diagnosis- iron deficiency anemiapossibly secondary to hemorrhoidal bleeding  Chief complaint/ Reason for visit- routine f/u of iron deficiency anemia  Heme/Onc history: Patient is a 67 year old male with a past medical history significant for hypertension, hyperparathyroidism and staged chronic kidney disease. He has been referred to [place] for evaluation and management of anemia. Most recent CBC from 05/19/2016 showed white count of 7.9, H&H of 9.4/29 with an MCV of 78 and a platelet count of 33. Prior CBC from May 2017 revealed H&H of 12/35.8. Patient also had a recent CBC on 06/12/2016 which showed an H&H of 8.1/26.3 with an MCV of 79.2. Ferritin from October 2017 was low normal at 33.Patient does have a history of hemorrhoids and reports on and off rectal bleeding. Denies any frequent use of NSAIDs. He has had a complete GI evaluation in the past including capsule endoscopy which did not reveal any source of bleeding. Patient has been hospitalized a couple of times and a past for iron deficiency anemia requiring blood transfusion.Patient had an EGD colonoscopy in January 2017. EGD showed a nonbleeding gastric polyp which was biopsied and colonoscopy showed internal and external hemorrhoids. The cause of his GI bleed in the past has been attributed to his hemorrhoids. Patient was on oral iron for about a year or so but stopped taking it in summer of 2017. Currently patient feels well and denies any symptoms of fever, shortness of breath on exertion or chest pain. Patient also has  chronic kidney disease but has not received any EPO shots in the past  Patient hasbeen needing intermittent doses of Feraheme.   Interval history- feels well. Denies any fatigue. No unintentional weight loss. Denies any significant hemorrhoidal bleeding in the recent past  ECOG PS- 0 Pain scale- 0   Review of systems- Review of Systems  Constitutional: Negative for chills, fever, malaise/fatigue and weight loss.  HENT: Negative for congestion, ear discharge and nosebleeds.   Eyes: Negative for blurred vision.  Respiratory: Negative for cough, hemoptysis, sputum production, shortness of breath and wheezing.   Cardiovascular: Negative for chest pain, palpitations, orthopnea and claudication.  Gastrointestinal: Negative for abdominal pain, blood in stool, constipation, diarrhea, heartburn, melena, nausea and vomiting.  Genitourinary: Negative for dysuria, flank pain, frequency, hematuria and urgency.  Musculoskeletal: Negative for back pain, joint pain and myalgias.  Skin: Negative for rash.  Neurological: Negative for dizziness, tingling, focal weakness, seizures, weakness and headaches.  Endo/Heme/Allergies: Does not bruise/bleed easily.  Psychiatric/Behavioral: Negative for depression and suicidal ideas. The patient does not have insomnia.        No Known Allergies   Past Medical History:  Diagnosis Date  . Accelerating angina ([place]) 02/11/2014  . Anemia   . Arthritis   . Carcinoma ([place]) 2012    skin cancer on neck [facility]  . Chronic kidney disease 2016  . Diabetes mellitus without complication ([place])   . GERD (gastroesophageal reflux disease)   . Hemorrhoids 2016-17  . Hernia   . Hyperlipidemia   . Hypertension   . Skin cancer      Past Surgical History:  Procedure Laterality Date  . COLONOSCOPY  April 2014  . COLONOSCOPY N/A 05/13/2015   Procedure: COLONOSCOPY;  Surgeon: Josefine Class, MD;  Location: [facility] ENDOSCOPY;  Service: Endoscopy;  Laterality: N/A;  .  CORONARY ARTERY BYPASS GRAFT  1996   [facility]  . CORONARY STENT PLACEMENT  2011   [facility]  . ESOPHAGOGASTRODUODENOSCOPY (EGD) WITH PROPOFOL N/A 05/13/2015   Procedure: ESOPHAGOGASTRODUODENOSCOPY (EGD) WITH PROPOFOL;  Surgeon: Josefine Class, MD;  Location: [facility] ENDOSCOPY;  Service: Endoscopy;  Laterality: N/A;  . UPPER GI ENDOSCOPY  2014    Social History   Socioeconomic History  . Marital status: Married    Spouse name: Not on file  . Number of children: Not on file  . Years of education: Not on file  . Highest education level: Not on file  Occupational History  . Not on file  Social Needs  . Financial resource strain: Not on file  . Food insecurity:    Worry: Not on file    Inability: Not on file  . Transportation needs:    Medical: Not on file    Non-medical: Not on file  Tobacco Use  . Smoking status: Former Smoker    Types: Cigarettes    Last attempt to quit: 04/24/2005    Years since quitting: 12.8  . Smokeless tobacco: Former Systems developer    Types: Chew  Substance and Sexual Activity  . Alcohol use: No  . Drug use: No  . Sexual activity: Yes  Lifestyle  . Physical activity:    Days per week: Not on file    Minutes per session: Not on file  . Stress: Not on file  Relationships  . Social connections:    Talks on phone: Not on file    Gets together: Not on file    Attends religious service: Not on file    Active member of club or organization: Not on file    Attends meetings of clubs or organizations: Not on file    Relationship status: Not on file  . Intimate partner violence:    Fear of current or ex partner: Not on file    Emotionally abused: Not on file    Physically abused: Not on file    Forced sexual activity: Not on file  Other Topics Concern  . Not on file  Social History Narrative  . Not on file    Family History  Problem Relation Age of Onset  . Dementia Mother 7  . Osteoarthritis Mother   . Cancer Other        skin cancer     Current  Outpatient Medications:  .  amLODipine (NORVASC) 5 MG tablet, Take 5 mg by mouth daily. , Disp: , Rfl:  .  aspirin 81 MG tablet, Take 81 mg by mouth daily., Disp: , Rfl:  .  atorvastatin (LIPITOR) 80 MG tablet, Take 80 mg by mouth daily., Disp: , Rfl:  .  calcitRIOL (ROCALTROL) 0.25 MCG capsule, , Disp: , Rfl:  .  cetirizine (ZYRTEC) 10 MG tablet, Take 10 mg by mouth daily., Disp: , Rfl:  .  ezetimibe (ZETIA) 10 MG tablet, Take 10 mg by mouth daily., Disp: , Rfl:  .  FIBER FORMULA PO, Take 2 tablets by mouth 2 (two) times daily. , Disp: , Rfl:  .  gabapentin (NEURONTIN) 300 MG capsule, Take 300 mg by mouth at bedtime., Disp: , Rfl:  .  insulin aspart (NOVOLOG) 100 UNIT/ML injection, Inject 20 Units into the skin 3 (three) times daily. , Disp: ,  Rfl:  .  metoprolol (TOPROL-XL) 200 MG 24 hr tablet, Take 200 mg by mouth daily., Disp: , Rfl:  .  Multiple Vitamins-Minerals (MULTIVITAMIN WITH MINERALS) tablet, Take 1 tablet by mouth daily., Disp: , Rfl:  .  pantoprazole (PROTONIX) 20 MG tablet, Take 40 mg by mouth 2 (two) times daily before a meal. , Disp: , Rfl:  .  spironolactone (ALDACTONE) 25 MG tablet, Take 25 mg by mouth 2 (two) times daily., Disp: , Rfl:  .  valsartan-hydrochlorothiazide (DIOVAN-HCT) 320-25 MG per tablet, Take 1 tablet by mouth daily., Disp: , Rfl:  .  Insulin Glargine (LANTUS SOLOSTAR) 100 UNIT/ML Solostar Pen, Inject 70 Units into the skin daily. , Disp: , Rfl:  .  Insulin Pen Needle (FIFTY50 PEN NEEDLES) 32G X 4 MM MISC, USE WITH INJECTIONS 4 TIMES DAILY, Disp: , Rfl:  .  metFORMIN (GLUCOPHAGE) 500 MG tablet, Take by mouth., Disp: , Rfl:  .  sildenafil (VIAGRA) 100 MG tablet, Take 100 mg by mouth as needed for erectile dysfunction. Reported on 06/14/2015, Disp: , Rfl:  .  TRESIBA FLEXTOUCH 200 UNIT/ML SOPN, , Disp: , Rfl:  .  triamcinolone cream (KENALOG) 0.1 %, Apply 1 application topically 2 (two) times daily as needed (rash). , Disp: , Rfl:  No current  facility-administered medications for this visit.   Facility-Administered Medications Ordered in Other Visits:  .  ferumoxytol (FERAHEME) 510 mg in sodium chloride 0.9 % 100 mL IVPB, 510 mg, Intravenous, Once, Sindy Guadeloupe, MD, Last Rate: 468 mL/hr at 02/26/18 1329, 510 mg at 02/26/18 1329  Physical exam:  Vitals:   02/26/18 1114  BP: (!) 166/79  Pulse: 69  Resp: 18  Temp: 98.2 F (36.8 C)  TempSrc: Tympanic  SpO2: 97%  Weight: 206 lb 14.4 oz (93.8 kg)  Height: 5\' 7"  (1.702 m)   Physical Exam  Constitutional: He is oriented to person, place, and time. He appears well-developed and well-nourished.  HENT:  Head: Normocephalic and atraumatic.  Eyes: Pupils are equal, round, and reactive to light. EOM are normal.  Neck: Normal range of motion.  Cardiovascular: Normal rate, regular rhythm and normal heart sounds.  Pulmonary/Chest: Effort normal and breath sounds normal.  Abdominal: Soft. Bowel sounds are normal.  Neurological: He is alert and oriented to person, place, and time.  Skin: Skin is warm and dry.     CMP Latest Ref Rng & Units 06/15/2016  Glucose 65 - 99 mg/dL 137(H)  BUN 6 - 20 mg/dL 24(H)  Creatinine 0.61 - 1.24 mg/dL 1.97(H)  Sodium 135 - 145 mmol/L 134(L)  Potassium 3.5 - 5.1 mmol/L 4.4  Chloride 101 - 111 mmol/L 103  CO2 22 - 32 mmol/L 24  Calcium 8.9 - 10.3 mg/dL 9.2  Total Protein 6.5 - 8.1 g/dL 7.3  Total Bilirubin 0.3 - 1.2 mg/dL 0.5  Alkaline Phos 38 - 126 U/L 59  AST 15 - 41 U/L 22  ALT 17 - 63 U/L 30   CBC Latest Ref Rng & Units 02/26/2018  WBC 4.0 - 10.5 K/uL 7.3  Hemoglobin 13.0 - 17.0 g/dL 9.5(L)  Hematocrit 39.0 - 52.0 % 28.7(L)  Platelets 150 - 400 K/uL 202      Assessment and plan- Patient is a 67 y.o. male with iron deficiency anemia likely secondary to intermittent hemorrhoidal bleeding  Hemoglobin today has dropped down to 9.5 from a baseline of 11.  Iron studies also show a low ferritin of 10.  His last IV iron was about  6 months  ago.  He will proceed with 2 more doses of Feraheme at this time and get the first dose today.  Repeat CBC ferritin and iron studies in 3 in 6 months and I will see him back in 6 months.  We will also check his B12 level at 3 months.  I will touch base with Dr. Bary Castilla who he sees for his hemorrhoids as well as had a colonoscopy in the past to consider repeating GI evaluation given ongoing persistent iron deficiency    Visit Diagnosis 1. Iron deficiency anemia, unspecified iron deficiency anemia type   2. Microcytic anemia      Dr. Randa Evens, MD, MPH [facility] at [facility] [phone] 02/26/2018 1:30 PM

## 2018-03-05 ENCOUNTER — Inpatient Hospital Stay: Payer: Medicare Other

## 2018-03-05 VITALS — BP 129/81 | HR 80 | Resp 18

## 2018-03-05 DIAGNOSIS — E213 Hyperparathyroidism, unspecified: Secondary | ICD-10-CM | POA: Diagnosis not present

## 2018-03-05 DIAGNOSIS — K649 Unspecified hemorrhoids: Secondary | ICD-10-CM | POA: Diagnosis not present

## 2018-03-05 DIAGNOSIS — D500 Iron deficiency anemia secondary to blood loss (chronic): Secondary | ICD-10-CM

## 2018-03-05 DIAGNOSIS — I129 Hypertensive chronic kidney disease with stage 1 through stage 4 chronic kidney disease, or unspecified chronic kidney disease: Secondary | ICD-10-CM | POA: Diagnosis not present

## 2018-03-05 DIAGNOSIS — Z79899 Other long term (current) drug therapy: Secondary | ICD-10-CM | POA: Diagnosis not present

## 2018-03-05 DIAGNOSIS — D509 Iron deficiency anemia, unspecified: Secondary | ICD-10-CM | POA: Diagnosis not present

## 2018-03-05 DIAGNOSIS — N183 Chronic kidney disease, stage 3 (moderate): Secondary | ICD-10-CM | POA: Diagnosis not present

## 2018-03-05 MED ORDER — SODIUM CHLORIDE 0.9 % IV SOLN
510.0000 mg | Freq: Once | INTRAVENOUS | Status: AC
  Administered 2018-03-05: 510 mg via INTRAVENOUS
  Filled 2018-03-05: qty 17

## 2018-03-05 MED ORDER — SODIUM CHLORIDE 0.9 % IV SOLN
Freq: Once | INTRAVENOUS | Status: AC
  Administered 2018-03-05: 14:00:00 via INTRAVENOUS
  Filled 2018-03-05: qty 250

## 2018-03-27 DIAGNOSIS — E119 Type 2 diabetes mellitus without complications: Secondary | ICD-10-CM | POA: Diagnosis not present

## 2018-03-28 DIAGNOSIS — I10 Essential (primary) hypertension: Secondary | ICD-10-CM | POA: Diagnosis not present

## 2018-03-28 DIAGNOSIS — E1142 Type 2 diabetes mellitus with diabetic polyneuropathy: Secondary | ICD-10-CM | POA: Diagnosis not present

## 2018-03-28 DIAGNOSIS — I2510 Atherosclerotic heart disease of native coronary artery without angina pectoris: Secondary | ICD-10-CM | POA: Diagnosis not present

## 2018-03-28 DIAGNOSIS — I200 Unstable angina: Secondary | ICD-10-CM | POA: Diagnosis not present

## 2018-04-08 DIAGNOSIS — E1122 Type 2 diabetes mellitus with diabetic chronic kidney disease: Secondary | ICD-10-CM | POA: Diagnosis not present

## 2018-04-08 DIAGNOSIS — N184 Chronic kidney disease, stage 4 (severe): Secondary | ICD-10-CM | POA: Diagnosis not present

## 2018-04-08 DIAGNOSIS — D631 Anemia in chronic kidney disease: Secondary | ICD-10-CM | POA: Diagnosis not present

## 2018-04-08 DIAGNOSIS — N2581 Secondary hyperparathyroidism of renal origin: Secondary | ICD-10-CM | POA: Diagnosis not present

## 2018-04-08 DIAGNOSIS — I10 Essential (primary) hypertension: Secondary | ICD-10-CM | POA: Diagnosis not present

## 2018-04-09 DIAGNOSIS — E1165 Type 2 diabetes mellitus with hyperglycemia: Secondary | ICD-10-CM | POA: Diagnosis not present

## 2018-04-15 DIAGNOSIS — E1122 Type 2 diabetes mellitus with diabetic chronic kidney disease: Secondary | ICD-10-CM | POA: Diagnosis not present

## 2018-04-15 DIAGNOSIS — E1159 Type 2 diabetes mellitus with other circulatory complications: Secondary | ICD-10-CM | POA: Diagnosis not present

## 2018-04-15 DIAGNOSIS — I10 Essential (primary) hypertension: Secondary | ICD-10-CM | POA: Diagnosis not present

## 2018-04-15 DIAGNOSIS — N184 Chronic kidney disease, stage 4 (severe): Secondary | ICD-10-CM | POA: Diagnosis not present

## 2018-04-15 DIAGNOSIS — E1142 Type 2 diabetes mellitus with diabetic polyneuropathy: Secondary | ICD-10-CM | POA: Diagnosis not present

## 2018-04-15 DIAGNOSIS — Z794 Long term (current) use of insulin: Secondary | ICD-10-CM | POA: Diagnosis not present

## 2018-05-29 ENCOUNTER — Inpatient Hospital Stay: Payer: Medicare Other | Attending: Oncology

## 2018-05-29 DIAGNOSIS — D509 Iron deficiency anemia, unspecified: Secondary | ICD-10-CM

## 2018-05-29 DIAGNOSIS — Z79899 Other long term (current) drug therapy: Secondary | ICD-10-CM | POA: Diagnosis not present

## 2018-05-29 DIAGNOSIS — D500 Iron deficiency anemia secondary to blood loss (chronic): Secondary | ICD-10-CM | POA: Diagnosis not present

## 2018-05-29 LAB — CBC
HCT: 29.5 % — ABNORMAL LOW (ref 39.0–52.0)
Hemoglobin: 9.7 g/dL — ABNORMAL LOW (ref 13.0–17.0)
MCH: 30 pg (ref 26.0–34.0)
MCHC: 32.9 g/dL (ref 30.0–36.0)
MCV: 91.3 fL (ref 80.0–100.0)
NRBC: 0 % (ref 0.0–0.2)
PLATELETS: 242 10*3/uL (ref 150–400)
RBC: 3.23 MIL/uL — ABNORMAL LOW (ref 4.22–5.81)
RDW: 13.2 % (ref 11.5–15.5)
WBC: 7 10*3/uL (ref 4.0–10.5)

## 2018-05-29 LAB — IRON AND TIBC
Iron: 64 ug/dL (ref 45–182)
SATURATION RATIOS: 21 % (ref 17.9–39.5)
TIBC: 305 ug/dL (ref 250–450)
UIBC: 241 ug/dL

## 2018-05-29 LAB — VITAMIN B12: Vitamin B-12: 424 pg/mL (ref 180–914)

## 2018-05-29 LAB — FERRITIN: FERRITIN: 13 ng/mL — AB (ref 24–336)

## 2018-05-30 ENCOUNTER — Other Ambulatory Visit: Payer: Self-pay | Admitting: Oncology

## 2018-05-31 ENCOUNTER — Telehealth: Payer: Self-pay | Admitting: *Deleted

## 2018-05-31 NOTE — Telephone Encounter (Signed)
-----   Message from Sindy Guadeloupe, MD sent at 05/30/2018  8:17 AM EST ----- Needs feraheme X2

## 2018-05-31 NOTE — Telephone Encounter (Signed)
Patient and got his voicemail-a message for him to call back letting him know that his iron was low and Dr. Janese Banks is recommending 2 doses of Feraheme as well as a re-eval with GI because of his frequent iron needs. Gave my direct number to call me back at

## 2018-06-05 ENCOUNTER — Inpatient Hospital Stay: Payer: Medicare Other

## 2018-06-05 VITALS — BP 150/72 | HR 74 | Resp 20

## 2018-06-05 DIAGNOSIS — D500 Iron deficiency anemia secondary to blood loss (chronic): Secondary | ICD-10-CM | POA: Diagnosis not present

## 2018-06-05 DIAGNOSIS — Z79899 Other long term (current) drug therapy: Secondary | ICD-10-CM | POA: Diagnosis not present

## 2018-06-05 MED ORDER — SODIUM CHLORIDE 0.9 % IV SOLN
510.0000 mg | Freq: Once | INTRAVENOUS | Status: AC
  Administered 2018-06-05: 510 mg via INTRAVENOUS
  Filled 2018-06-05: qty 17

## 2018-06-05 MED ORDER — SODIUM CHLORIDE 0.9 % IV SOLN
Freq: Once | INTRAVENOUS | Status: AC
  Administered 2018-06-05: 14:00:00 via INTRAVENOUS
  Filled 2018-06-05: qty 250

## 2018-06-12 ENCOUNTER — Inpatient Hospital Stay: Payer: Medicare Other

## 2018-06-12 VITALS — BP 151/80 | HR 61 | Temp 97.0°F | Resp 20

## 2018-06-12 DIAGNOSIS — Z79899 Other long term (current) drug therapy: Secondary | ICD-10-CM | POA: Diagnosis not present

## 2018-06-12 DIAGNOSIS — D500 Iron deficiency anemia secondary to blood loss (chronic): Secondary | ICD-10-CM

## 2018-06-12 MED ORDER — SODIUM CHLORIDE 0.9 % IV SOLN
Freq: Once | INTRAVENOUS | Status: AC
  Administered 2018-06-12: 14:00:00 via INTRAVENOUS
  Filled 2018-06-12: qty 250

## 2018-06-12 MED ORDER — SODIUM CHLORIDE 0.9 % IV SOLN
510.0000 mg | Freq: Once | INTRAVENOUS | Status: AC
  Administered 2018-06-12: 510 mg via INTRAVENOUS
  Filled 2018-06-12: qty 17

## 2018-07-03 DIAGNOSIS — E785 Hyperlipidemia, unspecified: Secondary | ICD-10-CM | POA: Diagnosis not present

## 2018-07-03 DIAGNOSIS — I10 Essential (primary) hypertension: Secondary | ICD-10-CM | POA: Diagnosis not present

## 2018-07-10 DIAGNOSIS — K219 Gastro-esophageal reflux disease without esophagitis: Secondary | ICD-10-CM | POA: Diagnosis not present

## 2018-07-10 DIAGNOSIS — I2510 Atherosclerotic heart disease of native coronary artery without angina pectoris: Secondary | ICD-10-CM | POA: Diagnosis not present

## 2018-07-10 DIAGNOSIS — E1149 Type 2 diabetes mellitus with other diabetic neurological complication: Secondary | ICD-10-CM | POA: Diagnosis not present

## 2018-07-10 DIAGNOSIS — E1142 Type 2 diabetes mellitus with diabetic polyneuropathy: Secondary | ICD-10-CM | POA: Diagnosis not present

## 2018-07-10 DIAGNOSIS — Z0000 Encounter for general adult medical examination without abnormal findings: Secondary | ICD-10-CM | POA: Diagnosis not present

## 2018-07-10 DIAGNOSIS — I10 Essential (primary) hypertension: Secondary | ICD-10-CM | POA: Diagnosis not present

## 2018-07-10 DIAGNOSIS — N183 Chronic kidney disease, stage 3 (moderate): Secondary | ICD-10-CM | POA: Diagnosis not present

## 2018-07-15 DIAGNOSIS — E1159 Type 2 diabetes mellitus with other circulatory complications: Secondary | ICD-10-CM | POA: Diagnosis not present

## 2018-07-22 DIAGNOSIS — I10 Essential (primary) hypertension: Secondary | ICD-10-CM | POA: Diagnosis not present

## 2018-07-22 DIAGNOSIS — E1122 Type 2 diabetes mellitus with diabetic chronic kidney disease: Secondary | ICD-10-CM | POA: Diagnosis not present

## 2018-07-22 DIAGNOSIS — N184 Chronic kidney disease, stage 4 (severe): Secondary | ICD-10-CM | POA: Diagnosis not present

## 2018-07-22 DIAGNOSIS — Z794 Long term (current) use of insulin: Secondary | ICD-10-CM | POA: Diagnosis not present

## 2018-07-22 DIAGNOSIS — E1159 Type 2 diabetes mellitus with other circulatory complications: Secondary | ICD-10-CM | POA: Diagnosis not present

## 2018-07-22 DIAGNOSIS — E1142 Type 2 diabetes mellitus with diabetic polyneuropathy: Secondary | ICD-10-CM | POA: Diagnosis not present

## 2018-08-12 DIAGNOSIS — E1122 Type 2 diabetes mellitus with diabetic chronic kidney disease: Secondary | ICD-10-CM | POA: Diagnosis not present

## 2018-08-12 DIAGNOSIS — I10 Essential (primary) hypertension: Secondary | ICD-10-CM | POA: Diagnosis not present

## 2018-08-12 DIAGNOSIS — N2581 Secondary hyperparathyroidism of renal origin: Secondary | ICD-10-CM | POA: Diagnosis not present

## 2018-08-12 DIAGNOSIS — D631 Anemia in chronic kidney disease: Secondary | ICD-10-CM | POA: Diagnosis not present

## 2018-08-12 DIAGNOSIS — N184 Chronic kidney disease, stage 4 (severe): Secondary | ICD-10-CM | POA: Diagnosis not present

## 2018-08-26 ENCOUNTER — Other Ambulatory Visit: Payer: Self-pay

## 2018-08-27 ENCOUNTER — Inpatient Hospital Stay: Payer: Medicare Other | Admitting: Oncology

## 2018-08-27 ENCOUNTER — Other Ambulatory Visit: Payer: Self-pay

## 2018-08-27 ENCOUNTER — Inpatient Hospital Stay: Payer: Medicare Other | Attending: Oncology | Admitting: *Deleted

## 2018-08-27 DIAGNOSIS — D509 Iron deficiency anemia, unspecified: Secondary | ICD-10-CM

## 2018-08-27 LAB — IRON AND TIBC
Iron: 70 ug/dL (ref 45–182)
Saturation Ratios: 24 % (ref 17.9–39.5)
TIBC: 290 ug/dL (ref 250–450)
UIBC: 220 ug/dL

## 2018-08-27 LAB — CBC
HCT: 30.4 % — ABNORMAL LOW (ref 39.0–52.0)
Hemoglobin: 10 g/dL — ABNORMAL LOW (ref 13.0–17.0)
MCH: 30.1 pg (ref 26.0–34.0)
MCHC: 32.9 g/dL (ref 30.0–36.0)
MCV: 91.6 fL (ref 80.0–100.0)
Platelets: 188 10*3/uL (ref 150–400)
RBC: 3.32 MIL/uL — ABNORMAL LOW (ref 4.22–5.81)
RDW: 14.2 % (ref 11.5–15.5)
WBC: 5.9 10*3/uL (ref 4.0–10.5)
nRBC: 0 % (ref 0.0–0.2)

## 2018-08-27 LAB — FERRITIN: Ferritin: 57 ng/mL (ref 24–336)

## 2018-08-28 ENCOUNTER — Inpatient Hospital Stay (HOSPITAL_BASED_OUTPATIENT_CLINIC_OR_DEPARTMENT_OTHER): Payer: Medicare Other | Admitting: Oncology

## 2018-08-28 ENCOUNTER — Encounter: Payer: Self-pay | Admitting: Oncology

## 2018-08-28 DIAGNOSIS — D509 Iron deficiency anemia, unspecified: Secondary | ICD-10-CM

## 2018-08-28 NOTE — Progress Notes (Signed)
I connected with Roger Knight on 08/28/18 at  2:15 PM EDT by video enabled telemedicine visit and verified that I am speaking with the correct person using two identifiers.   I discussed the limitations, risks, security and privacy concerns of performing an evaluation and management service by telemedicine and the availability of in-person appointments. I also discussed with the patient that there may be a patient responsible charge related to this service. The patient expressed understanding and agreed to proceed.  Other persons participating in the visit and their role in the encounter:  none  Patient's location:  home Provider's location:  home  Chief Complaint: Follow-up of iron deficiency anemia  History of present illness: Patient is a 68 year old male with a past medical history significant for hypertension, hyperparathyroidism and staged chronic kidney disease. He has been referred to [place] for evaluation and management of anemia. Most recent CBC from 05/19/2016 showed white count of 7.9, H&H of 9.4/29 with an MCV of 78 and a platelet count of 33. Prior CBC from May 2017 revealed H&H of 12/35.8. Patient also had a recent CBC on 06/12/2016 which showed an H&H of 8.1/26.3 with an MCV of 79.2. Ferritin from October 2017 was low normal at 33.Patient does have a history of hemorrhoids and reports on and off rectal bleeding. Denies any frequent use of NSAIDs. He has had a complete GI evaluation in the past including capsule endoscopy which did not reveal any source of bleeding. Patient has been hospitalized a couple of times and a past for iron deficiency anemia requiring blood transfusion.Patient had an EGD colonoscopy in January 2017. EGD showed a nonbleeding gastric polyp which was biopsied and colonoscopy showed internal and external hemorrhoids. The cause of his GI bleed in the past has been attributed to his hemorrhoids. Patient was on oral iron for about a year or so but stopped taking it in  summer of 2017. Currently patient feels well and denies any symptoms of fever, shortness of breath on exertion or chest pain. Patient also has chronic kidney disease but has not received any EPO shots in the past  Patient hasbeen needing intermittent doses of Feraheme.  Interval history reports feeling well.  Denies any recent episodes of hemorrhoidal bleeding.  Denies any fatigue or blood in his stool or urine.  Denies any dark melanotic stools   Review of Systems  Constitutional: Negative for chills, fever, malaise/fatigue and weight loss.  HENT: Negative for congestion, ear discharge and nosebleeds.   Eyes: Negative for blurred vision.  Respiratory: Negative for cough, hemoptysis, sputum production, shortness of breath and wheezing.   Cardiovascular: Negative for chest pain, palpitations, orthopnea and claudication.  Gastrointestinal: Negative for abdominal pain, blood in stool, constipation, diarrhea, heartburn, melena, nausea and vomiting.  Genitourinary: Negative for dysuria, flank pain, frequency, hematuria and urgency.  Musculoskeletal: Negative for back pain, joint pain and myalgias.  Skin: Negative for rash.  Neurological: Negative for dizziness, tingling, focal weakness, seizures, weakness and headaches.  Endo/Heme/Allergies: Does not bruise/bleed easily.  Psychiatric/Behavioral: Negative for depression and suicidal ideas. The patient does not have insomnia.     No Known Allergies  Past Medical History:  Diagnosis Date  . Accelerating angina ([place]) 02/11/2014  . Anemia   . Arthritis   . Carcinoma ([place]) 2012    skin cancer on neck [facility]  . Chronic kidney disease 2016  . Diabetes mellitus without complication ([place])   . GERD (gastroesophageal reflux disease)   . Hemorrhoids 2016-17  . Hernia   .  Hyperlipidemia   . Hypertension   . Skin cancer     Past Surgical History:  Procedure Laterality Date  . COLONOSCOPY  April 2014  . COLONOSCOPY N/A 05/13/2015   Procedure:  COLONOSCOPY;  Surgeon: Josefine Class, MD;  Location: [facility] ENDOSCOPY;  Service: Endoscopy;  Laterality: N/A;  . CORONARY ARTERY BYPASS GRAFT  1996   [facility]  . CORONARY STENT PLACEMENT  2011   [facility]  . ESOPHAGOGASTRODUODENOSCOPY (EGD) WITH PROPOFOL N/A 05/13/2015   Procedure: ESOPHAGOGASTRODUODENOSCOPY (EGD) WITH PROPOFOL;  Surgeon: Josefine Class, MD;  Location: [facility] ENDOSCOPY;  Service: Endoscopy;  Laterality: N/A;  . UPPER GI ENDOSCOPY  2014    Social History   Socioeconomic History  . Marital status: Married    Spouse name: Not on file  . Number of children: Not on file  . Years of education: Not on file  . Highest education level: Not on file  Occupational History  . Not on file  Social Needs  . Financial resource strain: Not on file  . Food insecurity:    Worry: Not on file    Inability: Not on file  . Transportation needs:    Medical: Not on file    Non-medical: Not on file  Tobacco Use  . Smoking status: Former Smoker    Types: Cigarettes    Last attempt to quit: 04/24/2005    Years since quitting: 13.3  . Smokeless tobacco: Former Systems developer    Types: Chew  Substance and Sexual Activity  . Alcohol use: No  . Drug use: No  . Sexual activity: Yes  Lifestyle  . Physical activity:    Days per week: Not on file    Minutes per session: Not on file  . Stress: Not on file  Relationships  . Social connections:    Talks on phone: Not on file    Gets together: Not on file    Attends religious service: Not on file    Active member of club or organization: Not on file    Attends meetings of clubs or organizations: Not on file    Relationship status: Not on file  . Intimate partner violence:    Fear of current or ex partner: Not on file    Emotionally abused: Not on file    Physically abused: Not on file    Forced sexual activity: Not on file  Other Topics Concern  . Not on file  Social History Narrative  . Not on file    Family History  Problem Relation Age of  Onset  . Dementia Mother 62  . Osteoarthritis Mother   . Cancer Mother      Current Outpatient Medications:  .  amLODipine (NORVASC) 5 MG tablet, Take 5 mg by mouth daily. , Disp: , Rfl:  .  aspirin 81 MG tablet, Take 81 mg by mouth daily., Disp: , Rfl:  .  atorvastatin (LIPITOR) 80 MG tablet, Take 80 mg by mouth daily., Disp: , Rfl:  .  calcitRIOL (ROCALTROL) 0.25 MCG capsule, Take 0.25 mcg by mouth daily. , Disp: , Rfl:  .  cetirizine (ZYRTEC) 10 MG tablet, Take 10 mg by mouth daily., Disp: , Rfl:  .  ezetimibe (ZETIA) 10 MG tablet, Take 10 mg by mouth daily., Disp: , Rfl:  .  FIBER FORMULA PO, Take 2 tablets by mouth 2 (two) times daily. , Disp: , Rfl:  .  gabapentin (NEURONTIN) 300 MG capsule, Take 300 mg by mouth at bedtime., Disp: ,  Rfl:  .  insulin aspart (NOVOLOG) 100 UNIT/ML injection, Inject 20 Units into the skin 3 (three) times daily. , Disp: , Rfl:  .  Insulin Pen Needle (FIFTY50 PEN NEEDLES) 32G X 4 MM MISC, USE WITH INJECTIONS 4 TIMES DAILY, Disp: , Rfl:  .  metoprolol (TOPROL-XL) 200 MG 24 hr tablet, Take 200 mg by mouth daily., Disp: , Rfl:  .  Multiple Vitamins-Minerals (MULTIVITAMIN WITH MINERALS) tablet, Take 1 tablet by mouth daily., Disp: , Rfl:  .  pantoprazole (PROTONIX) 20 MG tablet, Take 40 mg by mouth 2 (two) times daily before a meal. , Disp: , Rfl:  .  sildenafil (VIAGRA) 100 MG tablet, Take 100 mg by mouth as needed for erectile dysfunction. Reported on 06/14/2015, Disp: , Rfl:  .  spironolactone (ALDACTONE) 25 MG tablet, Take 25 mg by mouth 2 (two) times daily., Disp: , Rfl:  .  TRESIBA FLEXTOUCH 200 UNIT/ML SOPN, Inject 90 Units into the skin daily. , Disp: , Rfl:  .  valsartan-hydrochlorothiazide (DIOVAN-HCT) 320-25 MG per tablet, Take 1 tablet by mouth daily., Disp: , Rfl:   No results found.  No images are attached to the encounter.   CMP Latest Ref Rng & Units 06/15/2016  Glucose 65 - 99 mg/dL 137(H)  BUN 6 - 20 mg/dL 24(H)  Creatinine 0.61 - 1.24  mg/dL 1.97(H)  Sodium 135 - 145 mmol/L 134(L)  Potassium 3.5 - 5.1 mmol/L 4.4  Chloride 101 - 111 mmol/L 103  CO2 22 - 32 mmol/L 24  Calcium 8.9 - 10.3 mg/dL 9.2  Total Protein 6.5 - 8.1 g/dL 7.3  Total Bilirubin 0.3 - 1.2 mg/dL 0.5  Alkaline Phos 38 - 126 U/L 59  AST 15 - 41 U/L 22  ALT 17 - 63 U/L 30   CBC Latest Ref Rng & Units 08/27/2018  WBC 4.0 - 10.5 K/uL 5.9  Hemoglobin 13.0 - 17.0 g/dL 10.0(L)  Hematocrit 39.0 - 52.0 % 30.4(L)  Platelets 150 - 400 K/uL 188     Observation/objective: Appears in no acute distress of a video visit today.  Breathing is nonlabored  Assessment and plan: Patient is a 68 year old male with a history of iron deficiency anemia possibly secondary to hemorrhoidal bleeding  Hemoglobin remained stable around 10.  He probably has some baseline anemia from chronic disease.  Iron studies are within normal limits and ferritin is greater than 50.  He does not require any IV iron at this time.  Follow-up instructions: Repeat CBC ferritin and iron studies in 3 in 6 months and I will see him back in 6 months  I discussed the assessment and treatment plan with the patient. The patient was provided an opportunity to ask questions and all were answered. The patient agreed with the plan and demonstrated an understanding of the instructions.   The patient was advised to call back or seek an in-person evaluation if the symptoms worsen or if the condition fails to improve as anticipated.   Visit Diagnosis: 1. Iron deficiency anemia, unspecified iron deficiency anemia type     Dr. Randa Evens, MD, MPH [facility] at [facility] [phone] 08/28/2018 2:49 PM

## 2018-08-28 NOTE — Progress Notes (Signed)
Never had any sx. No complaints about energy.

## 2018-10-14 DIAGNOSIS — D631 Anemia in chronic kidney disease: Secondary | ICD-10-CM | POA: Diagnosis not present

## 2018-10-14 DIAGNOSIS — E1159 Type 2 diabetes mellitus with other circulatory complications: Secondary | ICD-10-CM | POA: Diagnosis not present

## 2018-10-14 DIAGNOSIS — E1122 Type 2 diabetes mellitus with diabetic chronic kidney disease: Secondary | ICD-10-CM | POA: Diagnosis not present

## 2018-10-14 DIAGNOSIS — N184 Chronic kidney disease, stage 4 (severe): Secondary | ICD-10-CM | POA: Diagnosis not present

## 2018-10-14 DIAGNOSIS — E1142 Type 2 diabetes mellitus with diabetic polyneuropathy: Secondary | ICD-10-CM | POA: Diagnosis not present

## 2018-10-14 DIAGNOSIS — N2581 Secondary hyperparathyroidism of renal origin: Secondary | ICD-10-CM | POA: Diagnosis not present

## 2018-10-14 DIAGNOSIS — I10 Essential (primary) hypertension: Secondary | ICD-10-CM | POA: Diagnosis not present

## 2018-10-14 DIAGNOSIS — N183 Chronic kidney disease, stage 3 (moderate): Secondary | ICD-10-CM | POA: Diagnosis not present

## 2018-10-15 DIAGNOSIS — E1142 Type 2 diabetes mellitus with diabetic polyneuropathy: Secondary | ICD-10-CM | POA: Diagnosis not present

## 2018-10-15 DIAGNOSIS — I10 Essential (primary) hypertension: Secondary | ICD-10-CM | POA: Diagnosis not present

## 2018-10-15 DIAGNOSIS — N183 Chronic kidney disease, stage 3 (moderate): Secondary | ICD-10-CM | POA: Diagnosis not present

## 2018-10-15 DIAGNOSIS — E1159 Type 2 diabetes mellitus with other circulatory complications: Secondary | ICD-10-CM | POA: Diagnosis not present

## 2018-10-21 DIAGNOSIS — E1122 Type 2 diabetes mellitus with diabetic chronic kidney disease: Secondary | ICD-10-CM | POA: Diagnosis not present

## 2018-10-21 DIAGNOSIS — E1142 Type 2 diabetes mellitus with diabetic polyneuropathy: Secondary | ICD-10-CM | POA: Diagnosis not present

## 2018-10-21 DIAGNOSIS — I10 Essential (primary) hypertension: Secondary | ICD-10-CM | POA: Diagnosis not present

## 2018-10-21 DIAGNOSIS — N184 Chronic kidney disease, stage 4 (severe): Secondary | ICD-10-CM | POA: Diagnosis not present

## 2018-10-21 DIAGNOSIS — N2581 Secondary hyperparathyroidism of renal origin: Secondary | ICD-10-CM | POA: Diagnosis not present

## 2018-10-21 DIAGNOSIS — Z794 Long term (current) use of insulin: Secondary | ICD-10-CM | POA: Diagnosis not present

## 2018-10-21 DIAGNOSIS — E1159 Type 2 diabetes mellitus with other circulatory complications: Secondary | ICD-10-CM | POA: Diagnosis not present

## 2018-11-06 DIAGNOSIS — H43813 Vitreous degeneration, bilateral: Secondary | ICD-10-CM | POA: Diagnosis not present

## 2018-11-28 ENCOUNTER — Other Ambulatory Visit: Payer: Self-pay

## 2018-11-28 ENCOUNTER — Inpatient Hospital Stay: Payer: Medicare Other | Attending: Oncology

## 2018-11-28 DIAGNOSIS — D509 Iron deficiency anemia, unspecified: Secondary | ICD-10-CM | POA: Diagnosis not present

## 2018-11-28 LAB — CBC
HCT: 28.1 % — ABNORMAL LOW (ref 39.0–52.0)
Hemoglobin: 9.3 g/dL — ABNORMAL LOW (ref 13.0–17.0)
MCH: 29.9 pg (ref 26.0–34.0)
MCHC: 33.1 g/dL (ref 30.0–36.0)
MCV: 90.4 fL (ref 80.0–100.0)
Platelets: 214 10*3/uL (ref 150–400)
RBC: 3.11 MIL/uL — ABNORMAL LOW (ref 4.22–5.81)
RDW: 13.3 % (ref 11.5–15.5)
WBC: 7 10*3/uL (ref 4.0–10.5)
nRBC: 0 % (ref 0.0–0.2)

## 2018-11-28 LAB — FERRITIN: Ferritin: 80 ng/mL (ref 24–336)

## 2018-11-28 LAB — IRON AND TIBC
Iron: 57 ug/dL (ref 45–182)
Saturation Ratios: 17 % — ABNORMAL LOW (ref 17.9–39.5)
TIBC: 333 ug/dL (ref 250–450)
UIBC: 276 ug/dL

## 2019-01-03 DIAGNOSIS — Z23 Encounter for immunization: Secondary | ICD-10-CM | POA: Diagnosis not present

## 2019-01-06 ENCOUNTER — Other Ambulatory Visit: Payer: Self-pay | Admitting: Oncology

## 2019-01-06 ENCOUNTER — Ambulatory Visit
Admission: RE | Admit: 2019-01-06 | Discharge: 2019-01-06 | Disposition: A | Discharge status: Home / Self Care | Payer: Medicare Other | Source: Ambulatory Visit | Attending: Cardiovascular Disease | Admitting: Cardiovascular Disease

## 2019-01-06 ENCOUNTER — Other Ambulatory Visit: Payer: Self-pay

## 2019-01-06 ENCOUNTER — Telehealth: Payer: Self-pay | Admitting: *Deleted

## 2019-01-06 DIAGNOSIS — K5900 Constipation, unspecified: Secondary | ICD-10-CM | POA: Diagnosis not present

## 2019-01-06 DIAGNOSIS — I2510 Atherosclerotic heart disease of native coronary artery without angina pectoris: Secondary | ICD-10-CM | POA: Diagnosis not present

## 2019-01-06 DIAGNOSIS — N189 Chronic kidney disease, unspecified: Secondary | ICD-10-CM | POA: Diagnosis not present

## 2019-01-06 DIAGNOSIS — D649 Anemia, unspecified: Secondary | ICD-10-CM | POA: Diagnosis not present

## 2019-01-06 DIAGNOSIS — E1122 Type 2 diabetes mellitus with diabetic chronic kidney disease: Secondary | ICD-10-CM | POA: Diagnosis not present

## 2019-01-06 DIAGNOSIS — D500 Iron deficiency anemia secondary to blood loss (chronic): Secondary | ICD-10-CM | POA: Diagnosis not present

## 2019-01-06 DIAGNOSIS — I129 Hypertensive chronic kidney disease with stage 1 through stage 4 chronic kidney disease, or unspecified chronic kidney disease: Secondary | ICD-10-CM | POA: Diagnosis not present

## 2019-01-06 DIAGNOSIS — Z794 Long term (current) use of insulin: Secondary | ICD-10-CM | POA: Diagnosis not present

## 2019-01-06 DIAGNOSIS — E785 Hyperlipidemia, unspecified: Secondary | ICD-10-CM | POA: Diagnosis not present

## 2019-01-06 DIAGNOSIS — K922 Gastrointestinal hemorrhage, unspecified: Secondary | ICD-10-CM | POA: Diagnosis not present

## 2019-01-06 LAB — HEMOGLOBIN: Hemoglobin: 7.7 g/dL — ABNORMAL LOW (ref 13.0–17.0)

## 2019-01-06 LAB — PREPARE RBC (CROSSMATCH)

## 2019-01-06 LAB — GLUCOSE, CAPILLARY: Glucose-Capillary: 125 mg/dL — ABNORMAL HIGH (ref 70–99)

## 2019-01-06 MED ORDER — ACETAMINOPHEN 500 MG PO TABS
500.0000 mg | ORAL_TABLET | Freq: Once | ORAL | Status: AC
  Administered 2019-01-06: 500 mg via ORAL

## 2019-01-06 MED ORDER — DIPHENHYDRAMINE HCL 25 MG PO CAPS
25.0000 mg | ORAL_CAPSULE | Freq: Once | ORAL | Status: AC
  Administered 2019-01-06: 25 mg via ORAL

## 2019-01-06 NOTE — Progress Notes (Signed)
HGB result 7.7 , VSS, IV discontinued left hand pt. Discharged home .

## 2019-01-06 NOTE — Telephone Encounter (Signed)
Dr. Janese Banks got a call from Dr. Ola Spurr about patient had had a hemoglobin drop and he was going to same-day surgery today to get a blood transfusions due to hemorrhoids.  Dr. Janese Banks says patient needs 2 doses of Feraheme and see her back in 8 weeks.  I called the patient and he is agreeable to this Friday at 11 AM for 1 dose of Feraheme then the following week on Wednesday 130 for the second dose of Feraheme and he already has an appointment November 8 to come back and see Dr. Janese Banks so he would just keep that appointment.  Patient agreeable to all of them and he can see it on my chart.

## 2019-01-07 LAB — TYPE AND SCREEN
ABO/RH(D): O POS
Antibody Screen: NEGATIVE
Unit division: 0

## 2019-01-07 LAB — BPAM RBC
Blood Product Expiration Date: 202010112359
ISSUE DATE / TIME: 202009141302
Unit Type and Rh: 5100

## 2019-01-09 ENCOUNTER — Ambulatory Visit: Payer: Medicare Other

## 2019-01-09 DIAGNOSIS — R079 Chest pain, unspecified: Secondary | ICD-10-CM | POA: Diagnosis not present

## 2019-01-10 ENCOUNTER — Inpatient Hospital Stay: Payer: Medicare Other | Attending: Oncology

## 2019-01-10 ENCOUNTER — Other Ambulatory Visit: Payer: Self-pay

## 2019-01-10 VITALS — BP 129/62 | HR 58 | Temp 97.1°F | Resp 18

## 2019-01-10 DIAGNOSIS — Z79899 Other long term (current) drug therapy: Secondary | ICD-10-CM | POA: Insufficient documentation

## 2019-01-10 DIAGNOSIS — D500 Iron deficiency anemia secondary to blood loss (chronic): Secondary | ICD-10-CM

## 2019-01-10 DIAGNOSIS — D509 Iron deficiency anemia, unspecified: Secondary | ICD-10-CM | POA: Diagnosis not present

## 2019-01-10 MED ORDER — SODIUM CHLORIDE 0.9 % IV SOLN
510.0000 mg | Freq: Once | INTRAVENOUS | Status: AC
  Administered 2019-01-10: 510 mg via INTRAVENOUS
  Filled 2019-01-10: qty 17

## 2019-01-10 MED ORDER — SODIUM CHLORIDE 0.9 % IV SOLN
Freq: Once | INTRAVENOUS | Status: AC
  Administered 2019-01-10: 11:00:00 via INTRAVENOUS
  Filled 2019-01-10: qty 250

## 2019-01-13 DIAGNOSIS — R0981 Nasal congestion: Secondary | ICD-10-CM | POA: Diagnosis not present

## 2019-01-13 DIAGNOSIS — D500 Iron deficiency anemia secondary to blood loss (chronic): Secondary | ICD-10-CM | POA: Diagnosis not present

## 2019-01-13 DIAGNOSIS — E785 Hyperlipidemia, unspecified: Secondary | ICD-10-CM | POA: Diagnosis not present

## 2019-01-13 DIAGNOSIS — E1122 Type 2 diabetes mellitus with diabetic chronic kidney disease: Secondary | ICD-10-CM | POA: Diagnosis not present

## 2019-01-13 DIAGNOSIS — I129 Hypertensive chronic kidney disease with stage 1 through stage 4 chronic kidney disease, or unspecified chronic kidney disease: Secondary | ICD-10-CM | POA: Diagnosis not present

## 2019-01-13 DIAGNOSIS — E1149 Type 2 diabetes mellitus with other diabetic neurological complication: Secondary | ICD-10-CM | POA: Diagnosis not present

## 2019-01-13 DIAGNOSIS — N189 Chronic kidney disease, unspecified: Secondary | ICD-10-CM | POA: Diagnosis not present

## 2019-01-13 DIAGNOSIS — Z87891 Personal history of nicotine dependence: Secondary | ICD-10-CM | POA: Diagnosis not present

## 2019-01-13 DIAGNOSIS — N183 Chronic kidney disease, stage 3 (moderate): Secondary | ICD-10-CM | POA: Diagnosis not present

## 2019-01-13 DIAGNOSIS — D649 Anemia, unspecified: Secondary | ICD-10-CM | POA: Diagnosis not present

## 2019-01-13 DIAGNOSIS — R05 Cough: Secondary | ICD-10-CM | POA: Diagnosis not present

## 2019-01-13 DIAGNOSIS — M545 Low back pain: Secondary | ICD-10-CM | POA: Diagnosis not present

## 2019-01-13 DIAGNOSIS — R430 Anosmia: Secondary | ICD-10-CM | POA: Diagnosis not present

## 2019-01-13 DIAGNOSIS — Z951 Presence of aortocoronary bypass graft: Secondary | ICD-10-CM | POA: Diagnosis not present

## 2019-01-13 DIAGNOSIS — I2510 Atherosclerotic heart disease of native coronary artery without angina pectoris: Secondary | ICD-10-CM | POA: Diagnosis not present

## 2019-01-13 DIAGNOSIS — K5900 Constipation, unspecified: Secondary | ICD-10-CM | POA: Diagnosis not present

## 2019-01-15 ENCOUNTER — Inpatient Hospital Stay: Payer: Medicare Other

## 2019-01-15 ENCOUNTER — Other Ambulatory Visit: Payer: Self-pay

## 2019-01-15 VITALS — BP 157/76 | HR 56 | Temp 96.3°F | Resp 18

## 2019-01-15 DIAGNOSIS — Z79899 Other long term (current) drug therapy: Secondary | ICD-10-CM | POA: Diagnosis not present

## 2019-01-15 DIAGNOSIS — D500 Iron deficiency anemia secondary to blood loss (chronic): Secondary | ICD-10-CM

## 2019-01-15 DIAGNOSIS — D509 Iron deficiency anemia, unspecified: Secondary | ICD-10-CM | POA: Diagnosis not present

## 2019-01-15 MED ORDER — SODIUM CHLORIDE 0.9 % IV SOLN
510.0000 mg | Freq: Once | INTRAVENOUS | Status: AC
  Administered 2019-01-15: 510 mg via INTRAVENOUS
  Filled 2019-01-15: qty 17

## 2019-01-15 MED ORDER — SODIUM CHLORIDE 0.9 % IV SOLN
Freq: Once | INTRAVENOUS | Status: AC
  Administered 2019-01-15: 14:00:00 via INTRAVENOUS
  Filled 2019-01-15: qty 250

## 2019-01-15 NOTE — Progress Notes (Signed)
Pt is here for Dose two of feraheme. He states "last Friday" he developed a cold and has had dizziness along with it. Pt states its normal to get dizzy but "not this bad" pt states it is "better". Pt reports seeing PCP on monday of this week. and had covid test and labs done. Covid was negative and HGB was 7.3 per Pt report. Pt states symptoms "got the worse on Sunday and that is why I saw Dr. Ola Spurr on Monday"  Dr. Janese Banks aware and per Dr. Janese Banks okay to proceed with [facility], pt to continue to follow up with PCP, no further orders at this time. Pt aware and agrees with plan.   1412: Pt denies to stay for 30 minutes post infusion observation. Pt tolerated infusion well. Pt and VS stable at discharge, no s/s of reaction, pt denies any complaints at this time.

## 2019-01-20 DIAGNOSIS — N2581 Secondary hyperparathyroidism of renal origin: Secondary | ICD-10-CM | POA: Diagnosis not present

## 2019-01-20 DIAGNOSIS — I10 Essential (primary) hypertension: Secondary | ICD-10-CM | POA: Diagnosis not present

## 2019-01-20 DIAGNOSIS — E875 Hyperkalemia: Secondary | ICD-10-CM | POA: Diagnosis not present

## 2019-01-20 DIAGNOSIS — N184 Chronic kidney disease, stage 4 (severe): Secondary | ICD-10-CM | POA: Diagnosis not present

## 2019-01-20 DIAGNOSIS — D631 Anemia in chronic kidney disease: Secondary | ICD-10-CM | POA: Diagnosis not present

## 2019-01-20 DIAGNOSIS — E1122 Type 2 diabetes mellitus with diabetic chronic kidney disease: Secondary | ICD-10-CM | POA: Diagnosis not present

## 2019-02-03 DIAGNOSIS — D500 Iron deficiency anemia secondary to blood loss (chronic): Secondary | ICD-10-CM | POA: Diagnosis not present

## 2019-02-03 DIAGNOSIS — E1142 Type 2 diabetes mellitus with diabetic polyneuropathy: Secondary | ICD-10-CM | POA: Diagnosis not present

## 2019-02-03 DIAGNOSIS — I2510 Atherosclerotic heart disease of native coronary artery without angina pectoris: Secondary | ICD-10-CM | POA: Diagnosis not present

## 2019-02-03 DIAGNOSIS — Z87891 Personal history of nicotine dependence: Secondary | ICD-10-CM | POA: Diagnosis not present

## 2019-02-03 DIAGNOSIS — H8110 Benign paroxysmal vertigo, unspecified ear: Secondary | ICD-10-CM | POA: Diagnosis not present

## 2019-02-03 DIAGNOSIS — M545 Low back pain: Secondary | ICD-10-CM | POA: Diagnosis not present

## 2019-02-03 DIAGNOSIS — I10 Essential (primary) hypertension: Secondary | ICD-10-CM | POA: Diagnosis not present

## 2019-02-28 ENCOUNTER — Encounter: Payer: Self-pay | Admitting: Oncology

## 2019-02-28 ENCOUNTER — Other Ambulatory Visit: Payer: Self-pay

## 2019-03-03 ENCOUNTER — Other Ambulatory Visit: Payer: Self-pay

## 2019-03-03 ENCOUNTER — Inpatient Hospital Stay (HOSPITAL_BASED_OUTPATIENT_CLINIC_OR_DEPARTMENT_OTHER): Payer: Medicare Other | Admitting: Oncology

## 2019-03-03 ENCOUNTER — Inpatient Hospital Stay: Payer: Medicare Other | Attending: Oncology

## 2019-03-03 VITALS — BP 125/63 | HR 70 | Temp 97.5°F | Wt 206.0 lb

## 2019-03-03 DIAGNOSIS — D500 Iron deficiency anemia secondary to blood loss (chronic): Secondary | ICD-10-CM

## 2019-03-03 DIAGNOSIS — Z794 Long term (current) use of insulin: Secondary | ICD-10-CM | POA: Insufficient documentation

## 2019-03-03 DIAGNOSIS — N189 Chronic kidney disease, unspecified: Secondary | ICD-10-CM | POA: Insufficient documentation

## 2019-03-03 DIAGNOSIS — E785 Hyperlipidemia, unspecified: Secondary | ICD-10-CM | POA: Insufficient documentation

## 2019-03-03 DIAGNOSIS — K219 Gastro-esophageal reflux disease without esophagitis: Secondary | ICD-10-CM | POA: Insufficient documentation

## 2019-03-03 DIAGNOSIS — Z85828 Personal history of other malignant neoplasm of skin: Secondary | ICD-10-CM | POA: Insufficient documentation

## 2019-03-03 DIAGNOSIS — Z7982 Long term (current) use of aspirin: Secondary | ICD-10-CM | POA: Insufficient documentation

## 2019-03-03 DIAGNOSIS — E213 Hyperparathyroidism, unspecified: Secondary | ICD-10-CM | POA: Insufficient documentation

## 2019-03-03 DIAGNOSIS — E1122 Type 2 diabetes mellitus with diabetic chronic kidney disease: Secondary | ICD-10-CM | POA: Diagnosis not present

## 2019-03-03 DIAGNOSIS — M1990 Unspecified osteoarthritis, unspecified site: Secondary | ICD-10-CM | POA: Diagnosis not present

## 2019-03-03 DIAGNOSIS — I129 Hypertensive chronic kidney disease with stage 1 through stage 4 chronic kidney disease, or unspecified chronic kidney disease: Secondary | ICD-10-CM | POA: Insufficient documentation

## 2019-03-03 DIAGNOSIS — Z79899 Other long term (current) drug therapy: Secondary | ICD-10-CM | POA: Diagnosis not present

## 2019-03-03 DIAGNOSIS — Z87891 Personal history of nicotine dependence: Secondary | ICD-10-CM | POA: Insufficient documentation

## 2019-03-03 DIAGNOSIS — D631 Anemia in chronic kidney disease: Secondary | ICD-10-CM

## 2019-03-03 DIAGNOSIS — D509 Iron deficiency anemia, unspecified: Secondary | ICD-10-CM

## 2019-03-03 LAB — CBC
HCT: 28.8 % — ABNORMAL LOW (ref 39.0–52.0)
Hemoglobin: 9.6 g/dL — ABNORMAL LOW (ref 13.0–17.0)
MCH: 30.8 pg (ref 26.0–34.0)
MCHC: 33.3 g/dL (ref 30.0–36.0)
MCV: 92.3 fL (ref 80.0–100.0)
Platelets: 208 10*3/uL (ref 150–400)
RBC: 3.12 MIL/uL — ABNORMAL LOW (ref 4.22–5.81)
RDW: 14.7 % (ref 11.5–15.5)
WBC: 6.5 10*3/uL (ref 4.0–10.5)
nRBC: 0 % (ref 0.0–0.2)

## 2019-03-03 LAB — IRON AND TIBC
Iron: 58 ug/dL (ref 45–182)
Saturation Ratios: 24 % (ref 17.9–39.5)
TIBC: 245 ug/dL — ABNORMAL LOW (ref 250–450)
UIBC: 187 ug/dL

## 2019-03-03 LAB — FERRITIN: Ferritin: 26 ng/mL (ref 24–336)

## 2019-03-03 NOTE — Progress Notes (Signed)
Patient stated that he had been doing well. 

## 2019-03-04 NOTE — Progress Notes (Signed)
Hematology/Oncology Consult note [facility]  Telephone:([phone] [phone]  Patient Care Team: Leonel Ramsay, MD as PCP - General (Infectious Diseases) Bary Castilla, Forest Gleason, MD (General Surgery) Leonel Ramsay, MD (Infectious Diseases) Anthonette Legato, MD (Internal Medicine)   Name of the patient: Roger Knight  [id number]  [birth year]   Date of visit: 03/04/19  Diagnosis-anemia likely multifactorial secondary to iron deficiency and anemia of chronic kidney disease  Chief complaint/ Reason for visit-routine follow-up of anemia  Heme/Onc history: Patient is a 68 year old male with a past medical history significant for hypertension, hyperparathyroidism and staged chronic kidney disease. He has been referred to [place] for evaluation and management of anemia. Most recent CBC from 05/19/2016 showed white count of 7.9, H&H of 9.4/29 with an MCV of 78 and a platelet count of 33. Prior CBC from May 2017 revealed H&H of 12/35.8. Patient also had a recent CBC on 06/12/2016 which showed an H&H of 8.1/26.3 with an MCV of 79.2. Ferritin from October 2017 was low normal at 33.Patient does have a history of hemorrhoids and reports on and off rectal bleeding. Denies any frequent use of NSAIDs. He has had a complete GI evaluation in the past including capsule endoscopy which did not reveal any source of bleeding. Patient has been hospitalized a couple of times and a past for iron deficiency anemia requiring blood transfusion.Patient had an EGD colonoscopy in January 2017. EGD showed a nonbleeding gastric polyp which was biopsied and colonoscopy showed internal and external hemorrhoids. The cause of his GI bleed in the past has been attributed to his hemorrhoids. Patient was on oral iron for about a year or so but stopped taking it in summer of 2017. Currently patient feels well and denies any symptoms of fever, shortness of breath on exertion or chest pain.  Patient also has chronic kidney disease but has not received any EPO shots in the past  Patient hasbeen needing intermittent doses of Feraheme.  Interval history-patient has been having hemorrhoidal bleeding more often.  His hemoglobin dropped down to 7.7 in September 2020 and he needed 2 more doses of Feraheme.  He will also be seeing Dr. Bary Castilla soon  ECOG PS- 1 Pain scale- 0 Opioid associated constipation- no  Review of systems- Review of Systems  Constitutional: Positive for malaise/fatigue. Negative for chills, fever and weight loss.  HENT: Negative for congestion, ear discharge and nosebleeds.   Eyes: Negative for blurred vision.  Respiratory: Negative for cough, hemoptysis, sputum production, shortness of breath and wheezing.   Cardiovascular: Negative for chest pain, palpitations, orthopnea and claudication.  Gastrointestinal: Positive for blood in stool. Negative for abdominal pain, constipation, diarrhea, heartburn, melena, nausea and vomiting.  Genitourinary: Negative for dysuria, flank pain, frequency, hematuria and urgency.  Musculoskeletal: Negative for back pain, joint pain and myalgias.  Skin: Negative for rash.  Neurological: Negative for dizziness, tingling, focal weakness, seizures, weakness and headaches.  Endo/Heme/Allergies: Does not bruise/bleed easily.  Psychiatric/Behavioral: Negative for depression and suicidal ideas. The patient does not have insomnia.       No Known Allergies   Past Medical History:  Diagnosis Date  . Accelerating angina ([place]) 02/11/2014  . Anemia   . Arthritis   . Carcinoma ([place]) 2012    skin cancer on neck [facility]  . Chronic kidney disease 2016  . Diabetes mellitus without complication ([place])   . GERD (gastroesophageal reflux disease)   . Hemorrhoids 2016-17  . Hernia   . Hyperlipidemia   .  Hypertension   . Skin cancer      Past Surgical History:  Procedure Laterality Date  . COLONOSCOPY  April 2014  . COLONOSCOPY N/A  05/13/2015   Procedure: COLONOSCOPY;  Surgeon: Josefine Class, MD;  Location: [facility] ENDOSCOPY;  Service: Endoscopy;  Laterality: N/A;  . CORONARY ARTERY BYPASS GRAFT  1996   [facility]  . CORONARY STENT PLACEMENT  2011   [facility]  . ESOPHAGOGASTRODUODENOSCOPY (EGD) WITH PROPOFOL N/A 05/13/2015   Procedure: ESOPHAGOGASTRODUODENOSCOPY (EGD) WITH PROPOFOL;  Surgeon: Josefine Class, MD;  Location: [facility] ENDOSCOPY;  Service: Endoscopy;  Laterality: N/A;  . UPPER GI ENDOSCOPY  2014    Social History   Socioeconomic History  . Marital status: Married    Spouse name: Not on file  . Number of children: Not on file  . Years of education: Not on file  . Highest education level: Not on file  Occupational History  . Not on file  Social Needs  . Financial resource strain: Not on file  . Food insecurity    Worry: Not on file    Inability: Not on file  . Transportation needs    Medical: Not on file    Non-medical: Not on file  Tobacco Use  . Smoking status: Former Smoker    Types: Cigarettes    Quit date: 04/24/2005    Years since quitting: 13.8  . Smokeless tobacco: Former Systems developer    Types: Chew  Substance and Sexual Activity  . Alcohol use: No  . Drug use: No  . Sexual activity: Yes  Lifestyle  . Physical activity    Days per week: Not on file    Minutes per session: Not on file  . Stress: Not on file  Relationships  . Social Herbalist on phone: Not on file    Gets together: Not on file    Attends religious service: Not on file    Active member of club or organization: Not on file    Attends meetings of clubs or organizations: Not on file    Relationship status: Not on file  . Intimate partner violence    Fear of current or ex partner: Not on file    Emotionally abused: Not on file    Physically abused: Not on file    Forced sexual activity: Not on file  Other Topics Concern  . Not on file  Social History Narrative  . Not on file    Family History  Problem  Relation Age of Onset  . Dementia Mother 67  . Osteoarthritis Mother   . Cancer Mother      Current Outpatient Medications:  .  amLODipine (NORVASC) 5 MG tablet, Take 5 mg by mouth daily. , Disp: , Rfl:  .  aspirin 81 MG tablet, Take 81 mg by mouth daily., Disp: , Rfl:  .  atorvastatin (LIPITOR) 80 MG tablet, Take 80 mg by mouth daily., Disp: , Rfl:  .  calcitRIOL (ROCALTROL) 0.25 MCG capsule, Take 0.25 mcg by mouth daily. , Disp: , Rfl:  .  cetirizine (ZYRTEC) 10 MG tablet, Take 10 mg by mouth daily., Disp: , Rfl:  .  ezetimibe (ZETIA) 10 MG tablet, Take 10 mg by mouth daily., Disp: , Rfl:  .  FIBER FORMULA PO, Take 2 tablets by mouth 2 (two) times daily. , Disp: , Rfl:  .  gabapentin (NEURONTIN) 300 MG capsule, Take 300 mg by mouth at bedtime., Disp: , Rfl:  .  insulin  aspart (NOVOLOG) 100 UNIT/ML injection, Inject 20 Units into the skin 3 (three) times daily. , Disp: , Rfl:  .  Insulin Pen Needle (FIFTY50 PEN NEEDLES) 32G X 4 MM MISC, USE WITH INJECTIONS 4 TIMES DAILY, Disp: , Rfl:  .  metoprolol (TOPROL-XL) 200 MG 24 hr tablet, Take 200 mg by mouth daily., Disp: , Rfl:  .  Multiple Vitamins-Minerals (MULTIVITAMIN WITH MINERALS) tablet, Take 1 tablet by mouth daily., Disp: , Rfl:  .  pantoprazole (PROTONIX) 20 MG tablet, Take 40 mg by mouth 2 (two) times daily before a meal. , Disp: , Rfl:  .  sildenafil (VIAGRA) 100 MG tablet, Take 100 mg by mouth as needed for erectile dysfunction. Reported on 06/14/2015, Disp: , Rfl:  .  spironolactone (ALDACTONE) 25 MG tablet, Take 25 mg by mouth 2 (two) times daily., Disp: , Rfl:  .  TRESIBA FLEXTOUCH 200 UNIT/ML SOPN, Inject 90 Units into the skin daily. , Disp: , Rfl:  .  valsartan-hydrochlorothiazide (DIOVAN-HCT) 320-25 MG per tablet, Take 1 tablet by mouth daily., Disp: , Rfl:   Physical exam:  Vitals:   03/03/19 1112  BP: 125/63  Pulse: 70  Temp: (!) 97.5 F (36.4 C)  TempSrc: Tympanic  Weight: 206 lb (93.4 kg)   Physical Exam  Constitutional:      General: He is not in acute distress. HENT:     Head: Normocephalic and atraumatic.  Eyes:     Pupils: Pupils are equal, round, and reactive to light.  Neck:     Musculoskeletal: Normal range of motion.  Cardiovascular:     Rate and Rhythm: Normal rate and regular rhythm.     Heart sounds: Normal heart sounds.  Pulmonary:     Effort: Pulmonary effort is normal.     Breath sounds: Normal breath sounds.  Abdominal:     General: Bowel sounds are normal.     Palpations: Abdomen is soft.  Skin:    General: Skin is warm and dry.  Neurological:     Mental Status: He is alert and oriented to person, place, and time.      CMP Latest Ref Rng & Units 06/15/2016  Glucose 65 - 99 mg/dL 137(H)  BUN 6 - 20 mg/dL 24(H)  Creatinine 0.61 - 1.24 mg/dL 1.97(H)  Sodium 135 - 145 mmol/L 134(L)  Potassium 3.5 - 5.1 mmol/L 4.4  Chloride 101 - 111 mmol/L 103  CO2 22 - 32 mmol/L 24  Calcium 8.9 - 10.3 mg/dL 9.2  Total Protein 6.5 - 8.1 g/dL 7.3  Total Bilirubin 0.3 - 1.2 mg/dL 0.5  Alkaline Phos 38 - 126 U/L 59  AST 15 - 41 U/L 22  ALT 17 - 63 U/L 30   CBC Latest Ref Rng & Units 03/03/2019  WBC 4.0 - 10.5 K/uL 6.5  Hemoglobin 13.0 - 17.0 g/dL 9.6(L)  Hematocrit 39.0 - 52.0 % 28.8(L)  Platelets 150 - 400 K/uL 208      Assessment and plan- Patient is a 68 y.o. male with iron deficiency anemia and anemia of chronic kidney disease here for routine follow-up  After 2 doses of Feraheme patient's hemoglobin is improved from 7.7-9.6. However his ferritin levels are still low at 26.  I therefore recommend 2 more doses of Feraheme at this time.  He has received that in the past without any significant side effects.  If there is no significant improvement in his anemia despite correcting his iron is studies I will consider EPO shots given his  chronic kidney disease if his hemoglobin is significantly less than 10.  Patient verbalized understanding  Repeat CBC ferritin and iron  studies in 3 months and I will see him in 3 months   Visit Diagnosis 1. Iron deficiency anemia due to chronic blood loss   2. Anemia in chronic kidney disease, unspecified CKD stage      Dr. Randa Evens, MD, MPH [facility] [phone] 03/04/2019 12:15 PM

## 2019-03-07 DIAGNOSIS — Z23 Encounter for immunization: Secondary | ICD-10-CM | POA: Diagnosis not present

## 2019-03-26 ENCOUNTER — Other Ambulatory Visit: Payer: Self-pay

## 2019-03-26 ENCOUNTER — Inpatient Hospital Stay: Payer: Medicare Other | Attending: Oncology

## 2019-03-26 VITALS — BP 156/57 | HR 58 | Temp 96.0°F | Resp 18

## 2019-03-26 DIAGNOSIS — D500 Iron deficiency anemia secondary to blood loss (chronic): Secondary | ICD-10-CM | POA: Insufficient documentation

## 2019-03-26 DIAGNOSIS — Z79899 Other long term (current) drug therapy: Secondary | ICD-10-CM | POA: Diagnosis not present

## 2019-03-26 MED ORDER — SODIUM CHLORIDE 0.9 % IV SOLN
510.0000 mg | Freq: Once | INTRAVENOUS | Status: AC
  Administered 2019-03-26: 510 mg via INTRAVENOUS
  Filled 2019-03-26: qty 17

## 2019-03-26 MED ORDER — SODIUM CHLORIDE 0.9 % IV SOLN
Freq: Once | INTRAVENOUS | Status: AC
  Administered 2019-03-26: 14:00:00 via INTRAVENOUS
  Filled 2019-03-26: qty 250

## 2019-03-27 DIAGNOSIS — E1122 Type 2 diabetes mellitus with diabetic chronic kidney disease: Secondary | ICD-10-CM | POA: Diagnosis not present

## 2019-03-27 DIAGNOSIS — I10 Essential (primary) hypertension: Secondary | ICD-10-CM | POA: Diagnosis not present

## 2019-03-27 DIAGNOSIS — E875 Hyperkalemia: Secondary | ICD-10-CM | POA: Diagnosis not present

## 2019-03-27 DIAGNOSIS — D631 Anemia in chronic kidney disease: Secondary | ICD-10-CM | POA: Diagnosis not present

## 2019-03-27 DIAGNOSIS — N2581 Secondary hyperparathyroidism of renal origin: Secondary | ICD-10-CM | POA: Diagnosis not present

## 2019-03-27 DIAGNOSIS — N184 Chronic kidney disease, stage 4 (severe): Secondary | ICD-10-CM | POA: Diagnosis not present

## 2019-04-01 ENCOUNTER — Other Ambulatory Visit: Payer: Self-pay

## 2019-04-02 ENCOUNTER — Other Ambulatory Visit: Payer: Self-pay

## 2019-04-02 ENCOUNTER — Inpatient Hospital Stay: Payer: Medicare Other

## 2019-04-02 VITALS — BP 158/62 | HR 59 | Temp 96.2°F | Resp 18

## 2019-04-02 DIAGNOSIS — D500 Iron deficiency anemia secondary to blood loss (chronic): Secondary | ICD-10-CM | POA: Diagnosis not present

## 2019-04-02 MED ORDER — SODIUM CHLORIDE 0.9 % IV SOLN
510.0000 mg | Freq: Once | INTRAVENOUS | Status: AC
  Administered 2019-04-02: 510 mg via INTRAVENOUS
  Filled 2019-04-02: qty 17

## 2019-04-02 MED ORDER — SODIUM CHLORIDE 0.9 % IV SOLN
Freq: Once | INTRAVENOUS | Status: AC
  Administered 2019-04-02: 14:00:00 via INTRAVENOUS
  Filled 2019-04-02: qty 250

## 2019-04-02 NOTE — Progress Notes (Signed)
Pt tolerated Feraheme infusion well. Pt monitored 30 minutes post infusion. Pt denies any concerns or complaints. No s/s of distress noted. Pt and VS stable at discharge.

## 2019-05-19 DIAGNOSIS — N184 Chronic kidney disease, stage 4 (severe): Secondary | ICD-10-CM | POA: Diagnosis not present

## 2019-05-19 DIAGNOSIS — E1122 Type 2 diabetes mellitus with diabetic chronic kidney disease: Secondary | ICD-10-CM | POA: Diagnosis not present

## 2019-05-26 DIAGNOSIS — I10 Essential (primary) hypertension: Secondary | ICD-10-CM | POA: Diagnosis not present

## 2019-05-26 DIAGNOSIS — E1122 Type 2 diabetes mellitus with diabetic chronic kidney disease: Secondary | ICD-10-CM | POA: Diagnosis not present

## 2019-05-26 DIAGNOSIS — N184 Chronic kidney disease, stage 4 (severe): Secondary | ICD-10-CM | POA: Diagnosis not present

## 2019-05-26 DIAGNOSIS — N2581 Secondary hyperparathyroidism of renal origin: Secondary | ICD-10-CM | POA: Diagnosis not present

## 2019-05-26 DIAGNOSIS — E875 Hyperkalemia: Secondary | ICD-10-CM | POA: Diagnosis not present

## 2019-05-26 DIAGNOSIS — D631 Anemia in chronic kidney disease: Secondary | ICD-10-CM | POA: Diagnosis not present

## 2019-06-02 ENCOUNTER — Inpatient Hospital Stay: Payer: Medicare Other | Attending: Oncology

## 2019-06-02 ENCOUNTER — Other Ambulatory Visit: Payer: Self-pay

## 2019-06-02 DIAGNOSIS — K219 Gastro-esophageal reflux disease without esophagitis: Secondary | ICD-10-CM | POA: Diagnosis not present

## 2019-06-02 DIAGNOSIS — Z79899 Other long term (current) drug therapy: Secondary | ICD-10-CM | POA: Insufficient documentation

## 2019-06-02 DIAGNOSIS — Z7982 Long term (current) use of aspirin: Secondary | ICD-10-CM | POA: Diagnosis not present

## 2019-06-02 DIAGNOSIS — E785 Hyperlipidemia, unspecified: Secondary | ICD-10-CM | POA: Diagnosis not present

## 2019-06-02 DIAGNOSIS — Z794 Long term (current) use of insulin: Secondary | ICD-10-CM | POA: Diagnosis not present

## 2019-06-02 DIAGNOSIS — D509 Iron deficiency anemia, unspecified: Secondary | ICD-10-CM | POA: Diagnosis not present

## 2019-06-02 DIAGNOSIS — E119 Type 2 diabetes mellitus without complications: Secondary | ICD-10-CM | POA: Diagnosis not present

## 2019-06-02 DIAGNOSIS — N183 Chronic kidney disease, stage 3 unspecified: Secondary | ICD-10-CM | POA: Diagnosis not present

## 2019-06-02 DIAGNOSIS — I129 Hypertensive chronic kidney disease with stage 1 through stage 4 chronic kidney disease, or unspecified chronic kidney disease: Secondary | ICD-10-CM | POA: Insufficient documentation

## 2019-06-02 DIAGNOSIS — Z87891 Personal history of nicotine dependence: Secondary | ICD-10-CM | POA: Insufficient documentation

## 2019-06-02 DIAGNOSIS — D631 Anemia in chronic kidney disease: Secondary | ICD-10-CM | POA: Insufficient documentation

## 2019-06-02 DIAGNOSIS — R5383 Other fatigue: Secondary | ICD-10-CM | POA: Insufficient documentation

## 2019-06-02 DIAGNOSIS — Z85828 Personal history of other malignant neoplasm of skin: Secondary | ICD-10-CM | POA: Insufficient documentation

## 2019-06-02 DIAGNOSIS — D500 Iron deficiency anemia secondary to blood loss (chronic): Secondary | ICD-10-CM

## 2019-06-02 LAB — CBC WITH DIFFERENTIAL/PLATELET
Abs Immature Granulocytes: 0.04 10*3/uL (ref 0.00–0.07)
Basophils Absolute: 0.1 10*3/uL (ref 0.0–0.1)
Basophils Relative: 1 %
Eosinophils Absolute: 0.2 10*3/uL (ref 0.0–0.5)
Eosinophils Relative: 2 %
HCT: 26.8 % — ABNORMAL LOW (ref 39.0–52.0)
Hemoglobin: 8.9 g/dL — ABNORMAL LOW (ref 13.0–17.0)
Immature Granulocytes: 0 %
Lymphocytes Relative: 12 %
Lymphs Abs: 1.2 10*3/uL (ref 0.7–4.0)
MCH: 31.4 pg (ref 26.0–34.0)
MCHC: 33.2 g/dL (ref 30.0–36.0)
MCV: 94.7 fL (ref 80.0–100.0)
Monocytes Absolute: 0.8 10*3/uL (ref 0.1–1.0)
Monocytes Relative: 8 %
Neutro Abs: 7.5 10*3/uL (ref 1.7–7.7)
Neutrophils Relative %: 77 %
Platelets: 235 10*3/uL (ref 150–400)
RBC: 2.83 MIL/uL — ABNORMAL LOW (ref 4.22–5.81)
RDW: 13.8 % (ref 11.5–15.5)
WBC: 9.9 10*3/uL (ref 4.0–10.5)
nRBC: 0 % (ref 0.0–0.2)

## 2019-06-02 LAB — IRON AND TIBC
Iron: 73 ug/dL (ref 45–182)
Saturation Ratios: 27 % (ref 17.9–39.5)
TIBC: 272 ug/dL (ref 250–450)
UIBC: 199 ug/dL

## 2019-06-02 LAB — FERRITIN: Ferritin: 59 ng/mL (ref 24–336)

## 2019-06-03 ENCOUNTER — Encounter: Payer: Self-pay | Admitting: Oncology

## 2019-06-03 ENCOUNTER — Inpatient Hospital Stay: Payer: Medicare Other

## 2019-06-03 ENCOUNTER — Inpatient Hospital Stay (HOSPITAL_BASED_OUTPATIENT_CLINIC_OR_DEPARTMENT_OTHER): Payer: Medicare Other | Admitting: Oncology

## 2019-06-03 DIAGNOSIS — D509 Iron deficiency anemia, unspecified: Secondary | ICD-10-CM | POA: Diagnosis not present

## 2019-06-03 DIAGNOSIS — D631 Anemia in chronic kidney disease: Secondary | ICD-10-CM | POA: Diagnosis not present

## 2019-06-03 DIAGNOSIS — N183 Chronic kidney disease, stage 3 unspecified: Secondary | ICD-10-CM

## 2019-06-03 NOTE — Progress Notes (Signed)
Pt was having bleeding from hemorrhoids and was sent back to [facility] and he has done changing of meds to have bowel movements and it is getting better, they have discussed about surgery for them but would like to wait til covid is better. He feels about the same- chugging along doing ok

## 2019-06-06 NOTE — Progress Notes (Signed)
I connected with Roger Knight on 06/06/19 at 11:30 AM EST by video enabled telemedicine visit and verified that I am speaking with the correct person using two identifiers.   I discussed the limitations, risks, security and privacy concerns of performing an evaluation and management service by telemedicine and the availability of in-person appointments. I also discussed with the patient that there may be a patient responsible charge related to this service. The patient expressed understanding and agreed to proceed.  Other persons participating in the visit and their role in the encounter:  none  Patient's location:  home Provider's location:  Work  Diagnosis-anemia likely multifactorial secondary to iron deficiency and anemia of chronic kidney disease  Chief Complaint: Routine follow-up of anemia  History of present illness: Patient is a 69 year old male with a past medical history significant for hypertension, hyperparathyroidism and staged chronic kidney disease. He has been referred to [place] for evaluation and management of anemia. Most recent CBC from 05/19/2016 showed white count of 7.9, H&H of 9.4/29 with an MCV of 78 and a platelet count of 33. Prior CBC from May 2017 revealed H&H of 12/35.8. Patient also had a recent CBC on 06/12/2016 which showed an H&H of 8.1/26.3 with an MCV of 79.2. Ferritin from October 2017 was low normal at 33.Patient does have a history of hemorrhoids and reports on and off rectal bleeding. Denies any frequent use of NSAIDs. He has had a complete GI evaluation in the past including capsule endoscopy which did not reveal any source of bleeding. Patient has been hospitalized a couple of times and a past for iron deficiency anemia requiring blood transfusion.Patient had an EGD colonoscopy in January 2017. EGD showed a nonbleeding gastric polyp which was biopsied and colonoscopy showed internal and external hemorrhoids. The cause of his GI bleed in the past has been  attributed to his hemorrhoids. Patient was on oral iron for about a year or so but stopped taking it in summer of 2017. Currently patient feels well and denies any symptoms of fever, shortness of breath on exertion or chest pain. Patient also has chronic kidney disease but has not received any EPO shots in the past  Patient hasbeen needing intermittent doses of Feraheme.  He has not required EPO yet   Interval history : He still gets intermittent hemorrhoidal bleeding.  Reports some fatigue but denies other complaints   Review of Systems  Constitutional: Positive for malaise/fatigue. Negative for chills, fever and weight loss.  HENT: Negative for congestion, ear discharge and nosebleeds.   Eyes: Negative for blurred vision.  Respiratory: Negative for cough, hemoptysis, sputum production, shortness of breath and wheezing.   Cardiovascular: Negative for chest pain, palpitations, orthopnea and claudication.  Gastrointestinal: Positive for blood in stool. Negative for abdominal pain, constipation, diarrhea, heartburn, melena, nausea and vomiting.  Genitourinary: Negative for dysuria, flank pain, frequency, hematuria and urgency.  Musculoskeletal: Negative for back pain, joint pain and myalgias.  Skin: Negative for rash.  Neurological: Negative for dizziness, tingling, focal weakness, seizures, weakness and headaches.  Endo/Heme/Allergies: Does not bruise/bleed easily.  Psychiatric/Behavioral: Negative for depression and suicidal ideas. The patient does not have insomnia.     No Known Allergies  Past Medical History:  Diagnosis Date  . Accelerating angina ([place]) 02/11/2014  . Anemia   . Arthritis   . Carcinoma ([place]) 2012    skin cancer on neck [facility]  . Chronic kidney disease 2016  . Diabetes mellitus without complication ([place])   . GERD (gastroesophageal reflux  disease)   . Hemorrhoids 2016-17  . Hernia   . Hyperlipidemia   . Hypertension   . Skin cancer     Past Surgical History:   Procedure Laterality Date  . COLONOSCOPY  April 2014  . COLONOSCOPY N/A 05/13/2015   Procedure: COLONOSCOPY;  Surgeon: Josefine Class, MD;  Location: [facility] ENDOSCOPY;  Service: Endoscopy;  Laterality: N/A;  . CORONARY ARTERY BYPASS GRAFT  1996   [facility]  . CORONARY STENT PLACEMENT  2011   [facility]  . ESOPHAGOGASTRODUODENOSCOPY (EGD) WITH PROPOFOL N/A 05/13/2015   Procedure: ESOPHAGOGASTRODUODENOSCOPY (EGD) WITH PROPOFOL;  Surgeon: Josefine Class, MD;  Location: [facility] ENDOSCOPY;  Service: Endoscopy;  Laterality: N/A;  . UPPER GI ENDOSCOPY  2014    Social History   Socioeconomic History  . Marital status: Married    Spouse name: Not on file  . Number of children: Not on file  . Years of education: Not on file  . Highest education level: Not on file  Occupational History  . Not on file  Tobacco Use  . Smoking status: Former Smoker    Types: Cigarettes    Quit date: 04/24/2005    Years since quitting: 14.1  . Smokeless tobacco: Former Systems developer    Types: Chew  Substance and Sexual Activity  . Alcohol use: No  . Drug use: No  . Sexual activity: Yes  Other Topics Concern  . Not on file  Social History Narrative  . Not on file   Social Determinants of Health   Financial Resource Strain:   . Difficulty of Paying Living Expenses: Not on file  Food Insecurity:   . Worried About Charity fundraiser in the Last Year: Not on file  . Ran Out of Food in the Last Year: Not on file  Transportation Needs:   . Lack of Transportation (Medical): Not on file  . Lack of Transportation (Non-Medical): Not on file  Physical Activity:   . Days of Exercise per Week: Not on file  . Minutes of Exercise per Session: Not on file  Stress:   . Feeling of Stress : Not on file  Social Connections:   . Frequency of Communication with Friends and Family: Not on file  . Frequency of Social Gatherings with Friends and Family: Not on file  . Attends Religious Services: Not on file  . Active Member of  Clubs or Organizations: Not on file  . Attends Archivist Meetings: Not on file  . Marital Status: Not on file  Intimate Partner Violence:   . Fear of Current or Ex-Partner: Not on file  . Emotionally Abused: Not on file  . Physically Abused: Not on file  . Sexually Abused: Not on file    Family History  Problem Relation Age of Onset  . Dementia Mother 65  . Osteoarthritis Mother   . Cancer Mother      Current Outpatient Medications:  .  amLODipine (NORVASC) 5 MG tablet, Take 5 mg by mouth daily. , Disp: , Rfl:  .  aspirin 81 MG tablet, Take 81 mg by mouth daily., Disp: , Rfl:  .  atorvastatin (LIPITOR) 80 MG tablet, Take 80 mg by mouth daily., Disp: , Rfl:  .  calcitRIOL (ROCALTROL) 0.25 MCG capsule, Take 0.25 mcg by mouth daily. , Disp: , Rfl:  .  cetirizine (ZYRTEC) 10 MG tablet, Take 10 mg by mouth daily., Disp: , Rfl:  .  ezetimibe (ZETIA) 10 MG tablet, Take 10 mg by mouth  daily., Disp: , Rfl:  .  FIBER FORMULA PO, Take 2 tablets by mouth 2 (two) times daily. , Disp: , Rfl:  .  gabapentin (NEURONTIN) 300 MG capsule, Take 300 mg by mouth at bedtime., Disp: , Rfl:  .  insulin aspart (NOVOLOG) 100 UNIT/ML injection, Inject 20 Units into the skin 3 (three) times daily. , Disp: , Rfl:  .  Insulin Pen Needle (FIFTY50 PEN NEEDLES) 32G X 4 MM MISC, USE WITH INJECTIONS 4 TIMES DAILY, Disp: , Rfl:  .  metoprolol (TOPROL-XL) 200 MG 24 hr tablet, Take 200 mg by mouth daily., Disp: , Rfl:  .  Multiple Vitamins-Minerals (MULTIVITAMIN WITH MINERALS) tablet, Take 1 tablet by mouth daily., Disp: , Rfl:  .  pantoprazole (PROTONIX) 20 MG tablet, Take 40 mg by mouth 2 (two) times daily before a meal. , Disp: , Rfl:  .  sildenafil (VIAGRA) 100 MG tablet, Take 100 mg by mouth as needed for erectile dysfunction. Reported on 06/14/2015, Disp: , Rfl:  .  spironolactone (ALDACTONE) 25 MG tablet, Take 25 mg by mouth 2 (two) times daily., Disp: , Rfl:  .  TRESIBA FLEXTOUCH 200 UNIT/ML SOPN,  Inject 90 Units into the skin daily. , Disp: , Rfl:  .  valsartan-hydrochlorothiazide (DIOVAN-HCT) 320-25 MG per tablet, Take 1 tablet by mouth daily., Disp: , Rfl:   No results found.  No images are attached to the encounter.   CMP Latest Ref Rng & Units 06/15/2016  Glucose 65 - 99 mg/dL 137(H)  BUN 6 - 20 mg/dL 24(H)  Creatinine 0.61 - 1.24 mg/dL 1.97(H)  Sodium 135 - 145 mmol/L 134(L)  Potassium 3.5 - 5.1 mmol/L 4.4  Chloride 101 - 111 mmol/L 103  CO2 22 - 32 mmol/L 24  Calcium 8.9 - 10.3 mg/dL 9.2  Total Protein 6.5 - 8.1 g/dL 7.3  Total Bilirubin 0.3 - 1.2 mg/dL 0.5  Alkaline Phos 38 - 126 U/L 59  AST 15 - 41 U/L 22  ALT 17 - 63 U/L 30   CBC Latest Ref Rng & Units 06/02/2019  WBC 4.0 - 10.5 K/uL 9.9  Hemoglobin 13.0 - 17.0 g/dL 8.9(L)  Hematocrit 39.0 - 52.0 % 26.8(L)  Platelets 150 - 400 K/uL 235     Observation/objective: Appears in no acute distress on video visit today.  Breathing is nonlabored  Assessment and plan: Patient is a 69 year old male with normocytic anemia multifactorial secondary to anemia of chronic kidney disease as well as iron deficiency  Patient's hemoglobin has been between 9-10 for the last 2 years.  His most recent hemoglobin is somewhat lower at 8.9.  Iron studies show iron saturation of 27% and ferritin of 59.  He has had prior work-up for anemia which was consistent with iron deficiency and anemia of chronic kidney disease.  He does follow-up with nephrology as well.  Most recent serum creatinine was 3.0.  I discussed about getting his ferritin closer to 200 given his CKD before considering EPO for the patient.  Discussed risks and benefits of EPO including all but not limited to risk of thromboembolic side effects when hemoglobin is raised higher. Goal would be to keep his hemoglobin between 10-11.  Patient would like to hold off on starting EPO at this time.  Patient will continue with oral iron at this time and I will repeat CBC ferritin iron  studies B12 and folate, TSH and CMP in 3 months and I will see him back in 3 months for a video visit.  I will check an interim CBC in 1 month and if it continues to be low I will first bring him in for IV iron.  Patient verbalized understanding  Follow-up instructions: As above  I discussed the assessment and treatment plan with the patient. The patient was provided an opportunity to ask questions and all were answered. The patient agreed with the plan and demonstrated an understanding of the instructions.   The patient was advised to call back or seek an in-person evaluation if the symptoms worsen or if the condition fails to improve as anticipated.    Visit Diagnosis: 1. Anemia of chronic kidney failure, stage 3 (moderate)   2. Iron deficiency anemia, unspecified iron deficiency anemia type     Dr. Randa Evens, MD, MPH [facility] at [facility] Tel- [phone] 06/06/2019 10:43 AM

## 2019-06-30 DIAGNOSIS — E1142 Type 2 diabetes mellitus with diabetic polyneuropathy: Secondary | ICD-10-CM | POA: Diagnosis not present

## 2019-06-30 DIAGNOSIS — I2510 Atherosclerotic heart disease of native coronary artery without angina pectoris: Secondary | ICD-10-CM | POA: Diagnosis not present

## 2019-06-30 DIAGNOSIS — D500 Iron deficiency anemia secondary to blood loss (chronic): Secondary | ICD-10-CM | POA: Diagnosis not present

## 2019-06-30 DIAGNOSIS — E785 Hyperlipidemia, unspecified: Secondary | ICD-10-CM | POA: Diagnosis not present

## 2019-07-01 ENCOUNTER — Other Ambulatory Visit: Payer: Self-pay

## 2019-07-01 ENCOUNTER — Inpatient Hospital Stay: Payer: Medicare Other | Attending: Oncology

## 2019-07-01 DIAGNOSIS — Z79899 Other long term (current) drug therapy: Secondary | ICD-10-CM | POA: Insufficient documentation

## 2019-07-01 DIAGNOSIS — D500 Iron deficiency anemia secondary to blood loss (chronic): Secondary | ICD-10-CM | POA: Diagnosis not present

## 2019-07-01 DIAGNOSIS — D631 Anemia in chronic kidney disease: Secondary | ICD-10-CM

## 2019-07-01 LAB — CBC WITH DIFFERENTIAL/PLATELET
Abs Immature Granulocytes: 0.03 10*3/uL (ref 0.00–0.07)
Basophils Absolute: 0.1 10*3/uL (ref 0.0–0.1)
Basophils Relative: 1 %
Eosinophils Absolute: 0.1 10*3/uL (ref 0.0–0.5)
Eosinophils Relative: 2 %
HCT: 25.8 % — ABNORMAL LOW (ref 39.0–52.0)
Hemoglobin: 8.4 g/dL — ABNORMAL LOW (ref 13.0–17.0)
Immature Granulocytes: 1 %
Lymphocytes Relative: 19 %
Lymphs Abs: 1.3 10*3/uL (ref 0.7–4.0)
MCH: 30.8 pg (ref 26.0–34.0)
MCHC: 32.6 g/dL (ref 30.0–36.0)
MCV: 94.5 fL (ref 80.0–100.0)
Monocytes Absolute: 0.7 10*3/uL (ref 0.1–1.0)
Monocytes Relative: 10 %
Neutro Abs: 4.4 10*3/uL (ref 1.7–7.7)
Neutrophils Relative %: 67 %
Platelets: 215 10*3/uL (ref 150–400)
RBC: 2.73 MIL/uL — ABNORMAL LOW (ref 4.22–5.81)
RDW: 13.7 % (ref 11.5–15.5)
WBC: 6.5 10*3/uL (ref 4.0–10.5)
nRBC: 0 % (ref 0.0–0.2)

## 2019-07-01 LAB — IRON AND TIBC
Iron: 56 ug/dL (ref 45–182)
Saturation Ratios: 21 % (ref 17.9–39.5)
TIBC: 273 ug/dL (ref 250–450)
UIBC: 217 ug/dL

## 2019-07-01 LAB — FERRITIN: Ferritin: 28 ng/mL (ref 24–336)

## 2019-07-02 ENCOUNTER — Other Ambulatory Visit: Payer: Self-pay | Admitting: Oncology

## 2019-07-02 ENCOUNTER — Telehealth: Payer: Self-pay | Admitting: Oncology

## 2019-07-02 NOTE — Telephone Encounter (Signed)
Writer was informed that patient was anemic and that MD had ordered IV iron for patient. Iron infusions have been scheduled. Writer phoned and left voicemail informing patient of appts.

## 2019-07-07 DIAGNOSIS — Z951 Presence of aortocoronary bypass graft: Secondary | ICD-10-CM | POA: Diagnosis not present

## 2019-07-07 DIAGNOSIS — Z794 Long term (current) use of insulin: Secondary | ICD-10-CM | POA: Diagnosis not present

## 2019-07-07 DIAGNOSIS — Z87891 Personal history of nicotine dependence: Secondary | ICD-10-CM | POA: Diagnosis not present

## 2019-07-07 DIAGNOSIS — I2510 Atherosclerotic heart disease of native coronary artery without angina pectoris: Secondary | ICD-10-CM | POA: Diagnosis not present

## 2019-07-07 DIAGNOSIS — D649 Anemia, unspecified: Secondary | ICD-10-CM | POA: Diagnosis not present

## 2019-07-07 DIAGNOSIS — E1122 Type 2 diabetes mellitus with diabetic chronic kidney disease: Secondary | ICD-10-CM | POA: Diagnosis not present

## 2019-07-07 DIAGNOSIS — E785 Hyperlipidemia, unspecified: Secondary | ICD-10-CM | POA: Diagnosis not present

## 2019-07-07 DIAGNOSIS — N189 Chronic kidney disease, unspecified: Secondary | ICD-10-CM | POA: Diagnosis not present

## 2019-07-07 DIAGNOSIS — I129 Hypertensive chronic kidney disease with stage 1 through stage 4 chronic kidney disease, or unspecified chronic kidney disease: Secondary | ICD-10-CM | POA: Diagnosis not present

## 2019-07-07 DIAGNOSIS — K5900 Constipation, unspecified: Secondary | ICD-10-CM | POA: Diagnosis not present

## 2019-07-09 ENCOUNTER — Inpatient Hospital Stay: Payer: Medicare Other

## 2019-07-09 ENCOUNTER — Other Ambulatory Visit: Payer: Self-pay

## 2019-07-09 VITALS — BP 158/85 | HR 59 | Temp 96.4°F | Resp 18

## 2019-07-09 DIAGNOSIS — D500 Iron deficiency anemia secondary to blood loss (chronic): Secondary | ICD-10-CM

## 2019-07-09 DIAGNOSIS — Z79899 Other long term (current) drug therapy: Secondary | ICD-10-CM | POA: Diagnosis not present

## 2019-07-09 MED ORDER — SODIUM CHLORIDE 0.9 % IV SOLN
510.0000 mg | Freq: Once | INTRAVENOUS | Status: AC
  Administered 2019-07-09: 12:00:00 510 mg via INTRAVENOUS
  Filled 2019-07-09: qty 510

## 2019-07-09 MED ORDER — SODIUM CHLORIDE 0.9 % IV SOLN
Freq: Once | INTRAVENOUS | Status: AC
  Filled 2019-07-09: qty 250

## 2019-07-14 DIAGNOSIS — Z23 Encounter for immunization: Secondary | ICD-10-CM | POA: Diagnosis not present

## 2019-07-16 ENCOUNTER — Inpatient Hospital Stay: Payer: Medicare Other

## 2019-07-16 ENCOUNTER — Other Ambulatory Visit: Payer: Self-pay

## 2019-07-16 VITALS — BP 158/89 | HR 56 | Temp 96.9°F | Resp 18

## 2019-07-16 DIAGNOSIS — D500 Iron deficiency anemia secondary to blood loss (chronic): Secondary | ICD-10-CM

## 2019-07-16 DIAGNOSIS — Z79899 Other long term (current) drug therapy: Secondary | ICD-10-CM | POA: Diagnosis not present

## 2019-07-16 MED ORDER — SODIUM CHLORIDE 0.9 % IV SOLN
510.0000 mg | Freq: Once | INTRAVENOUS | Status: AC
  Administered 2019-07-16: 12:00:00 510 mg via INTRAVENOUS
  Filled 2019-07-16: qty 510

## 2019-07-16 MED ORDER — SODIUM CHLORIDE 0.9 % IV SOLN
Freq: Once | INTRAVENOUS | Status: AC
  Filled 2019-07-16: qty 250

## 2019-07-21 DIAGNOSIS — N184 Chronic kidney disease, stage 4 (severe): Secondary | ICD-10-CM | POA: Diagnosis not present

## 2019-07-21 DIAGNOSIS — E1122 Type 2 diabetes mellitus with diabetic chronic kidney disease: Secondary | ICD-10-CM | POA: Diagnosis not present

## 2019-07-28 DIAGNOSIS — N184 Chronic kidney disease, stage 4 (severe): Secondary | ICD-10-CM | POA: Diagnosis not present

## 2019-07-28 DIAGNOSIS — E875 Hyperkalemia: Secondary | ICD-10-CM | POA: Diagnosis not present

## 2019-07-28 DIAGNOSIS — I10 Essential (primary) hypertension: Secondary | ICD-10-CM | POA: Diagnosis not present

## 2019-07-28 DIAGNOSIS — E1122 Type 2 diabetes mellitus with diabetic chronic kidney disease: Secondary | ICD-10-CM | POA: Diagnosis not present

## 2019-07-28 DIAGNOSIS — D631 Anemia in chronic kidney disease: Secondary | ICD-10-CM | POA: Diagnosis not present

## 2019-07-28 DIAGNOSIS — N2581 Secondary hyperparathyroidism of renal origin: Secondary | ICD-10-CM | POA: Diagnosis not present

## 2019-08-11 DIAGNOSIS — Z23 Encounter for immunization: Secondary | ICD-10-CM | POA: Diagnosis not present

## 2019-09-01 ENCOUNTER — Other Ambulatory Visit: Payer: Self-pay

## 2019-09-01 ENCOUNTER — Inpatient Hospital Stay: Payer: Medicare Other | Attending: Oncology

## 2019-09-01 DIAGNOSIS — D631 Anemia in chronic kidney disease: Secondary | ICD-10-CM | POA: Diagnosis not present

## 2019-09-01 DIAGNOSIS — D509 Iron deficiency anemia, unspecified: Secondary | ICD-10-CM | POA: Insufficient documentation

## 2019-09-01 DIAGNOSIS — N183 Chronic kidney disease, stage 3 unspecified: Secondary | ICD-10-CM

## 2019-09-01 DIAGNOSIS — I129 Hypertensive chronic kidney disease with stage 1 through stage 4 chronic kidney disease, or unspecified chronic kidney disease: Secondary | ICD-10-CM | POA: Insufficient documentation

## 2019-09-01 DIAGNOSIS — N189 Chronic kidney disease, unspecified: Secondary | ICD-10-CM | POA: Diagnosis not present

## 2019-09-01 LAB — COMPREHENSIVE METABOLIC PANEL
ALT: 31 U/L (ref 0–44)
AST: 22 U/L (ref 15–41)
Albumin: 3.3 g/dL — ABNORMAL LOW (ref 3.5–5.0)
Alkaline Phosphatase: 59 U/L (ref 38–126)
Anion gap: 9 (ref 5–15)
BUN: 46 mg/dL — ABNORMAL HIGH (ref 8–23)
CO2: 21 mmol/L — ABNORMAL LOW (ref 22–32)
Calcium: 8.2 mg/dL — ABNORMAL LOW (ref 8.9–10.3)
Chloride: 106 mmol/L (ref 98–111)
Creatinine, Ser: 3.67 mg/dL — ABNORMAL HIGH (ref 0.61–1.24)
GFR calc Af Amer: 19 mL/min — ABNORMAL LOW (ref >60)
GFR calc non Af Amer: 16 mL/min — ABNORMAL LOW (ref >60)
Glucose, Bld: 188 mg/dL — ABNORMAL HIGH (ref 70–99)
Potassium: 5.1 mmol/L (ref 3.5–5.1)
Sodium: 136 mmol/L (ref 135–145)
Total Bilirubin: 0.5 mg/dL (ref 0.3–1.2)
Total Protein: 6.6 g/dL (ref 6.5–8.1)

## 2019-09-01 LAB — CBC WITH DIFFERENTIAL/PLATELET
Abs Immature Granulocytes: 0.03 10*3/uL (ref 0.00–0.07)
Basophils Absolute: 0.1 10*3/uL (ref 0.0–0.1)
Basophils Relative: 1 %
Eosinophils Absolute: 0.1 10*3/uL (ref 0.0–0.5)
Eosinophils Relative: 2 %
HCT: 28.6 % — ABNORMAL LOW (ref 39.0–52.0)
Hemoglobin: 9.7 g/dL — ABNORMAL LOW (ref 13.0–17.0)
Immature Granulocytes: 1 %
Lymphocytes Relative: 16 %
Lymphs Abs: 1 10*3/uL (ref 0.7–4.0)
MCH: 31.1 pg (ref 26.0–34.0)
MCHC: 33.9 g/dL (ref 30.0–36.0)
MCV: 91.7 fL (ref 80.0–100.0)
Monocytes Absolute: 0.6 10*3/uL (ref 0.1–1.0)
Monocytes Relative: 9 %
Neutro Abs: 4.7 10*3/uL (ref 1.7–7.7)
Neutrophils Relative %: 71 %
Platelets: 202 10*3/uL (ref 150–400)
RBC: 3.12 MIL/uL — ABNORMAL LOW (ref 4.22–5.81)
RDW: 13.2 % (ref 11.5–15.5)
WBC: 6.5 10*3/uL (ref 4.0–10.5)
nRBC: 0 % (ref 0.0–0.2)

## 2019-09-01 LAB — VITAMIN B12: Vitamin B-12: 340 pg/mL (ref 180–914)

## 2019-09-01 LAB — IRON AND TIBC
Iron: 82 ug/dL (ref 45–182)
Saturation Ratios: 28 % (ref 17.9–39.5)
TIBC: 294 ug/dL (ref 250–450)
UIBC: 212 ug/dL

## 2019-09-01 LAB — FERRITIN: Ferritin: 75 ng/mL (ref 24–336)

## 2019-09-01 LAB — TSH: TSH: 2.753 u[IU]/mL (ref 0.350–4.500)

## 2019-09-01 LAB — FOLATE: Folate: 32 ng/mL (ref >5.9)

## 2019-09-02 ENCOUNTER — Encounter: Payer: Self-pay | Admitting: Oncology

## 2019-09-02 ENCOUNTER — Inpatient Hospital Stay (HOSPITAL_BASED_OUTPATIENT_CLINIC_OR_DEPARTMENT_OTHER): Payer: Medicare Other | Admitting: Oncology

## 2019-09-02 DIAGNOSIS — D631 Anemia in chronic kidney disease: Secondary | ICD-10-CM | POA: Diagnosis not present

## 2019-09-02 DIAGNOSIS — N183 Chronic kidney disease, stage 3 unspecified: Secondary | ICD-10-CM

## 2019-09-02 DIAGNOSIS — D500 Iron deficiency anemia secondary to blood loss (chronic): Secondary | ICD-10-CM

## 2019-09-02 NOTE — Progress Notes (Signed)
Patient called for virtual oncology follow-up appointment, expresses no complaints or concerns at this time.

## 2019-09-05 NOTE — Progress Notes (Signed)
I connected with Roger Knight on 09/05/19 at  2:15 PM EDT by video enabled telemedicine visit and verified that I am speaking with the correct person using two identifiers.   I discussed the limitations, risks, security and privacy concerns of performing an evaluation and management service by telemedicine and the availability of in-person appointments. I also discussed with the patient that there may be a patient responsible charge related to this service. The patient expressed understanding and agreed to proceed.  Other persons participating in the visit and their role in the encounter:  none  Patient's location:  home Provider's location:  work  Risk analyst Complaint: Routine follow-up of anemia which is multifactorial secondary to iron deficiency and anemia of chronic kidney disease  History of present illness: Patient is a 69 year old male with a past medical history significant for hypertension, hyperparathyroidism and staged chronic kidney disease. He has been referred to [place] for evaluation and management of anemia. Most recent CBC from 05/19/2016 showed white count of 7.9, H&H of 9.4/29 with an MCV of 78 and a platelet count of 33. Prior CBC from May 2017 revealed H&H of 12/35.8. Patient also had a recent CBC on 06/12/2016 which showed an H&H of 8.1/26.3 with an MCV of 79.2. Ferritin from October 2017 was low normal at 33.Patient does have a history of hemorrhoids and reports on and off rectal bleeding. Denies any frequent use of NSAIDs. He has had a complete GI evaluation in the past including capsule endoscopy which did not reveal any source of bleeding. Patient has been hospitalized a couple of times and a past for iron deficiency anemia requiring blood transfusion.Patient had an EGD colonoscopy in January 2017. EGD showed a nonbleeding gastric polyp which was biopsied and colonoscopy showed internal and external hemorrhoids. The cause of his GI bleed in the past has been attributed to his  hemorrhoids. Patient was on oral iron for about a year or so but stopped taking it in summer of 2017. Currently patient feels well and denies any symptoms of fever, shortness of breath on exertion or chest pain. Patient also has chronic kidney disease but has not received any EPO shots in the past  Patient hasbeen needing intermittent doses of Feraheme.  He has not required EPO yet  Interval history: Currently patient reports feeling well overall.  He has not had any major episodes of hemorrhoidal bleeding.  Energy levels are stable.   Review of Systems  Constitutional: Negative for chills, fever, malaise/fatigue and weight loss.  HENT: Negative for congestion, ear discharge and nosebleeds.   Eyes: Negative for blurred vision.  Respiratory: Negative for cough, hemoptysis, sputum production, shortness of breath and wheezing.   Cardiovascular: Negative for chest pain, palpitations, orthopnea and claudication.  Gastrointestinal: Negative for abdominal pain, blood in stool, constipation, diarrhea, heartburn, melena, nausea and vomiting.  Genitourinary: Negative for dysuria, flank pain, frequency, hematuria and urgency.  Musculoskeletal: Negative for back pain, joint pain and myalgias.  Skin: Negative for rash.  Neurological: Negative for dizziness, tingling, focal weakness, seizures, weakness and headaches.  Endo/Heme/Allergies: Does not bruise/bleed easily.  Psychiatric/Behavioral: Negative for depression and suicidal ideas. The patient does not have insomnia.     No Known Allergies  Past Medical History:  Diagnosis Date  . Accelerating angina ([place]) 02/11/2014  . Anemia   . Arthritis   . Carcinoma ([place]) 2012    skin cancer on neck [facility]  . Chronic kidney disease 2016  . Diabetes mellitus without complication ([place])   . GERD (  gastroesophageal reflux disease)   . Hemorrhoids 2016-17  . Hernia   . Hyperlipidemia   . Hypertension   . Skin cancer     Past Surgical History:  Procedure  Laterality Date  . COLONOSCOPY  April 2014  . COLONOSCOPY N/A 05/13/2015   Procedure: COLONOSCOPY;  Surgeon: Josefine Class, MD;  Location: [facility] ENDOSCOPY;  Service: Endoscopy;  Laterality: N/A;  . CORONARY ARTERY BYPASS GRAFT  1996   [facility]  . CORONARY STENT PLACEMENT  2011   [facility]  . ESOPHAGOGASTRODUODENOSCOPY (EGD) WITH PROPOFOL N/A 05/13/2015   Procedure: ESOPHAGOGASTRODUODENOSCOPY (EGD) WITH PROPOFOL;  Surgeon: Josefine Class, MD;  Location: [facility] ENDOSCOPY;  Service: Endoscopy;  Laterality: N/A;  . UPPER GI ENDOSCOPY  2014    Social History   Socioeconomic History  . Marital status: Married    Spouse name: Not on file  . Number of children: Not on file  . Years of education: Not on file  . Highest education level: Not on file  Occupational History  . Not on file  Tobacco Use  . Smoking status: Former Smoker    Types: Cigarettes    Quit date: 04/24/2005    Years since quitting: 14.3  . Smokeless tobacco: Former Systems developer    Types: Chew  Substance and Sexual Activity  . Alcohol use: No  . Drug use: No  . Sexual activity: Yes  Other Topics Concern  . Not on file  Social History Narrative  . Not on file   Social Determinants of Health   Financial Resource Strain:   . Difficulty of Paying Living Expenses:   Food Insecurity:   . Worried About Charity fundraiser in the Last Year:   . Arboriculturist in the Last Year:   Transportation Needs:   . Film/video editor (Medical):   Marland Kitchen Lack of Transportation (Non-Medical):   Physical Activity:   . Days of Exercise per Week:   . Minutes of Exercise per Session:   Stress:   . Feeling of Stress :   Social Connections:   . Frequency of Communication with Friends and Family:   . Frequency of Social Gatherings with Friends and Family:   . Attends Religious Services:   . Active Member of Clubs or Organizations:   . Attends Archivist Meetings:   Marland Kitchen Marital Status:   Intimate Partner Violence:   . Fear of  Current or Ex-Partner:   . Emotionally Abused:   Marland Kitchen Physically Abused:   . Sexually Abused:     Family History  Problem Relation Age of Onset  . Dementia Mother 29  . Osteoarthritis Mother   . Cancer Mother      Current Outpatient Medications:  .  amLODipine (NORVASC) 5 MG tablet, Take 5 mg by mouth daily. , Disp: , Rfl:  .  aspirin 81 MG tablet, Take 81 mg by mouth daily., Disp: , Rfl:  .  atorvastatin (LIPITOR) 80 MG tablet, Take 80 mg by mouth daily., Disp: , Rfl:  .  calcitRIOL (ROCALTROL) 0.25 MCG capsule, Take 0.25 mcg by mouth daily. , Disp: , Rfl:  .  cetirizine (ZYRTEC) 10 MG tablet, Take 10 mg by mouth daily., Disp: , Rfl:  .  ezetimibe (ZETIA) 10 MG tablet, Take 10 mg by mouth daily., Disp: , Rfl:  .  FIBER FORMULA PO, Take 2 tablets by mouth 2 (two) times daily. , Disp: , Rfl:  .  gabapentin (NEURONTIN) 300 MG capsule, Take 300 mg  by mouth at bedtime., Disp: , Rfl:  .  insulin aspart (NOVOLOG) 100 UNIT/ML injection, Inject 20 Units into the skin 3 (three) times daily. , Disp: , Rfl:  .  Insulin Pen Needle (FIFTY50 PEN NEEDLES) 32G X 4 MM MISC, USE WITH INJECTIONS 4 TIMES DAILY, Disp: , Rfl:  .  metoprolol (TOPROL-XL) 200 MG 24 hr tablet, Take 200 mg by mouth daily., Disp: , Rfl:  .  Multiple Vitamins-Minerals (MULTIVITAMIN WITH MINERALS) tablet, Take 1 tablet by mouth daily., Disp: , Rfl:  .  pantoprazole (PROTONIX) 20 MG tablet, Take 40 mg by mouth 2 (two) times daily before a meal. , Disp: , Rfl:  .  sildenafil (VIAGRA) 100 MG tablet, Take 100 mg by mouth as needed for erectile dysfunction. Reported on 06/14/2015, Disp: , Rfl:  .  spironolactone (ALDACTONE) 25 MG tablet, Take 25 mg by mouth 2 (two) times daily., Disp: , Rfl:  .  TRESIBA FLEXTOUCH 200 UNIT/ML SOPN, Inject 90 Units into the skin daily. , Disp: , Rfl:  .  valsartan-hydrochlorothiazide (DIOVAN-HCT) 320-25 MG per tablet, Take 1 tablet by mouth daily., Disp: , Rfl:   No results found.  No images are attached  to the encounter.   CMP Latest Ref Rng & Units 09/01/2019  Glucose 70 - 99 mg/dL 188(H)  BUN 8 - 23 mg/dL 46(H)  Creatinine 0.61 - 1.24 mg/dL 3.67(H)  Sodium 135 - 145 mmol/L 136  Potassium 3.5 - 5.1 mmol/L 5.1  Chloride 98 - 111 mmol/L 106  CO2 22 - 32 mmol/L 21(L)  Calcium 8.9 - 10.3 mg/dL 8.2(L)  Total Protein 6.5 - 8.1 g/dL 6.6  Total Bilirubin 0.3 - 1.2 mg/dL 0.5  Alkaline Phos 38 - 126 U/L 59  AST 15 - 41 U/L 22  ALT 0 - 44 U/L 31   CBC Latest Ref Rng & Units 09/01/2019  WBC 4.0 - 10.5 K/uL 6.5  Hemoglobin 13.0 - 17.0 g/dL 9.7(L)  Hematocrit 39.0 - 52.0 % 28.6(L)  Platelets 150 - 400 K/uL 202    Assessment and plan: Patient is a 69 year old male with history of iron deficiency anemia as well as component of anemia of chronic kidney disease  Patient's hemoglobin is currently stable around 9-10.He has been requiring intermittent doses of Feraheme.  At this time his iron studies are normal and ferritin is 75 which is improved as compared to 28 2 months ago.  He did receive 2 doses of Feraheme in March 2021.  Since his hemoglobin is closer to 10 I will not start EPO at this time.  I will consider it if his hemoglobin is consistently less than 9 despite giving IV iron.  Repeat CBC ferritin and iron studies in 3 in 6 months and I will see him in 6 months in person.   Patient does have worsening kidney numbers and his creatinine is up to 3.6 today as compared to his baseline of around 2.  Patient does follow-up with nephrology for this  Follow-up instructions: As above  I discussed the assessment and treatment plan with the patient. The patient was provided an opportunity to ask questions and all were answered. The patient agreed with the plan and demonstrated an understanding of the instructions.   The patient was advised to call back or seek an in-person evaluation if the symptoms worsen or if the condition fails to improve as anticipated.    Visit Diagnosis: 1. Iron  deficiency anemia due to chronic blood loss   2. Anemia  of chronic kidney failure, stage 3 (moderate)     Dr. Randa Evens, MD, MPH [facility] at [facility] Tel- [phone] 09/05/2019 8:36 AM

## 2019-09-23 DIAGNOSIS — N184 Chronic kidney disease, stage 4 (severe): Secondary | ICD-10-CM | POA: Diagnosis not present

## 2019-09-23 DIAGNOSIS — E1122 Type 2 diabetes mellitus with diabetic chronic kidney disease: Secondary | ICD-10-CM | POA: Diagnosis not present

## 2019-09-29 DIAGNOSIS — N2581 Secondary hyperparathyroidism of renal origin: Secondary | ICD-10-CM | POA: Diagnosis not present

## 2019-09-29 DIAGNOSIS — E1122 Type 2 diabetes mellitus with diabetic chronic kidney disease: Secondary | ICD-10-CM | POA: Diagnosis not present

## 2019-09-29 DIAGNOSIS — N184 Chronic kidney disease, stage 4 (severe): Secondary | ICD-10-CM | POA: Diagnosis not present

## 2019-09-29 DIAGNOSIS — I10 Essential (primary) hypertension: Secondary | ICD-10-CM | POA: Diagnosis not present

## 2019-09-29 DIAGNOSIS — D631 Anemia in chronic kidney disease: Secondary | ICD-10-CM | POA: Diagnosis not present

## 2019-09-29 DIAGNOSIS — E875 Hyperkalemia: Secondary | ICD-10-CM | POA: Diagnosis not present

## 2019-11-03 DIAGNOSIS — N184 Chronic kidney disease, stage 4 (severe): Secondary | ICD-10-CM | POA: Diagnosis not present

## 2019-11-03 DIAGNOSIS — E1122 Type 2 diabetes mellitus with diabetic chronic kidney disease: Secondary | ICD-10-CM | POA: Diagnosis not present

## 2019-11-10 DIAGNOSIS — Z0000 Encounter for general adult medical examination without abnormal findings: Secondary | ICD-10-CM | POA: Diagnosis not present

## 2019-11-10 DIAGNOSIS — Z794 Long term (current) use of insulin: Secondary | ICD-10-CM | POA: Diagnosis not present

## 2019-11-10 DIAGNOSIS — D649 Anemia, unspecified: Secondary | ICD-10-CM | POA: Diagnosis not present

## 2019-11-10 DIAGNOSIS — E875 Hyperkalemia: Secondary | ICD-10-CM | POA: Diagnosis not present

## 2019-11-10 DIAGNOSIS — D631 Anemia in chronic kidney disease: Secondary | ICD-10-CM | POA: Diagnosis not present

## 2019-11-10 DIAGNOSIS — N2581 Secondary hyperparathyroidism of renal origin: Secondary | ICD-10-CM | POA: Diagnosis not present

## 2019-11-10 DIAGNOSIS — I10 Essential (primary) hypertension: Secondary | ICD-10-CM | POA: Diagnosis not present

## 2019-11-10 DIAGNOSIS — N189 Chronic kidney disease, unspecified: Secondary | ICD-10-CM | POA: Diagnosis not present

## 2019-11-10 DIAGNOSIS — N184 Chronic kidney disease, stage 4 (severe): Secondary | ICD-10-CM | POA: Diagnosis not present

## 2019-11-10 DIAGNOSIS — E1122 Type 2 diabetes mellitus with diabetic chronic kidney disease: Secondary | ICD-10-CM | POA: Diagnosis not present

## 2019-11-10 DIAGNOSIS — I129 Hypertensive chronic kidney disease with stage 1 through stage 4 chronic kidney disease, or unspecified chronic kidney disease: Secondary | ICD-10-CM | POA: Diagnosis not present

## 2019-11-20 DIAGNOSIS — H2513 Age-related nuclear cataract, bilateral: Secondary | ICD-10-CM | POA: Diagnosis not present

## 2019-12-03 ENCOUNTER — Other Ambulatory Visit: Payer: Self-pay

## 2019-12-03 ENCOUNTER — Inpatient Hospital Stay: Payer: Medicare Other | Attending: Oncology

## 2019-12-03 DIAGNOSIS — D500 Iron deficiency anemia secondary to blood loss (chronic): Secondary | ICD-10-CM | POA: Diagnosis not present

## 2019-12-03 DIAGNOSIS — Z79899 Other long term (current) drug therapy: Secondary | ICD-10-CM | POA: Diagnosis not present

## 2019-12-03 LAB — CBC
HCT: 24.7 % — ABNORMAL LOW (ref 39.0–52.0)
Hemoglobin: 8.8 g/dL — ABNORMAL LOW (ref 13.0–17.0)
MCH: 32.1 pg (ref 26.0–34.0)
MCHC: 35.6 g/dL (ref 30.0–36.0)
MCV: 90.1 fL (ref 80.0–100.0)
Platelets: 188 10*3/uL (ref 150–400)
RBC: 2.74 MIL/uL — ABNORMAL LOW (ref 4.22–5.81)
RDW: 13.4 % (ref 11.5–15.5)
WBC: 6.3 10*3/uL (ref 4.0–10.5)
nRBC: 0 % (ref 0.0–0.2)

## 2019-12-03 LAB — IRON AND TIBC
Iron: 81 ug/dL (ref 45–182)
Saturation Ratios: 31 % (ref 17.9–39.5)
TIBC: 259 ug/dL (ref 250–450)
UIBC: 178 ug/dL

## 2019-12-03 LAB — FERRITIN: Ferritin: 40 ng/mL (ref 24–336)

## 2019-12-04 NOTE — Progress Notes (Signed)
Called pt and left voicemail that dr Janese Banks feels that pt needs iron tx. . In the past he has got feraheme and per insurance person - he can have feraheme. I let him know that hgb 8.8 and ferritin is 47. Asked if he can call me and let me know if it is ok with him to get more iron and if there are any days that he can't come in. Then I will set it up and let pt know

## 2019-12-05 ENCOUNTER — Telehealth: Payer: Self-pay | Admitting: Oncology

## 2019-12-05 ENCOUNTER — Telehealth: Payer: Self-pay | Admitting: *Deleted

## 2019-12-05 NOTE — Telephone Encounter (Signed)
12/05/2019  Called and confirmed day and time for 2 iron infusions w/ Pt per Judeen Hammans. He is scheduled for 8/18 @ 1:30 and 5/25 @ 1:30.  SRW

## 2019-12-10 ENCOUNTER — Other Ambulatory Visit: Payer: Self-pay

## 2019-12-10 ENCOUNTER — Other Ambulatory Visit: Payer: Self-pay | Admitting: Oncology

## 2019-12-10 ENCOUNTER — Inpatient Hospital Stay: Payer: Medicare Other

## 2019-12-10 VITALS — BP 146/52 | HR 63 | Temp 95.8°F | Resp 18

## 2019-12-10 DIAGNOSIS — D500 Iron deficiency anemia secondary to blood loss (chronic): Secondary | ICD-10-CM | POA: Diagnosis not present

## 2019-12-10 DIAGNOSIS — Z79899 Other long term (current) drug therapy: Secondary | ICD-10-CM | POA: Diagnosis not present

## 2019-12-10 MED ORDER — SODIUM CHLORIDE 0.9 % IV SOLN
Freq: Once | INTRAVENOUS | Status: AC
  Filled 2019-12-10: qty 250

## 2019-12-10 MED ORDER — SODIUM CHLORIDE 0.9 % IV SOLN
510.0000 mg | Freq: Once | INTRAVENOUS | Status: AC
  Administered 2019-12-10: 510 mg via INTRAVENOUS
  Filled 2019-12-10: qty 510

## 2019-12-10 NOTE — Progress Notes (Signed)
Pt tolerated infusion well. No s/s of distress or reaction noted. Pt and VS stable at discharge.  

## 2019-12-24 ENCOUNTER — Other Ambulatory Visit: Payer: Self-pay

## 2019-12-24 ENCOUNTER — Inpatient Hospital Stay: Payer: Medicare Other | Attending: Oncology

## 2019-12-24 VITALS — BP 139/78 | HR 60 | Temp 95.0°F

## 2019-12-24 DIAGNOSIS — Z79899 Other long term (current) drug therapy: Secondary | ICD-10-CM | POA: Diagnosis not present

## 2019-12-24 DIAGNOSIS — D500 Iron deficiency anemia secondary to blood loss (chronic): Secondary | ICD-10-CM

## 2019-12-24 DIAGNOSIS — D509 Iron deficiency anemia, unspecified: Secondary | ICD-10-CM | POA: Diagnosis not present

## 2019-12-24 MED ORDER — SODIUM CHLORIDE 0.9 % IV SOLN
510.0000 mg | Freq: Once | INTRAVENOUS | Status: AC
  Administered 2019-12-24: 510 mg via INTRAVENOUS
  Filled 2019-12-24: qty 510

## 2019-12-24 MED ORDER — SODIUM CHLORIDE 0.9 % IV SOLN
Freq: Once | INTRAVENOUS | Status: AC
  Filled 2019-12-24: qty 250

## 2020-01-05 DIAGNOSIS — N184 Chronic kidney disease, stage 4 (severe): Secondary | ICD-10-CM | POA: Diagnosis not present

## 2020-01-05 DIAGNOSIS — E1122 Type 2 diabetes mellitus with diabetic chronic kidney disease: Secondary | ICD-10-CM | POA: Diagnosis not present

## 2020-01-12 DIAGNOSIS — E1122 Type 2 diabetes mellitus with diabetic chronic kidney disease: Secondary | ICD-10-CM | POA: Diagnosis not present

## 2020-01-12 DIAGNOSIS — N184 Chronic kidney disease, stage 4 (severe): Secondary | ICD-10-CM | POA: Diagnosis not present

## 2020-01-12 DIAGNOSIS — R809 Proteinuria, unspecified: Secondary | ICD-10-CM | POA: Diagnosis not present

## 2020-01-12 DIAGNOSIS — D631 Anemia in chronic kidney disease: Secondary | ICD-10-CM | POA: Diagnosis not present

## 2020-01-12 DIAGNOSIS — I10 Essential (primary) hypertension: Secondary | ICD-10-CM | POA: Diagnosis not present

## 2020-01-12 DIAGNOSIS — N2581 Secondary hyperparathyroidism of renal origin: Secondary | ICD-10-CM | POA: Diagnosis not present

## 2020-02-16 DIAGNOSIS — N184 Chronic kidney disease, stage 4 (severe): Secondary | ICD-10-CM | POA: Diagnosis not present

## 2020-02-16 DIAGNOSIS — E1122 Type 2 diabetes mellitus with diabetic chronic kidney disease: Secondary | ICD-10-CM | POA: Diagnosis not present

## 2020-02-25 DIAGNOSIS — Z23 Encounter for immunization: Secondary | ICD-10-CM | POA: Diagnosis not present

## 2020-02-26 ENCOUNTER — Encounter: Payer: Self-pay | Admitting: Oncology

## 2020-02-26 ENCOUNTER — Inpatient Hospital Stay: Payer: Medicare Other | Attending: Oncology

## 2020-02-26 ENCOUNTER — Inpatient Hospital Stay (HOSPITAL_BASED_OUTPATIENT_CLINIC_OR_DEPARTMENT_OTHER): Payer: Medicare Other | Admitting: Oncology

## 2020-02-26 VITALS — BP 161/62 | HR 56 | Temp 98.0°F | Resp 20 | Wt 210.0 lb

## 2020-02-26 DIAGNOSIS — R5383 Other fatigue: Secondary | ICD-10-CM | POA: Diagnosis not present

## 2020-02-26 DIAGNOSIS — Z79899 Other long term (current) drug therapy: Secondary | ICD-10-CM | POA: Insufficient documentation

## 2020-02-26 DIAGNOSIS — Z87891 Personal history of nicotine dependence: Secondary | ICD-10-CM | POA: Insufficient documentation

## 2020-02-26 DIAGNOSIS — N183 Chronic kidney disease, stage 3 unspecified: Secondary | ICD-10-CM | POA: Diagnosis not present

## 2020-02-26 DIAGNOSIS — D631 Anemia in chronic kidney disease: Secondary | ICD-10-CM | POA: Diagnosis not present

## 2020-02-26 DIAGNOSIS — I10 Essential (primary) hypertension: Secondary | ICD-10-CM | POA: Diagnosis not present

## 2020-02-26 DIAGNOSIS — I129 Hypertensive chronic kidney disease with stage 1 through stage 4 chronic kidney disease, or unspecified chronic kidney disease: Secondary | ICD-10-CM | POA: Diagnosis not present

## 2020-02-26 DIAGNOSIS — N2581 Secondary hyperparathyroidism of renal origin: Secondary | ICD-10-CM | POA: Diagnosis not present

## 2020-02-26 DIAGNOSIS — R809 Proteinuria, unspecified: Secondary | ICD-10-CM | POA: Diagnosis not present

## 2020-02-26 DIAGNOSIS — Z85828 Personal history of other malignant neoplasm of skin: Secondary | ICD-10-CM | POA: Insufficient documentation

## 2020-02-26 DIAGNOSIS — D518 Other vitamin B12 deficiency anemias: Secondary | ICD-10-CM | POA: Diagnosis not present

## 2020-02-26 DIAGNOSIS — E119 Type 2 diabetes mellitus without complications: Secondary | ICD-10-CM | POA: Diagnosis not present

## 2020-02-26 DIAGNOSIS — K219 Gastro-esophageal reflux disease without esophagitis: Secondary | ICD-10-CM | POA: Insufficient documentation

## 2020-02-26 DIAGNOSIS — D500 Iron deficiency anemia secondary to blood loss (chronic): Secondary | ICD-10-CM

## 2020-02-26 DIAGNOSIS — Z794 Long term (current) use of insulin: Secondary | ICD-10-CM | POA: Diagnosis not present

## 2020-02-26 DIAGNOSIS — E785 Hyperlipidemia, unspecified: Secondary | ICD-10-CM | POA: Diagnosis not present

## 2020-02-26 DIAGNOSIS — Z7982 Long term (current) use of aspirin: Secondary | ICD-10-CM | POA: Insufficient documentation

## 2020-02-26 DIAGNOSIS — D508 Other iron deficiency anemias: Secondary | ICD-10-CM

## 2020-02-26 DIAGNOSIS — E1122 Type 2 diabetes mellitus with diabetic chronic kidney disease: Secondary | ICD-10-CM | POA: Diagnosis not present

## 2020-02-26 DIAGNOSIS — D509 Iron deficiency anemia, unspecified: Secondary | ICD-10-CM | POA: Insufficient documentation

## 2020-02-26 DIAGNOSIS — N184 Chronic kidney disease, stage 4 (severe): Secondary | ICD-10-CM | POA: Diagnosis not present

## 2020-02-26 LAB — CBC
HCT: 29.6 % — ABNORMAL LOW (ref 39.0–52.0)
Hemoglobin: 10.3 g/dL — ABNORMAL LOW (ref 13.0–17.0)
MCH: 31.9 pg (ref 26.0–34.0)
MCHC: 34.8 g/dL (ref 30.0–36.0)
MCV: 91.6 fL (ref 80.0–100.0)
Platelets: 187 10*3/uL (ref 150–400)
RBC: 3.23 MIL/uL — ABNORMAL LOW (ref 4.22–5.81)
RDW: 12.9 % (ref 11.5–15.5)
WBC: 7.8 10*3/uL (ref 4.0–10.5)
nRBC: 0 % (ref 0.0–0.2)

## 2020-02-26 LAB — IRON AND TIBC
Iron: 88 ug/dL (ref 45–182)
Saturation Ratios: 31 % (ref 17.9–39.5)
TIBC: 281 ug/dL (ref 250–450)
UIBC: 193 ug/dL

## 2020-02-26 LAB — FERRITIN: Ferritin: 135 ng/mL (ref 24–336)

## 2020-03-02 NOTE — Progress Notes (Signed)
Hematology/Oncology Consult note [facility]  [phone] [phone]  Patient Care Team: Leonel Ramsay, MD as PCP - General (Infectious Diseases) Bary Castilla, Forest Gleason, MD (General Surgery) Leonel Ramsay, MD (Infectious Diseases) Anthonette Legato, MD (Internal Medicine) Sindy Guadeloupe, MD as Consulting Physician (Hematology and Oncology)   Name of the patient: Roger Knight  [id number]  June 06, 1950   Date of visit: 03/02/20  Diagnosis-anemia of chronic kidney disease along with a component of iron deficiency  Chief complaint/ Reason for visit-routine follow-up of anemia  Heme/Onc history: Patient is a 69 year old male with a past medical history significant for hypertension, hyperparathyroidism and staged chronic kidney disease. He has been referred to [place] for evaluation and management of anemia. Most recent CBC from 05/19/2016 showed white count of 7.9, H&H of 9.4/29 with an MCV of 78 and a platelet count of 33. Prior CBC from May 2017 revealed H&H of 12/35.8. Patient also had a recent CBC on 06/12/2016 which showed an H&H of 8.1/26.3 with an MCV of 79.2. Ferritin from October 2017 was low normal at 33.Patient does have a history of hemorrhoids and reports on and off rectal bleeding. Denies any frequent use of NSAIDs. He has had a complete GI evaluation in the past including capsule endoscopy which did not reveal any source of bleeding. Patient has been hospitalized a couple of times and a past for iron deficiency anemia requiring blood transfusion.Patient had an EGD colonoscopy in January 2017. EGD showed a nonbleeding gastric polyp which was biopsied and colonoscopy showed internal and external hemorrhoids. The cause of his GI bleed in the past has been attributed to his hemorrhoids. Patient was on oral iron for about a year or so but stopped taking it in summer of 2017. Currently patient feels well and denies any symptoms of fever,  shortness of breath on exertion or chest pain. Patient also has chronic kidney disease but has not received any EPO shots in the past  Patient hasbeen needing intermittent doses of Feraheme.He has not required EPO yet  Interval history-he does get some intermittent hemorrhoidal bleeding which is currently under good control.  He reports chronic fatigue but denies other complaints at this time.  ECOG PS- 1 Pain scale- 0   Review of systems- Review of Systems  Constitutional: Positive for malaise/fatigue. Negative for chills, fever and weight loss.  HENT: Negative for congestion, ear discharge and nosebleeds.   Eyes: Negative for blurred vision.  Respiratory: Negative for cough, hemoptysis, sputum production, shortness of breath and wheezing.   Cardiovascular: Negative for chest pain, palpitations, orthopnea and claudication.  Gastrointestinal: Negative for abdominal pain, blood in stool, constipation, diarrhea, heartburn, melena, nausea and vomiting.  Genitourinary: Negative for dysuria, flank pain, frequency, hematuria and urgency.  Musculoskeletal: Negative for back pain, joint pain and myalgias.  Skin: Negative for rash.  Neurological: Negative for dizziness, tingling, focal weakness, seizures, weakness and headaches.  Endo/Heme/Allergies: Does not bruise/bleed easily.  Psychiatric/Behavioral: Negative for depression and suicidal ideas. The patient does not have insomnia.      No Known Allergies   Past Medical History:  Diagnosis Date  . Accelerating angina ([place]) 02/11/2014  . Anemia   . Arthritis   . Carcinoma ([place]) 2012    skin cancer on neck [facility]  . Chronic kidney disease 2016  . Diabetes mellitus without complication ([place])   . GERD (gastroesophageal reflux disease)   . Hemorrhoids 2016-17  . Hernia   . Hyperlipidemia   .  Hypertension   . Skin cancer      Past Surgical History:  Procedure Laterality Date  . COLONOSCOPY  April 2014  . COLONOSCOPY N/A 05/13/2015     Procedure: COLONOSCOPY;  Surgeon: Josefine Class, MD;  Location: [facility] ENDOSCOPY;  Service: Endoscopy;  Laterality: N/A;  . CORONARY ARTERY BYPASS GRAFT  1996   [facility]  . CORONARY STENT PLACEMENT  2011   [facility]  . ESOPHAGOGASTRODUODENOSCOPY (EGD) WITH PROPOFOL N/A 05/13/2015   Procedure: ESOPHAGOGASTRODUODENOSCOPY (EGD) WITH PROPOFOL;  Surgeon: Josefine Class, MD;  Location: [facility] ENDOSCOPY;  Service: Endoscopy;  Laterality: N/A;  . UPPER GI ENDOSCOPY  2014    Social History   Socioeconomic History  . Marital status: Married    Spouse name: Not on file  . Number of children: Not on file  . Years of education: Not on file  . Highest education level: Not on file  Occupational History  . Not on file  Tobacco Use  . Smoking status: Former Smoker    Types: Cigarettes    Quit date: 04/24/2005    Years since quitting: 14.8  . Smokeless tobacco: Former Systems developer    Types: Secondary school teacher  . Vaping Use: Never used  Substance and Sexual Activity  . Alcohol use: No  . Drug use: No  . Sexual activity: Yes  Other Topics Concern  . Not on file  Social History Narrative  . Not on file   Social Determinants of Health   Financial Resource Strain:   . Difficulty of Paying Living Expenses: Not on file  Food Insecurity:   . Worried About Charity fundraiser in the Last Year: Not on file  . Ran Out of Food in the Last Year: Not on file  Transportation Needs:   . Lack of Transportation (Medical): Not on file  . Lack of Transportation (Non-Medical): Not on file  Physical Activity:   . Days of Exercise per Week: Not on file  . Minutes of Exercise per Session: Not on file  Stress:   . Feeling of Stress : Not on file  Social Connections:   . Frequency of Communication with Friends and Family: Not on file  . Frequency of Social Gatherings with Friends and Family: Not on file  . Attends Religious Services: Not on file  . Active Member of Clubs or Organizations: Not on file  . Attends  Archivist Meetings: Not on file  . Marital Status: Not on file  Intimate Partner Violence:   . Fear of Current or Ex-Partner: Not on file  . Emotionally Abused: Not on file  . Physically Abused: Not on file  . Sexually Abused: Not on file    Family History  Problem Relation Age of Onset  . Dementia Mother 76  . Osteoarthritis Mother   . Cancer Mother      Current Outpatient Medications:  .  amLODipine (NORVASC) 5 MG tablet, Take 5 mg by mouth daily. , Disp: , Rfl:  .  aspirin 81 MG tablet, Take 81 mg by mouth daily., Disp: , Rfl:  .  atorvastatin (LIPITOR) 80 MG tablet, Take 80 mg by mouth daily., Disp: , Rfl:  .  calcitRIOL (ROCALTROL) 0.25 MCG capsule, Take 0.25 mcg by mouth daily. , Disp: , Rfl:  .  cetirizine (ZYRTEC) 10 MG tablet, Take 10 mg by mouth daily., Disp: , Rfl:  .  ezetimibe (ZETIA) 10 MG tablet, Take 10 mg by mouth daily., Disp: , Rfl:  .  FIBER FORMULA PO, Take 2 tablets by mouth 2 (two) times daily. , Disp: , Rfl:  .  insulin aspart (NOVOLOG) 100 UNIT/ML injection, Inject 20 Units into the skin 3 (three) times daily. , Disp: , Rfl:  .  Insulin Pen Needle (FIFTY50 PEN NEEDLES) 32G X 4 MM MISC, USE WITH INJECTIONS 4 TIMES DAILY, Disp: , Rfl:  .  metoprolol (TOPROL-XL) 200 MG 24 hr tablet, Take 200 mg by mouth daily., Disp: , Rfl:  .  Multiple Vitamins-Minerals (MULTIVITAMIN WITH MINERALS) tablet, Take 1 tablet by mouth daily., Disp: , Rfl:  .  pantoprazole (PROTONIX) 20 MG tablet, Take 40 mg by mouth 2 (two) times daily before a meal. , Disp: , Rfl:  .  sildenafil (VIAGRA) 100 MG tablet, Take 100 mg by mouth as needed for erectile dysfunction. Reported on 06/14/2015, Disp: , Rfl:  .  spironolactone (ALDACTONE) 25 MG tablet, Take 25 mg by mouth 2 (two) times daily., Disp: , Rfl:  .  TRESIBA FLEXTOUCH 200 UNIT/ML SOPN, Inject 90 Units into the skin daily. , Disp: , Rfl:  .  valsartan-hydrochlorothiazide (DIOVAN-HCT) 320-25 MG per tablet, Take 1 tablet by mouth  daily., Disp: , Rfl:  .  gabapentin (NEURONTIN) 300 MG capsule, Take 300 mg by mouth at bedtime., Disp: , Rfl:   Physical exam:  Vitals:   02/26/20 1120  BP: (!) 161/62  Pulse: (!) 56  Resp: 20  Temp: 98 F (36.7 C)  SpO2: 100%  Weight: 210 lb (95.3 kg)   Physical Exam Constitutional:      General: He is not in acute distress. Cardiovascular:     Rate and Rhythm: Normal rate and regular rhythm.     Heart sounds: Normal heart sounds.  Pulmonary:     Effort: Pulmonary effort is normal.     Breath sounds: Normal breath sounds.  Abdominal:     General: Bowel sounds are normal.     Palpations: Abdomen is soft.  Skin:    General: Skin is warm and dry.  Neurological:     Mental Status: He is alert and oriented to person, place, and time.      CMP Latest Ref Rng & Units 09/01/2019  Glucose 70 - 99 mg/dL 188(H)  BUN 8 - 23 mg/dL 46(H)  Creatinine 0.61 - 1.24 mg/dL 3.67(H)  Sodium 135 - 145 mmol/L 136  Potassium 3.5 - 5.1 mmol/L 5.1  Chloride 98 - 111 mmol/L 106  CO2 22 - 32 mmol/L 21(L)  Calcium 8.9 - 10.3 mg/dL 8.2(L)  Total Protein 6.5 - 8.1 g/dL 6.6  Total Bilirubin 0.3 - 1.2 mg/dL 0.5  Alkaline Phos 38 - 126 U/L 59  AST 15 - 41 U/L 22  ALT 0 - 44 U/L 31   CBC Latest Ref Rng & Units 02/26/2020  WBC 4.0 - 10.5 K/uL 7.8  Hemoglobin 13.0 - 17.0 g/dL 10.3(L)  Hematocrit 39 - 52 % 29.6(L)  Platelets 150 - 400 K/uL 187      Assessment and plan- Patient is a 69 y.o. male with anemia of chronic kidney disease and component of iron deficiency here for routine follow-up  Patient's hemoglobin currently remained stable around 10. He does drop intermittently into the eights and gets better when he receives IV iron.  He recently received iron in September 2021.  He does not require any EPO yet.  However if there is a continued decline in his hemoglobin despite receiving IV iron we will start EPO at that time.  CBC ferritin and iron studies in 3 in 6 months and I will see him back  in 6 months   Visit Diagnosis 1. Anemia of chronic kidney failure, stage 3 (moderate) (HCC)   2. Other iron deficiency anemia   3. Other vitamin B12 deficiency anemia      Dr. Randa Evens, MD, MPH [facility] [phone] 03/02/2020 1:18 PM

## 2020-03-05 DIAGNOSIS — Z23 Encounter for immunization: Secondary | ICD-10-CM | POA: Diagnosis not present

## 2020-03-30 DIAGNOSIS — E1122 Type 2 diabetes mellitus with diabetic chronic kidney disease: Secondary | ICD-10-CM | POA: Diagnosis not present

## 2020-03-30 DIAGNOSIS — N184 Chronic kidney disease, stage 4 (severe): Secondary | ICD-10-CM | POA: Diagnosis not present

## 2020-04-08 DIAGNOSIS — I10 Essential (primary) hypertension: Secondary | ICD-10-CM | POA: Diagnosis not present

## 2020-04-08 DIAGNOSIS — D631 Anemia in chronic kidney disease: Secondary | ICD-10-CM | POA: Diagnosis not present

## 2020-04-08 DIAGNOSIS — E1122 Type 2 diabetes mellitus with diabetic chronic kidney disease: Secondary | ICD-10-CM | POA: Diagnosis not present

## 2020-04-08 DIAGNOSIS — N185 Chronic kidney disease, stage 5: Secondary | ICD-10-CM | POA: Diagnosis not present

## 2020-04-08 DIAGNOSIS — N2581 Secondary hyperparathyroidism of renal origin: Secondary | ICD-10-CM | POA: Diagnosis not present

## 2020-04-22 DIAGNOSIS — I2510 Atherosclerotic heart disease of native coronary artery without angina pectoris: Secondary | ICD-10-CM | POA: Diagnosis not present

## 2020-04-22 DIAGNOSIS — I300 Acute nonspecific idiopathic pericarditis: Secondary | ICD-10-CM | POA: Diagnosis not present

## 2020-04-22 DIAGNOSIS — E785 Hyperlipidemia, unspecified: Secondary | ICD-10-CM | POA: Diagnosis not present

## 2020-04-22 DIAGNOSIS — I200 Unstable angina: Secondary | ICD-10-CM | POA: Diagnosis not present

## 2020-04-22 DIAGNOSIS — I10 Essential (primary) hypertension: Secondary | ICD-10-CM | POA: Diagnosis not present

## 2020-04-26 DIAGNOSIS — I300 Acute nonspecific idiopathic pericarditis: Secondary | ICD-10-CM | POA: Diagnosis not present

## 2020-04-27 DIAGNOSIS — I10 Essential (primary) hypertension: Secondary | ICD-10-CM | POA: Diagnosis not present

## 2020-04-27 DIAGNOSIS — E11649 Type 2 diabetes mellitus with hypoglycemia without coma: Secondary | ICD-10-CM | POA: Diagnosis not present

## 2020-04-27 DIAGNOSIS — E1122 Type 2 diabetes mellitus with diabetic chronic kidney disease: Secondary | ICD-10-CM | POA: Diagnosis not present

## 2020-04-27 DIAGNOSIS — E1142 Type 2 diabetes mellitus with diabetic polyneuropathy: Secondary | ICD-10-CM | POA: Diagnosis not present

## 2020-04-27 DIAGNOSIS — E1159 Type 2 diabetes mellitus with other circulatory complications: Secondary | ICD-10-CM | POA: Diagnosis not present

## 2020-04-27 DIAGNOSIS — N185 Chronic kidney disease, stage 5: Secondary | ICD-10-CM | POA: Diagnosis not present

## 2020-04-27 DIAGNOSIS — Z794 Long term (current) use of insulin: Secondary | ICD-10-CM | POA: Diagnosis not present

## 2020-05-12 DIAGNOSIS — I2510 Atherosclerotic heart disease of native coronary artery without angina pectoris: Secondary | ICD-10-CM | POA: Diagnosis not present

## 2020-05-12 DIAGNOSIS — N189 Chronic kidney disease, unspecified: Secondary | ICD-10-CM | POA: Diagnosis not present

## 2020-05-12 DIAGNOSIS — D649 Anemia, unspecified: Secondary | ICD-10-CM | POA: Diagnosis not present

## 2020-05-12 DIAGNOSIS — E785 Hyperlipidemia, unspecified: Secondary | ICD-10-CM | POA: Diagnosis not present

## 2020-05-12 DIAGNOSIS — Z794 Long term (current) use of insulin: Secondary | ICD-10-CM | POA: Diagnosis not present

## 2020-05-12 DIAGNOSIS — I10 Essential (primary) hypertension: Secondary | ICD-10-CM | POA: Diagnosis not present

## 2020-05-12 DIAGNOSIS — N185 Chronic kidney disease, stage 5: Secondary | ICD-10-CM | POA: Diagnosis not present

## 2020-05-12 DIAGNOSIS — E1122 Type 2 diabetes mellitus with diabetic chronic kidney disease: Secondary | ICD-10-CM | POA: Diagnosis not present

## 2020-05-20 DIAGNOSIS — N2581 Secondary hyperparathyroidism of renal origin: Secondary | ICD-10-CM | POA: Diagnosis not present

## 2020-05-20 DIAGNOSIS — N185 Chronic kidney disease, stage 5: Secondary | ICD-10-CM | POA: Diagnosis not present

## 2020-05-20 DIAGNOSIS — I10 Essential (primary) hypertension: Secondary | ICD-10-CM | POA: Diagnosis not present

## 2020-05-20 DIAGNOSIS — D631 Anemia in chronic kidney disease: Secondary | ICD-10-CM | POA: Diagnosis not present

## 2020-05-20 DIAGNOSIS — E1122 Type 2 diabetes mellitus with diabetic chronic kidney disease: Secondary | ICD-10-CM | POA: Diagnosis not present

## 2020-05-28 ENCOUNTER — Inpatient Hospital Stay: Payer: Medicare Other | Attending: Oncology

## 2020-05-28 DIAGNOSIS — Z79899 Other long term (current) drug therapy: Secondary | ICD-10-CM | POA: Diagnosis not present

## 2020-05-28 DIAGNOSIS — D631 Anemia in chronic kidney disease: Secondary | ICD-10-CM | POA: Diagnosis not present

## 2020-05-28 DIAGNOSIS — N184 Chronic kidney disease, stage 4 (severe): Secondary | ICD-10-CM | POA: Insufficient documentation

## 2020-05-28 DIAGNOSIS — I129 Hypertensive chronic kidney disease with stage 1 through stage 4 chronic kidney disease, or unspecified chronic kidney disease: Secondary | ICD-10-CM | POA: Diagnosis not present

## 2020-05-28 LAB — CBC WITH DIFFERENTIAL/PLATELET
Abs Immature Granulocytes: 0.03 10*3/uL (ref 0.00–0.07)
Basophils Absolute: 0.1 10*3/uL (ref 0.0–0.1)
Basophils Relative: 1 %
Eosinophils Absolute: 0.1 10*3/uL (ref 0.0–0.5)
Eosinophils Relative: 2 %
HCT: 28 % — ABNORMAL LOW (ref 39.0–52.0)
Hemoglobin: 9.8 g/dL — ABNORMAL LOW (ref 13.0–17.0)
Immature Granulocytes: 0 %
Lymphocytes Relative: 14 %
Lymphs Abs: 1.2 10*3/uL (ref 0.7–4.0)
MCH: 31.1 pg (ref 26.0–34.0)
MCHC: 35 g/dL (ref 30.0–36.0)
MCV: 88.9 fL (ref 80.0–100.0)
Monocytes Absolute: 0.7 10*3/uL (ref 0.1–1.0)
Monocytes Relative: 8 %
Neutro Abs: 6.6 10*3/uL (ref 1.7–7.7)
Neutrophils Relative %: 75 %
Platelets: 197 10*3/uL (ref 150–400)
RBC: 3.15 MIL/uL — ABNORMAL LOW (ref 4.22–5.81)
RDW: 13 % (ref 11.5–15.5)
WBC: 8.8 10*3/uL (ref 4.0–10.5)
nRBC: 0 % (ref 0.0–0.2)

## 2020-05-28 LAB — IRON AND TIBC
Iron: 87 ug/dL (ref 45–182)
Saturation Ratios: 38 % (ref 17.9–39.5)
TIBC: 230 ug/dL — ABNORMAL LOW (ref 250–450)
UIBC: 143 ug/dL

## 2020-05-28 LAB — FERRITIN: Ferritin: 95 ng/mL (ref 24–336)

## 2020-06-02 DIAGNOSIS — I200 Unstable angina: Secondary | ICD-10-CM | POA: Diagnosis not present

## 2020-06-02 DIAGNOSIS — N185 Chronic kidney disease, stage 5: Secondary | ICD-10-CM | POA: Diagnosis not present

## 2020-06-02 DIAGNOSIS — Z794 Long term (current) use of insulin: Secondary | ICD-10-CM | POA: Diagnosis not present

## 2020-06-02 DIAGNOSIS — Z01818 Encounter for other preprocedural examination: Secondary | ICD-10-CM | POA: Insufficient documentation

## 2020-06-02 DIAGNOSIS — I2510 Atherosclerotic heart disease of native coronary artery without angina pectoris: Secondary | ICD-10-CM | POA: Diagnosis not present

## 2020-06-02 DIAGNOSIS — Z4902 Encounter for fitting and adjustment of peritoneal dialysis catheter: Secondary | ICD-10-CM | POA: Diagnosis not present

## 2020-06-02 DIAGNOSIS — E785 Hyperlipidemia, unspecified: Secondary | ICD-10-CM | POA: Diagnosis not present

## 2020-06-02 DIAGNOSIS — I10 Essential (primary) hypertension: Secondary | ICD-10-CM | POA: Diagnosis not present

## 2020-06-02 DIAGNOSIS — Z951 Presence of aortocoronary bypass graft: Secondary | ICD-10-CM | POA: Diagnosis not present

## 2020-06-02 DIAGNOSIS — I120 Hypertensive chronic kidney disease with stage 5 chronic kidney disease or end stage renal disease: Secondary | ICD-10-CM | POA: Diagnosis not present

## 2020-06-02 DIAGNOSIS — E1122 Type 2 diabetes mellitus with diabetic chronic kidney disease: Secondary | ICD-10-CM | POA: Diagnosis not present

## 2020-06-08 DIAGNOSIS — N185 Chronic kidney disease, stage 5: Secondary | ICD-10-CM | POA: Diagnosis not present

## 2020-06-08 DIAGNOSIS — E1122 Type 2 diabetes mellitus with diabetic chronic kidney disease: Secondary | ICD-10-CM | POA: Diagnosis not present

## 2020-06-14 DIAGNOSIS — D631 Anemia in chronic kidney disease: Secondary | ICD-10-CM | POA: Diagnosis not present

## 2020-06-14 DIAGNOSIS — E1122 Type 2 diabetes mellitus with diabetic chronic kidney disease: Secondary | ICD-10-CM | POA: Diagnosis not present

## 2020-06-14 DIAGNOSIS — N185 Chronic kidney disease, stage 5: Secondary | ICD-10-CM | POA: Diagnosis not present

## 2020-06-14 DIAGNOSIS — E875 Hyperkalemia: Secondary | ICD-10-CM | POA: Diagnosis not present

## 2020-06-14 DIAGNOSIS — I10 Essential (primary) hypertension: Secondary | ICD-10-CM | POA: Diagnosis not present

## 2020-06-14 DIAGNOSIS — N2581 Secondary hyperparathyroidism of renal origin: Secondary | ICD-10-CM | POA: Diagnosis not present

## 2020-06-18 DIAGNOSIS — I517 Cardiomegaly: Secondary | ICD-10-CM | POA: Diagnosis not present

## 2020-06-18 DIAGNOSIS — Z20822 Contact with and (suspected) exposure to covid-19: Secondary | ICD-10-CM | POA: Diagnosis not present

## 2020-06-18 DIAGNOSIS — R0602 Shortness of breath: Secondary | ICD-10-CM | POA: Diagnosis not present

## 2020-06-28 DIAGNOSIS — N2581 Secondary hyperparathyroidism of renal origin: Secondary | ICD-10-CM | POA: Diagnosis not present

## 2020-06-28 DIAGNOSIS — N185 Chronic kidney disease, stage 5: Secondary | ICD-10-CM | POA: Diagnosis not present

## 2020-06-28 DIAGNOSIS — I10 Essential (primary) hypertension: Secondary | ICD-10-CM | POA: Diagnosis not present

## 2020-06-28 DIAGNOSIS — E1122 Type 2 diabetes mellitus with diabetic chronic kidney disease: Secondary | ICD-10-CM | POA: Diagnosis not present

## 2020-07-01 DIAGNOSIS — I25119 Atherosclerotic heart disease of native coronary artery with unspecified angina pectoris: Secondary | ICD-10-CM | POA: Diagnosis not present

## 2020-07-01 DIAGNOSIS — Z794 Long term (current) use of insulin: Secondary | ICD-10-CM | POA: Diagnosis not present

## 2020-07-01 DIAGNOSIS — Z955 Presence of coronary angioplasty implant and graft: Secondary | ICD-10-CM | POA: Diagnosis not present

## 2020-07-01 DIAGNOSIS — I120 Hypertensive chronic kidney disease with stage 5 chronic kidney disease or end stage renal disease: Secondary | ICD-10-CM | POA: Diagnosis not present

## 2020-07-01 DIAGNOSIS — N185 Chronic kidney disease, stage 5: Secondary | ICD-10-CM | POA: Diagnosis not present

## 2020-07-01 DIAGNOSIS — E1169 Type 2 diabetes mellitus with other specified complication: Secondary | ICD-10-CM | POA: Diagnosis not present

## 2020-07-01 DIAGNOSIS — Z87891 Personal history of nicotine dependence: Secondary | ICD-10-CM | POA: Diagnosis not present

## 2020-07-01 DIAGNOSIS — K668 Other specified disorders of peritoneum: Secondary | ICD-10-CM | POA: Diagnosis not present

## 2020-07-01 DIAGNOSIS — Z79899 Other long term (current) drug therapy: Secondary | ICD-10-CM | POA: Diagnosis not present

## 2020-07-01 DIAGNOSIS — Z7682 Awaiting organ transplant status: Secondary | ICD-10-CM | POA: Diagnosis not present

## 2020-07-01 DIAGNOSIS — E1142 Type 2 diabetes mellitus with diabetic polyneuropathy: Secondary | ICD-10-CM | POA: Diagnosis not present

## 2020-07-01 DIAGNOSIS — E1122 Type 2 diabetes mellitus with diabetic chronic kidney disease: Secondary | ICD-10-CM | POA: Diagnosis not present

## 2020-07-01 DIAGNOSIS — E785 Hyperlipidemia, unspecified: Secondary | ICD-10-CM | POA: Diagnosis not present

## 2020-07-01 DIAGNOSIS — Z4902 Encounter for fitting and adjustment of peritoneal dialysis catheter: Secondary | ICD-10-CM | POA: Diagnosis not present

## 2020-07-01 DIAGNOSIS — K658 Other peritonitis: Secondary | ICD-10-CM | POA: Diagnosis not present

## 2020-07-01 DIAGNOSIS — N186 End stage renal disease: Secondary | ICD-10-CM | POA: Diagnosis not present

## 2020-07-01 DIAGNOSIS — K654 Sclerosing mesenteritis: Secondary | ICD-10-CM | POA: Diagnosis not present

## 2020-07-01 DIAGNOSIS — Z951 Presence of aortocoronary bypass graft: Secondary | ICD-10-CM | POA: Diagnosis not present

## 2020-07-12 DIAGNOSIS — N186 End stage renal disease: Secondary | ICD-10-CM | POA: Diagnosis not present

## 2020-07-12 DIAGNOSIS — Z992 Dependence on renal dialysis: Secondary | ICD-10-CM | POA: Diagnosis not present

## 2020-07-12 DIAGNOSIS — E559 Vitamin D deficiency, unspecified: Secondary | ICD-10-CM | POA: Diagnosis not present

## 2020-07-12 DIAGNOSIS — E119 Type 2 diabetes mellitus without complications: Secondary | ICD-10-CM | POA: Diagnosis not present

## 2020-07-12 DIAGNOSIS — Z794 Long term (current) use of insulin: Secondary | ICD-10-CM | POA: Diagnosis not present

## 2020-07-12 DIAGNOSIS — D509 Iron deficiency anemia, unspecified: Secondary | ICD-10-CM | POA: Diagnosis not present

## 2020-07-12 DIAGNOSIS — D631 Anemia in chronic kidney disease: Secondary | ICD-10-CM | POA: Diagnosis not present

## 2020-07-13 DIAGNOSIS — E559 Vitamin D deficiency, unspecified: Secondary | ICD-10-CM | POA: Diagnosis not present

## 2020-07-13 DIAGNOSIS — N186 End stage renal disease: Secondary | ICD-10-CM | POA: Diagnosis not present

## 2020-07-13 DIAGNOSIS — Z992 Dependence on renal dialysis: Secondary | ICD-10-CM | POA: Diagnosis not present

## 2020-07-13 DIAGNOSIS — D631 Anemia in chronic kidney disease: Secondary | ICD-10-CM | POA: Diagnosis not present

## 2020-07-13 DIAGNOSIS — D509 Iron deficiency anemia, unspecified: Secondary | ICD-10-CM | POA: Diagnosis not present

## 2020-07-14 ENCOUNTER — Encounter: Payer: Self-pay | Admitting: Oncology

## 2020-07-14 DIAGNOSIS — Z992 Dependence on renal dialysis: Secondary | ICD-10-CM | POA: Diagnosis not present

## 2020-07-14 DIAGNOSIS — D509 Iron deficiency anemia, unspecified: Secondary | ICD-10-CM | POA: Diagnosis not present

## 2020-07-14 DIAGNOSIS — D631 Anemia in chronic kidney disease: Secondary | ICD-10-CM | POA: Diagnosis not present

## 2020-07-14 DIAGNOSIS — N186 End stage renal disease: Secondary | ICD-10-CM | POA: Diagnosis not present

## 2020-07-14 DIAGNOSIS — E559 Vitamin D deficiency, unspecified: Secondary | ICD-10-CM | POA: Diagnosis not present

## 2020-07-16 DIAGNOSIS — R768 Other specified abnormal immunological findings in serum: Secondary | ICD-10-CM | POA: Diagnosis not present

## 2020-07-19 DIAGNOSIS — Z992 Dependence on renal dialysis: Secondary | ICD-10-CM | POA: Diagnosis not present

## 2020-07-19 DIAGNOSIS — R768 Other specified abnormal immunological findings in serum: Secondary | ICD-10-CM | POA: Insufficient documentation

## 2020-07-19 DIAGNOSIS — E559 Vitamin D deficiency, unspecified: Secondary | ICD-10-CM | POA: Diagnosis not present

## 2020-07-19 DIAGNOSIS — N186 End stage renal disease: Secondary | ICD-10-CM | POA: Diagnosis not present

## 2020-07-19 DIAGNOSIS — D631 Anemia in chronic kidney disease: Secondary | ICD-10-CM | POA: Diagnosis not present

## 2020-07-19 DIAGNOSIS — D509 Iron deficiency anemia, unspecified: Secondary | ICD-10-CM | POA: Diagnosis not present

## 2020-07-20 DIAGNOSIS — E559 Vitamin D deficiency, unspecified: Secondary | ICD-10-CM | POA: Diagnosis not present

## 2020-07-20 DIAGNOSIS — Z992 Dependence on renal dialysis: Secondary | ICD-10-CM | POA: Diagnosis not present

## 2020-07-20 DIAGNOSIS — D631 Anemia in chronic kidney disease: Secondary | ICD-10-CM | POA: Diagnosis not present

## 2020-07-20 DIAGNOSIS — D509 Iron deficiency anemia, unspecified: Secondary | ICD-10-CM | POA: Diagnosis not present

## 2020-07-20 DIAGNOSIS — N186 End stage renal disease: Secondary | ICD-10-CM | POA: Diagnosis not present

## 2020-07-21 DIAGNOSIS — Z4902 Encounter for fitting and adjustment of peritoneal dialysis catheter: Secondary | ICD-10-CM | POA: Diagnosis not present

## 2020-07-21 DIAGNOSIS — E1149 Type 2 diabetes mellitus with other diabetic neurological complication: Secondary | ICD-10-CM | POA: Diagnosis not present

## 2020-07-21 DIAGNOSIS — E1122 Type 2 diabetes mellitus with diabetic chronic kidney disease: Secondary | ICD-10-CM | POA: Diagnosis not present

## 2020-07-21 DIAGNOSIS — Z794 Long term (current) use of insulin: Secondary | ICD-10-CM | POA: Diagnosis not present

## 2020-07-21 DIAGNOSIS — N185 Chronic kidney disease, stage 5: Secondary | ICD-10-CM | POA: Diagnosis not present

## 2020-07-22 DIAGNOSIS — N186 End stage renal disease: Secondary | ICD-10-CM | POA: Diagnosis not present

## 2020-07-22 DIAGNOSIS — Z992 Dependence on renal dialysis: Secondary | ICD-10-CM | POA: Diagnosis not present

## 2020-07-22 DIAGNOSIS — E559 Vitamin D deficiency, unspecified: Secondary | ICD-10-CM | POA: Diagnosis not present

## 2020-07-22 DIAGNOSIS — D631 Anemia in chronic kidney disease: Secondary | ICD-10-CM | POA: Diagnosis not present

## 2020-07-22 DIAGNOSIS — D509 Iron deficiency anemia, unspecified: Secondary | ICD-10-CM | POA: Diagnosis not present

## 2020-07-23 DIAGNOSIS — N186 End stage renal disease: Secondary | ICD-10-CM | POA: Diagnosis not present

## 2020-07-23 DIAGNOSIS — D631 Anemia in chronic kidney disease: Secondary | ICD-10-CM | POA: Diagnosis not present

## 2020-07-23 DIAGNOSIS — Z992 Dependence on renal dialysis: Secondary | ICD-10-CM | POA: Diagnosis not present

## 2020-07-26 DIAGNOSIS — Z992 Dependence on renal dialysis: Secondary | ICD-10-CM | POA: Diagnosis not present

## 2020-07-26 DIAGNOSIS — N186 End stage renal disease: Secondary | ICD-10-CM | POA: Diagnosis not present

## 2020-07-26 DIAGNOSIS — D631 Anemia in chronic kidney disease: Secondary | ICD-10-CM | POA: Diagnosis not present

## 2020-07-28 DIAGNOSIS — N186 End stage renal disease: Secondary | ICD-10-CM | POA: Diagnosis not present

## 2020-07-28 DIAGNOSIS — D631 Anemia in chronic kidney disease: Secondary | ICD-10-CM | POA: Diagnosis not present

## 2020-07-28 DIAGNOSIS — Z992 Dependence on renal dialysis: Secondary | ICD-10-CM | POA: Diagnosis not present

## 2020-07-29 DIAGNOSIS — Z992 Dependence on renal dialysis: Secondary | ICD-10-CM | POA: Diagnosis not present

## 2020-07-29 DIAGNOSIS — E559 Vitamin D deficiency, unspecified: Secondary | ICD-10-CM | POA: Diagnosis not present

## 2020-07-29 DIAGNOSIS — D509 Iron deficiency anemia, unspecified: Secondary | ICD-10-CM | POA: Diagnosis not present

## 2020-07-29 DIAGNOSIS — N186 End stage renal disease: Secondary | ICD-10-CM | POA: Diagnosis not present

## 2020-07-29 DIAGNOSIS — D631 Anemia in chronic kidney disease: Secondary | ICD-10-CM | POA: Diagnosis not present

## 2020-07-30 DIAGNOSIS — N186 End stage renal disease: Secondary | ICD-10-CM | POA: Diagnosis not present

## 2020-07-30 DIAGNOSIS — Z992 Dependence on renal dialysis: Secondary | ICD-10-CM | POA: Diagnosis not present

## 2020-07-30 DIAGNOSIS — D509 Iron deficiency anemia, unspecified: Secondary | ICD-10-CM | POA: Diagnosis not present

## 2020-07-30 DIAGNOSIS — E559 Vitamin D deficiency, unspecified: Secondary | ICD-10-CM | POA: Diagnosis not present

## 2020-07-30 DIAGNOSIS — D631 Anemia in chronic kidney disease: Secondary | ICD-10-CM | POA: Diagnosis not present

## 2020-07-31 DIAGNOSIS — E559 Vitamin D deficiency, unspecified: Secondary | ICD-10-CM | POA: Diagnosis not present

## 2020-07-31 DIAGNOSIS — Z992 Dependence on renal dialysis: Secondary | ICD-10-CM | POA: Diagnosis not present

## 2020-07-31 DIAGNOSIS — D631 Anemia in chronic kidney disease: Secondary | ICD-10-CM | POA: Diagnosis not present

## 2020-07-31 DIAGNOSIS — D509 Iron deficiency anemia, unspecified: Secondary | ICD-10-CM | POA: Diagnosis not present

## 2020-07-31 DIAGNOSIS — N186 End stage renal disease: Secondary | ICD-10-CM | POA: Diagnosis not present

## 2020-08-01 DIAGNOSIS — D509 Iron deficiency anemia, unspecified: Secondary | ICD-10-CM | POA: Diagnosis not present

## 2020-08-01 DIAGNOSIS — N186 End stage renal disease: Secondary | ICD-10-CM | POA: Diagnosis not present

## 2020-08-01 DIAGNOSIS — Z992 Dependence on renal dialysis: Secondary | ICD-10-CM | POA: Diagnosis not present

## 2020-08-01 DIAGNOSIS — D631 Anemia in chronic kidney disease: Secondary | ICD-10-CM | POA: Diagnosis not present

## 2020-08-01 DIAGNOSIS — E559 Vitamin D deficiency, unspecified: Secondary | ICD-10-CM | POA: Diagnosis not present

## 2020-08-02 DIAGNOSIS — D631 Anemia in chronic kidney disease: Secondary | ICD-10-CM | POA: Diagnosis not present

## 2020-08-02 DIAGNOSIS — N186 End stage renal disease: Secondary | ICD-10-CM | POA: Diagnosis not present

## 2020-08-02 DIAGNOSIS — D509 Iron deficiency anemia, unspecified: Secondary | ICD-10-CM | POA: Diagnosis not present

## 2020-08-02 DIAGNOSIS — E559 Vitamin D deficiency, unspecified: Secondary | ICD-10-CM | POA: Diagnosis not present

## 2020-08-02 DIAGNOSIS — Z992 Dependence on renal dialysis: Secondary | ICD-10-CM | POA: Diagnosis not present

## 2020-08-03 DIAGNOSIS — N186 End stage renal disease: Secondary | ICD-10-CM | POA: Diagnosis not present

## 2020-08-03 DIAGNOSIS — D631 Anemia in chronic kidney disease: Secondary | ICD-10-CM | POA: Diagnosis not present

## 2020-08-03 DIAGNOSIS — D509 Iron deficiency anemia, unspecified: Secondary | ICD-10-CM | POA: Diagnosis not present

## 2020-08-03 DIAGNOSIS — E559 Vitamin D deficiency, unspecified: Secondary | ICD-10-CM | POA: Diagnosis not present

## 2020-08-03 DIAGNOSIS — Z992 Dependence on renal dialysis: Secondary | ICD-10-CM | POA: Diagnosis not present

## 2020-08-04 DIAGNOSIS — D509 Iron deficiency anemia, unspecified: Secondary | ICD-10-CM | POA: Diagnosis not present

## 2020-08-04 DIAGNOSIS — Z992 Dependence on renal dialysis: Secondary | ICD-10-CM | POA: Diagnosis not present

## 2020-08-04 DIAGNOSIS — N186 End stage renal disease: Secondary | ICD-10-CM | POA: Diagnosis not present

## 2020-08-04 DIAGNOSIS — D631 Anemia in chronic kidney disease: Secondary | ICD-10-CM | POA: Diagnosis not present

## 2020-08-04 DIAGNOSIS — E559 Vitamin D deficiency, unspecified: Secondary | ICD-10-CM | POA: Diagnosis not present

## 2020-08-05 DIAGNOSIS — D509 Iron deficiency anemia, unspecified: Secondary | ICD-10-CM | POA: Diagnosis not present

## 2020-08-05 DIAGNOSIS — Z992 Dependence on renal dialysis: Secondary | ICD-10-CM | POA: Diagnosis not present

## 2020-08-05 DIAGNOSIS — E559 Vitamin D deficiency, unspecified: Secondary | ICD-10-CM | POA: Diagnosis not present

## 2020-08-05 DIAGNOSIS — D631 Anemia in chronic kidney disease: Secondary | ICD-10-CM | POA: Diagnosis not present

## 2020-08-05 DIAGNOSIS — N186 End stage renal disease: Secondary | ICD-10-CM | POA: Diagnosis not present

## 2020-08-06 DIAGNOSIS — Z992 Dependence on renal dialysis: Secondary | ICD-10-CM | POA: Diagnosis not present

## 2020-08-06 DIAGNOSIS — E559 Vitamin D deficiency, unspecified: Secondary | ICD-10-CM | POA: Diagnosis not present

## 2020-08-06 DIAGNOSIS — N186 End stage renal disease: Secondary | ICD-10-CM | POA: Diagnosis not present

## 2020-08-06 DIAGNOSIS — D631 Anemia in chronic kidney disease: Secondary | ICD-10-CM | POA: Diagnosis not present

## 2020-08-06 DIAGNOSIS — D509 Iron deficiency anemia, unspecified: Secondary | ICD-10-CM | POA: Diagnosis not present

## 2020-08-07 DIAGNOSIS — D509 Iron deficiency anemia, unspecified: Secondary | ICD-10-CM | POA: Diagnosis not present

## 2020-08-07 DIAGNOSIS — Z992 Dependence on renal dialysis: Secondary | ICD-10-CM | POA: Diagnosis not present

## 2020-08-07 DIAGNOSIS — N186 End stage renal disease: Secondary | ICD-10-CM | POA: Diagnosis not present

## 2020-08-07 DIAGNOSIS — E559 Vitamin D deficiency, unspecified: Secondary | ICD-10-CM | POA: Diagnosis not present

## 2020-08-07 DIAGNOSIS — D631 Anemia in chronic kidney disease: Secondary | ICD-10-CM | POA: Diagnosis not present

## 2020-08-08 DIAGNOSIS — Z992 Dependence on renal dialysis: Secondary | ICD-10-CM | POA: Diagnosis not present

## 2020-08-08 DIAGNOSIS — E559 Vitamin D deficiency, unspecified: Secondary | ICD-10-CM | POA: Diagnosis not present

## 2020-08-08 DIAGNOSIS — N186 End stage renal disease: Secondary | ICD-10-CM | POA: Diagnosis not present

## 2020-08-08 DIAGNOSIS — D631 Anemia in chronic kidney disease: Secondary | ICD-10-CM | POA: Diagnosis not present

## 2020-08-08 DIAGNOSIS — D509 Iron deficiency anemia, unspecified: Secondary | ICD-10-CM | POA: Diagnosis not present

## 2020-08-09 DIAGNOSIS — D631 Anemia in chronic kidney disease: Secondary | ICD-10-CM | POA: Diagnosis not present

## 2020-08-09 DIAGNOSIS — E559 Vitamin D deficiency, unspecified: Secondary | ICD-10-CM | POA: Diagnosis not present

## 2020-08-09 DIAGNOSIS — Z992 Dependence on renal dialysis: Secondary | ICD-10-CM | POA: Diagnosis not present

## 2020-08-09 DIAGNOSIS — N186 End stage renal disease: Secondary | ICD-10-CM | POA: Diagnosis not present

## 2020-08-09 DIAGNOSIS — D509 Iron deficiency anemia, unspecified: Secondary | ICD-10-CM | POA: Diagnosis not present

## 2020-08-10 DIAGNOSIS — Z992 Dependence on renal dialysis: Secondary | ICD-10-CM | POA: Diagnosis not present

## 2020-08-10 DIAGNOSIS — D509 Iron deficiency anemia, unspecified: Secondary | ICD-10-CM | POA: Diagnosis not present

## 2020-08-10 DIAGNOSIS — N186 End stage renal disease: Secondary | ICD-10-CM | POA: Diagnosis not present

## 2020-08-10 DIAGNOSIS — D631 Anemia in chronic kidney disease: Secondary | ICD-10-CM | POA: Diagnosis not present

## 2020-08-10 DIAGNOSIS — E559 Vitamin D deficiency, unspecified: Secondary | ICD-10-CM | POA: Diagnosis not present

## 2020-08-11 DIAGNOSIS — D509 Iron deficiency anemia, unspecified: Secondary | ICD-10-CM | POA: Diagnosis not present

## 2020-08-11 DIAGNOSIS — E559 Vitamin D deficiency, unspecified: Secondary | ICD-10-CM | POA: Diagnosis not present

## 2020-08-11 DIAGNOSIS — Z992 Dependence on renal dialysis: Secondary | ICD-10-CM | POA: Diagnosis not present

## 2020-08-11 DIAGNOSIS — N186 End stage renal disease: Secondary | ICD-10-CM | POA: Diagnosis not present

## 2020-08-11 DIAGNOSIS — D631 Anemia in chronic kidney disease: Secondary | ICD-10-CM | POA: Diagnosis not present

## 2020-08-12 DIAGNOSIS — E559 Vitamin D deficiency, unspecified: Secondary | ICD-10-CM | POA: Diagnosis not present

## 2020-08-12 DIAGNOSIS — D631 Anemia in chronic kidney disease: Secondary | ICD-10-CM | POA: Diagnosis not present

## 2020-08-12 DIAGNOSIS — N186 End stage renal disease: Secondary | ICD-10-CM | POA: Diagnosis not present

## 2020-08-12 DIAGNOSIS — Z992 Dependence on renal dialysis: Secondary | ICD-10-CM | POA: Diagnosis not present

## 2020-08-12 DIAGNOSIS — D509 Iron deficiency anemia, unspecified: Secondary | ICD-10-CM | POA: Diagnosis not present

## 2020-08-13 DIAGNOSIS — N186 End stage renal disease: Secondary | ICD-10-CM | POA: Diagnosis not present

## 2020-08-13 DIAGNOSIS — D631 Anemia in chronic kidney disease: Secondary | ICD-10-CM | POA: Diagnosis not present

## 2020-08-13 DIAGNOSIS — E559 Vitamin D deficiency, unspecified: Secondary | ICD-10-CM | POA: Diagnosis not present

## 2020-08-13 DIAGNOSIS — Z992 Dependence on renal dialysis: Secondary | ICD-10-CM | POA: Diagnosis not present

## 2020-08-13 DIAGNOSIS — D509 Iron deficiency anemia, unspecified: Secondary | ICD-10-CM | POA: Diagnosis not present

## 2020-08-14 DIAGNOSIS — D631 Anemia in chronic kidney disease: Secondary | ICD-10-CM | POA: Diagnosis not present

## 2020-08-14 DIAGNOSIS — N186 End stage renal disease: Secondary | ICD-10-CM | POA: Diagnosis not present

## 2020-08-14 DIAGNOSIS — E559 Vitamin D deficiency, unspecified: Secondary | ICD-10-CM | POA: Diagnosis not present

## 2020-08-14 DIAGNOSIS — Z992 Dependence on renal dialysis: Secondary | ICD-10-CM | POA: Diagnosis not present

## 2020-08-14 DIAGNOSIS — D509 Iron deficiency anemia, unspecified: Secondary | ICD-10-CM | POA: Diagnosis not present

## 2020-08-15 DIAGNOSIS — D509 Iron deficiency anemia, unspecified: Secondary | ICD-10-CM | POA: Diagnosis not present

## 2020-08-15 DIAGNOSIS — N186 End stage renal disease: Secondary | ICD-10-CM | POA: Diagnosis not present

## 2020-08-15 DIAGNOSIS — E559 Vitamin D deficiency, unspecified: Secondary | ICD-10-CM | POA: Diagnosis not present

## 2020-08-15 DIAGNOSIS — Z992 Dependence on renal dialysis: Secondary | ICD-10-CM | POA: Diagnosis not present

## 2020-08-15 DIAGNOSIS — D631 Anemia in chronic kidney disease: Secondary | ICD-10-CM | POA: Diagnosis not present

## 2020-08-16 DIAGNOSIS — E559 Vitamin D deficiency, unspecified: Secondary | ICD-10-CM | POA: Diagnosis not present

## 2020-08-16 DIAGNOSIS — N186 End stage renal disease: Secondary | ICD-10-CM | POA: Diagnosis not present

## 2020-08-16 DIAGNOSIS — Z992 Dependence on renal dialysis: Secondary | ICD-10-CM | POA: Diagnosis not present

## 2020-08-16 DIAGNOSIS — D631 Anemia in chronic kidney disease: Secondary | ICD-10-CM | POA: Diagnosis not present

## 2020-08-16 DIAGNOSIS — D509 Iron deficiency anemia, unspecified: Secondary | ICD-10-CM | POA: Diagnosis not present

## 2020-08-17 DIAGNOSIS — N186 End stage renal disease: Secondary | ICD-10-CM | POA: Diagnosis not present

## 2020-08-17 DIAGNOSIS — Z992 Dependence on renal dialysis: Secondary | ICD-10-CM | POA: Diagnosis not present

## 2020-08-17 DIAGNOSIS — D631 Anemia in chronic kidney disease: Secondary | ICD-10-CM | POA: Diagnosis not present

## 2020-08-17 DIAGNOSIS — E559 Vitamin D deficiency, unspecified: Secondary | ICD-10-CM | POA: Diagnosis not present

## 2020-08-17 DIAGNOSIS — D509 Iron deficiency anemia, unspecified: Secondary | ICD-10-CM | POA: Diagnosis not present

## 2020-08-18 DIAGNOSIS — D631 Anemia in chronic kidney disease: Secondary | ICD-10-CM | POA: Diagnosis not present

## 2020-08-18 DIAGNOSIS — E559 Vitamin D deficiency, unspecified: Secondary | ICD-10-CM | POA: Diagnosis not present

## 2020-08-18 DIAGNOSIS — N186 End stage renal disease: Secondary | ICD-10-CM | POA: Diagnosis not present

## 2020-08-18 DIAGNOSIS — D509 Iron deficiency anemia, unspecified: Secondary | ICD-10-CM | POA: Diagnosis not present

## 2020-08-18 DIAGNOSIS — Z992 Dependence on renal dialysis: Secondary | ICD-10-CM | POA: Diagnosis not present

## 2020-08-18 DIAGNOSIS — E1142 Type 2 diabetes mellitus with diabetic polyneuropathy: Secondary | ICD-10-CM | POA: Diagnosis not present

## 2020-08-19 DIAGNOSIS — D509 Iron deficiency anemia, unspecified: Secondary | ICD-10-CM | POA: Diagnosis not present

## 2020-08-19 DIAGNOSIS — N186 End stage renal disease: Secondary | ICD-10-CM | POA: Diagnosis not present

## 2020-08-19 DIAGNOSIS — D631 Anemia in chronic kidney disease: Secondary | ICD-10-CM | POA: Diagnosis not present

## 2020-08-19 DIAGNOSIS — Z992 Dependence on renal dialysis: Secondary | ICD-10-CM | POA: Diagnosis not present

## 2020-08-19 DIAGNOSIS — E559 Vitamin D deficiency, unspecified: Secondary | ICD-10-CM | POA: Diagnosis not present

## 2020-08-20 ENCOUNTER — Telehealth: Payer: Self-pay | Admitting: Oncology

## 2020-08-20 DIAGNOSIS — D631 Anemia in chronic kidney disease: Secondary | ICD-10-CM | POA: Diagnosis not present

## 2020-08-20 DIAGNOSIS — N186 End stage renal disease: Secondary | ICD-10-CM | POA: Diagnosis not present

## 2020-08-20 DIAGNOSIS — Z992 Dependence on renal dialysis: Secondary | ICD-10-CM | POA: Diagnosis not present

## 2020-08-20 DIAGNOSIS — E559 Vitamin D deficiency, unspecified: Secondary | ICD-10-CM | POA: Diagnosis not present

## 2020-08-20 DIAGNOSIS — D509 Iron deficiency anemia, unspecified: Secondary | ICD-10-CM | POA: Diagnosis not present

## 2020-08-20 NOTE — Telephone Encounter (Signed)
Future appointments have been cancelled.

## 2020-08-20 NOTE — Telephone Encounter (Signed)
Good Morning,   This patient called this morning and stated that he was receivingcare through [facility] for dialysis with Dr. Holley Raring. He requested we cancel his scheduled appts and with any concerns or questions, contact Dr. Holley Raring or the pt himself.

## 2020-08-20 NOTE — Telephone Encounter (Signed)
Anderson Malta- please cancel appts

## 2020-08-21 DIAGNOSIS — D509 Iron deficiency anemia, unspecified: Secondary | ICD-10-CM | POA: Diagnosis not present

## 2020-08-21 DIAGNOSIS — N186 End stage renal disease: Secondary | ICD-10-CM | POA: Diagnosis not present

## 2020-08-21 DIAGNOSIS — D631 Anemia in chronic kidney disease: Secondary | ICD-10-CM | POA: Diagnosis not present

## 2020-08-21 DIAGNOSIS — E559 Vitamin D deficiency, unspecified: Secondary | ICD-10-CM | POA: Diagnosis not present

## 2020-08-21 DIAGNOSIS — Z992 Dependence on renal dialysis: Secondary | ICD-10-CM | POA: Diagnosis not present

## 2020-08-22 DIAGNOSIS — Z23 Encounter for immunization: Secondary | ICD-10-CM | POA: Diagnosis not present

## 2020-08-22 DIAGNOSIS — N186 End stage renal disease: Secondary | ICD-10-CM | POA: Diagnosis not present

## 2020-08-22 DIAGNOSIS — D509 Iron deficiency anemia, unspecified: Secondary | ICD-10-CM | POA: Diagnosis not present

## 2020-08-22 DIAGNOSIS — Z992 Dependence on renal dialysis: Secondary | ICD-10-CM | POA: Diagnosis not present

## 2020-08-22 DIAGNOSIS — D631 Anemia in chronic kidney disease: Secondary | ICD-10-CM | POA: Diagnosis not present

## 2020-08-23 DIAGNOSIS — Z992 Dependence on renal dialysis: Secondary | ICD-10-CM | POA: Diagnosis not present

## 2020-08-23 DIAGNOSIS — N186 End stage renal disease: Secondary | ICD-10-CM | POA: Diagnosis not present

## 2020-08-23 DIAGNOSIS — D631 Anemia in chronic kidney disease: Secondary | ICD-10-CM | POA: Diagnosis not present

## 2020-08-23 DIAGNOSIS — Z23 Encounter for immunization: Secondary | ICD-10-CM | POA: Diagnosis not present

## 2020-08-23 DIAGNOSIS — D509 Iron deficiency anemia, unspecified: Secondary | ICD-10-CM | POA: Diagnosis not present

## 2020-08-24 DIAGNOSIS — D631 Anemia in chronic kidney disease: Secondary | ICD-10-CM | POA: Diagnosis not present

## 2020-08-24 DIAGNOSIS — N186 End stage renal disease: Secondary | ICD-10-CM | POA: Diagnosis not present

## 2020-08-24 DIAGNOSIS — Z992 Dependence on renal dialysis: Secondary | ICD-10-CM | POA: Diagnosis not present

## 2020-08-24 DIAGNOSIS — Z23 Encounter for immunization: Secondary | ICD-10-CM | POA: Diagnosis not present

## 2020-08-24 DIAGNOSIS — D509 Iron deficiency anemia, unspecified: Secondary | ICD-10-CM | POA: Diagnosis not present

## 2020-08-25 ENCOUNTER — Other Ambulatory Visit: Payer: Medicare Other

## 2020-08-25 DIAGNOSIS — Z992 Dependence on renal dialysis: Secondary | ICD-10-CM | POA: Diagnosis not present

## 2020-08-25 DIAGNOSIS — I10 Essential (primary) hypertension: Secondary | ICD-10-CM | POA: Diagnosis not present

## 2020-08-25 DIAGNOSIS — E1122 Type 2 diabetes mellitus with diabetic chronic kidney disease: Secondary | ICD-10-CM | POA: Diagnosis not present

## 2020-08-25 DIAGNOSIS — N186 End stage renal disease: Secondary | ICD-10-CM | POA: Diagnosis not present

## 2020-08-25 DIAGNOSIS — D631 Anemia in chronic kidney disease: Secondary | ICD-10-CM | POA: Diagnosis not present

## 2020-08-25 DIAGNOSIS — E1159 Type 2 diabetes mellitus with other circulatory complications: Secondary | ICD-10-CM | POA: Diagnosis not present

## 2020-08-25 DIAGNOSIS — Z23 Encounter for immunization: Secondary | ICD-10-CM | POA: Diagnosis not present

## 2020-08-25 DIAGNOSIS — Z794 Long term (current) use of insulin: Secondary | ICD-10-CM | POA: Diagnosis not present

## 2020-08-25 DIAGNOSIS — D509 Iron deficiency anemia, unspecified: Secondary | ICD-10-CM | POA: Diagnosis not present

## 2020-08-25 DIAGNOSIS — E1142 Type 2 diabetes mellitus with diabetic polyneuropathy: Secondary | ICD-10-CM | POA: Diagnosis not present

## 2020-08-26 ENCOUNTER — Telehealth: Payer: Medicare Other | Admitting: Oncology

## 2020-08-26 DIAGNOSIS — Z992 Dependence on renal dialysis: Secondary | ICD-10-CM | POA: Diagnosis not present

## 2020-08-26 DIAGNOSIS — D631 Anemia in chronic kidney disease: Secondary | ICD-10-CM | POA: Diagnosis not present

## 2020-08-26 DIAGNOSIS — Z23 Encounter for immunization: Secondary | ICD-10-CM | POA: Diagnosis not present

## 2020-08-26 DIAGNOSIS — D509 Iron deficiency anemia, unspecified: Secondary | ICD-10-CM | POA: Diagnosis not present

## 2020-08-26 DIAGNOSIS — N186 End stage renal disease: Secondary | ICD-10-CM | POA: Diagnosis not present

## 2020-08-27 DIAGNOSIS — D631 Anemia in chronic kidney disease: Secondary | ICD-10-CM | POA: Diagnosis not present

## 2020-08-27 DIAGNOSIS — D509 Iron deficiency anemia, unspecified: Secondary | ICD-10-CM | POA: Diagnosis not present

## 2020-08-27 DIAGNOSIS — N186 End stage renal disease: Secondary | ICD-10-CM | POA: Diagnosis not present

## 2020-08-27 DIAGNOSIS — Z992 Dependence on renal dialysis: Secondary | ICD-10-CM | POA: Diagnosis not present

## 2020-08-27 DIAGNOSIS — Z23 Encounter for immunization: Secondary | ICD-10-CM | POA: Diagnosis not present

## 2020-08-28 DIAGNOSIS — Z992 Dependence on renal dialysis: Secondary | ICD-10-CM | POA: Diagnosis not present

## 2020-08-28 DIAGNOSIS — D631 Anemia in chronic kidney disease: Secondary | ICD-10-CM | POA: Diagnosis not present

## 2020-08-28 DIAGNOSIS — N186 End stage renal disease: Secondary | ICD-10-CM | POA: Diagnosis not present

## 2020-08-28 DIAGNOSIS — D509 Iron deficiency anemia, unspecified: Secondary | ICD-10-CM | POA: Diagnosis not present

## 2020-08-28 DIAGNOSIS — Z23 Encounter for immunization: Secondary | ICD-10-CM | POA: Diagnosis not present

## 2020-08-29 DIAGNOSIS — N186 End stage renal disease: Secondary | ICD-10-CM | POA: Diagnosis not present

## 2020-08-29 DIAGNOSIS — Z23 Encounter for immunization: Secondary | ICD-10-CM | POA: Diagnosis not present

## 2020-08-29 DIAGNOSIS — D631 Anemia in chronic kidney disease: Secondary | ICD-10-CM | POA: Diagnosis not present

## 2020-08-29 DIAGNOSIS — D509 Iron deficiency anemia, unspecified: Secondary | ICD-10-CM | POA: Diagnosis not present

## 2020-08-29 DIAGNOSIS — Z992 Dependence on renal dialysis: Secondary | ICD-10-CM | POA: Diagnosis not present

## 2020-08-30 DIAGNOSIS — Z23 Encounter for immunization: Secondary | ICD-10-CM | POA: Diagnosis not present

## 2020-08-30 DIAGNOSIS — Z992 Dependence on renal dialysis: Secondary | ICD-10-CM | POA: Diagnosis not present

## 2020-08-30 DIAGNOSIS — N186 End stage renal disease: Secondary | ICD-10-CM | POA: Diagnosis not present

## 2020-08-30 DIAGNOSIS — D509 Iron deficiency anemia, unspecified: Secondary | ICD-10-CM | POA: Diagnosis not present

## 2020-08-30 DIAGNOSIS — D631 Anemia in chronic kidney disease: Secondary | ICD-10-CM | POA: Diagnosis not present

## 2020-08-31 DIAGNOSIS — D631 Anemia in chronic kidney disease: Secondary | ICD-10-CM | POA: Diagnosis not present

## 2020-08-31 DIAGNOSIS — N186 End stage renal disease: Secondary | ICD-10-CM | POA: Diagnosis not present

## 2020-08-31 DIAGNOSIS — Z992 Dependence on renal dialysis: Secondary | ICD-10-CM | POA: Diagnosis not present

## 2020-08-31 DIAGNOSIS — Z23 Encounter for immunization: Secondary | ICD-10-CM | POA: Diagnosis not present

## 2020-08-31 DIAGNOSIS — D509 Iron deficiency anemia, unspecified: Secondary | ICD-10-CM | POA: Diagnosis not present

## 2020-09-01 DIAGNOSIS — Z23 Encounter for immunization: Secondary | ICD-10-CM | POA: Diagnosis not present

## 2020-09-01 DIAGNOSIS — Z992 Dependence on renal dialysis: Secondary | ICD-10-CM | POA: Diagnosis not present

## 2020-09-01 DIAGNOSIS — D509 Iron deficiency anemia, unspecified: Secondary | ICD-10-CM | POA: Diagnosis not present

## 2020-09-01 DIAGNOSIS — N186 End stage renal disease: Secondary | ICD-10-CM | POA: Diagnosis not present

## 2020-09-01 DIAGNOSIS — D631 Anemia in chronic kidney disease: Secondary | ICD-10-CM | POA: Diagnosis not present

## 2020-09-02 DIAGNOSIS — Z23 Encounter for immunization: Secondary | ICD-10-CM | POA: Diagnosis not present

## 2020-09-02 DIAGNOSIS — D631 Anemia in chronic kidney disease: Secondary | ICD-10-CM | POA: Diagnosis not present

## 2020-09-02 DIAGNOSIS — D509 Iron deficiency anemia, unspecified: Secondary | ICD-10-CM | POA: Diagnosis not present

## 2020-09-02 DIAGNOSIS — N186 End stage renal disease: Secondary | ICD-10-CM | POA: Diagnosis not present

## 2020-09-02 DIAGNOSIS — Z992 Dependence on renal dialysis: Secondary | ICD-10-CM | POA: Diagnosis not present

## 2020-09-03 DIAGNOSIS — D631 Anemia in chronic kidney disease: Secondary | ICD-10-CM | POA: Diagnosis not present

## 2020-09-03 DIAGNOSIS — Z992 Dependence on renal dialysis: Secondary | ICD-10-CM | POA: Diagnosis not present

## 2020-09-03 DIAGNOSIS — N186 End stage renal disease: Secondary | ICD-10-CM | POA: Diagnosis not present

## 2020-09-03 DIAGNOSIS — Z23 Encounter for immunization: Secondary | ICD-10-CM | POA: Diagnosis not present

## 2020-09-03 DIAGNOSIS — D509 Iron deficiency anemia, unspecified: Secondary | ICD-10-CM | POA: Diagnosis not present

## 2020-09-04 DIAGNOSIS — N186 End stage renal disease: Secondary | ICD-10-CM | POA: Diagnosis not present

## 2020-09-04 DIAGNOSIS — D631 Anemia in chronic kidney disease: Secondary | ICD-10-CM | POA: Diagnosis not present

## 2020-09-04 DIAGNOSIS — D509 Iron deficiency anemia, unspecified: Secondary | ICD-10-CM | POA: Diagnosis not present

## 2020-09-04 DIAGNOSIS — Z992 Dependence on renal dialysis: Secondary | ICD-10-CM | POA: Diagnosis not present

## 2020-09-04 DIAGNOSIS — Z23 Encounter for immunization: Secondary | ICD-10-CM | POA: Diagnosis not present

## 2020-09-05 DIAGNOSIS — D631 Anemia in chronic kidney disease: Secondary | ICD-10-CM | POA: Diagnosis not present

## 2020-09-05 DIAGNOSIS — D509 Iron deficiency anemia, unspecified: Secondary | ICD-10-CM | POA: Diagnosis not present

## 2020-09-05 DIAGNOSIS — Z23 Encounter for immunization: Secondary | ICD-10-CM | POA: Diagnosis not present

## 2020-09-05 DIAGNOSIS — N186 End stage renal disease: Secondary | ICD-10-CM | POA: Diagnosis not present

## 2020-09-05 DIAGNOSIS — Z992 Dependence on renal dialysis: Secondary | ICD-10-CM | POA: Diagnosis not present

## 2020-09-06 DIAGNOSIS — N186 End stage renal disease: Secondary | ICD-10-CM | POA: Diagnosis not present

## 2020-09-06 DIAGNOSIS — D631 Anemia in chronic kidney disease: Secondary | ICD-10-CM | POA: Diagnosis not present

## 2020-09-06 DIAGNOSIS — D509 Iron deficiency anemia, unspecified: Secondary | ICD-10-CM | POA: Diagnosis not present

## 2020-09-06 DIAGNOSIS — Z23 Encounter for immunization: Secondary | ICD-10-CM | POA: Diagnosis not present

## 2020-09-06 DIAGNOSIS — Z992 Dependence on renal dialysis: Secondary | ICD-10-CM | POA: Diagnosis not present

## 2020-09-07 DIAGNOSIS — N186 End stage renal disease: Secondary | ICD-10-CM | POA: Diagnosis not present

## 2020-09-07 DIAGNOSIS — Z992 Dependence on renal dialysis: Secondary | ICD-10-CM | POA: Diagnosis not present

## 2020-09-07 DIAGNOSIS — D631 Anemia in chronic kidney disease: Secondary | ICD-10-CM | POA: Diagnosis not present

## 2020-09-07 DIAGNOSIS — Z23 Encounter for immunization: Secondary | ICD-10-CM | POA: Diagnosis not present

## 2020-09-07 DIAGNOSIS — D509 Iron deficiency anemia, unspecified: Secondary | ICD-10-CM | POA: Diagnosis not present

## 2020-09-08 DIAGNOSIS — Z23 Encounter for immunization: Secondary | ICD-10-CM | POA: Diagnosis not present

## 2020-09-08 DIAGNOSIS — N186 End stage renal disease: Secondary | ICD-10-CM | POA: Diagnosis not present

## 2020-09-08 DIAGNOSIS — D631 Anemia in chronic kidney disease: Secondary | ICD-10-CM | POA: Diagnosis not present

## 2020-09-08 DIAGNOSIS — D509 Iron deficiency anemia, unspecified: Secondary | ICD-10-CM | POA: Diagnosis not present

## 2020-09-08 DIAGNOSIS — Z992 Dependence on renal dialysis: Secondary | ICD-10-CM | POA: Diagnosis not present

## 2020-09-09 DIAGNOSIS — N186 End stage renal disease: Secondary | ICD-10-CM | POA: Diagnosis not present

## 2020-09-09 DIAGNOSIS — Z992 Dependence on renal dialysis: Secondary | ICD-10-CM | POA: Diagnosis not present

## 2020-09-09 DIAGNOSIS — D631 Anemia in chronic kidney disease: Secondary | ICD-10-CM | POA: Diagnosis not present

## 2020-09-09 DIAGNOSIS — Z23 Encounter for immunization: Secondary | ICD-10-CM | POA: Diagnosis not present

## 2020-09-09 DIAGNOSIS — D509 Iron deficiency anemia, unspecified: Secondary | ICD-10-CM | POA: Diagnosis not present

## 2020-09-10 DIAGNOSIS — Z992 Dependence on renal dialysis: Secondary | ICD-10-CM | POA: Diagnosis not present

## 2020-09-10 DIAGNOSIS — N186 End stage renal disease: Secondary | ICD-10-CM | POA: Diagnosis not present

## 2020-09-10 DIAGNOSIS — D631 Anemia in chronic kidney disease: Secondary | ICD-10-CM | POA: Diagnosis not present

## 2020-09-10 DIAGNOSIS — D509 Iron deficiency anemia, unspecified: Secondary | ICD-10-CM | POA: Diagnosis not present

## 2020-09-10 DIAGNOSIS — Z23 Encounter for immunization: Secondary | ICD-10-CM | POA: Diagnosis not present

## 2020-09-11 DIAGNOSIS — N186 End stage renal disease: Secondary | ICD-10-CM | POA: Diagnosis not present

## 2020-09-11 DIAGNOSIS — Z992 Dependence on renal dialysis: Secondary | ICD-10-CM | POA: Diagnosis not present

## 2020-09-11 DIAGNOSIS — D509 Iron deficiency anemia, unspecified: Secondary | ICD-10-CM | POA: Diagnosis not present

## 2020-09-11 DIAGNOSIS — Z23 Encounter for immunization: Secondary | ICD-10-CM | POA: Diagnosis not present

## 2020-09-11 DIAGNOSIS — D631 Anemia in chronic kidney disease: Secondary | ICD-10-CM | POA: Diagnosis not present

## 2020-09-12 DIAGNOSIS — Z23 Encounter for immunization: Secondary | ICD-10-CM | POA: Diagnosis not present

## 2020-09-12 DIAGNOSIS — N186 End stage renal disease: Secondary | ICD-10-CM | POA: Diagnosis not present

## 2020-09-12 DIAGNOSIS — D509 Iron deficiency anemia, unspecified: Secondary | ICD-10-CM | POA: Diagnosis not present

## 2020-09-12 DIAGNOSIS — Z992 Dependence on renal dialysis: Secondary | ICD-10-CM | POA: Diagnosis not present

## 2020-09-12 DIAGNOSIS — D631 Anemia in chronic kidney disease: Secondary | ICD-10-CM | POA: Diagnosis not present

## 2020-09-13 DIAGNOSIS — N186 End stage renal disease: Secondary | ICD-10-CM | POA: Diagnosis not present

## 2020-09-13 DIAGNOSIS — D509 Iron deficiency anemia, unspecified: Secondary | ICD-10-CM | POA: Diagnosis not present

## 2020-09-13 DIAGNOSIS — Z23 Encounter for immunization: Secondary | ICD-10-CM | POA: Diagnosis not present

## 2020-09-13 DIAGNOSIS — D631 Anemia in chronic kidney disease: Secondary | ICD-10-CM | POA: Diagnosis not present

## 2020-09-13 DIAGNOSIS — Z992 Dependence on renal dialysis: Secondary | ICD-10-CM | POA: Diagnosis not present

## 2020-09-14 DIAGNOSIS — Z23 Encounter for immunization: Secondary | ICD-10-CM | POA: Diagnosis not present

## 2020-09-14 DIAGNOSIS — D509 Iron deficiency anemia, unspecified: Secondary | ICD-10-CM | POA: Diagnosis not present

## 2020-09-14 DIAGNOSIS — Z992 Dependence on renal dialysis: Secondary | ICD-10-CM | POA: Diagnosis not present

## 2020-09-14 DIAGNOSIS — D631 Anemia in chronic kidney disease: Secondary | ICD-10-CM | POA: Diagnosis not present

## 2020-09-14 DIAGNOSIS — N186 End stage renal disease: Secondary | ICD-10-CM | POA: Diagnosis not present

## 2020-09-15 ENCOUNTER — Ambulatory Visit: Payer: Medicare Other

## 2020-09-15 DIAGNOSIS — N186 End stage renal disease: Secondary | ICD-10-CM | POA: Diagnosis not present

## 2020-09-15 DIAGNOSIS — D509 Iron deficiency anemia, unspecified: Secondary | ICD-10-CM | POA: Diagnosis not present

## 2020-09-15 DIAGNOSIS — Z992 Dependence on renal dialysis: Secondary | ICD-10-CM | POA: Diagnosis not present

## 2020-09-15 DIAGNOSIS — D631 Anemia in chronic kidney disease: Secondary | ICD-10-CM | POA: Diagnosis not present

## 2020-09-15 DIAGNOSIS — Z23 Encounter for immunization: Secondary | ICD-10-CM | POA: Diagnosis not present

## 2020-09-16 DIAGNOSIS — Z992 Dependence on renal dialysis: Secondary | ICD-10-CM | POA: Diagnosis not present

## 2020-09-16 DIAGNOSIS — Z23 Encounter for immunization: Secondary | ICD-10-CM | POA: Diagnosis not present

## 2020-09-16 DIAGNOSIS — D631 Anemia in chronic kidney disease: Secondary | ICD-10-CM | POA: Diagnosis not present

## 2020-09-16 DIAGNOSIS — N186 End stage renal disease: Secondary | ICD-10-CM | POA: Diagnosis not present

## 2020-09-16 DIAGNOSIS — D509 Iron deficiency anemia, unspecified: Secondary | ICD-10-CM | POA: Diagnosis not present

## 2020-09-17 DIAGNOSIS — D509 Iron deficiency anemia, unspecified: Secondary | ICD-10-CM | POA: Diagnosis not present

## 2020-09-17 DIAGNOSIS — Z23 Encounter for immunization: Secondary | ICD-10-CM | POA: Diagnosis not present

## 2020-09-17 DIAGNOSIS — Z992 Dependence on renal dialysis: Secondary | ICD-10-CM | POA: Diagnosis not present

## 2020-09-17 DIAGNOSIS — N186 End stage renal disease: Secondary | ICD-10-CM | POA: Diagnosis not present

## 2020-09-17 DIAGNOSIS — D631 Anemia in chronic kidney disease: Secondary | ICD-10-CM | POA: Diagnosis not present

## 2020-09-18 DIAGNOSIS — D509 Iron deficiency anemia, unspecified: Secondary | ICD-10-CM | POA: Diagnosis not present

## 2020-09-18 DIAGNOSIS — Z992 Dependence on renal dialysis: Secondary | ICD-10-CM | POA: Diagnosis not present

## 2020-09-18 DIAGNOSIS — D631 Anemia in chronic kidney disease: Secondary | ICD-10-CM | POA: Diagnosis not present

## 2020-09-18 DIAGNOSIS — N186 End stage renal disease: Secondary | ICD-10-CM | POA: Diagnosis not present

## 2020-09-18 DIAGNOSIS — Z23 Encounter for immunization: Secondary | ICD-10-CM | POA: Diagnosis not present

## 2020-09-19 DIAGNOSIS — N186 End stage renal disease: Secondary | ICD-10-CM | POA: Diagnosis not present

## 2020-09-19 DIAGNOSIS — D631 Anemia in chronic kidney disease: Secondary | ICD-10-CM | POA: Diagnosis not present

## 2020-09-19 DIAGNOSIS — Z23 Encounter for immunization: Secondary | ICD-10-CM | POA: Diagnosis not present

## 2020-09-19 DIAGNOSIS — Z992 Dependence on renal dialysis: Secondary | ICD-10-CM | POA: Diagnosis not present

## 2020-09-19 DIAGNOSIS — D509 Iron deficiency anemia, unspecified: Secondary | ICD-10-CM | POA: Diagnosis not present

## 2020-09-20 DIAGNOSIS — D509 Iron deficiency anemia, unspecified: Secondary | ICD-10-CM | POA: Diagnosis not present

## 2020-09-20 DIAGNOSIS — D631 Anemia in chronic kidney disease: Secondary | ICD-10-CM | POA: Diagnosis not present

## 2020-09-20 DIAGNOSIS — N186 End stage renal disease: Secondary | ICD-10-CM | POA: Diagnosis not present

## 2020-09-20 DIAGNOSIS — Z992 Dependence on renal dialysis: Secondary | ICD-10-CM | POA: Diagnosis not present

## 2020-09-20 DIAGNOSIS — Z23 Encounter for immunization: Secondary | ICD-10-CM | POA: Diagnosis not present

## 2020-09-21 DIAGNOSIS — D631 Anemia in chronic kidney disease: Secondary | ICD-10-CM | POA: Diagnosis not present

## 2020-09-21 DIAGNOSIS — Z992 Dependence on renal dialysis: Secondary | ICD-10-CM | POA: Diagnosis not present

## 2020-09-21 DIAGNOSIS — Z23 Encounter for immunization: Secondary | ICD-10-CM | POA: Diagnosis not present

## 2020-09-21 DIAGNOSIS — D509 Iron deficiency anemia, unspecified: Secondary | ICD-10-CM | POA: Diagnosis not present

## 2020-09-21 DIAGNOSIS — N186 End stage renal disease: Secondary | ICD-10-CM | POA: Diagnosis not present

## 2020-09-22 DIAGNOSIS — N186 End stage renal disease: Secondary | ICD-10-CM | POA: Diagnosis not present

## 2020-09-22 DIAGNOSIS — Z992 Dependence on renal dialysis: Secondary | ICD-10-CM | POA: Diagnosis not present

## 2020-09-22 DIAGNOSIS — D509 Iron deficiency anemia, unspecified: Secondary | ICD-10-CM | POA: Diagnosis not present

## 2020-09-22 DIAGNOSIS — Z23 Encounter for immunization: Secondary | ICD-10-CM | POA: Diagnosis not present

## 2020-09-23 DIAGNOSIS — D509 Iron deficiency anemia, unspecified: Secondary | ICD-10-CM | POA: Diagnosis not present

## 2020-09-23 DIAGNOSIS — N186 End stage renal disease: Secondary | ICD-10-CM | POA: Diagnosis not present

## 2020-09-23 DIAGNOSIS — Z992 Dependence on renal dialysis: Secondary | ICD-10-CM | POA: Diagnosis not present

## 2020-09-23 DIAGNOSIS — Z23 Encounter for immunization: Secondary | ICD-10-CM | POA: Diagnosis not present

## 2020-09-24 DIAGNOSIS — Z992 Dependence on renal dialysis: Secondary | ICD-10-CM | POA: Diagnosis not present

## 2020-09-24 DIAGNOSIS — D509 Iron deficiency anemia, unspecified: Secondary | ICD-10-CM | POA: Diagnosis not present

## 2020-09-24 DIAGNOSIS — N186 End stage renal disease: Secondary | ICD-10-CM | POA: Diagnosis not present

## 2020-09-24 DIAGNOSIS — Z23 Encounter for immunization: Secondary | ICD-10-CM | POA: Diagnosis not present

## 2020-09-25 DIAGNOSIS — N186 End stage renal disease: Secondary | ICD-10-CM | POA: Diagnosis not present

## 2020-09-25 DIAGNOSIS — Z23 Encounter for immunization: Secondary | ICD-10-CM | POA: Diagnosis not present

## 2020-09-25 DIAGNOSIS — Z992 Dependence on renal dialysis: Secondary | ICD-10-CM | POA: Diagnosis not present

## 2020-09-25 DIAGNOSIS — D509 Iron deficiency anemia, unspecified: Secondary | ICD-10-CM | POA: Diagnosis not present

## 2020-09-26 DIAGNOSIS — N186 End stage renal disease: Secondary | ICD-10-CM | POA: Diagnosis not present

## 2020-09-26 DIAGNOSIS — D509 Iron deficiency anemia, unspecified: Secondary | ICD-10-CM | POA: Diagnosis not present

## 2020-09-26 DIAGNOSIS — Z992 Dependence on renal dialysis: Secondary | ICD-10-CM | POA: Diagnosis not present

## 2020-09-26 DIAGNOSIS — Z23 Encounter for immunization: Secondary | ICD-10-CM | POA: Diagnosis not present

## 2020-09-27 DIAGNOSIS — Z992 Dependence on renal dialysis: Secondary | ICD-10-CM | POA: Diagnosis not present

## 2020-09-27 DIAGNOSIS — Z23 Encounter for immunization: Secondary | ICD-10-CM | POA: Diagnosis not present

## 2020-09-27 DIAGNOSIS — N186 End stage renal disease: Secondary | ICD-10-CM | POA: Diagnosis not present

## 2020-09-27 DIAGNOSIS — D509 Iron deficiency anemia, unspecified: Secondary | ICD-10-CM | POA: Diagnosis not present

## 2020-09-28 DIAGNOSIS — Z992 Dependence on renal dialysis: Secondary | ICD-10-CM | POA: Diagnosis not present

## 2020-09-28 DIAGNOSIS — Z23 Encounter for immunization: Secondary | ICD-10-CM | POA: Diagnosis not present

## 2020-09-28 DIAGNOSIS — N186 End stage renal disease: Secondary | ICD-10-CM | POA: Diagnosis not present

## 2020-09-28 DIAGNOSIS — D509 Iron deficiency anemia, unspecified: Secondary | ICD-10-CM | POA: Diagnosis not present

## 2020-09-29 DIAGNOSIS — Z992 Dependence on renal dialysis: Secondary | ICD-10-CM | POA: Diagnosis not present

## 2020-09-29 DIAGNOSIS — D509 Iron deficiency anemia, unspecified: Secondary | ICD-10-CM | POA: Diagnosis not present

## 2020-09-29 DIAGNOSIS — Z23 Encounter for immunization: Secondary | ICD-10-CM | POA: Diagnosis not present

## 2020-09-29 DIAGNOSIS — N186 End stage renal disease: Secondary | ICD-10-CM | POA: Diagnosis not present

## 2020-09-30 DIAGNOSIS — D509 Iron deficiency anemia, unspecified: Secondary | ICD-10-CM | POA: Diagnosis not present

## 2020-09-30 DIAGNOSIS — Z992 Dependence on renal dialysis: Secondary | ICD-10-CM | POA: Diagnosis not present

## 2020-09-30 DIAGNOSIS — N186 End stage renal disease: Secondary | ICD-10-CM | POA: Diagnosis not present

## 2020-09-30 DIAGNOSIS — Z23 Encounter for immunization: Secondary | ICD-10-CM | POA: Diagnosis not present

## 2020-10-01 DIAGNOSIS — N186 End stage renal disease: Secondary | ICD-10-CM | POA: Diagnosis not present

## 2020-10-01 DIAGNOSIS — Z23 Encounter for immunization: Secondary | ICD-10-CM | POA: Diagnosis not present

## 2020-10-01 DIAGNOSIS — Z992 Dependence on renal dialysis: Secondary | ICD-10-CM | POA: Diagnosis not present

## 2020-10-01 DIAGNOSIS — D509 Iron deficiency anemia, unspecified: Secondary | ICD-10-CM | POA: Diagnosis not present

## 2020-10-02 DIAGNOSIS — Z23 Encounter for immunization: Secondary | ICD-10-CM | POA: Diagnosis not present

## 2020-10-02 DIAGNOSIS — Z992 Dependence on renal dialysis: Secondary | ICD-10-CM | POA: Diagnosis not present

## 2020-10-02 DIAGNOSIS — N186 End stage renal disease: Secondary | ICD-10-CM | POA: Diagnosis not present

## 2020-10-02 DIAGNOSIS — D509 Iron deficiency anemia, unspecified: Secondary | ICD-10-CM | POA: Diagnosis not present

## 2020-10-03 DIAGNOSIS — D509 Iron deficiency anemia, unspecified: Secondary | ICD-10-CM | POA: Diagnosis not present

## 2020-10-03 DIAGNOSIS — Z992 Dependence on renal dialysis: Secondary | ICD-10-CM | POA: Diagnosis not present

## 2020-10-03 DIAGNOSIS — N186 End stage renal disease: Secondary | ICD-10-CM | POA: Diagnosis not present

## 2020-10-03 DIAGNOSIS — Z23 Encounter for immunization: Secondary | ICD-10-CM | POA: Diagnosis not present

## 2020-10-04 DIAGNOSIS — D509 Iron deficiency anemia, unspecified: Secondary | ICD-10-CM | POA: Diagnosis not present

## 2020-10-04 DIAGNOSIS — Z23 Encounter for immunization: Secondary | ICD-10-CM | POA: Diagnosis not present

## 2020-10-04 DIAGNOSIS — Z992 Dependence on renal dialysis: Secondary | ICD-10-CM | POA: Diagnosis not present

## 2020-10-04 DIAGNOSIS — N186 End stage renal disease: Secondary | ICD-10-CM | POA: Diagnosis not present

## 2020-10-05 DIAGNOSIS — Z992 Dependence on renal dialysis: Secondary | ICD-10-CM | POA: Diagnosis not present

## 2020-10-05 DIAGNOSIS — D509 Iron deficiency anemia, unspecified: Secondary | ICD-10-CM | POA: Diagnosis not present

## 2020-10-05 DIAGNOSIS — Z23 Encounter for immunization: Secondary | ICD-10-CM | POA: Diagnosis not present

## 2020-10-05 DIAGNOSIS — N186 End stage renal disease: Secondary | ICD-10-CM | POA: Diagnosis not present

## 2020-10-06 DIAGNOSIS — Z23 Encounter for immunization: Secondary | ICD-10-CM | POA: Diagnosis not present

## 2020-10-06 DIAGNOSIS — D509 Iron deficiency anemia, unspecified: Secondary | ICD-10-CM | POA: Diagnosis not present

## 2020-10-06 DIAGNOSIS — Z992 Dependence on renal dialysis: Secondary | ICD-10-CM | POA: Diagnosis not present

## 2020-10-06 DIAGNOSIS — N186 End stage renal disease: Secondary | ICD-10-CM | POA: Diagnosis not present

## 2020-10-07 DIAGNOSIS — N186 End stage renal disease: Secondary | ICD-10-CM | POA: Diagnosis not present

## 2020-10-07 DIAGNOSIS — D509 Iron deficiency anemia, unspecified: Secondary | ICD-10-CM | POA: Diagnosis not present

## 2020-10-07 DIAGNOSIS — Z23 Encounter for immunization: Secondary | ICD-10-CM | POA: Diagnosis not present

## 2020-10-07 DIAGNOSIS — Z992 Dependence on renal dialysis: Secondary | ICD-10-CM | POA: Diagnosis not present

## 2020-10-08 DIAGNOSIS — D509 Iron deficiency anemia, unspecified: Secondary | ICD-10-CM | POA: Diagnosis not present

## 2020-10-08 DIAGNOSIS — N186 End stage renal disease: Secondary | ICD-10-CM | POA: Diagnosis not present

## 2020-10-08 DIAGNOSIS — Z23 Encounter for immunization: Secondary | ICD-10-CM | POA: Diagnosis not present

## 2020-10-08 DIAGNOSIS — Z992 Dependence on renal dialysis: Secondary | ICD-10-CM | POA: Diagnosis not present

## 2020-10-09 DIAGNOSIS — Z992 Dependence on renal dialysis: Secondary | ICD-10-CM | POA: Diagnosis not present

## 2020-10-09 DIAGNOSIS — Z23 Encounter for immunization: Secondary | ICD-10-CM | POA: Diagnosis not present

## 2020-10-09 DIAGNOSIS — N186 End stage renal disease: Secondary | ICD-10-CM | POA: Diagnosis not present

## 2020-10-09 DIAGNOSIS — D509 Iron deficiency anemia, unspecified: Secondary | ICD-10-CM | POA: Diagnosis not present

## 2020-10-10 DIAGNOSIS — D509 Iron deficiency anemia, unspecified: Secondary | ICD-10-CM | POA: Diagnosis not present

## 2020-10-10 DIAGNOSIS — N186 End stage renal disease: Secondary | ICD-10-CM | POA: Diagnosis not present

## 2020-10-10 DIAGNOSIS — Z23 Encounter for immunization: Secondary | ICD-10-CM | POA: Diagnosis not present

## 2020-10-10 DIAGNOSIS — Z992 Dependence on renal dialysis: Secondary | ICD-10-CM | POA: Diagnosis not present

## 2020-10-11 DIAGNOSIS — Z23 Encounter for immunization: Secondary | ICD-10-CM | POA: Diagnosis not present

## 2020-10-11 DIAGNOSIS — D509 Iron deficiency anemia, unspecified: Secondary | ICD-10-CM | POA: Diagnosis not present

## 2020-10-11 DIAGNOSIS — Z992 Dependence on renal dialysis: Secondary | ICD-10-CM | POA: Diagnosis not present

## 2020-10-11 DIAGNOSIS — N186 End stage renal disease: Secondary | ICD-10-CM | POA: Diagnosis not present

## 2020-10-12 DIAGNOSIS — D509 Iron deficiency anemia, unspecified: Secondary | ICD-10-CM | POA: Diagnosis not present

## 2020-10-12 DIAGNOSIS — Z992 Dependence on renal dialysis: Secondary | ICD-10-CM | POA: Diagnosis not present

## 2020-10-12 DIAGNOSIS — N186 End stage renal disease: Secondary | ICD-10-CM | POA: Diagnosis not present

## 2020-10-12 DIAGNOSIS — Z23 Encounter for immunization: Secondary | ICD-10-CM | POA: Diagnosis not present

## 2020-10-13 DIAGNOSIS — N186 End stage renal disease: Secondary | ICD-10-CM | POA: Diagnosis not present

## 2020-10-13 DIAGNOSIS — Z23 Encounter for immunization: Secondary | ICD-10-CM | POA: Diagnosis not present

## 2020-10-13 DIAGNOSIS — Z992 Dependence on renal dialysis: Secondary | ICD-10-CM | POA: Diagnosis not present

## 2020-10-13 DIAGNOSIS — D509 Iron deficiency anemia, unspecified: Secondary | ICD-10-CM | POA: Diagnosis not present

## 2020-10-14 DIAGNOSIS — Z23 Encounter for immunization: Secondary | ICD-10-CM | POA: Diagnosis not present

## 2020-10-14 DIAGNOSIS — N186 End stage renal disease: Secondary | ICD-10-CM | POA: Diagnosis not present

## 2020-10-14 DIAGNOSIS — Z992 Dependence on renal dialysis: Secondary | ICD-10-CM | POA: Diagnosis not present

## 2020-10-14 DIAGNOSIS — D509 Iron deficiency anemia, unspecified: Secondary | ICD-10-CM | POA: Diagnosis not present

## 2020-10-15 DIAGNOSIS — Z23 Encounter for immunization: Secondary | ICD-10-CM | POA: Diagnosis not present

## 2020-10-15 DIAGNOSIS — Z992 Dependence on renal dialysis: Secondary | ICD-10-CM | POA: Diagnosis not present

## 2020-10-15 DIAGNOSIS — D509 Iron deficiency anemia, unspecified: Secondary | ICD-10-CM | POA: Diagnosis not present

## 2020-10-15 DIAGNOSIS — N186 End stage renal disease: Secondary | ICD-10-CM | POA: Diagnosis not present

## 2020-10-16 DIAGNOSIS — D509 Iron deficiency anemia, unspecified: Secondary | ICD-10-CM | POA: Diagnosis not present

## 2020-10-16 DIAGNOSIS — Z23 Encounter for immunization: Secondary | ICD-10-CM | POA: Diagnosis not present

## 2020-10-16 DIAGNOSIS — N186 End stage renal disease: Secondary | ICD-10-CM | POA: Diagnosis not present

## 2020-10-16 DIAGNOSIS — Z992 Dependence on renal dialysis: Secondary | ICD-10-CM | POA: Diagnosis not present

## 2020-10-17 DIAGNOSIS — Z992 Dependence on renal dialysis: Secondary | ICD-10-CM | POA: Diagnosis not present

## 2020-10-17 DIAGNOSIS — D509 Iron deficiency anemia, unspecified: Secondary | ICD-10-CM | POA: Diagnosis not present

## 2020-10-17 DIAGNOSIS — N186 End stage renal disease: Secondary | ICD-10-CM | POA: Diagnosis not present

## 2020-10-17 DIAGNOSIS — Z23 Encounter for immunization: Secondary | ICD-10-CM | POA: Diagnosis not present

## 2020-10-18 DIAGNOSIS — Z992 Dependence on renal dialysis: Secondary | ICD-10-CM | POA: Diagnosis not present

## 2020-10-18 DIAGNOSIS — N186 End stage renal disease: Secondary | ICD-10-CM | POA: Diagnosis not present

## 2020-10-18 DIAGNOSIS — D509 Iron deficiency anemia, unspecified: Secondary | ICD-10-CM | POA: Diagnosis not present

## 2020-10-18 DIAGNOSIS — Z23 Encounter for immunization: Secondary | ICD-10-CM | POA: Diagnosis not present

## 2020-10-19 DIAGNOSIS — Z23 Encounter for immunization: Secondary | ICD-10-CM | POA: Diagnosis not present

## 2020-10-19 DIAGNOSIS — N186 End stage renal disease: Secondary | ICD-10-CM | POA: Diagnosis not present

## 2020-10-19 DIAGNOSIS — D509 Iron deficiency anemia, unspecified: Secondary | ICD-10-CM | POA: Diagnosis not present

## 2020-10-19 DIAGNOSIS — Z992 Dependence on renal dialysis: Secondary | ICD-10-CM | POA: Diagnosis not present

## 2020-10-20 DIAGNOSIS — N186 End stage renal disease: Secondary | ICD-10-CM | POA: Diagnosis not present

## 2020-10-20 DIAGNOSIS — Z992 Dependence on renal dialysis: Secondary | ICD-10-CM | POA: Diagnosis not present

## 2020-10-20 DIAGNOSIS — D509 Iron deficiency anemia, unspecified: Secondary | ICD-10-CM | POA: Diagnosis not present

## 2020-10-20 DIAGNOSIS — Z23 Encounter for immunization: Secondary | ICD-10-CM | POA: Diagnosis not present

## 2020-10-21 DIAGNOSIS — N186 End stage renal disease: Secondary | ICD-10-CM | POA: Diagnosis not present

## 2020-10-21 DIAGNOSIS — Z23 Encounter for immunization: Secondary | ICD-10-CM | POA: Diagnosis not present

## 2020-10-21 DIAGNOSIS — Z992 Dependence on renal dialysis: Secondary | ICD-10-CM | POA: Diagnosis not present

## 2020-10-21 DIAGNOSIS — D509 Iron deficiency anemia, unspecified: Secondary | ICD-10-CM | POA: Diagnosis not present

## 2020-10-22 DIAGNOSIS — D509 Iron deficiency anemia, unspecified: Secondary | ICD-10-CM | POA: Diagnosis not present

## 2020-10-22 DIAGNOSIS — E559 Vitamin D deficiency, unspecified: Secondary | ICD-10-CM | POA: Diagnosis not present

## 2020-10-22 DIAGNOSIS — N186 End stage renal disease: Secondary | ICD-10-CM | POA: Diagnosis not present

## 2020-10-22 DIAGNOSIS — Z23 Encounter for immunization: Secondary | ICD-10-CM | POA: Diagnosis not present

## 2020-10-22 DIAGNOSIS — Z992 Dependence on renal dialysis: Secondary | ICD-10-CM | POA: Diagnosis not present

## 2020-10-22 DIAGNOSIS — D631 Anemia in chronic kidney disease: Secondary | ICD-10-CM | POA: Diagnosis not present

## 2020-10-23 DIAGNOSIS — Z992 Dependence on renal dialysis: Secondary | ICD-10-CM | POA: Diagnosis not present

## 2020-10-23 DIAGNOSIS — N186 End stage renal disease: Secondary | ICD-10-CM | POA: Diagnosis not present

## 2020-10-23 DIAGNOSIS — D509 Iron deficiency anemia, unspecified: Secondary | ICD-10-CM | POA: Diagnosis not present

## 2020-10-23 DIAGNOSIS — D631 Anemia in chronic kidney disease: Secondary | ICD-10-CM | POA: Diagnosis not present

## 2020-10-23 DIAGNOSIS — E559 Vitamin D deficiency, unspecified: Secondary | ICD-10-CM | POA: Diagnosis not present

## 2020-10-23 DIAGNOSIS — Z23 Encounter for immunization: Secondary | ICD-10-CM | POA: Diagnosis not present

## 2020-10-24 DIAGNOSIS — D509 Iron deficiency anemia, unspecified: Secondary | ICD-10-CM | POA: Diagnosis not present

## 2020-10-24 DIAGNOSIS — N186 End stage renal disease: Secondary | ICD-10-CM | POA: Diagnosis not present

## 2020-10-24 DIAGNOSIS — Z23 Encounter for immunization: Secondary | ICD-10-CM | POA: Diagnosis not present

## 2020-10-24 DIAGNOSIS — Z992 Dependence on renal dialysis: Secondary | ICD-10-CM | POA: Diagnosis not present

## 2020-10-24 DIAGNOSIS — E559 Vitamin D deficiency, unspecified: Secondary | ICD-10-CM | POA: Diagnosis not present

## 2020-10-24 DIAGNOSIS — D631 Anemia in chronic kidney disease: Secondary | ICD-10-CM | POA: Diagnosis not present

## 2020-10-25 DIAGNOSIS — E559 Vitamin D deficiency, unspecified: Secondary | ICD-10-CM | POA: Diagnosis not present

## 2020-10-25 DIAGNOSIS — Z23 Encounter for immunization: Secondary | ICD-10-CM | POA: Diagnosis not present

## 2020-10-25 DIAGNOSIS — D509 Iron deficiency anemia, unspecified: Secondary | ICD-10-CM | POA: Diagnosis not present

## 2020-10-25 DIAGNOSIS — D631 Anemia in chronic kidney disease: Secondary | ICD-10-CM | POA: Diagnosis not present

## 2020-10-25 DIAGNOSIS — N186 End stage renal disease: Secondary | ICD-10-CM | POA: Diagnosis not present

## 2020-10-25 DIAGNOSIS — Z992 Dependence on renal dialysis: Secondary | ICD-10-CM | POA: Diagnosis not present

## 2020-10-26 DIAGNOSIS — D509 Iron deficiency anemia, unspecified: Secondary | ICD-10-CM | POA: Diagnosis not present

## 2020-10-26 DIAGNOSIS — Z23 Encounter for immunization: Secondary | ICD-10-CM | POA: Diagnosis not present

## 2020-10-26 DIAGNOSIS — Z992 Dependence on renal dialysis: Secondary | ICD-10-CM | POA: Diagnosis not present

## 2020-10-26 DIAGNOSIS — E559 Vitamin D deficiency, unspecified: Secondary | ICD-10-CM | POA: Diagnosis not present

## 2020-10-26 DIAGNOSIS — D631 Anemia in chronic kidney disease: Secondary | ICD-10-CM | POA: Diagnosis not present

## 2020-10-26 DIAGNOSIS — N186 End stage renal disease: Secondary | ICD-10-CM | POA: Diagnosis not present

## 2020-10-27 DIAGNOSIS — D509 Iron deficiency anemia, unspecified: Secondary | ICD-10-CM | POA: Diagnosis not present

## 2020-10-27 DIAGNOSIS — N186 End stage renal disease: Secondary | ICD-10-CM | POA: Diagnosis not present

## 2020-10-27 DIAGNOSIS — E559 Vitamin D deficiency, unspecified: Secondary | ICD-10-CM | POA: Diagnosis not present

## 2020-10-27 DIAGNOSIS — Z23 Encounter for immunization: Secondary | ICD-10-CM | POA: Diagnosis not present

## 2020-10-27 DIAGNOSIS — D631 Anemia in chronic kidney disease: Secondary | ICD-10-CM | POA: Diagnosis not present

## 2020-10-27 DIAGNOSIS — Z992 Dependence on renal dialysis: Secondary | ICD-10-CM | POA: Diagnosis not present

## 2020-10-28 DIAGNOSIS — Z992 Dependence on renal dialysis: Secondary | ICD-10-CM | POA: Diagnosis not present

## 2020-10-28 DIAGNOSIS — D509 Iron deficiency anemia, unspecified: Secondary | ICD-10-CM | POA: Diagnosis not present

## 2020-10-28 DIAGNOSIS — E559 Vitamin D deficiency, unspecified: Secondary | ICD-10-CM | POA: Diagnosis not present

## 2020-10-28 DIAGNOSIS — Z23 Encounter for immunization: Secondary | ICD-10-CM | POA: Diagnosis not present

## 2020-10-28 DIAGNOSIS — N186 End stage renal disease: Secondary | ICD-10-CM | POA: Diagnosis not present

## 2020-10-28 DIAGNOSIS — D631 Anemia in chronic kidney disease: Secondary | ICD-10-CM | POA: Diagnosis not present

## 2020-10-29 DIAGNOSIS — Z23 Encounter for immunization: Secondary | ICD-10-CM | POA: Diagnosis not present

## 2020-10-29 DIAGNOSIS — D509 Iron deficiency anemia, unspecified: Secondary | ICD-10-CM | POA: Diagnosis not present

## 2020-10-29 DIAGNOSIS — N186 End stage renal disease: Secondary | ICD-10-CM | POA: Diagnosis not present

## 2020-10-29 DIAGNOSIS — E559 Vitamin D deficiency, unspecified: Secondary | ICD-10-CM | POA: Diagnosis not present

## 2020-10-29 DIAGNOSIS — D631 Anemia in chronic kidney disease: Secondary | ICD-10-CM | POA: Diagnosis not present

## 2020-10-29 DIAGNOSIS — Z992 Dependence on renal dialysis: Secondary | ICD-10-CM | POA: Diagnosis not present

## 2020-10-30 DIAGNOSIS — E559 Vitamin D deficiency, unspecified: Secondary | ICD-10-CM | POA: Diagnosis not present

## 2020-10-30 DIAGNOSIS — D509 Iron deficiency anemia, unspecified: Secondary | ICD-10-CM | POA: Diagnosis not present

## 2020-10-30 DIAGNOSIS — Z23 Encounter for immunization: Secondary | ICD-10-CM | POA: Diagnosis not present

## 2020-10-30 DIAGNOSIS — N186 End stage renal disease: Secondary | ICD-10-CM | POA: Diagnosis not present

## 2020-10-30 DIAGNOSIS — Z992 Dependence on renal dialysis: Secondary | ICD-10-CM | POA: Diagnosis not present

## 2020-10-30 DIAGNOSIS — D631 Anemia in chronic kidney disease: Secondary | ICD-10-CM | POA: Diagnosis not present

## 2020-10-31 DIAGNOSIS — Z992 Dependence on renal dialysis: Secondary | ICD-10-CM | POA: Diagnosis not present

## 2020-10-31 DIAGNOSIS — N186 End stage renal disease: Secondary | ICD-10-CM | POA: Diagnosis not present

## 2020-10-31 DIAGNOSIS — Z23 Encounter for immunization: Secondary | ICD-10-CM | POA: Diagnosis not present

## 2020-10-31 DIAGNOSIS — E559 Vitamin D deficiency, unspecified: Secondary | ICD-10-CM | POA: Diagnosis not present

## 2020-10-31 DIAGNOSIS — D509 Iron deficiency anemia, unspecified: Secondary | ICD-10-CM | POA: Diagnosis not present

## 2020-10-31 DIAGNOSIS — D631 Anemia in chronic kidney disease: Secondary | ICD-10-CM | POA: Diagnosis not present

## 2020-11-01 DIAGNOSIS — E559 Vitamin D deficiency, unspecified: Secondary | ICD-10-CM | POA: Diagnosis not present

## 2020-11-01 DIAGNOSIS — D509 Iron deficiency anemia, unspecified: Secondary | ICD-10-CM | POA: Diagnosis not present

## 2020-11-01 DIAGNOSIS — N186 End stage renal disease: Secondary | ICD-10-CM | POA: Diagnosis not present

## 2020-11-01 DIAGNOSIS — Z23 Encounter for immunization: Secondary | ICD-10-CM | POA: Diagnosis not present

## 2020-11-01 DIAGNOSIS — Z992 Dependence on renal dialysis: Secondary | ICD-10-CM | POA: Diagnosis not present

## 2020-11-01 DIAGNOSIS — D631 Anemia in chronic kidney disease: Secondary | ICD-10-CM | POA: Diagnosis not present

## 2020-11-02 DIAGNOSIS — E559 Vitamin D deficiency, unspecified: Secondary | ICD-10-CM | POA: Diagnosis not present

## 2020-11-02 DIAGNOSIS — Z23 Encounter for immunization: Secondary | ICD-10-CM | POA: Diagnosis not present

## 2020-11-02 DIAGNOSIS — E119 Type 2 diabetes mellitus without complications: Secondary | ICD-10-CM | POA: Diagnosis not present

## 2020-11-02 DIAGNOSIS — Z794 Long term (current) use of insulin: Secondary | ICD-10-CM | POA: Diagnosis not present

## 2020-11-02 DIAGNOSIS — D631 Anemia in chronic kidney disease: Secondary | ICD-10-CM | POA: Diagnosis not present

## 2020-11-02 DIAGNOSIS — D509 Iron deficiency anemia, unspecified: Secondary | ICD-10-CM | POA: Diagnosis not present

## 2020-11-02 DIAGNOSIS — N186 End stage renal disease: Secondary | ICD-10-CM | POA: Diagnosis not present

## 2020-11-02 DIAGNOSIS — Z992 Dependence on renal dialysis: Secondary | ICD-10-CM | POA: Diagnosis not present

## 2020-11-03 DIAGNOSIS — E559 Vitamin D deficiency, unspecified: Secondary | ICD-10-CM | POA: Diagnosis not present

## 2020-11-03 DIAGNOSIS — N186 End stage renal disease: Secondary | ICD-10-CM | POA: Diagnosis not present

## 2020-11-03 DIAGNOSIS — D631 Anemia in chronic kidney disease: Secondary | ICD-10-CM | POA: Diagnosis not present

## 2020-11-03 DIAGNOSIS — Z23 Encounter for immunization: Secondary | ICD-10-CM | POA: Diagnosis not present

## 2020-11-03 DIAGNOSIS — D509 Iron deficiency anemia, unspecified: Secondary | ICD-10-CM | POA: Diagnosis not present

## 2020-11-03 DIAGNOSIS — Z992 Dependence on renal dialysis: Secondary | ICD-10-CM | POA: Diagnosis not present

## 2020-11-04 DIAGNOSIS — N186 End stage renal disease: Secondary | ICD-10-CM | POA: Diagnosis not present

## 2020-11-04 DIAGNOSIS — D631 Anemia in chronic kidney disease: Secondary | ICD-10-CM | POA: Diagnosis not present

## 2020-11-04 DIAGNOSIS — E559 Vitamin D deficiency, unspecified: Secondary | ICD-10-CM | POA: Diagnosis not present

## 2020-11-04 DIAGNOSIS — D509 Iron deficiency anemia, unspecified: Secondary | ICD-10-CM | POA: Diagnosis not present

## 2020-11-04 DIAGNOSIS — Z23 Encounter for immunization: Secondary | ICD-10-CM | POA: Diagnosis not present

## 2020-11-04 DIAGNOSIS — Z992 Dependence on renal dialysis: Secondary | ICD-10-CM | POA: Diagnosis not present

## 2020-11-05 DIAGNOSIS — Z992 Dependence on renal dialysis: Secondary | ICD-10-CM | POA: Diagnosis not present

## 2020-11-05 DIAGNOSIS — N186 End stage renal disease: Secondary | ICD-10-CM | POA: Diagnosis not present

## 2020-11-05 DIAGNOSIS — Z23 Encounter for immunization: Secondary | ICD-10-CM | POA: Diagnosis not present

## 2020-11-05 DIAGNOSIS — E559 Vitamin D deficiency, unspecified: Secondary | ICD-10-CM | POA: Diagnosis not present

## 2020-11-05 DIAGNOSIS — D509 Iron deficiency anemia, unspecified: Secondary | ICD-10-CM | POA: Diagnosis not present

## 2020-11-05 DIAGNOSIS — D631 Anemia in chronic kidney disease: Secondary | ICD-10-CM | POA: Diagnosis not present

## 2020-11-06 DIAGNOSIS — D509 Iron deficiency anemia, unspecified: Secondary | ICD-10-CM | POA: Diagnosis not present

## 2020-11-06 DIAGNOSIS — Z992 Dependence on renal dialysis: Secondary | ICD-10-CM | POA: Diagnosis not present

## 2020-11-06 DIAGNOSIS — Z23 Encounter for immunization: Secondary | ICD-10-CM | POA: Diagnosis not present

## 2020-11-06 DIAGNOSIS — N186 End stage renal disease: Secondary | ICD-10-CM | POA: Diagnosis not present

## 2020-11-06 DIAGNOSIS — D631 Anemia in chronic kidney disease: Secondary | ICD-10-CM | POA: Diagnosis not present

## 2020-11-06 DIAGNOSIS — E559 Vitamin D deficiency, unspecified: Secondary | ICD-10-CM | POA: Diagnosis not present

## 2020-11-07 DIAGNOSIS — D631 Anemia in chronic kidney disease: Secondary | ICD-10-CM | POA: Diagnosis not present

## 2020-11-07 DIAGNOSIS — Z23 Encounter for immunization: Secondary | ICD-10-CM | POA: Diagnosis not present

## 2020-11-07 DIAGNOSIS — E559 Vitamin D deficiency, unspecified: Secondary | ICD-10-CM | POA: Diagnosis not present

## 2020-11-07 DIAGNOSIS — N186 End stage renal disease: Secondary | ICD-10-CM | POA: Diagnosis not present

## 2020-11-07 DIAGNOSIS — Z992 Dependence on renal dialysis: Secondary | ICD-10-CM | POA: Diagnosis not present

## 2020-11-07 DIAGNOSIS — D509 Iron deficiency anemia, unspecified: Secondary | ICD-10-CM | POA: Diagnosis not present

## 2020-11-08 DIAGNOSIS — D509 Iron deficiency anemia, unspecified: Secondary | ICD-10-CM | POA: Diagnosis not present

## 2020-11-08 DIAGNOSIS — N186 End stage renal disease: Secondary | ICD-10-CM | POA: Diagnosis not present

## 2020-11-08 DIAGNOSIS — Z992 Dependence on renal dialysis: Secondary | ICD-10-CM | POA: Diagnosis not present

## 2020-11-08 DIAGNOSIS — D631 Anemia in chronic kidney disease: Secondary | ICD-10-CM | POA: Diagnosis not present

## 2020-11-08 DIAGNOSIS — Z23 Encounter for immunization: Secondary | ICD-10-CM | POA: Diagnosis not present

## 2020-11-08 DIAGNOSIS — E559 Vitamin D deficiency, unspecified: Secondary | ICD-10-CM | POA: Diagnosis not present

## 2020-11-09 DIAGNOSIS — Z23 Encounter for immunization: Secondary | ICD-10-CM | POA: Diagnosis not present

## 2020-11-09 DIAGNOSIS — Z992 Dependence on renal dialysis: Secondary | ICD-10-CM | POA: Diagnosis not present

## 2020-11-09 DIAGNOSIS — D631 Anemia in chronic kidney disease: Secondary | ICD-10-CM | POA: Diagnosis not present

## 2020-11-09 DIAGNOSIS — N186 End stage renal disease: Secondary | ICD-10-CM | POA: Diagnosis not present

## 2020-11-09 DIAGNOSIS — E559 Vitamin D deficiency, unspecified: Secondary | ICD-10-CM | POA: Diagnosis not present

## 2020-11-09 DIAGNOSIS — D509 Iron deficiency anemia, unspecified: Secondary | ICD-10-CM | POA: Diagnosis not present

## 2020-11-10 DIAGNOSIS — Z23 Encounter for immunization: Secondary | ICD-10-CM | POA: Diagnosis not present

## 2020-11-10 DIAGNOSIS — D631 Anemia in chronic kidney disease: Secondary | ICD-10-CM | POA: Diagnosis not present

## 2020-11-10 DIAGNOSIS — E559 Vitamin D deficiency, unspecified: Secondary | ICD-10-CM | POA: Diagnosis not present

## 2020-11-10 DIAGNOSIS — E1122 Type 2 diabetes mellitus with diabetic chronic kidney disease: Secondary | ICD-10-CM | POA: Diagnosis not present

## 2020-11-10 DIAGNOSIS — I120 Hypertensive chronic kidney disease with stage 5 chronic kidney disease or end stage renal disease: Secondary | ICD-10-CM | POA: Diagnosis not present

## 2020-11-10 DIAGNOSIS — Z992 Dependence on renal dialysis: Secondary | ICD-10-CM | POA: Diagnosis not present

## 2020-11-10 DIAGNOSIS — D509 Iron deficiency anemia, unspecified: Secondary | ICD-10-CM | POA: Diagnosis not present

## 2020-11-10 DIAGNOSIS — I25810 Atherosclerosis of coronary artery bypass graft(s) without angina pectoris: Secondary | ICD-10-CM | POA: Diagnosis not present

## 2020-11-10 DIAGNOSIS — D500 Iron deficiency anemia secondary to blood loss (chronic): Secondary | ICD-10-CM | POA: Diagnosis not present

## 2020-11-10 DIAGNOSIS — Z794 Long term (current) use of insulin: Secondary | ICD-10-CM | POA: Diagnosis not present

## 2020-11-10 DIAGNOSIS — N186 End stage renal disease: Secondary | ICD-10-CM | POA: Diagnosis not present

## 2020-11-11 DIAGNOSIS — N186 End stage renal disease: Secondary | ICD-10-CM | POA: Diagnosis not present

## 2020-11-11 DIAGNOSIS — D509 Iron deficiency anemia, unspecified: Secondary | ICD-10-CM | POA: Diagnosis not present

## 2020-11-11 DIAGNOSIS — Z992 Dependence on renal dialysis: Secondary | ICD-10-CM | POA: Diagnosis not present

## 2020-11-11 DIAGNOSIS — E559 Vitamin D deficiency, unspecified: Secondary | ICD-10-CM | POA: Diagnosis not present

## 2020-11-11 DIAGNOSIS — D631 Anemia in chronic kidney disease: Secondary | ICD-10-CM | POA: Diagnosis not present

## 2020-11-11 DIAGNOSIS — Z23 Encounter for immunization: Secondary | ICD-10-CM | POA: Diagnosis not present

## 2020-11-12 DIAGNOSIS — Z992 Dependence on renal dialysis: Secondary | ICD-10-CM | POA: Diagnosis not present

## 2020-11-12 DIAGNOSIS — N186 End stage renal disease: Secondary | ICD-10-CM | POA: Diagnosis not present

## 2020-11-12 DIAGNOSIS — D631 Anemia in chronic kidney disease: Secondary | ICD-10-CM | POA: Diagnosis not present

## 2020-11-12 DIAGNOSIS — Z23 Encounter for immunization: Secondary | ICD-10-CM | POA: Diagnosis not present

## 2020-11-12 DIAGNOSIS — D509 Iron deficiency anemia, unspecified: Secondary | ICD-10-CM | POA: Diagnosis not present

## 2020-11-12 DIAGNOSIS — E559 Vitamin D deficiency, unspecified: Secondary | ICD-10-CM | POA: Diagnosis not present

## 2020-11-13 DIAGNOSIS — D509 Iron deficiency anemia, unspecified: Secondary | ICD-10-CM | POA: Diagnosis not present

## 2020-11-13 DIAGNOSIS — D631 Anemia in chronic kidney disease: Secondary | ICD-10-CM | POA: Diagnosis not present

## 2020-11-13 DIAGNOSIS — N186 End stage renal disease: Secondary | ICD-10-CM | POA: Diagnosis not present

## 2020-11-13 DIAGNOSIS — E559 Vitamin D deficiency, unspecified: Secondary | ICD-10-CM | POA: Diagnosis not present

## 2020-11-13 DIAGNOSIS — Z23 Encounter for immunization: Secondary | ICD-10-CM | POA: Diagnosis not present

## 2020-11-13 DIAGNOSIS — Z992 Dependence on renal dialysis: Secondary | ICD-10-CM | POA: Diagnosis not present

## 2020-11-14 DIAGNOSIS — E559 Vitamin D deficiency, unspecified: Secondary | ICD-10-CM | POA: Diagnosis not present

## 2020-11-14 DIAGNOSIS — D509 Iron deficiency anemia, unspecified: Secondary | ICD-10-CM | POA: Diagnosis not present

## 2020-11-14 DIAGNOSIS — Z23 Encounter for immunization: Secondary | ICD-10-CM | POA: Diagnosis not present

## 2020-11-14 DIAGNOSIS — D631 Anemia in chronic kidney disease: Secondary | ICD-10-CM | POA: Diagnosis not present

## 2020-11-14 DIAGNOSIS — N186 End stage renal disease: Secondary | ICD-10-CM | POA: Diagnosis not present

## 2020-11-14 DIAGNOSIS — Z992 Dependence on renal dialysis: Secondary | ICD-10-CM | POA: Diagnosis not present

## 2020-11-15 DIAGNOSIS — N186 End stage renal disease: Secondary | ICD-10-CM | POA: Diagnosis not present

## 2020-11-15 DIAGNOSIS — D631 Anemia in chronic kidney disease: Secondary | ICD-10-CM | POA: Diagnosis not present

## 2020-11-15 DIAGNOSIS — D509 Iron deficiency anemia, unspecified: Secondary | ICD-10-CM | POA: Diagnosis not present

## 2020-11-15 DIAGNOSIS — Z992 Dependence on renal dialysis: Secondary | ICD-10-CM | POA: Diagnosis not present

## 2020-11-15 DIAGNOSIS — Z23 Encounter for immunization: Secondary | ICD-10-CM | POA: Diagnosis not present

## 2020-11-15 DIAGNOSIS — E559 Vitamin D deficiency, unspecified: Secondary | ICD-10-CM | POA: Diagnosis not present

## 2020-11-16 DIAGNOSIS — R0602 Shortness of breath: Secondary | ICD-10-CM | POA: Diagnosis not present

## 2020-11-16 DIAGNOSIS — E559 Vitamin D deficiency, unspecified: Secondary | ICD-10-CM | POA: Diagnosis not present

## 2020-11-16 DIAGNOSIS — Z992 Dependence on renal dialysis: Secondary | ICD-10-CM | POA: Diagnosis not present

## 2020-11-16 DIAGNOSIS — Z23 Encounter for immunization: Secondary | ICD-10-CM | POA: Diagnosis not present

## 2020-11-16 DIAGNOSIS — N186 End stage renal disease: Secondary | ICD-10-CM | POA: Diagnosis not present

## 2020-11-16 DIAGNOSIS — D631 Anemia in chronic kidney disease: Secondary | ICD-10-CM | POA: Diagnosis not present

## 2020-11-16 DIAGNOSIS — D509 Iron deficiency anemia, unspecified: Secondary | ICD-10-CM | POA: Diagnosis not present

## 2020-11-17 DIAGNOSIS — N186 End stage renal disease: Secondary | ICD-10-CM | POA: Diagnosis not present

## 2020-11-17 DIAGNOSIS — Z23 Encounter for immunization: Secondary | ICD-10-CM | POA: Diagnosis not present

## 2020-11-17 DIAGNOSIS — Z992 Dependence on renal dialysis: Secondary | ICD-10-CM | POA: Diagnosis not present

## 2020-11-17 DIAGNOSIS — D631 Anemia in chronic kidney disease: Secondary | ICD-10-CM | POA: Diagnosis not present

## 2020-11-17 DIAGNOSIS — E559 Vitamin D deficiency, unspecified: Secondary | ICD-10-CM | POA: Diagnosis not present

## 2020-11-17 DIAGNOSIS — D509 Iron deficiency anemia, unspecified: Secondary | ICD-10-CM | POA: Diagnosis not present

## 2020-11-18 DIAGNOSIS — E559 Vitamin D deficiency, unspecified: Secondary | ICD-10-CM | POA: Diagnosis not present

## 2020-11-18 DIAGNOSIS — Z23 Encounter for immunization: Secondary | ICD-10-CM | POA: Diagnosis not present

## 2020-11-18 DIAGNOSIS — D509 Iron deficiency anemia, unspecified: Secondary | ICD-10-CM | POA: Diagnosis not present

## 2020-11-18 DIAGNOSIS — D631 Anemia in chronic kidney disease: Secondary | ICD-10-CM | POA: Diagnosis not present

## 2020-11-18 DIAGNOSIS — Z992 Dependence on renal dialysis: Secondary | ICD-10-CM | POA: Diagnosis not present

## 2020-11-18 DIAGNOSIS — N186 End stage renal disease: Secondary | ICD-10-CM | POA: Diagnosis not present

## 2020-11-19 DIAGNOSIS — D631 Anemia in chronic kidney disease: Secondary | ICD-10-CM | POA: Diagnosis not present

## 2020-11-19 DIAGNOSIS — E559 Vitamin D deficiency, unspecified: Secondary | ICD-10-CM | POA: Diagnosis not present

## 2020-11-19 DIAGNOSIS — Z23 Encounter for immunization: Secondary | ICD-10-CM | POA: Diagnosis not present

## 2020-11-19 DIAGNOSIS — Z992 Dependence on renal dialysis: Secondary | ICD-10-CM | POA: Diagnosis not present

## 2020-11-19 DIAGNOSIS — D509 Iron deficiency anemia, unspecified: Secondary | ICD-10-CM | POA: Diagnosis not present

## 2020-11-19 DIAGNOSIS — N186 End stage renal disease: Secondary | ICD-10-CM | POA: Diagnosis not present

## 2020-11-20 DIAGNOSIS — E559 Vitamin D deficiency, unspecified: Secondary | ICD-10-CM | POA: Diagnosis not present

## 2020-11-20 DIAGNOSIS — D509 Iron deficiency anemia, unspecified: Secondary | ICD-10-CM | POA: Diagnosis not present

## 2020-11-20 DIAGNOSIS — N186 End stage renal disease: Secondary | ICD-10-CM | POA: Diagnosis not present

## 2020-11-20 DIAGNOSIS — Z23 Encounter for immunization: Secondary | ICD-10-CM | POA: Diagnosis not present

## 2020-11-20 DIAGNOSIS — D631 Anemia in chronic kidney disease: Secondary | ICD-10-CM | POA: Diagnosis not present

## 2020-11-20 DIAGNOSIS — Z992 Dependence on renal dialysis: Secondary | ICD-10-CM | POA: Diagnosis not present

## 2020-11-21 DIAGNOSIS — Z992 Dependence on renal dialysis: Secondary | ICD-10-CM | POA: Diagnosis not present

## 2020-11-21 DIAGNOSIS — Z23 Encounter for immunization: Secondary | ICD-10-CM | POA: Diagnosis not present

## 2020-11-21 DIAGNOSIS — D509 Iron deficiency anemia, unspecified: Secondary | ICD-10-CM | POA: Diagnosis not present

## 2020-11-21 DIAGNOSIS — D631 Anemia in chronic kidney disease: Secondary | ICD-10-CM | POA: Diagnosis not present

## 2020-11-21 DIAGNOSIS — E559 Vitamin D deficiency, unspecified: Secondary | ICD-10-CM | POA: Diagnosis not present

## 2020-11-21 DIAGNOSIS — N186 End stage renal disease: Secondary | ICD-10-CM | POA: Diagnosis not present

## 2020-11-22 DIAGNOSIS — Z992 Dependence on renal dialysis: Secondary | ICD-10-CM | POA: Diagnosis not present

## 2020-11-22 DIAGNOSIS — D509 Iron deficiency anemia, unspecified: Secondary | ICD-10-CM | POA: Diagnosis not present

## 2020-11-22 DIAGNOSIS — E559 Vitamin D deficiency, unspecified: Secondary | ICD-10-CM | POA: Diagnosis not present

## 2020-11-22 DIAGNOSIS — E119 Type 2 diabetes mellitus without complications: Secondary | ICD-10-CM | POA: Diagnosis not present

## 2020-11-22 DIAGNOSIS — D631 Anemia in chronic kidney disease: Secondary | ICD-10-CM | POA: Diagnosis not present

## 2020-11-22 DIAGNOSIS — N186 End stage renal disease: Secondary | ICD-10-CM | POA: Diagnosis not present

## 2020-11-23 DIAGNOSIS — Z992 Dependence on renal dialysis: Secondary | ICD-10-CM | POA: Diagnosis not present

## 2020-11-23 DIAGNOSIS — E559 Vitamin D deficiency, unspecified: Secondary | ICD-10-CM | POA: Diagnosis not present

## 2020-11-23 DIAGNOSIS — N186 End stage renal disease: Secondary | ICD-10-CM | POA: Diagnosis not present

## 2020-11-23 DIAGNOSIS — D631 Anemia in chronic kidney disease: Secondary | ICD-10-CM | POA: Diagnosis not present

## 2020-11-23 DIAGNOSIS — D509 Iron deficiency anemia, unspecified: Secondary | ICD-10-CM | POA: Diagnosis not present

## 2020-11-24 DIAGNOSIS — N186 End stage renal disease: Secondary | ICD-10-CM | POA: Diagnosis not present

## 2020-11-24 DIAGNOSIS — Z992 Dependence on renal dialysis: Secondary | ICD-10-CM | POA: Diagnosis not present

## 2020-11-24 DIAGNOSIS — D509 Iron deficiency anemia, unspecified: Secondary | ICD-10-CM | POA: Diagnosis not present

## 2020-11-24 DIAGNOSIS — D631 Anemia in chronic kidney disease: Secondary | ICD-10-CM | POA: Diagnosis not present

## 2020-11-24 DIAGNOSIS — E559 Vitamin D deficiency, unspecified: Secondary | ICD-10-CM | POA: Diagnosis not present

## 2020-11-25 DIAGNOSIS — E559 Vitamin D deficiency, unspecified: Secondary | ICD-10-CM | POA: Diagnosis not present

## 2020-11-25 DIAGNOSIS — D631 Anemia in chronic kidney disease: Secondary | ICD-10-CM | POA: Diagnosis not present

## 2020-11-25 DIAGNOSIS — N186 End stage renal disease: Secondary | ICD-10-CM | POA: Diagnosis not present

## 2020-11-25 DIAGNOSIS — D509 Iron deficiency anemia, unspecified: Secondary | ICD-10-CM | POA: Diagnosis not present

## 2020-11-25 DIAGNOSIS — Z992 Dependence on renal dialysis: Secondary | ICD-10-CM | POA: Diagnosis not present

## 2020-11-26 DIAGNOSIS — Z992 Dependence on renal dialysis: Secondary | ICD-10-CM | POA: Diagnosis not present

## 2020-11-26 DIAGNOSIS — N186 End stage renal disease: Secondary | ICD-10-CM | POA: Diagnosis not present

## 2020-11-26 DIAGNOSIS — D509 Iron deficiency anemia, unspecified: Secondary | ICD-10-CM | POA: Diagnosis not present

## 2020-11-26 DIAGNOSIS — D631 Anemia in chronic kidney disease: Secondary | ICD-10-CM | POA: Diagnosis not present

## 2020-11-26 DIAGNOSIS — E559 Vitamin D deficiency, unspecified: Secondary | ICD-10-CM | POA: Diagnosis not present

## 2020-11-27 DIAGNOSIS — N186 End stage renal disease: Secondary | ICD-10-CM | POA: Diagnosis not present

## 2020-11-27 DIAGNOSIS — D509 Iron deficiency anemia, unspecified: Secondary | ICD-10-CM | POA: Diagnosis not present

## 2020-11-27 DIAGNOSIS — E559 Vitamin D deficiency, unspecified: Secondary | ICD-10-CM | POA: Diagnosis not present

## 2020-11-27 DIAGNOSIS — Z992 Dependence on renal dialysis: Secondary | ICD-10-CM | POA: Diagnosis not present

## 2020-11-27 DIAGNOSIS — D631 Anemia in chronic kidney disease: Secondary | ICD-10-CM | POA: Diagnosis not present

## 2020-11-28 DIAGNOSIS — D509 Iron deficiency anemia, unspecified: Secondary | ICD-10-CM | POA: Diagnosis not present

## 2020-11-28 DIAGNOSIS — N186 End stage renal disease: Secondary | ICD-10-CM | POA: Diagnosis not present

## 2020-11-28 DIAGNOSIS — Z992 Dependence on renal dialysis: Secondary | ICD-10-CM | POA: Diagnosis not present

## 2020-11-28 DIAGNOSIS — E559 Vitamin D deficiency, unspecified: Secondary | ICD-10-CM | POA: Diagnosis not present

## 2020-11-28 DIAGNOSIS — D631 Anemia in chronic kidney disease: Secondary | ICD-10-CM | POA: Diagnosis not present

## 2020-11-29 DIAGNOSIS — N186 End stage renal disease: Secondary | ICD-10-CM | POA: Diagnosis not present

## 2020-11-29 DIAGNOSIS — D631 Anemia in chronic kidney disease: Secondary | ICD-10-CM | POA: Diagnosis not present

## 2020-11-29 DIAGNOSIS — D509 Iron deficiency anemia, unspecified: Secondary | ICD-10-CM | POA: Diagnosis not present

## 2020-11-29 DIAGNOSIS — E559 Vitamin D deficiency, unspecified: Secondary | ICD-10-CM | POA: Diagnosis not present

## 2020-11-29 DIAGNOSIS — Z992 Dependence on renal dialysis: Secondary | ICD-10-CM | POA: Diagnosis not present

## 2020-11-30 DIAGNOSIS — D631 Anemia in chronic kidney disease: Secondary | ICD-10-CM | POA: Diagnosis not present

## 2020-11-30 DIAGNOSIS — D509 Iron deficiency anemia, unspecified: Secondary | ICD-10-CM | POA: Diagnosis not present

## 2020-11-30 DIAGNOSIS — N186 End stage renal disease: Secondary | ICD-10-CM | POA: Diagnosis not present

## 2020-11-30 DIAGNOSIS — E559 Vitamin D deficiency, unspecified: Secondary | ICD-10-CM | POA: Diagnosis not present

## 2020-11-30 DIAGNOSIS — Z992 Dependence on renal dialysis: Secondary | ICD-10-CM | POA: Diagnosis not present

## 2020-12-01 DIAGNOSIS — D509 Iron deficiency anemia, unspecified: Secondary | ICD-10-CM | POA: Diagnosis not present

## 2020-12-01 DIAGNOSIS — N186 End stage renal disease: Secondary | ICD-10-CM | POA: Diagnosis not present

## 2020-12-01 DIAGNOSIS — D631 Anemia in chronic kidney disease: Secondary | ICD-10-CM | POA: Diagnosis not present

## 2020-12-01 DIAGNOSIS — Z992 Dependence on renal dialysis: Secondary | ICD-10-CM | POA: Diagnosis not present

## 2020-12-01 DIAGNOSIS — E559 Vitamin D deficiency, unspecified: Secondary | ICD-10-CM | POA: Diagnosis not present

## 2020-12-02 DIAGNOSIS — D509 Iron deficiency anemia, unspecified: Secondary | ICD-10-CM | POA: Diagnosis not present

## 2020-12-02 DIAGNOSIS — D631 Anemia in chronic kidney disease: Secondary | ICD-10-CM | POA: Diagnosis not present

## 2020-12-02 DIAGNOSIS — E559 Vitamin D deficiency, unspecified: Secondary | ICD-10-CM | POA: Diagnosis not present

## 2020-12-02 DIAGNOSIS — Z992 Dependence on renal dialysis: Secondary | ICD-10-CM | POA: Diagnosis not present

## 2020-12-02 DIAGNOSIS — N186 End stage renal disease: Secondary | ICD-10-CM | POA: Diagnosis not present

## 2020-12-03 DIAGNOSIS — Z992 Dependence on renal dialysis: Secondary | ICD-10-CM | POA: Diagnosis not present

## 2020-12-03 DIAGNOSIS — D509 Iron deficiency anemia, unspecified: Secondary | ICD-10-CM | POA: Diagnosis not present

## 2020-12-03 DIAGNOSIS — N186 End stage renal disease: Secondary | ICD-10-CM | POA: Diagnosis not present

## 2020-12-03 DIAGNOSIS — E559 Vitamin D deficiency, unspecified: Secondary | ICD-10-CM | POA: Diagnosis not present

## 2020-12-03 DIAGNOSIS — D631 Anemia in chronic kidney disease: Secondary | ICD-10-CM | POA: Diagnosis not present

## 2020-12-04 DIAGNOSIS — D509 Iron deficiency anemia, unspecified: Secondary | ICD-10-CM | POA: Diagnosis not present

## 2020-12-04 DIAGNOSIS — D631 Anemia in chronic kidney disease: Secondary | ICD-10-CM | POA: Diagnosis not present

## 2020-12-04 DIAGNOSIS — Z992 Dependence on renal dialysis: Secondary | ICD-10-CM | POA: Diagnosis not present

## 2020-12-04 DIAGNOSIS — N186 End stage renal disease: Secondary | ICD-10-CM | POA: Diagnosis not present

## 2020-12-04 DIAGNOSIS — E559 Vitamin D deficiency, unspecified: Secondary | ICD-10-CM | POA: Diagnosis not present

## 2020-12-05 DIAGNOSIS — E559 Vitamin D deficiency, unspecified: Secondary | ICD-10-CM | POA: Diagnosis not present

## 2020-12-05 DIAGNOSIS — N186 End stage renal disease: Secondary | ICD-10-CM | POA: Diagnosis not present

## 2020-12-05 DIAGNOSIS — Z992 Dependence on renal dialysis: Secondary | ICD-10-CM | POA: Diagnosis not present

## 2020-12-05 DIAGNOSIS — D631 Anemia in chronic kidney disease: Secondary | ICD-10-CM | POA: Diagnosis not present

## 2020-12-05 DIAGNOSIS — D509 Iron deficiency anemia, unspecified: Secondary | ICD-10-CM | POA: Diagnosis not present

## 2020-12-06 DIAGNOSIS — N186 End stage renal disease: Secondary | ICD-10-CM | POA: Diagnosis not present

## 2020-12-06 DIAGNOSIS — D631 Anemia in chronic kidney disease: Secondary | ICD-10-CM | POA: Diagnosis not present

## 2020-12-06 DIAGNOSIS — Z992 Dependence on renal dialysis: Secondary | ICD-10-CM | POA: Diagnosis not present

## 2020-12-06 DIAGNOSIS — D509 Iron deficiency anemia, unspecified: Secondary | ICD-10-CM | POA: Diagnosis not present

## 2020-12-06 DIAGNOSIS — E559 Vitamin D deficiency, unspecified: Secondary | ICD-10-CM | POA: Diagnosis not present

## 2020-12-07 DIAGNOSIS — Z992 Dependence on renal dialysis: Secondary | ICD-10-CM | POA: Diagnosis not present

## 2020-12-07 DIAGNOSIS — D509 Iron deficiency anemia, unspecified: Secondary | ICD-10-CM | POA: Diagnosis not present

## 2020-12-07 DIAGNOSIS — N186 End stage renal disease: Secondary | ICD-10-CM | POA: Diagnosis not present

## 2020-12-07 DIAGNOSIS — E559 Vitamin D deficiency, unspecified: Secondary | ICD-10-CM | POA: Diagnosis not present

## 2020-12-07 DIAGNOSIS — D631 Anemia in chronic kidney disease: Secondary | ICD-10-CM | POA: Diagnosis not present

## 2020-12-08 DIAGNOSIS — D631 Anemia in chronic kidney disease: Secondary | ICD-10-CM | POA: Diagnosis not present

## 2020-12-08 DIAGNOSIS — D509 Iron deficiency anemia, unspecified: Secondary | ICD-10-CM | POA: Diagnosis not present

## 2020-12-08 DIAGNOSIS — Z992 Dependence on renal dialysis: Secondary | ICD-10-CM | POA: Diagnosis not present

## 2020-12-08 DIAGNOSIS — E559 Vitamin D deficiency, unspecified: Secondary | ICD-10-CM | POA: Diagnosis not present

## 2020-12-08 DIAGNOSIS — N186 End stage renal disease: Secondary | ICD-10-CM | POA: Diagnosis not present

## 2020-12-09 DIAGNOSIS — D509 Iron deficiency anemia, unspecified: Secondary | ICD-10-CM | POA: Diagnosis not present

## 2020-12-09 DIAGNOSIS — N186 End stage renal disease: Secondary | ICD-10-CM | POA: Diagnosis not present

## 2020-12-09 DIAGNOSIS — E559 Vitamin D deficiency, unspecified: Secondary | ICD-10-CM | POA: Diagnosis not present

## 2020-12-09 DIAGNOSIS — Z992 Dependence on renal dialysis: Secondary | ICD-10-CM | POA: Diagnosis not present

## 2020-12-09 DIAGNOSIS — D631 Anemia in chronic kidney disease: Secondary | ICD-10-CM | POA: Diagnosis not present

## 2020-12-10 DIAGNOSIS — D631 Anemia in chronic kidney disease: Secondary | ICD-10-CM | POA: Diagnosis not present

## 2020-12-10 DIAGNOSIS — N186 End stage renal disease: Secondary | ICD-10-CM | POA: Diagnosis not present

## 2020-12-10 DIAGNOSIS — Z992 Dependence on renal dialysis: Secondary | ICD-10-CM | POA: Diagnosis not present

## 2020-12-10 DIAGNOSIS — E559 Vitamin D deficiency, unspecified: Secondary | ICD-10-CM | POA: Diagnosis not present

## 2020-12-10 DIAGNOSIS — D509 Iron deficiency anemia, unspecified: Secondary | ICD-10-CM | POA: Diagnosis not present

## 2020-12-11 DIAGNOSIS — D631 Anemia in chronic kidney disease: Secondary | ICD-10-CM | POA: Diagnosis not present

## 2020-12-11 DIAGNOSIS — D509 Iron deficiency anemia, unspecified: Secondary | ICD-10-CM | POA: Diagnosis not present

## 2020-12-11 DIAGNOSIS — Z992 Dependence on renal dialysis: Secondary | ICD-10-CM | POA: Diagnosis not present

## 2020-12-11 DIAGNOSIS — N186 End stage renal disease: Secondary | ICD-10-CM | POA: Diagnosis not present

## 2020-12-11 DIAGNOSIS — E559 Vitamin D deficiency, unspecified: Secondary | ICD-10-CM | POA: Diagnosis not present

## 2020-12-12 DIAGNOSIS — N186 End stage renal disease: Secondary | ICD-10-CM | POA: Diagnosis not present

## 2020-12-12 DIAGNOSIS — Z992 Dependence on renal dialysis: Secondary | ICD-10-CM | POA: Diagnosis not present

## 2020-12-12 DIAGNOSIS — E559 Vitamin D deficiency, unspecified: Secondary | ICD-10-CM | POA: Diagnosis not present

## 2020-12-12 DIAGNOSIS — D631 Anemia in chronic kidney disease: Secondary | ICD-10-CM | POA: Diagnosis not present

## 2020-12-12 DIAGNOSIS — D509 Iron deficiency anemia, unspecified: Secondary | ICD-10-CM | POA: Diagnosis not present

## 2020-12-13 DIAGNOSIS — D509 Iron deficiency anemia, unspecified: Secondary | ICD-10-CM | POA: Diagnosis not present

## 2020-12-13 DIAGNOSIS — N186 End stage renal disease: Secondary | ICD-10-CM | POA: Diagnosis not present

## 2020-12-13 DIAGNOSIS — Z992 Dependence on renal dialysis: Secondary | ICD-10-CM | POA: Diagnosis not present

## 2020-12-13 DIAGNOSIS — D631 Anemia in chronic kidney disease: Secondary | ICD-10-CM | POA: Diagnosis not present

## 2020-12-13 DIAGNOSIS — E559 Vitamin D deficiency, unspecified: Secondary | ICD-10-CM | POA: Diagnosis not present

## 2020-12-14 DIAGNOSIS — D509 Iron deficiency anemia, unspecified: Secondary | ICD-10-CM | POA: Diagnosis not present

## 2020-12-14 DIAGNOSIS — N186 End stage renal disease: Secondary | ICD-10-CM | POA: Diagnosis not present

## 2020-12-14 DIAGNOSIS — Z992 Dependence on renal dialysis: Secondary | ICD-10-CM | POA: Diagnosis not present

## 2020-12-14 DIAGNOSIS — E559 Vitamin D deficiency, unspecified: Secondary | ICD-10-CM | POA: Diagnosis not present

## 2020-12-14 DIAGNOSIS — D631 Anemia in chronic kidney disease: Secondary | ICD-10-CM | POA: Diagnosis not present

## 2020-12-15 DIAGNOSIS — E559 Vitamin D deficiency, unspecified: Secondary | ICD-10-CM | POA: Diagnosis not present

## 2020-12-15 DIAGNOSIS — Z992 Dependence on renal dialysis: Secondary | ICD-10-CM | POA: Diagnosis not present

## 2020-12-15 DIAGNOSIS — D509 Iron deficiency anemia, unspecified: Secondary | ICD-10-CM | POA: Diagnosis not present

## 2020-12-15 DIAGNOSIS — N186 End stage renal disease: Secondary | ICD-10-CM | POA: Diagnosis not present

## 2020-12-15 DIAGNOSIS — D631 Anemia in chronic kidney disease: Secondary | ICD-10-CM | POA: Diagnosis not present

## 2020-12-16 DIAGNOSIS — Z992 Dependence on renal dialysis: Secondary | ICD-10-CM | POA: Diagnosis not present

## 2020-12-16 DIAGNOSIS — N186 End stage renal disease: Secondary | ICD-10-CM | POA: Diagnosis not present

## 2020-12-16 DIAGNOSIS — D509 Iron deficiency anemia, unspecified: Secondary | ICD-10-CM | POA: Diagnosis not present

## 2020-12-16 DIAGNOSIS — E559 Vitamin D deficiency, unspecified: Secondary | ICD-10-CM | POA: Diagnosis not present

## 2020-12-16 DIAGNOSIS — D631 Anemia in chronic kidney disease: Secondary | ICD-10-CM | POA: Diagnosis not present

## 2020-12-17 DIAGNOSIS — N186 End stage renal disease: Secondary | ICD-10-CM | POA: Diagnosis not present

## 2020-12-17 DIAGNOSIS — E559 Vitamin D deficiency, unspecified: Secondary | ICD-10-CM | POA: Diagnosis not present

## 2020-12-17 DIAGNOSIS — Z992 Dependence on renal dialysis: Secondary | ICD-10-CM | POA: Diagnosis not present

## 2020-12-17 DIAGNOSIS — D509 Iron deficiency anemia, unspecified: Secondary | ICD-10-CM | POA: Diagnosis not present

## 2020-12-17 DIAGNOSIS — D631 Anemia in chronic kidney disease: Secondary | ICD-10-CM | POA: Diagnosis not present

## 2020-12-18 DIAGNOSIS — D631 Anemia in chronic kidney disease: Secondary | ICD-10-CM | POA: Diagnosis not present

## 2020-12-18 DIAGNOSIS — E559 Vitamin D deficiency, unspecified: Secondary | ICD-10-CM | POA: Diagnosis not present

## 2020-12-18 DIAGNOSIS — D509 Iron deficiency anemia, unspecified: Secondary | ICD-10-CM | POA: Diagnosis not present

## 2020-12-18 DIAGNOSIS — Z992 Dependence on renal dialysis: Secondary | ICD-10-CM | POA: Diagnosis not present

## 2020-12-18 DIAGNOSIS — N186 End stage renal disease: Secondary | ICD-10-CM | POA: Diagnosis not present

## 2020-12-19 DIAGNOSIS — N186 End stage renal disease: Secondary | ICD-10-CM | POA: Diagnosis not present

## 2020-12-19 DIAGNOSIS — Z992 Dependence on renal dialysis: Secondary | ICD-10-CM | POA: Diagnosis not present

## 2020-12-19 DIAGNOSIS — D509 Iron deficiency anemia, unspecified: Secondary | ICD-10-CM | POA: Diagnosis not present

## 2020-12-19 DIAGNOSIS — E559 Vitamin D deficiency, unspecified: Secondary | ICD-10-CM | POA: Diagnosis not present

## 2020-12-19 DIAGNOSIS — D631 Anemia in chronic kidney disease: Secondary | ICD-10-CM | POA: Diagnosis not present

## 2020-12-20 DIAGNOSIS — Z992 Dependence on renal dialysis: Secondary | ICD-10-CM | POA: Diagnosis not present

## 2020-12-20 DIAGNOSIS — D631 Anemia in chronic kidney disease: Secondary | ICD-10-CM | POA: Diagnosis not present

## 2020-12-20 DIAGNOSIS — E559 Vitamin D deficiency, unspecified: Secondary | ICD-10-CM | POA: Diagnosis not present

## 2020-12-20 DIAGNOSIS — D509 Iron deficiency anemia, unspecified: Secondary | ICD-10-CM | POA: Diagnosis not present

## 2020-12-20 DIAGNOSIS — N186 End stage renal disease: Secondary | ICD-10-CM | POA: Diagnosis not present

## 2020-12-21 DIAGNOSIS — Z992 Dependence on renal dialysis: Secondary | ICD-10-CM | POA: Diagnosis not present

## 2020-12-21 DIAGNOSIS — D631 Anemia in chronic kidney disease: Secondary | ICD-10-CM | POA: Diagnosis not present

## 2020-12-21 DIAGNOSIS — D509 Iron deficiency anemia, unspecified: Secondary | ICD-10-CM | POA: Diagnosis not present

## 2020-12-21 DIAGNOSIS — E559 Vitamin D deficiency, unspecified: Secondary | ICD-10-CM | POA: Diagnosis not present

## 2020-12-21 DIAGNOSIS — N186 End stage renal disease: Secondary | ICD-10-CM | POA: Diagnosis not present

## 2020-12-22 DIAGNOSIS — Z992 Dependence on renal dialysis: Secondary | ICD-10-CM | POA: Diagnosis not present

## 2020-12-22 DIAGNOSIS — D631 Anemia in chronic kidney disease: Secondary | ICD-10-CM | POA: Diagnosis not present

## 2020-12-22 DIAGNOSIS — N186 End stage renal disease: Secondary | ICD-10-CM | POA: Diagnosis not present

## 2020-12-22 DIAGNOSIS — D509 Iron deficiency anemia, unspecified: Secondary | ICD-10-CM | POA: Diagnosis not present

## 2020-12-22 DIAGNOSIS — E559 Vitamin D deficiency, unspecified: Secondary | ICD-10-CM | POA: Diagnosis not present

## 2020-12-23 DIAGNOSIS — D631 Anemia in chronic kidney disease: Secondary | ICD-10-CM | POA: Diagnosis not present

## 2020-12-23 DIAGNOSIS — N186 End stage renal disease: Secondary | ICD-10-CM | POA: Diagnosis not present

## 2020-12-23 DIAGNOSIS — D509 Iron deficiency anemia, unspecified: Secondary | ICD-10-CM | POA: Diagnosis not present

## 2020-12-23 DIAGNOSIS — Z992 Dependence on renal dialysis: Secondary | ICD-10-CM | POA: Diagnosis not present

## 2020-12-24 DIAGNOSIS — D631 Anemia in chronic kidney disease: Secondary | ICD-10-CM | POA: Diagnosis not present

## 2020-12-24 DIAGNOSIS — Z992 Dependence on renal dialysis: Secondary | ICD-10-CM | POA: Diagnosis not present

## 2020-12-24 DIAGNOSIS — D509 Iron deficiency anemia, unspecified: Secondary | ICD-10-CM | POA: Diagnosis not present

## 2020-12-24 DIAGNOSIS — N186 End stage renal disease: Secondary | ICD-10-CM | POA: Diagnosis not present

## 2020-12-25 DIAGNOSIS — D631 Anemia in chronic kidney disease: Secondary | ICD-10-CM | POA: Diagnosis not present

## 2020-12-25 DIAGNOSIS — Z992 Dependence on renal dialysis: Secondary | ICD-10-CM | POA: Diagnosis not present

## 2020-12-25 DIAGNOSIS — N186 End stage renal disease: Secondary | ICD-10-CM | POA: Diagnosis not present

## 2020-12-25 DIAGNOSIS — D509 Iron deficiency anemia, unspecified: Secondary | ICD-10-CM | POA: Diagnosis not present

## 2020-12-26 DIAGNOSIS — Z992 Dependence on renal dialysis: Secondary | ICD-10-CM | POA: Diagnosis not present

## 2020-12-26 DIAGNOSIS — D631 Anemia in chronic kidney disease: Secondary | ICD-10-CM | POA: Diagnosis not present

## 2020-12-26 DIAGNOSIS — D509 Iron deficiency anemia, unspecified: Secondary | ICD-10-CM | POA: Diagnosis not present

## 2020-12-26 DIAGNOSIS — N186 End stage renal disease: Secondary | ICD-10-CM | POA: Diagnosis not present

## 2020-12-27 DIAGNOSIS — D509 Iron deficiency anemia, unspecified: Secondary | ICD-10-CM | POA: Diagnosis not present

## 2020-12-27 DIAGNOSIS — D631 Anemia in chronic kidney disease: Secondary | ICD-10-CM | POA: Diagnosis not present

## 2020-12-27 DIAGNOSIS — Z992 Dependence on renal dialysis: Secondary | ICD-10-CM | POA: Diagnosis not present

## 2020-12-27 DIAGNOSIS — N186 End stage renal disease: Secondary | ICD-10-CM | POA: Diagnosis not present

## 2020-12-28 DIAGNOSIS — N186 End stage renal disease: Secondary | ICD-10-CM | POA: Diagnosis not present

## 2020-12-28 DIAGNOSIS — D509 Iron deficiency anemia, unspecified: Secondary | ICD-10-CM | POA: Diagnosis not present

## 2020-12-28 DIAGNOSIS — D631 Anemia in chronic kidney disease: Secondary | ICD-10-CM | POA: Diagnosis not present

## 2020-12-28 DIAGNOSIS — Z992 Dependence on renal dialysis: Secondary | ICD-10-CM | POA: Diagnosis not present

## 2020-12-28 DIAGNOSIS — E1142 Type 2 diabetes mellitus with diabetic polyneuropathy: Secondary | ICD-10-CM | POA: Diagnosis not present

## 2020-12-29 DIAGNOSIS — N186 End stage renal disease: Secondary | ICD-10-CM | POA: Diagnosis not present

## 2020-12-29 DIAGNOSIS — D631 Anemia in chronic kidney disease: Secondary | ICD-10-CM | POA: Diagnosis not present

## 2020-12-29 DIAGNOSIS — D509 Iron deficiency anemia, unspecified: Secondary | ICD-10-CM | POA: Diagnosis not present

## 2020-12-29 DIAGNOSIS — Z992 Dependence on renal dialysis: Secondary | ICD-10-CM | POA: Diagnosis not present

## 2020-12-30 DIAGNOSIS — Z992 Dependence on renal dialysis: Secondary | ICD-10-CM | POA: Diagnosis not present

## 2020-12-30 DIAGNOSIS — N186 End stage renal disease: Secondary | ICD-10-CM | POA: Diagnosis not present

## 2020-12-30 DIAGNOSIS — D509 Iron deficiency anemia, unspecified: Secondary | ICD-10-CM | POA: Diagnosis not present

## 2020-12-30 DIAGNOSIS — D631 Anemia in chronic kidney disease: Secondary | ICD-10-CM | POA: Diagnosis not present

## 2020-12-31 DIAGNOSIS — N186 End stage renal disease: Secondary | ICD-10-CM | POA: Diagnosis not present

## 2020-12-31 DIAGNOSIS — D631 Anemia in chronic kidney disease: Secondary | ICD-10-CM | POA: Diagnosis not present

## 2020-12-31 DIAGNOSIS — Z992 Dependence on renal dialysis: Secondary | ICD-10-CM | POA: Diagnosis not present

## 2020-12-31 DIAGNOSIS — D509 Iron deficiency anemia, unspecified: Secondary | ICD-10-CM | POA: Diagnosis not present

## 2021-01-01 DIAGNOSIS — D631 Anemia in chronic kidney disease: Secondary | ICD-10-CM | POA: Diagnosis not present

## 2021-01-01 DIAGNOSIS — Z992 Dependence on renal dialysis: Secondary | ICD-10-CM | POA: Diagnosis not present

## 2021-01-01 DIAGNOSIS — N186 End stage renal disease: Secondary | ICD-10-CM | POA: Diagnosis not present

## 2021-01-01 DIAGNOSIS — D509 Iron deficiency anemia, unspecified: Secondary | ICD-10-CM | POA: Diagnosis not present

## 2021-01-02 DIAGNOSIS — Z992 Dependence on renal dialysis: Secondary | ICD-10-CM | POA: Diagnosis not present

## 2021-01-02 DIAGNOSIS — D509 Iron deficiency anemia, unspecified: Secondary | ICD-10-CM | POA: Diagnosis not present

## 2021-01-02 DIAGNOSIS — N186 End stage renal disease: Secondary | ICD-10-CM | POA: Diagnosis not present

## 2021-01-02 DIAGNOSIS — D631 Anemia in chronic kidney disease: Secondary | ICD-10-CM | POA: Diagnosis not present

## 2021-01-03 DIAGNOSIS — D631 Anemia in chronic kidney disease: Secondary | ICD-10-CM | POA: Diagnosis not present

## 2021-01-03 DIAGNOSIS — N186 End stage renal disease: Secondary | ICD-10-CM | POA: Diagnosis not present

## 2021-01-03 DIAGNOSIS — D509 Iron deficiency anemia, unspecified: Secondary | ICD-10-CM | POA: Diagnosis not present

## 2021-01-03 DIAGNOSIS — Z992 Dependence on renal dialysis: Secondary | ICD-10-CM | POA: Diagnosis not present

## 2021-01-04 DIAGNOSIS — E1142 Type 2 diabetes mellitus with diabetic polyneuropathy: Secondary | ICD-10-CM | POA: Diagnosis not present

## 2021-01-04 DIAGNOSIS — D631 Anemia in chronic kidney disease: Secondary | ICD-10-CM | POA: Diagnosis not present

## 2021-01-04 DIAGNOSIS — N186 End stage renal disease: Secondary | ICD-10-CM | POA: Diagnosis not present

## 2021-01-04 DIAGNOSIS — I10 Essential (primary) hypertension: Secondary | ICD-10-CM | POA: Diagnosis not present

## 2021-01-04 DIAGNOSIS — D509 Iron deficiency anemia, unspecified: Secondary | ICD-10-CM | POA: Diagnosis not present

## 2021-01-04 DIAGNOSIS — E1122 Type 2 diabetes mellitus with diabetic chronic kidney disease: Secondary | ICD-10-CM | POA: Diagnosis not present

## 2021-01-04 DIAGNOSIS — Z794 Long term (current) use of insulin: Secondary | ICD-10-CM | POA: Diagnosis not present

## 2021-01-04 DIAGNOSIS — E1159 Type 2 diabetes mellitus with other circulatory complications: Secondary | ICD-10-CM | POA: Diagnosis not present

## 2021-01-04 DIAGNOSIS — Z992 Dependence on renal dialysis: Secondary | ICD-10-CM | POA: Diagnosis not present

## 2021-01-05 DIAGNOSIS — N186 End stage renal disease: Secondary | ICD-10-CM | POA: Diagnosis not present

## 2021-01-05 DIAGNOSIS — D509 Iron deficiency anemia, unspecified: Secondary | ICD-10-CM | POA: Diagnosis not present

## 2021-01-05 DIAGNOSIS — Z992 Dependence on renal dialysis: Secondary | ICD-10-CM | POA: Diagnosis not present

## 2021-01-05 DIAGNOSIS — D631 Anemia in chronic kidney disease: Secondary | ICD-10-CM | POA: Diagnosis not present

## 2021-01-06 DIAGNOSIS — D509 Iron deficiency anemia, unspecified: Secondary | ICD-10-CM | POA: Diagnosis not present

## 2021-01-06 DIAGNOSIS — N186 End stage renal disease: Secondary | ICD-10-CM | POA: Diagnosis not present

## 2021-01-06 DIAGNOSIS — Z992 Dependence on renal dialysis: Secondary | ICD-10-CM | POA: Diagnosis not present

## 2021-01-06 DIAGNOSIS — D631 Anemia in chronic kidney disease: Secondary | ICD-10-CM | POA: Diagnosis not present

## 2021-01-07 DIAGNOSIS — D509 Iron deficiency anemia, unspecified: Secondary | ICD-10-CM | POA: Diagnosis not present

## 2021-01-07 DIAGNOSIS — Z992 Dependence on renal dialysis: Secondary | ICD-10-CM | POA: Diagnosis not present

## 2021-01-07 DIAGNOSIS — D631 Anemia in chronic kidney disease: Secondary | ICD-10-CM | POA: Diagnosis not present

## 2021-01-07 DIAGNOSIS — N186 End stage renal disease: Secondary | ICD-10-CM | POA: Diagnosis not present

## 2021-01-08 DIAGNOSIS — D631 Anemia in chronic kidney disease: Secondary | ICD-10-CM | POA: Diagnosis not present

## 2021-01-08 DIAGNOSIS — Z992 Dependence on renal dialysis: Secondary | ICD-10-CM | POA: Diagnosis not present

## 2021-01-08 DIAGNOSIS — D509 Iron deficiency anemia, unspecified: Secondary | ICD-10-CM | POA: Diagnosis not present

## 2021-01-08 DIAGNOSIS — N186 End stage renal disease: Secondary | ICD-10-CM | POA: Diagnosis not present

## 2021-01-09 DIAGNOSIS — D631 Anemia in chronic kidney disease: Secondary | ICD-10-CM | POA: Diagnosis not present

## 2021-01-09 DIAGNOSIS — Z992 Dependence on renal dialysis: Secondary | ICD-10-CM | POA: Diagnosis not present

## 2021-01-09 DIAGNOSIS — N186 End stage renal disease: Secondary | ICD-10-CM | POA: Diagnosis not present

## 2021-01-09 DIAGNOSIS — D509 Iron deficiency anemia, unspecified: Secondary | ICD-10-CM | POA: Diagnosis not present

## 2021-01-10 DIAGNOSIS — N186 End stage renal disease: Secondary | ICD-10-CM | POA: Diagnosis not present

## 2021-01-10 DIAGNOSIS — D509 Iron deficiency anemia, unspecified: Secondary | ICD-10-CM | POA: Diagnosis not present

## 2021-01-10 DIAGNOSIS — Z992 Dependence on renal dialysis: Secondary | ICD-10-CM | POA: Diagnosis not present

## 2021-01-10 DIAGNOSIS — D631 Anemia in chronic kidney disease: Secondary | ICD-10-CM | POA: Diagnosis not present

## 2021-01-11 DIAGNOSIS — D631 Anemia in chronic kidney disease: Secondary | ICD-10-CM | POA: Diagnosis not present

## 2021-01-11 DIAGNOSIS — N186 End stage renal disease: Secondary | ICD-10-CM | POA: Diagnosis not present

## 2021-01-11 DIAGNOSIS — D509 Iron deficiency anemia, unspecified: Secondary | ICD-10-CM | POA: Diagnosis not present

## 2021-01-11 DIAGNOSIS — Z992 Dependence on renal dialysis: Secondary | ICD-10-CM | POA: Diagnosis not present

## 2021-01-12 DIAGNOSIS — D631 Anemia in chronic kidney disease: Secondary | ICD-10-CM | POA: Diagnosis not present

## 2021-01-12 DIAGNOSIS — Z992 Dependence on renal dialysis: Secondary | ICD-10-CM | POA: Diagnosis not present

## 2021-01-12 DIAGNOSIS — D509 Iron deficiency anemia, unspecified: Secondary | ICD-10-CM | POA: Diagnosis not present

## 2021-01-12 DIAGNOSIS — N186 End stage renal disease: Secondary | ICD-10-CM | POA: Diagnosis not present

## 2021-01-13 DIAGNOSIS — N186 End stage renal disease: Secondary | ICD-10-CM | POA: Diagnosis not present

## 2021-01-13 DIAGNOSIS — Z992 Dependence on renal dialysis: Secondary | ICD-10-CM | POA: Diagnosis not present

## 2021-01-13 DIAGNOSIS — D509 Iron deficiency anemia, unspecified: Secondary | ICD-10-CM | POA: Diagnosis not present

## 2021-01-13 DIAGNOSIS — D631 Anemia in chronic kidney disease: Secondary | ICD-10-CM | POA: Diagnosis not present

## 2021-01-14 DIAGNOSIS — D631 Anemia in chronic kidney disease: Secondary | ICD-10-CM | POA: Diagnosis not present

## 2021-01-14 DIAGNOSIS — D509 Iron deficiency anemia, unspecified: Secondary | ICD-10-CM | POA: Diagnosis not present

## 2021-01-14 DIAGNOSIS — Z992 Dependence on renal dialysis: Secondary | ICD-10-CM | POA: Diagnosis not present

## 2021-01-14 DIAGNOSIS — N186 End stage renal disease: Secondary | ICD-10-CM | POA: Diagnosis not present

## 2021-01-15 DIAGNOSIS — Z992 Dependence on renal dialysis: Secondary | ICD-10-CM | POA: Diagnosis not present

## 2021-01-15 DIAGNOSIS — D509 Iron deficiency anemia, unspecified: Secondary | ICD-10-CM | POA: Diagnosis not present

## 2021-01-15 DIAGNOSIS — D631 Anemia in chronic kidney disease: Secondary | ICD-10-CM | POA: Diagnosis not present

## 2021-01-15 DIAGNOSIS — N186 End stage renal disease: Secondary | ICD-10-CM | POA: Diagnosis not present

## 2021-01-16 DIAGNOSIS — D631 Anemia in chronic kidney disease: Secondary | ICD-10-CM | POA: Diagnosis not present

## 2021-01-16 DIAGNOSIS — Z992 Dependence on renal dialysis: Secondary | ICD-10-CM | POA: Diagnosis not present

## 2021-01-16 DIAGNOSIS — N186 End stage renal disease: Secondary | ICD-10-CM | POA: Diagnosis not present

## 2021-01-16 DIAGNOSIS — D509 Iron deficiency anemia, unspecified: Secondary | ICD-10-CM | POA: Diagnosis not present

## 2021-01-17 DIAGNOSIS — D631 Anemia in chronic kidney disease: Secondary | ICD-10-CM | POA: Diagnosis not present

## 2021-01-17 DIAGNOSIS — N186 End stage renal disease: Secondary | ICD-10-CM | POA: Diagnosis not present

## 2021-01-17 DIAGNOSIS — Z01812 Encounter for preprocedural laboratory examination: Secondary | ICD-10-CM | POA: Diagnosis not present

## 2021-01-17 DIAGNOSIS — Z992 Dependence on renal dialysis: Secondary | ICD-10-CM | POA: Diagnosis not present

## 2021-01-17 DIAGNOSIS — D509 Iron deficiency anemia, unspecified: Secondary | ICD-10-CM | POA: Diagnosis not present

## 2021-01-17 DIAGNOSIS — Z7689 Persons encountering health services in other specified circumstances: Secondary | ICD-10-CM | POA: Diagnosis not present

## 2021-01-17 DIAGNOSIS — I10 Essential (primary) hypertension: Secondary | ICD-10-CM | POA: Diagnosis not present

## 2021-01-18 DIAGNOSIS — N186 End stage renal disease: Secondary | ICD-10-CM | POA: Diagnosis not present

## 2021-01-18 DIAGNOSIS — D631 Anemia in chronic kidney disease: Secondary | ICD-10-CM | POA: Diagnosis not present

## 2021-01-18 DIAGNOSIS — D509 Iron deficiency anemia, unspecified: Secondary | ICD-10-CM | POA: Diagnosis not present

## 2021-01-18 DIAGNOSIS — Z992 Dependence on renal dialysis: Secondary | ICD-10-CM | POA: Diagnosis not present

## 2021-01-19 DIAGNOSIS — D509 Iron deficiency anemia, unspecified: Secondary | ICD-10-CM | POA: Diagnosis not present

## 2021-01-19 DIAGNOSIS — D631 Anemia in chronic kidney disease: Secondary | ICD-10-CM | POA: Diagnosis not present

## 2021-01-19 DIAGNOSIS — Z992 Dependence on renal dialysis: Secondary | ICD-10-CM | POA: Diagnosis not present

## 2021-01-19 DIAGNOSIS — N186 End stage renal disease: Secondary | ICD-10-CM | POA: Diagnosis not present

## 2021-01-20 DIAGNOSIS — N186 End stage renal disease: Secondary | ICD-10-CM | POA: Diagnosis not present

## 2021-01-20 DIAGNOSIS — D509 Iron deficiency anemia, unspecified: Secondary | ICD-10-CM | POA: Diagnosis not present

## 2021-01-20 DIAGNOSIS — Z992 Dependence on renal dialysis: Secondary | ICD-10-CM | POA: Diagnosis not present

## 2021-01-20 DIAGNOSIS — D631 Anemia in chronic kidney disease: Secondary | ICD-10-CM | POA: Diagnosis not present

## 2021-01-21 DIAGNOSIS — Z992 Dependence on renal dialysis: Secondary | ICD-10-CM | POA: Diagnosis not present

## 2021-01-21 DIAGNOSIS — D631 Anemia in chronic kidney disease: Secondary | ICD-10-CM | POA: Diagnosis not present

## 2021-01-21 DIAGNOSIS — D509 Iron deficiency anemia, unspecified: Secondary | ICD-10-CM | POA: Diagnosis not present

## 2021-01-21 DIAGNOSIS — N186 End stage renal disease: Secondary | ICD-10-CM | POA: Diagnosis not present

## 2021-01-22 DIAGNOSIS — D631 Anemia in chronic kidney disease: Secondary | ICD-10-CM | POA: Diagnosis not present

## 2021-01-22 DIAGNOSIS — E559 Vitamin D deficiency, unspecified: Secondary | ICD-10-CM | POA: Diagnosis not present

## 2021-01-22 DIAGNOSIS — Z992 Dependence on renal dialysis: Secondary | ICD-10-CM | POA: Diagnosis not present

## 2021-01-22 DIAGNOSIS — D509 Iron deficiency anemia, unspecified: Secondary | ICD-10-CM | POA: Diagnosis not present

## 2021-01-22 DIAGNOSIS — N186 End stage renal disease: Secondary | ICD-10-CM | POA: Diagnosis not present

## 2021-01-23 DIAGNOSIS — D509 Iron deficiency anemia, unspecified: Secondary | ICD-10-CM | POA: Diagnosis not present

## 2021-01-23 DIAGNOSIS — N186 End stage renal disease: Secondary | ICD-10-CM | POA: Diagnosis not present

## 2021-01-23 DIAGNOSIS — D631 Anemia in chronic kidney disease: Secondary | ICD-10-CM | POA: Diagnosis not present

## 2021-01-23 DIAGNOSIS — Z992 Dependence on renal dialysis: Secondary | ICD-10-CM | POA: Diagnosis not present

## 2021-01-23 DIAGNOSIS — E559 Vitamin D deficiency, unspecified: Secondary | ICD-10-CM | POA: Diagnosis not present

## 2021-01-24 ENCOUNTER — Ambulatory Visit
Admission: RE | Admit: 2021-01-24 | Discharge: 2021-01-24 | Disposition: A | Discharge status: Home / Self Care | Payer: Medicare Other | Attending: Cardiology | Admitting: Cardiology

## 2021-01-24 ENCOUNTER — Encounter: Payer: Self-pay | Admitting: Cardiology

## 2021-01-24 ENCOUNTER — Other Ambulatory Visit: Payer: Self-pay

## 2021-01-24 ENCOUNTER — Encounter
Admission: RE | Disposition: A | Discharge status: Home / Self Care | Payer: Self-pay | Source: Home / Self Care | Attending: Cardiology

## 2021-01-24 DIAGNOSIS — E559 Vitamin D deficiency, unspecified: Secondary | ICD-10-CM | POA: Diagnosis not present

## 2021-01-24 DIAGNOSIS — I120 Hypertensive chronic kidney disease with stage 5 chronic kidney disease or end stage renal disease: Secondary | ICD-10-CM | POA: Diagnosis not present

## 2021-01-24 DIAGNOSIS — D631 Anemia in chronic kidney disease: Secondary | ICD-10-CM | POA: Diagnosis not present

## 2021-01-24 DIAGNOSIS — Z87891 Personal history of nicotine dependence: Secondary | ICD-10-CM | POA: Diagnosis not present

## 2021-01-24 DIAGNOSIS — E1122 Type 2 diabetes mellitus with diabetic chronic kidney disease: Secondary | ICD-10-CM | POA: Insufficient documentation

## 2021-01-24 DIAGNOSIS — Z951 Presence of aortocoronary bypass graft: Secondary | ICD-10-CM | POA: Diagnosis not present

## 2021-01-24 DIAGNOSIS — Z955 Presence of coronary angioplasty implant and graft: Secondary | ICD-10-CM | POA: Insufficient documentation

## 2021-01-24 DIAGNOSIS — Z79899 Other long term (current) drug therapy: Secondary | ICD-10-CM | POA: Insufficient documentation

## 2021-01-24 DIAGNOSIS — I2582 Chronic total occlusion of coronary artery: Secondary | ICD-10-CM | POA: Insufficient documentation

## 2021-01-24 DIAGNOSIS — I208 Other forms of angina pectoris: Secondary | ICD-10-CM

## 2021-01-24 DIAGNOSIS — Z992 Dependence on renal dialysis: Secondary | ICD-10-CM | POA: Insufficient documentation

## 2021-01-24 DIAGNOSIS — N186 End stage renal disease: Secondary | ICD-10-CM | POA: Diagnosis not present

## 2021-01-24 DIAGNOSIS — I350 Nonrheumatic aortic (valve) stenosis: Secondary | ICD-10-CM | POA: Insufficient documentation

## 2021-01-24 DIAGNOSIS — I25118 Atherosclerotic heart disease of native coronary artery with other forms of angina pectoris: Secondary | ICD-10-CM | POA: Diagnosis not present

## 2021-01-24 DIAGNOSIS — Z7982 Long term (current) use of aspirin: Secondary | ICD-10-CM | POA: Insufficient documentation

## 2021-01-24 DIAGNOSIS — Z7682 Awaiting organ transplant status: Secondary | ICD-10-CM | POA: Diagnosis not present

## 2021-01-24 DIAGNOSIS — I209 Angina pectoris, unspecified: Secondary | ICD-10-CM | POA: Diagnosis not present

## 2021-01-24 DIAGNOSIS — I2089 Other forms of angina pectoris: Secondary | ICD-10-CM

## 2021-01-24 DIAGNOSIS — Z794 Long term (current) use of insulin: Secondary | ICD-10-CM | POA: Insufficient documentation

## 2021-01-24 DIAGNOSIS — D509 Iron deficiency anemia, unspecified: Secondary | ICD-10-CM | POA: Diagnosis not present

## 2021-01-24 HISTORY — PX: LEFT HEART CATH AND CORS/GRAFTS ANGIOGRAPHY: CATH118250

## 2021-01-24 LAB — GLUCOSE, CAPILLARY
Glucose-Capillary: 86 mg/dL (ref 70–99)
Glucose-Capillary: 92 mg/dL (ref 70–99)

## 2021-01-24 SURGERY — LEFT HEART CATH AND CORS/GRAFTS ANGIOGRAPHY
Anesthesia: Moderate Sedation

## 2021-01-24 MED ORDER — MIDAZOLAM HCL 2 MG/2ML IJ SOLN
INTRAMUSCULAR | Status: DC | PRN
  Administered 2021-01-24: 1 mg via INTRAVENOUS

## 2021-01-24 MED ORDER — HEPARIN SODIUM (PORCINE) 1000 UNIT/ML IJ SOLN
INTRAMUSCULAR | Status: AC
  Filled 2021-01-24: qty 1

## 2021-01-24 MED ORDER — SODIUM CHLORIDE 0.9 % IV SOLN
INTRAVENOUS | Status: DC
  Administered 2021-01-24: 10 mL via INTRAVENOUS

## 2021-01-24 MED ORDER — VERAPAMIL HCL 2.5 MG/ML IV SOLN
INTRAVENOUS | Status: DC | PRN
  Administered 2021-01-24: 2.5 mg via INTRA_ARTERIAL

## 2021-01-24 MED ORDER — ACETAMINOPHEN 325 MG PO TABS: 650.0000 mg | ORAL_TABLET | ORAL | Status: DC | PRN

## 2021-01-24 MED ORDER — ASPIRIN 81 MG PO CHEW
CHEWABLE_TABLET | ORAL | Status: AC
  Administered 2021-01-24: 243 mg via ORAL
  Filled 2021-01-24: qty 3

## 2021-01-24 MED ORDER — MIDAZOLAM HCL 2 MG/2ML IJ SOLN
INTRAMUSCULAR | Status: AC
  Filled 2021-01-24: qty 2

## 2021-01-24 MED ORDER — DEXTROSE 50 % IV SOLN
12.5000 g | Freq: Once | INTRAVENOUS | Status: AC
  Administered 2021-01-24: 12.5 g via INTRAVENOUS

## 2021-01-24 MED ORDER — FENTANYL CITRATE (PF) 100 MCG/2ML IJ SOLN
INTRAMUSCULAR | Status: AC
  Filled 2021-01-24: qty 2

## 2021-01-24 MED ORDER — SODIUM CHLORIDE 0.9 % IV SOLN: 250.0000 mL | INTRAVENOUS | Status: DC | PRN

## 2021-01-24 MED ORDER — LIDOCAINE HCL (PF) 1 % IJ SOLN
INTRAMUSCULAR | Status: DC | PRN
  Administered 2021-01-24: 2 mL

## 2021-01-24 MED ORDER — VERAPAMIL HCL 2.5 MG/ML IV SOLN
INTRAVENOUS | Status: AC
  Filled 2021-01-24: qty 2

## 2021-01-24 MED ORDER — IOHEXOL 350 MG/ML SOLN
INTRAVENOUS | Status: DC | PRN
  Administered 2021-01-24: 60 mL

## 2021-01-24 MED ORDER — LIDOCAINE HCL 1 % IJ SOLN
INTRAMUSCULAR | Status: AC
  Filled 2021-01-24: qty 20

## 2021-01-24 MED ORDER — FENTANYL CITRATE (PF) 100 MCG/2ML IJ SOLN
INTRAMUSCULAR | Status: DC | PRN
  Administered 2021-01-24: 25 ug via INTRAVENOUS

## 2021-01-24 MED ORDER — HEPARIN (PORCINE) IN NACL 1000-0.9 UT/500ML-% IV SOLN
INTRAVENOUS | Status: DC | PRN
  Administered 2021-01-24: 1000 mL

## 2021-01-24 MED ORDER — HEPARIN SODIUM (PORCINE) 1000 UNIT/ML IJ SOLN
INTRAMUSCULAR | Status: DC | PRN
  Administered 2021-01-24: 4000 [IU] via INTRAVENOUS

## 2021-01-24 MED ORDER — SODIUM CHLORIDE 0.9% FLUSH: 3.0000 mL | Freq: Two times a day (BID) | INTRAVENOUS | Status: DC

## 2021-01-24 MED ORDER — ONDANSETRON HCL 4 MG/2ML IJ SOLN: 4.0000 mg | Freq: Four times a day (QID) | INTRAMUSCULAR | Status: DC | PRN

## 2021-01-24 MED ORDER — SODIUM CHLORIDE 0.9% FLUSH: 3.0000 mL | INTRAVENOUS | Status: DC | PRN

## 2021-01-24 MED ORDER — DEXTROSE 50 % IV SOLN
INTRAVENOUS | Status: AC
  Filled 2021-01-24: qty 50

## 2021-01-24 MED ORDER — ASPIRIN 81 MG PO CHEW: 324.0000 mg | CHEWABLE_TABLET | ORAL | Status: AC

## 2021-01-24 MED ORDER — HEPARIN (PORCINE) IN NACL 1000-0.9 UT/500ML-% IV SOLN
INTRAVENOUS | Status: AC
  Filled 2021-01-24: qty 1000

## 2021-01-24 SURGICAL SUPPLY — 13 items
CATH INFINITI 5 FR IM (CATHETERS) ×2 IMPLANT
CATH INFINITI 5 FR JL3.5 (CATHETERS) ×2 IMPLANT
CATH INFINITI 5FR ANG PIGTAIL (CATHETERS) ×2 IMPLANT
DEVICE RAD TR BAND REGULAR (VASCULAR PRODUCTS) ×2 IMPLANT
DRAPE BRACHIAL (DRAPES) ×2 IMPLANT
GLIDESHEATH SLEND SS 6F .021 (SHEATH) ×2 IMPLANT
GUIDEWIRE INQWIRE 1.5J.035X260 (WIRE) ×1 IMPLANT
INQWIRE 1.5J .035X260CM (WIRE) ×2
PACK CARDIAC CATH (CUSTOM PROCEDURE TRAY) ×2 IMPLANT
PROTECTION STATION PRESSURIZED (MISCELLANEOUS) ×2
SET ATX SIMPLICITY (MISCELLANEOUS) ×2 IMPLANT
STATION PROTECTION PRESSURIZED (MISCELLANEOUS) ×1 IMPLANT
WIRE HITORQ VERSACORE ST 145CM (WIRE) ×2 IMPLANT

## 2021-01-24 NOTE — H&P (Signed)
[facility] Cardiology History and Physical  Patient ID: Roger Knight MRN: [id number] DOB/AGE: [birth year] 70 y.o. Admit date: 01/24/2021  Primary Care Physician: Leonel Ramsay, MD Primary Cardiologist Jordan Hawks  HPI:  History of Present Illness: Roger Knight is a 70 y.o.male patient who presents for a follow-up visit. He has a history of chronic kidney disease currently on peritoneal dialysis, CAD status post four-vessel CABG 1996 (LIMA to LAD, SVG to PDA, SVG to OM1), and bifurcation LMCA PCI 2010, type 2 diabetes who presents to clinic today to discuss heart catheterization which was recommended by his nephrologist.  He is trying to be listed for transplant. He was told that he will require a heart catheterization prior to being listed for transplant. He is not particularly active anymore, but he does not have any chest pain or shortness of breath when he exerts himself. He is able to ambulate up a flight of stairs without chest pain fairly easily. His volume status is well managed with dialysis, and he has no acute complaints.    Past Medical History:  Diagnosis Date   Accelerating angina ([place]) 02/11/2014   Anemia    Arthritis    Carcinoma ([place]) 2012    skin cancer on neck [facility]   Chronic kidney disease 2016   Diabetes mellitus without complication (HCC)    GERD (gastroesophageal reflux disease)    Hemorrhoids 2016-17   Hernia    Hyperlipidemia    Hypertension    Skin cancer     Past Surgical History:  Procedure Laterality Date   COLONOSCOPY  April 2014   COLONOSCOPY N/A 05/13/2015   Procedure: COLONOSCOPY;  Surgeon: Josefine Class, MD;  Location: [facility] ENDOSCOPY;  Service: Endoscopy;  Laterality: N/A;   CORONARY ARTERY BYPASS GRAFT  1996   [facility]   CORONARY STENT PLACEMENT  2011   [facility]   ESOPHAGOGASTRODUODENOSCOPY (EGD) WITH PROPOFOL N/A 05/13/2015   Procedure: ESOPHAGOGASTRODUODENOSCOPY (EGD) WITH PROPOFOL;  Surgeon: Josefine Class, MD;  Location: [facility] ENDOSCOPY;   Service: Endoscopy;  Laterality: N/A;   UPPER GI ENDOSCOPY  2014    Medications Prior to Admission  Medication Sig Dispense Refill Last Dose   amLODipine (NORVASC) 5 MG tablet Take 5 mg by mouth daily.    01/24/2021   aspirin 81 MG tablet Take 81 mg by mouth daily.   01/24/2021   atorvastatin (LIPITOR) 80 MG tablet Take 80 mg by mouth daily.   01/24/2021   calcitRIOL (ROCALTROL) 0.25 MCG capsule Take 0.25 mcg by mouth daily.    01/23/2021   Calcium Polycarbophil (FIBER) 625 MG TABS Take 2 capsules by mouth in the morning and at bedtime.   01/24/2021   cetirizine (ZYRTEC) 10 MG tablet Take 10 mg by mouth daily.   01/24/2021   ezetimibe (ZETIA) 10 MG tablet Take 10 mg by mouth daily.   01/24/2021   gabapentin (NEURONTIN) 300 MG capsule Take 300 mg by mouth at bedtime.   01/23/2021   insulin aspart (NOVOLOG) 100 UNIT/ML injection Inject 20 Units into the skin 3 (three) times daily.    01/23/2021   metoprolol (TOPROL-XL) 200 MG 24 hr tablet Take 200 mg by mouth daily.   01/24/2021   Multiple Vitamins-Minerals (MULTIVITAMIN WITH MINERALS) tablet Take 1 tablet by mouth daily.   01/24/2021   pantoprazole (PROTONIX) 40 MG tablet Take 40 mg by mouth 2 (two) times daily before a meal.    01/24/2021   sildenafil (VIAGRA) 100 MG tablet Take 100 mg by mouth as  needed for erectile dysfunction. Reported on 06/14/2015      spironolactone (ALDACTONE) 25 MG tablet Take 25 mg by mouth 2 (two) times daily.   01/24/2021   TRESIBA FLEXTOUCH 200 UNIT/ML SOPN Inject 90 Units into the skin at bedtime.   01/23/2021   triamcinolone cream (KENALOG) 0.1 % Apply 1 application topically 2 (two) times daily as needed (irritation).      valsartan-hydrochlorothiazide (DIOVAN-HCT) 320-25 MG per tablet Take 1 tablet by mouth daily.   01/24/2021   Insulin Pen Needle 32G X 4 MM MISC USE WITH INJECTIONS 4 TIMES DAILY      Social History   Socioeconomic History   Marital status: Married    Spouse name: Not on file   Number of children: Not  on file   Years of education: Not on file   Highest education level: Not on file  Occupational History   Not on file  Tobacco Use   Smoking status: Former    Types: Cigarettes    Quit date: 04/24/2005    Years since quitting: 15.7   Smokeless tobacco: Former    Types: Nurse, children's Use: Never used  Substance and Sexual Activity   Alcohol use: No   Drug use: No   Sexual activity: Yes  Other Topics Concern   Not on file  Social History Narrative   Not on file   Social Determinants of Health   Financial Resource Strain: Not on file  Food Insecurity: Not on file  Transportation Needs: Not on file  Physical Activity: Not on file  Stress: Not on file  Social Connections: Not on file  Intimate Partner Violence: Not on file    Family History  Problem Relation Age of Onset   Dementia Mother 54   Osteoarthritis Mother    Cancer Mother       Review of systems complete and found to be negative unless listed above      Physical Exam:  General: Well developed, well nourished, in no acute distress HEENT:  Normocephalic and atramatic Neck:  No JVD.  Lungs: Clear bilaterally to auscultation and percussion. Heart: HRRR; 3/6 late peaking systolic murmur. Normal S1 and S2 without gallops or murmurs.  Abdomen: Bowel sounds are positive, abdomen soft and non-tender  Msk:  Back normal, normal gait. Normal strength and tone for age. Extremities: No clubbing, cyanosis or edema.   Neuro: Alert and oriented X 3. Psych:  Good affect, responds appropriately   Labs:   Lab Results  Component Value Date   WBC 8.8 05/28/2020   HGB 9.8 (L) 05/28/2020   HCT 28.0 (L) 05/28/2020   MCV 88.9 05/28/2020   PLT 197 05/28/2020   No results for input(s): NA, K, CL, CO2, BUN, CREATININE, CALCIUM, PROT, BILITOT, ALKPHOS, ALT, AST, GLUCOSE in the last 168 hours.  Invalid input(s): LABALBU Lab Results  Component Value Date   TROPONINI <0.03 03/25/2015   No results found for:  CHOL No results found for: HDL No results found for: LDLCALC No results found for: TRIG No results found for: CHOLHDL No results found for: LDLDIRECT    Radiology: No results found.  EKG: Sinus brady with 1st degree AV block. Non-specific IVCD. Inferior infarct.   ASSESSMENT AND PLAN:  70 yo M with ESRD on PD, CAD s/p CABG x 4 and subsequent LMCA PCI 2010 who requires LHC prior to possible listing for renal transplant. He is having no angina and feels well. Last echo with  mild/mod AS, mild/mod LV dysfunction. Will proceed with LRA access. Risks/benefits discussed with patient.   Signed: Andrez Grime MD 01/24/2021, 9:02 AM

## 2021-01-25 DIAGNOSIS — D631 Anemia in chronic kidney disease: Secondary | ICD-10-CM | POA: Diagnosis not present

## 2021-01-25 DIAGNOSIS — N186 End stage renal disease: Secondary | ICD-10-CM | POA: Diagnosis not present

## 2021-01-25 DIAGNOSIS — Z992 Dependence on renal dialysis: Secondary | ICD-10-CM | POA: Diagnosis not present

## 2021-01-25 DIAGNOSIS — D509 Iron deficiency anemia, unspecified: Secondary | ICD-10-CM | POA: Diagnosis not present

## 2021-01-25 DIAGNOSIS — E559 Vitamin D deficiency, unspecified: Secondary | ICD-10-CM | POA: Diagnosis not present

## 2021-01-26 DIAGNOSIS — E559 Vitamin D deficiency, unspecified: Secondary | ICD-10-CM | POA: Diagnosis not present

## 2021-01-26 DIAGNOSIS — Z992 Dependence on renal dialysis: Secondary | ICD-10-CM | POA: Diagnosis not present

## 2021-01-26 DIAGNOSIS — N186 End stage renal disease: Secondary | ICD-10-CM | POA: Diagnosis not present

## 2021-01-26 DIAGNOSIS — D509 Iron deficiency anemia, unspecified: Secondary | ICD-10-CM | POA: Diagnosis not present

## 2021-01-26 DIAGNOSIS — D631 Anemia in chronic kidney disease: Secondary | ICD-10-CM | POA: Diagnosis not present

## 2021-01-27 DIAGNOSIS — Z992 Dependence on renal dialysis: Secondary | ICD-10-CM | POA: Diagnosis not present

## 2021-01-27 DIAGNOSIS — D631 Anemia in chronic kidney disease: Secondary | ICD-10-CM | POA: Diagnosis not present

## 2021-01-27 DIAGNOSIS — N186 End stage renal disease: Secondary | ICD-10-CM | POA: Diagnosis not present

## 2021-01-27 DIAGNOSIS — E559 Vitamin D deficiency, unspecified: Secondary | ICD-10-CM | POA: Diagnosis not present

## 2021-01-27 DIAGNOSIS — D509 Iron deficiency anemia, unspecified: Secondary | ICD-10-CM | POA: Diagnosis not present

## 2021-01-28 DIAGNOSIS — D509 Iron deficiency anemia, unspecified: Secondary | ICD-10-CM | POA: Diagnosis not present

## 2021-01-28 DIAGNOSIS — D631 Anemia in chronic kidney disease: Secondary | ICD-10-CM | POA: Diagnosis not present

## 2021-01-28 DIAGNOSIS — E559 Vitamin D deficiency, unspecified: Secondary | ICD-10-CM | POA: Diagnosis not present

## 2021-01-28 DIAGNOSIS — N186 End stage renal disease: Secondary | ICD-10-CM | POA: Diagnosis not present

## 2021-01-28 DIAGNOSIS — Z992 Dependence on renal dialysis: Secondary | ICD-10-CM | POA: Diagnosis not present

## 2021-01-29 DIAGNOSIS — D509 Iron deficiency anemia, unspecified: Secondary | ICD-10-CM | POA: Diagnosis not present

## 2021-01-29 DIAGNOSIS — N186 End stage renal disease: Secondary | ICD-10-CM | POA: Diagnosis not present

## 2021-01-29 DIAGNOSIS — Z992 Dependence on renal dialysis: Secondary | ICD-10-CM | POA: Diagnosis not present

## 2021-01-29 DIAGNOSIS — E559 Vitamin D deficiency, unspecified: Secondary | ICD-10-CM | POA: Diagnosis not present

## 2021-01-29 DIAGNOSIS — D631 Anemia in chronic kidney disease: Secondary | ICD-10-CM | POA: Diagnosis not present

## 2021-01-30 DIAGNOSIS — E559 Vitamin D deficiency, unspecified: Secondary | ICD-10-CM | POA: Diagnosis not present

## 2021-01-30 DIAGNOSIS — D631 Anemia in chronic kidney disease: Secondary | ICD-10-CM | POA: Diagnosis not present

## 2021-01-30 DIAGNOSIS — D509 Iron deficiency anemia, unspecified: Secondary | ICD-10-CM | POA: Diagnosis not present

## 2021-01-30 DIAGNOSIS — Z992 Dependence on renal dialysis: Secondary | ICD-10-CM | POA: Diagnosis not present

## 2021-01-30 DIAGNOSIS — N186 End stage renal disease: Secondary | ICD-10-CM | POA: Diagnosis not present

## 2021-01-31 DIAGNOSIS — D631 Anemia in chronic kidney disease: Secondary | ICD-10-CM | POA: Diagnosis not present

## 2021-01-31 DIAGNOSIS — E559 Vitamin D deficiency, unspecified: Secondary | ICD-10-CM | POA: Diagnosis not present

## 2021-01-31 DIAGNOSIS — N186 End stage renal disease: Secondary | ICD-10-CM | POA: Diagnosis not present

## 2021-01-31 DIAGNOSIS — D509 Iron deficiency anemia, unspecified: Secondary | ICD-10-CM | POA: Diagnosis not present

## 2021-01-31 DIAGNOSIS — Z992 Dependence on renal dialysis: Secondary | ICD-10-CM | POA: Diagnosis not present

## 2021-02-01 DIAGNOSIS — Z794 Long term (current) use of insulin: Secondary | ICD-10-CM | POA: Diagnosis not present

## 2021-02-01 DIAGNOSIS — D631 Anemia in chronic kidney disease: Secondary | ICD-10-CM | POA: Diagnosis not present

## 2021-02-01 DIAGNOSIS — I10 Essential (primary) hypertension: Secondary | ICD-10-CM | POA: Diagnosis not present

## 2021-02-01 DIAGNOSIS — I2510 Atherosclerotic heart disease of native coronary artery without angina pectoris: Secondary | ICD-10-CM | POA: Diagnosis not present

## 2021-02-01 DIAGNOSIS — Z992 Dependence on renal dialysis: Secondary | ICD-10-CM | POA: Diagnosis not present

## 2021-02-01 DIAGNOSIS — E559 Vitamin D deficiency, unspecified: Secondary | ICD-10-CM | POA: Diagnosis not present

## 2021-02-01 DIAGNOSIS — N186 End stage renal disease: Secondary | ICD-10-CM | POA: Diagnosis not present

## 2021-02-01 DIAGNOSIS — Z01818 Encounter for other preprocedural examination: Secondary | ICD-10-CM | POA: Diagnosis not present

## 2021-02-01 DIAGNOSIS — E119 Type 2 diabetes mellitus without complications: Secondary | ICD-10-CM | POA: Diagnosis not present

## 2021-02-01 DIAGNOSIS — D509 Iron deficiency anemia, unspecified: Secondary | ICD-10-CM | POA: Diagnosis not present

## 2021-02-02 DIAGNOSIS — D631 Anemia in chronic kidney disease: Secondary | ICD-10-CM | POA: Diagnosis not present

## 2021-02-02 DIAGNOSIS — N186 End stage renal disease: Secondary | ICD-10-CM | POA: Diagnosis not present

## 2021-02-02 DIAGNOSIS — E559 Vitamin D deficiency, unspecified: Secondary | ICD-10-CM | POA: Diagnosis not present

## 2021-02-02 DIAGNOSIS — D509 Iron deficiency anemia, unspecified: Secondary | ICD-10-CM | POA: Diagnosis not present

## 2021-02-02 DIAGNOSIS — Z992 Dependence on renal dialysis: Secondary | ICD-10-CM | POA: Diagnosis not present

## 2021-02-03 DIAGNOSIS — N186 End stage renal disease: Secondary | ICD-10-CM | POA: Diagnosis not present

## 2021-02-03 DIAGNOSIS — E559 Vitamin D deficiency, unspecified: Secondary | ICD-10-CM | POA: Diagnosis not present

## 2021-02-03 DIAGNOSIS — D509 Iron deficiency anemia, unspecified: Secondary | ICD-10-CM | POA: Diagnosis not present

## 2021-02-03 DIAGNOSIS — D631 Anemia in chronic kidney disease: Secondary | ICD-10-CM | POA: Diagnosis not present

## 2021-02-03 DIAGNOSIS — Z992 Dependence on renal dialysis: Secondary | ICD-10-CM | POA: Diagnosis not present

## 2021-02-04 DIAGNOSIS — Z992 Dependence on renal dialysis: Secondary | ICD-10-CM | POA: Diagnosis not present

## 2021-02-04 DIAGNOSIS — N186 End stage renal disease: Secondary | ICD-10-CM | POA: Diagnosis not present

## 2021-02-04 DIAGNOSIS — E559 Vitamin D deficiency, unspecified: Secondary | ICD-10-CM | POA: Diagnosis not present

## 2021-02-04 DIAGNOSIS — D509 Iron deficiency anemia, unspecified: Secondary | ICD-10-CM | POA: Diagnosis not present

## 2021-02-04 DIAGNOSIS — D631 Anemia in chronic kidney disease: Secondary | ICD-10-CM | POA: Diagnosis not present

## 2021-02-05 DIAGNOSIS — N186 End stage renal disease: Secondary | ICD-10-CM | POA: Diagnosis not present

## 2021-02-05 DIAGNOSIS — Z992 Dependence on renal dialysis: Secondary | ICD-10-CM | POA: Diagnosis not present

## 2021-02-05 DIAGNOSIS — E559 Vitamin D deficiency, unspecified: Secondary | ICD-10-CM | POA: Diagnosis not present

## 2021-02-05 DIAGNOSIS — D509 Iron deficiency anemia, unspecified: Secondary | ICD-10-CM | POA: Diagnosis not present

## 2021-02-05 DIAGNOSIS — D631 Anemia in chronic kidney disease: Secondary | ICD-10-CM | POA: Diagnosis not present

## 2021-02-06 DIAGNOSIS — Z992 Dependence on renal dialysis: Secondary | ICD-10-CM | POA: Diagnosis not present

## 2021-02-06 DIAGNOSIS — D509 Iron deficiency anemia, unspecified: Secondary | ICD-10-CM | POA: Diagnosis not present

## 2021-02-06 DIAGNOSIS — N186 End stage renal disease: Secondary | ICD-10-CM | POA: Diagnosis not present

## 2021-02-06 DIAGNOSIS — D631 Anemia in chronic kidney disease: Secondary | ICD-10-CM | POA: Diagnosis not present

## 2021-02-06 DIAGNOSIS — E559 Vitamin D deficiency, unspecified: Secondary | ICD-10-CM | POA: Diagnosis not present

## 2021-02-07 DIAGNOSIS — E559 Vitamin D deficiency, unspecified: Secondary | ICD-10-CM | POA: Diagnosis not present

## 2021-02-07 DIAGNOSIS — D631 Anemia in chronic kidney disease: Secondary | ICD-10-CM | POA: Diagnosis not present

## 2021-02-07 DIAGNOSIS — D509 Iron deficiency anemia, unspecified: Secondary | ICD-10-CM | POA: Diagnosis not present

## 2021-02-07 DIAGNOSIS — Z992 Dependence on renal dialysis: Secondary | ICD-10-CM | POA: Diagnosis not present

## 2021-02-07 DIAGNOSIS — N186 End stage renal disease: Secondary | ICD-10-CM | POA: Diagnosis not present

## 2021-02-08 DIAGNOSIS — N186 End stage renal disease: Secondary | ICD-10-CM | POA: Diagnosis not present

## 2021-02-08 DIAGNOSIS — D631 Anemia in chronic kidney disease: Secondary | ICD-10-CM | POA: Diagnosis not present

## 2021-02-08 DIAGNOSIS — Z992 Dependence on renal dialysis: Secondary | ICD-10-CM | POA: Diagnosis not present

## 2021-02-08 DIAGNOSIS — D509 Iron deficiency anemia, unspecified: Secondary | ICD-10-CM | POA: Diagnosis not present

## 2021-02-08 DIAGNOSIS — E559 Vitamin D deficiency, unspecified: Secondary | ICD-10-CM | POA: Diagnosis not present

## 2021-02-09 DIAGNOSIS — N186 End stage renal disease: Secondary | ICD-10-CM | POA: Diagnosis not present

## 2021-02-09 DIAGNOSIS — D509 Iron deficiency anemia, unspecified: Secondary | ICD-10-CM | POA: Diagnosis not present

## 2021-02-09 DIAGNOSIS — Z992 Dependence on renal dialysis: Secondary | ICD-10-CM | POA: Diagnosis not present

## 2021-02-09 DIAGNOSIS — D631 Anemia in chronic kidney disease: Secondary | ICD-10-CM | POA: Diagnosis not present

## 2021-02-09 DIAGNOSIS — E559 Vitamin D deficiency, unspecified: Secondary | ICD-10-CM | POA: Diagnosis not present

## 2021-02-10 DIAGNOSIS — N186 End stage renal disease: Secondary | ICD-10-CM | POA: Diagnosis not present

## 2021-02-10 DIAGNOSIS — D631 Anemia in chronic kidney disease: Secondary | ICD-10-CM | POA: Diagnosis not present

## 2021-02-10 DIAGNOSIS — E559 Vitamin D deficiency, unspecified: Secondary | ICD-10-CM | POA: Diagnosis not present

## 2021-02-10 DIAGNOSIS — D509 Iron deficiency anemia, unspecified: Secondary | ICD-10-CM | POA: Diagnosis not present

## 2021-02-10 DIAGNOSIS — Z992 Dependence on renal dialysis: Secondary | ICD-10-CM | POA: Diagnosis not present

## 2021-02-11 DIAGNOSIS — D509 Iron deficiency anemia, unspecified: Secondary | ICD-10-CM | POA: Diagnosis not present

## 2021-02-11 DIAGNOSIS — Z992 Dependence on renal dialysis: Secondary | ICD-10-CM | POA: Diagnosis not present

## 2021-02-11 DIAGNOSIS — N186 End stage renal disease: Secondary | ICD-10-CM | POA: Diagnosis not present

## 2021-02-11 DIAGNOSIS — E559 Vitamin D deficiency, unspecified: Secondary | ICD-10-CM | POA: Diagnosis not present

## 2021-02-11 DIAGNOSIS — D631 Anemia in chronic kidney disease: Secondary | ICD-10-CM | POA: Diagnosis not present

## 2021-02-12 DIAGNOSIS — E559 Vitamin D deficiency, unspecified: Secondary | ICD-10-CM | POA: Diagnosis not present

## 2021-02-12 DIAGNOSIS — D509 Iron deficiency anemia, unspecified: Secondary | ICD-10-CM | POA: Diagnosis not present

## 2021-02-12 DIAGNOSIS — D631 Anemia in chronic kidney disease: Secondary | ICD-10-CM | POA: Diagnosis not present

## 2021-02-12 DIAGNOSIS — Z992 Dependence on renal dialysis: Secondary | ICD-10-CM | POA: Diagnosis not present

## 2021-02-12 DIAGNOSIS — N186 End stage renal disease: Secondary | ICD-10-CM | POA: Diagnosis not present

## 2021-02-13 DIAGNOSIS — Z992 Dependence on renal dialysis: Secondary | ICD-10-CM | POA: Diagnosis not present

## 2021-02-13 DIAGNOSIS — D631 Anemia in chronic kidney disease: Secondary | ICD-10-CM | POA: Diagnosis not present

## 2021-02-13 DIAGNOSIS — N186 End stage renal disease: Secondary | ICD-10-CM | POA: Diagnosis not present

## 2021-02-13 DIAGNOSIS — D509 Iron deficiency anemia, unspecified: Secondary | ICD-10-CM | POA: Diagnosis not present

## 2021-02-13 DIAGNOSIS — E559 Vitamin D deficiency, unspecified: Secondary | ICD-10-CM | POA: Diagnosis not present

## 2021-02-14 DIAGNOSIS — N186 End stage renal disease: Secondary | ICD-10-CM | POA: Diagnosis not present

## 2021-02-14 DIAGNOSIS — E559 Vitamin D deficiency, unspecified: Secondary | ICD-10-CM | POA: Diagnosis not present

## 2021-02-14 DIAGNOSIS — D631 Anemia in chronic kidney disease: Secondary | ICD-10-CM | POA: Diagnosis not present

## 2021-02-14 DIAGNOSIS — D509 Iron deficiency anemia, unspecified: Secondary | ICD-10-CM | POA: Diagnosis not present

## 2021-02-14 DIAGNOSIS — Z992 Dependence on renal dialysis: Secondary | ICD-10-CM | POA: Diagnosis not present

## 2021-02-15 DIAGNOSIS — D509 Iron deficiency anemia, unspecified: Secondary | ICD-10-CM | POA: Diagnosis not present

## 2021-02-15 DIAGNOSIS — N186 End stage renal disease: Secondary | ICD-10-CM | POA: Diagnosis not present

## 2021-02-15 DIAGNOSIS — D631 Anemia in chronic kidney disease: Secondary | ICD-10-CM | POA: Diagnosis not present

## 2021-02-15 DIAGNOSIS — Z992 Dependence on renal dialysis: Secondary | ICD-10-CM | POA: Diagnosis not present

## 2021-02-15 DIAGNOSIS — E559 Vitamin D deficiency, unspecified: Secondary | ICD-10-CM | POA: Diagnosis not present

## 2021-02-16 DIAGNOSIS — D509 Iron deficiency anemia, unspecified: Secondary | ICD-10-CM | POA: Diagnosis not present

## 2021-02-16 DIAGNOSIS — N186 End stage renal disease: Secondary | ICD-10-CM | POA: Diagnosis not present

## 2021-02-16 DIAGNOSIS — Z992 Dependence on renal dialysis: Secondary | ICD-10-CM | POA: Diagnosis not present

## 2021-02-16 DIAGNOSIS — D631 Anemia in chronic kidney disease: Secondary | ICD-10-CM | POA: Diagnosis not present

## 2021-02-16 DIAGNOSIS — E559 Vitamin D deficiency, unspecified: Secondary | ICD-10-CM | POA: Diagnosis not present

## 2021-02-17 DIAGNOSIS — Z992 Dependence on renal dialysis: Secondary | ICD-10-CM | POA: Diagnosis not present

## 2021-02-17 DIAGNOSIS — E559 Vitamin D deficiency, unspecified: Secondary | ICD-10-CM | POA: Diagnosis not present

## 2021-02-17 DIAGNOSIS — N186 End stage renal disease: Secondary | ICD-10-CM | POA: Diagnosis not present

## 2021-02-17 DIAGNOSIS — D509 Iron deficiency anemia, unspecified: Secondary | ICD-10-CM | POA: Diagnosis not present

## 2021-02-17 DIAGNOSIS — D631 Anemia in chronic kidney disease: Secondary | ICD-10-CM | POA: Diagnosis not present

## 2021-02-18 DIAGNOSIS — Z992 Dependence on renal dialysis: Secondary | ICD-10-CM | POA: Diagnosis not present

## 2021-02-18 DIAGNOSIS — D509 Iron deficiency anemia, unspecified: Secondary | ICD-10-CM | POA: Diagnosis not present

## 2021-02-18 DIAGNOSIS — D631 Anemia in chronic kidney disease: Secondary | ICD-10-CM | POA: Diagnosis not present

## 2021-02-18 DIAGNOSIS — E559 Vitamin D deficiency, unspecified: Secondary | ICD-10-CM | POA: Diagnosis not present

## 2021-02-18 DIAGNOSIS — N186 End stage renal disease: Secondary | ICD-10-CM | POA: Diagnosis not present

## 2021-02-19 DIAGNOSIS — D509 Iron deficiency anemia, unspecified: Secondary | ICD-10-CM | POA: Diagnosis not present

## 2021-02-19 DIAGNOSIS — N186 End stage renal disease: Secondary | ICD-10-CM | POA: Diagnosis not present

## 2021-02-19 DIAGNOSIS — D631 Anemia in chronic kidney disease: Secondary | ICD-10-CM | POA: Diagnosis not present

## 2021-02-19 DIAGNOSIS — E559 Vitamin D deficiency, unspecified: Secondary | ICD-10-CM | POA: Diagnosis not present

## 2021-02-19 DIAGNOSIS — Z992 Dependence on renal dialysis: Secondary | ICD-10-CM | POA: Diagnosis not present

## 2021-02-20 DIAGNOSIS — D509 Iron deficiency anemia, unspecified: Secondary | ICD-10-CM | POA: Diagnosis not present

## 2021-02-20 DIAGNOSIS — Z992 Dependence on renal dialysis: Secondary | ICD-10-CM | POA: Diagnosis not present

## 2021-02-20 DIAGNOSIS — E559 Vitamin D deficiency, unspecified: Secondary | ICD-10-CM | POA: Diagnosis not present

## 2021-02-20 DIAGNOSIS — N186 End stage renal disease: Secondary | ICD-10-CM | POA: Diagnosis not present

## 2021-02-20 DIAGNOSIS — D631 Anemia in chronic kidney disease: Secondary | ICD-10-CM | POA: Diagnosis not present

## 2021-02-21 DIAGNOSIS — E559 Vitamin D deficiency, unspecified: Secondary | ICD-10-CM | POA: Diagnosis not present

## 2021-02-21 DIAGNOSIS — Z992 Dependence on renal dialysis: Secondary | ICD-10-CM | POA: Diagnosis not present

## 2021-02-21 DIAGNOSIS — D509 Iron deficiency anemia, unspecified: Secondary | ICD-10-CM | POA: Diagnosis not present

## 2021-02-21 DIAGNOSIS — D631 Anemia in chronic kidney disease: Secondary | ICD-10-CM | POA: Diagnosis not present

## 2021-02-21 DIAGNOSIS — N186 End stage renal disease: Secondary | ICD-10-CM | POA: Diagnosis not present

## 2021-02-22 DIAGNOSIS — N186 End stage renal disease: Secondary | ICD-10-CM | POA: Diagnosis not present

## 2021-02-22 DIAGNOSIS — Z992 Dependence on renal dialysis: Secondary | ICD-10-CM | POA: Diagnosis not present

## 2021-02-22 DIAGNOSIS — D631 Anemia in chronic kidney disease: Secondary | ICD-10-CM | POA: Diagnosis not present

## 2021-02-22 DIAGNOSIS — D509 Iron deficiency anemia, unspecified: Secondary | ICD-10-CM | POA: Diagnosis not present

## 2021-02-23 DIAGNOSIS — D631 Anemia in chronic kidney disease: Secondary | ICD-10-CM | POA: Diagnosis not present

## 2021-02-23 DIAGNOSIS — Z992 Dependence on renal dialysis: Secondary | ICD-10-CM | POA: Diagnosis not present

## 2021-02-23 DIAGNOSIS — D509 Iron deficiency anemia, unspecified: Secondary | ICD-10-CM | POA: Diagnosis not present

## 2021-02-23 DIAGNOSIS — N186 End stage renal disease: Secondary | ICD-10-CM | POA: Diagnosis not present

## 2021-02-24 DIAGNOSIS — N186 End stage renal disease: Secondary | ICD-10-CM | POA: Diagnosis not present

## 2021-02-24 DIAGNOSIS — D631 Anemia in chronic kidney disease: Secondary | ICD-10-CM | POA: Diagnosis not present

## 2021-02-24 DIAGNOSIS — D509 Iron deficiency anemia, unspecified: Secondary | ICD-10-CM | POA: Diagnosis not present

## 2021-02-24 DIAGNOSIS — Z992 Dependence on renal dialysis: Secondary | ICD-10-CM | POA: Diagnosis not present

## 2021-02-25 DIAGNOSIS — Z992 Dependence on renal dialysis: Secondary | ICD-10-CM | POA: Diagnosis not present

## 2021-02-25 DIAGNOSIS — N186 End stage renal disease: Secondary | ICD-10-CM | POA: Diagnosis not present

## 2021-02-25 DIAGNOSIS — D509 Iron deficiency anemia, unspecified: Secondary | ICD-10-CM | POA: Diagnosis not present

## 2021-02-25 DIAGNOSIS — D631 Anemia in chronic kidney disease: Secondary | ICD-10-CM | POA: Diagnosis not present

## 2021-02-26 DIAGNOSIS — D631 Anemia in chronic kidney disease: Secondary | ICD-10-CM | POA: Diagnosis not present

## 2021-02-26 DIAGNOSIS — N186 End stage renal disease: Secondary | ICD-10-CM | POA: Diagnosis not present

## 2021-02-26 DIAGNOSIS — Z992 Dependence on renal dialysis: Secondary | ICD-10-CM | POA: Diagnosis not present

## 2021-02-26 DIAGNOSIS — D509 Iron deficiency anemia, unspecified: Secondary | ICD-10-CM | POA: Diagnosis not present

## 2021-02-27 DIAGNOSIS — Z992 Dependence on renal dialysis: Secondary | ICD-10-CM | POA: Diagnosis not present

## 2021-02-27 DIAGNOSIS — D631 Anemia in chronic kidney disease: Secondary | ICD-10-CM | POA: Diagnosis not present

## 2021-02-27 DIAGNOSIS — D509 Iron deficiency anemia, unspecified: Secondary | ICD-10-CM | POA: Diagnosis not present

## 2021-02-27 DIAGNOSIS — N186 End stage renal disease: Secondary | ICD-10-CM | POA: Diagnosis not present

## 2021-02-28 DIAGNOSIS — D631 Anemia in chronic kidney disease: Secondary | ICD-10-CM | POA: Diagnosis not present

## 2021-02-28 DIAGNOSIS — Z992 Dependence on renal dialysis: Secondary | ICD-10-CM | POA: Diagnosis not present

## 2021-02-28 DIAGNOSIS — N186 End stage renal disease: Secondary | ICD-10-CM | POA: Diagnosis not present

## 2021-02-28 DIAGNOSIS — D509 Iron deficiency anemia, unspecified: Secondary | ICD-10-CM | POA: Diagnosis not present

## 2021-03-01 DIAGNOSIS — D631 Anemia in chronic kidney disease: Secondary | ICD-10-CM | POA: Diagnosis not present

## 2021-03-01 DIAGNOSIS — N186 End stage renal disease: Secondary | ICD-10-CM | POA: Diagnosis not present

## 2021-03-01 DIAGNOSIS — Z992 Dependence on renal dialysis: Secondary | ICD-10-CM | POA: Diagnosis not present

## 2021-03-01 DIAGNOSIS — D509 Iron deficiency anemia, unspecified: Secondary | ICD-10-CM | POA: Diagnosis not present

## 2021-03-02 DIAGNOSIS — Z992 Dependence on renal dialysis: Secondary | ICD-10-CM | POA: Diagnosis not present

## 2021-03-02 DIAGNOSIS — D509 Iron deficiency anemia, unspecified: Secondary | ICD-10-CM | POA: Diagnosis not present

## 2021-03-02 DIAGNOSIS — D631 Anemia in chronic kidney disease: Secondary | ICD-10-CM | POA: Diagnosis not present

## 2021-03-02 DIAGNOSIS — N186 End stage renal disease: Secondary | ICD-10-CM | POA: Diagnosis not present

## 2021-03-03 DIAGNOSIS — D509 Iron deficiency anemia, unspecified: Secondary | ICD-10-CM | POA: Diagnosis not present

## 2021-03-03 DIAGNOSIS — N186 End stage renal disease: Secondary | ICD-10-CM | POA: Diagnosis not present

## 2021-03-03 DIAGNOSIS — Z992 Dependence on renal dialysis: Secondary | ICD-10-CM | POA: Diagnosis not present

## 2021-03-03 DIAGNOSIS — D631 Anemia in chronic kidney disease: Secondary | ICD-10-CM | POA: Diagnosis not present

## 2021-03-04 DIAGNOSIS — D509 Iron deficiency anemia, unspecified: Secondary | ICD-10-CM | POA: Diagnosis not present

## 2021-03-04 DIAGNOSIS — Z992 Dependence on renal dialysis: Secondary | ICD-10-CM | POA: Diagnosis not present

## 2021-03-04 DIAGNOSIS — N186 End stage renal disease: Secondary | ICD-10-CM | POA: Diagnosis not present

## 2021-03-04 DIAGNOSIS — D631 Anemia in chronic kidney disease: Secondary | ICD-10-CM | POA: Diagnosis not present

## 2021-03-05 DIAGNOSIS — N186 End stage renal disease: Secondary | ICD-10-CM | POA: Diagnosis not present

## 2021-03-05 DIAGNOSIS — D631 Anemia in chronic kidney disease: Secondary | ICD-10-CM | POA: Diagnosis not present

## 2021-03-05 DIAGNOSIS — D509 Iron deficiency anemia, unspecified: Secondary | ICD-10-CM | POA: Diagnosis not present

## 2021-03-05 DIAGNOSIS — Z992 Dependence on renal dialysis: Secondary | ICD-10-CM | POA: Diagnosis not present

## 2021-03-06 DIAGNOSIS — D509 Iron deficiency anemia, unspecified: Secondary | ICD-10-CM | POA: Diagnosis not present

## 2021-03-06 DIAGNOSIS — Z992 Dependence on renal dialysis: Secondary | ICD-10-CM | POA: Diagnosis not present

## 2021-03-06 DIAGNOSIS — D631 Anemia in chronic kidney disease: Secondary | ICD-10-CM | POA: Diagnosis not present

## 2021-03-06 DIAGNOSIS — N186 End stage renal disease: Secondary | ICD-10-CM | POA: Diagnosis not present

## 2021-03-07 DIAGNOSIS — N186 End stage renal disease: Secondary | ICD-10-CM | POA: Diagnosis not present

## 2021-03-07 DIAGNOSIS — Z992 Dependence on renal dialysis: Secondary | ICD-10-CM | POA: Diagnosis not present

## 2021-03-07 DIAGNOSIS — D509 Iron deficiency anemia, unspecified: Secondary | ICD-10-CM | POA: Diagnosis not present

## 2021-03-07 DIAGNOSIS — D631 Anemia in chronic kidney disease: Secondary | ICD-10-CM | POA: Diagnosis not present

## 2021-03-08 DIAGNOSIS — Z992 Dependence on renal dialysis: Secondary | ICD-10-CM | POA: Diagnosis not present

## 2021-03-08 DIAGNOSIS — D509 Iron deficiency anemia, unspecified: Secondary | ICD-10-CM | POA: Diagnosis not present

## 2021-03-08 DIAGNOSIS — D631 Anemia in chronic kidney disease: Secondary | ICD-10-CM | POA: Diagnosis not present

## 2021-03-08 DIAGNOSIS — N186 End stage renal disease: Secondary | ICD-10-CM | POA: Diagnosis not present

## 2021-03-09 DIAGNOSIS — N186 End stage renal disease: Secondary | ICD-10-CM | POA: Diagnosis not present

## 2021-03-09 DIAGNOSIS — D631 Anemia in chronic kidney disease: Secondary | ICD-10-CM | POA: Diagnosis not present

## 2021-03-09 DIAGNOSIS — D509 Iron deficiency anemia, unspecified: Secondary | ICD-10-CM | POA: Diagnosis not present

## 2021-03-09 DIAGNOSIS — Z992 Dependence on renal dialysis: Secondary | ICD-10-CM | POA: Diagnosis not present

## 2021-03-10 DIAGNOSIS — D509 Iron deficiency anemia, unspecified: Secondary | ICD-10-CM | POA: Diagnosis not present

## 2021-03-10 DIAGNOSIS — D631 Anemia in chronic kidney disease: Secondary | ICD-10-CM | POA: Diagnosis not present

## 2021-03-10 DIAGNOSIS — N186 End stage renal disease: Secondary | ICD-10-CM | POA: Diagnosis not present

## 2021-03-10 DIAGNOSIS — Z992 Dependence on renal dialysis: Secondary | ICD-10-CM | POA: Diagnosis not present

## 2021-03-11 DIAGNOSIS — Z23 Encounter for immunization: Secondary | ICD-10-CM | POA: Diagnosis not present

## 2021-03-11 DIAGNOSIS — D631 Anemia in chronic kidney disease: Secondary | ICD-10-CM | POA: Diagnosis not present

## 2021-03-11 DIAGNOSIS — D509 Iron deficiency anemia, unspecified: Secondary | ICD-10-CM | POA: Diagnosis not present

## 2021-03-11 DIAGNOSIS — N186 End stage renal disease: Secondary | ICD-10-CM | POA: Diagnosis not present

## 2021-03-11 DIAGNOSIS — Z992 Dependence on renal dialysis: Secondary | ICD-10-CM | POA: Diagnosis not present

## 2021-03-12 DIAGNOSIS — Z992 Dependence on renal dialysis: Secondary | ICD-10-CM | POA: Diagnosis not present

## 2021-03-12 DIAGNOSIS — D509 Iron deficiency anemia, unspecified: Secondary | ICD-10-CM | POA: Diagnosis not present

## 2021-03-12 DIAGNOSIS — D631 Anemia in chronic kidney disease: Secondary | ICD-10-CM | POA: Diagnosis not present

## 2021-03-12 DIAGNOSIS — N186 End stage renal disease: Secondary | ICD-10-CM | POA: Diagnosis not present

## 2021-03-13 DIAGNOSIS — D631 Anemia in chronic kidney disease: Secondary | ICD-10-CM | POA: Diagnosis not present

## 2021-03-13 DIAGNOSIS — D509 Iron deficiency anemia, unspecified: Secondary | ICD-10-CM | POA: Diagnosis not present

## 2021-03-13 DIAGNOSIS — Z992 Dependence on renal dialysis: Secondary | ICD-10-CM | POA: Diagnosis not present

## 2021-03-13 DIAGNOSIS — N186 End stage renal disease: Secondary | ICD-10-CM | POA: Diagnosis not present

## 2021-03-14 DIAGNOSIS — Z992 Dependence on renal dialysis: Secondary | ICD-10-CM | POA: Diagnosis not present

## 2021-03-14 DIAGNOSIS — D509 Iron deficiency anemia, unspecified: Secondary | ICD-10-CM | POA: Diagnosis not present

## 2021-03-14 DIAGNOSIS — D631 Anemia in chronic kidney disease: Secondary | ICD-10-CM | POA: Diagnosis not present

## 2021-03-14 DIAGNOSIS — N186 End stage renal disease: Secondary | ICD-10-CM | POA: Diagnosis not present

## 2021-03-15 DIAGNOSIS — D631 Anemia in chronic kidney disease: Secondary | ICD-10-CM | POA: Diagnosis not present

## 2021-03-15 DIAGNOSIS — Z992 Dependence on renal dialysis: Secondary | ICD-10-CM | POA: Diagnosis not present

## 2021-03-15 DIAGNOSIS — D509 Iron deficiency anemia, unspecified: Secondary | ICD-10-CM | POA: Diagnosis not present

## 2021-03-15 DIAGNOSIS — N186 End stage renal disease: Secondary | ICD-10-CM | POA: Diagnosis not present

## 2021-03-16 DIAGNOSIS — D509 Iron deficiency anemia, unspecified: Secondary | ICD-10-CM | POA: Diagnosis not present

## 2021-03-16 DIAGNOSIS — Z992 Dependence on renal dialysis: Secondary | ICD-10-CM | POA: Diagnosis not present

## 2021-03-16 DIAGNOSIS — D631 Anemia in chronic kidney disease: Secondary | ICD-10-CM | POA: Diagnosis not present

## 2021-03-16 DIAGNOSIS — N186 End stage renal disease: Secondary | ICD-10-CM | POA: Diagnosis not present

## 2021-03-17 DIAGNOSIS — N186 End stage renal disease: Secondary | ICD-10-CM | POA: Diagnosis not present

## 2021-03-17 DIAGNOSIS — Z992 Dependence on renal dialysis: Secondary | ICD-10-CM | POA: Diagnosis not present

## 2021-03-17 DIAGNOSIS — D631 Anemia in chronic kidney disease: Secondary | ICD-10-CM | POA: Diagnosis not present

## 2021-03-17 DIAGNOSIS — D509 Iron deficiency anemia, unspecified: Secondary | ICD-10-CM | POA: Diagnosis not present

## 2021-03-18 DIAGNOSIS — D509 Iron deficiency anemia, unspecified: Secondary | ICD-10-CM | POA: Diagnosis not present

## 2021-03-18 DIAGNOSIS — D631 Anemia in chronic kidney disease: Secondary | ICD-10-CM | POA: Diagnosis not present

## 2021-03-18 DIAGNOSIS — Z992 Dependence on renal dialysis: Secondary | ICD-10-CM | POA: Diagnosis not present

## 2021-03-18 DIAGNOSIS — N186 End stage renal disease: Secondary | ICD-10-CM | POA: Diagnosis not present

## 2021-03-19 DIAGNOSIS — D509 Iron deficiency anemia, unspecified: Secondary | ICD-10-CM | POA: Diagnosis not present

## 2021-03-19 DIAGNOSIS — Z992 Dependence on renal dialysis: Secondary | ICD-10-CM | POA: Diagnosis not present

## 2021-03-19 DIAGNOSIS — D631 Anemia in chronic kidney disease: Secondary | ICD-10-CM | POA: Diagnosis not present

## 2021-03-19 DIAGNOSIS — N186 End stage renal disease: Secondary | ICD-10-CM | POA: Diagnosis not present

## 2021-03-20 DIAGNOSIS — Z992 Dependence on renal dialysis: Secondary | ICD-10-CM | POA: Diagnosis not present

## 2021-03-20 DIAGNOSIS — D631 Anemia in chronic kidney disease: Secondary | ICD-10-CM | POA: Diagnosis not present

## 2021-03-20 DIAGNOSIS — D509 Iron deficiency anemia, unspecified: Secondary | ICD-10-CM | POA: Diagnosis not present

## 2021-03-20 DIAGNOSIS — N186 End stage renal disease: Secondary | ICD-10-CM | POA: Diagnosis not present

## 2021-03-21 DIAGNOSIS — D509 Iron deficiency anemia, unspecified: Secondary | ICD-10-CM | POA: Diagnosis not present

## 2021-03-21 DIAGNOSIS — U071 COVID-19: Secondary | ICD-10-CM | POA: Diagnosis not present

## 2021-03-21 DIAGNOSIS — N186 End stage renal disease: Secondary | ICD-10-CM | POA: Diagnosis not present

## 2021-03-21 DIAGNOSIS — Z992 Dependence on renal dialysis: Secondary | ICD-10-CM | POA: Diagnosis not present

## 2021-03-21 DIAGNOSIS — D631 Anemia in chronic kidney disease: Secondary | ICD-10-CM | POA: Diagnosis not present

## 2021-03-22 DIAGNOSIS — D631 Anemia in chronic kidney disease: Secondary | ICD-10-CM | POA: Diagnosis not present

## 2021-03-22 DIAGNOSIS — Z992 Dependence on renal dialysis: Secondary | ICD-10-CM | POA: Diagnosis not present

## 2021-03-22 DIAGNOSIS — N186 End stage renal disease: Secondary | ICD-10-CM | POA: Diagnosis not present

## 2021-03-22 DIAGNOSIS — D509 Iron deficiency anemia, unspecified: Secondary | ICD-10-CM | POA: Diagnosis not present

## 2021-03-23 DIAGNOSIS — Z992 Dependence on renal dialysis: Secondary | ICD-10-CM | POA: Diagnosis not present

## 2021-03-23 DIAGNOSIS — D509 Iron deficiency anemia, unspecified: Secondary | ICD-10-CM | POA: Diagnosis not present

## 2021-03-23 DIAGNOSIS — N186 End stage renal disease: Secondary | ICD-10-CM | POA: Diagnosis not present

## 2021-03-23 DIAGNOSIS — D631 Anemia in chronic kidney disease: Secondary | ICD-10-CM | POA: Diagnosis not present

## 2021-03-24 DIAGNOSIS — D509 Iron deficiency anemia, unspecified: Secondary | ICD-10-CM | POA: Diagnosis not present

## 2021-03-24 DIAGNOSIS — Z992 Dependence on renal dialysis: Secondary | ICD-10-CM | POA: Diagnosis not present

## 2021-03-24 DIAGNOSIS — N186 End stage renal disease: Secondary | ICD-10-CM | POA: Diagnosis not present

## 2021-03-25 DIAGNOSIS — Z992 Dependence on renal dialysis: Secondary | ICD-10-CM | POA: Diagnosis not present

## 2021-03-25 DIAGNOSIS — N186 End stage renal disease: Secondary | ICD-10-CM | POA: Diagnosis not present

## 2021-03-25 DIAGNOSIS — D509 Iron deficiency anemia, unspecified: Secondary | ICD-10-CM | POA: Diagnosis not present

## 2021-03-26 DIAGNOSIS — N186 End stage renal disease: Secondary | ICD-10-CM | POA: Diagnosis not present

## 2021-03-26 DIAGNOSIS — D509 Iron deficiency anemia, unspecified: Secondary | ICD-10-CM | POA: Diagnosis not present

## 2021-03-26 DIAGNOSIS — Z992 Dependence on renal dialysis: Secondary | ICD-10-CM | POA: Diagnosis not present

## 2021-03-27 DIAGNOSIS — Z992 Dependence on renal dialysis: Secondary | ICD-10-CM | POA: Diagnosis not present

## 2021-03-27 DIAGNOSIS — N186 End stage renal disease: Secondary | ICD-10-CM | POA: Diagnosis not present

## 2021-03-27 DIAGNOSIS — D509 Iron deficiency anemia, unspecified: Secondary | ICD-10-CM | POA: Diagnosis not present

## 2021-03-28 DIAGNOSIS — N186 End stage renal disease: Secondary | ICD-10-CM | POA: Diagnosis not present

## 2021-03-28 DIAGNOSIS — D509 Iron deficiency anemia, unspecified: Secondary | ICD-10-CM | POA: Diagnosis not present

## 2021-03-28 DIAGNOSIS — Z992 Dependence on renal dialysis: Secondary | ICD-10-CM | POA: Diagnosis not present

## 2021-03-29 DIAGNOSIS — N186 End stage renal disease: Secondary | ICD-10-CM | POA: Diagnosis not present

## 2021-03-29 DIAGNOSIS — D509 Iron deficiency anemia, unspecified: Secondary | ICD-10-CM | POA: Diagnosis not present

## 2021-03-29 DIAGNOSIS — Z992 Dependence on renal dialysis: Secondary | ICD-10-CM | POA: Diagnosis not present

## 2021-03-30 DIAGNOSIS — D509 Iron deficiency anemia, unspecified: Secondary | ICD-10-CM | POA: Diagnosis not present

## 2021-03-30 DIAGNOSIS — Z992 Dependence on renal dialysis: Secondary | ICD-10-CM | POA: Diagnosis not present

## 2021-03-30 DIAGNOSIS — N186 End stage renal disease: Secondary | ICD-10-CM | POA: Diagnosis not present

## 2021-03-31 ENCOUNTER — Other Ambulatory Visit: Payer: Self-pay | Admitting: Infectious Diseases

## 2021-03-31 DIAGNOSIS — Z01818 Encounter for other preprocedural examination: Secondary | ICD-10-CM

## 2021-03-31 DIAGNOSIS — T8610 Unspecified complication of kidney transplant: Secondary | ICD-10-CM

## 2021-03-31 DIAGNOSIS — D509 Iron deficiency anemia, unspecified: Secondary | ICD-10-CM | POA: Diagnosis not present

## 2021-03-31 DIAGNOSIS — N186 End stage renal disease: Secondary | ICD-10-CM | POA: Diagnosis not present

## 2021-03-31 DIAGNOSIS — Z992 Dependence on renal dialysis: Secondary | ICD-10-CM | POA: Diagnosis not present

## 2021-04-01 DIAGNOSIS — N186 End stage renal disease: Secondary | ICD-10-CM | POA: Diagnosis not present

## 2021-04-01 DIAGNOSIS — Z992 Dependence on renal dialysis: Secondary | ICD-10-CM | POA: Diagnosis not present

## 2021-04-01 DIAGNOSIS — D509 Iron deficiency anemia, unspecified: Secondary | ICD-10-CM | POA: Diagnosis not present

## 2021-04-02 DIAGNOSIS — Z992 Dependence on renal dialysis: Secondary | ICD-10-CM | POA: Diagnosis not present

## 2021-04-02 DIAGNOSIS — D509 Iron deficiency anemia, unspecified: Secondary | ICD-10-CM | POA: Diagnosis not present

## 2021-04-02 DIAGNOSIS — N186 End stage renal disease: Secondary | ICD-10-CM | POA: Diagnosis not present

## 2021-04-03 DIAGNOSIS — D509 Iron deficiency anemia, unspecified: Secondary | ICD-10-CM | POA: Diagnosis not present

## 2021-04-03 DIAGNOSIS — N186 End stage renal disease: Secondary | ICD-10-CM | POA: Diagnosis not present

## 2021-04-03 DIAGNOSIS — Z992 Dependence on renal dialysis: Secondary | ICD-10-CM | POA: Diagnosis not present

## 2021-04-04 DIAGNOSIS — N186 End stage renal disease: Secondary | ICD-10-CM | POA: Diagnosis not present

## 2021-04-04 DIAGNOSIS — Z992 Dependence on renal dialysis: Secondary | ICD-10-CM | POA: Diagnosis not present

## 2021-04-04 DIAGNOSIS — D509 Iron deficiency anemia, unspecified: Secondary | ICD-10-CM | POA: Diagnosis not present

## 2021-04-05 DIAGNOSIS — D509 Iron deficiency anemia, unspecified: Secondary | ICD-10-CM | POA: Diagnosis not present

## 2021-04-05 DIAGNOSIS — Z992 Dependence on renal dialysis: Secondary | ICD-10-CM | POA: Diagnosis not present

## 2021-04-05 DIAGNOSIS — N186 End stage renal disease: Secondary | ICD-10-CM | POA: Diagnosis not present

## 2021-04-06 DIAGNOSIS — D509 Iron deficiency anemia, unspecified: Secondary | ICD-10-CM | POA: Diagnosis not present

## 2021-04-06 DIAGNOSIS — N186 End stage renal disease: Secondary | ICD-10-CM | POA: Diagnosis not present

## 2021-04-06 DIAGNOSIS — Z992 Dependence on renal dialysis: Secondary | ICD-10-CM | POA: Diagnosis not present

## 2021-04-07 DIAGNOSIS — N186 End stage renal disease: Secondary | ICD-10-CM | POA: Diagnosis not present

## 2021-04-07 DIAGNOSIS — Z992 Dependence on renal dialysis: Secondary | ICD-10-CM | POA: Diagnosis not present

## 2021-04-07 DIAGNOSIS — D509 Iron deficiency anemia, unspecified: Secondary | ICD-10-CM | POA: Diagnosis not present

## 2021-04-08 ENCOUNTER — Other Ambulatory Visit: Payer: Self-pay

## 2021-04-08 ENCOUNTER — Ambulatory Visit
Admission: RE | Admit: 2021-04-08 | Discharge: 2021-04-08 | Disposition: A | Discharge status: Home / Self Care | Payer: Medicare Other | Source: Ambulatory Visit | Attending: Infectious Diseases | Admitting: Infectious Diseases

## 2021-04-08 DIAGNOSIS — D509 Iron deficiency anemia, unspecified: Secondary | ICD-10-CM | POA: Diagnosis not present

## 2021-04-08 DIAGNOSIS — Z992 Dependence on renal dialysis: Secondary | ICD-10-CM | POA: Diagnosis not present

## 2021-04-08 DIAGNOSIS — K5730 Diverticulosis of large intestine without perforation or abscess without bleeding: Secondary | ICD-10-CM | POA: Diagnosis not present

## 2021-04-08 DIAGNOSIS — D179 Benign lipomatous neoplasm, unspecified: Secondary | ICD-10-CM | POA: Diagnosis not present

## 2021-04-08 DIAGNOSIS — K8020 Calculus of gallbladder without cholecystitis without obstruction: Secondary | ICD-10-CM | POA: Diagnosis not present

## 2021-04-08 DIAGNOSIS — T8610 Unspecified complication of kidney transplant: Secondary | ICD-10-CM | POA: Diagnosis not present

## 2021-04-08 DIAGNOSIS — Z01818 Encounter for other preprocedural examination: Secondary | ICD-10-CM

## 2021-04-08 DIAGNOSIS — K4020 Bilateral inguinal hernia, without obstruction or gangrene, not specified as recurrent: Secondary | ICD-10-CM | POA: Diagnosis not present

## 2021-04-08 DIAGNOSIS — N186 End stage renal disease: Secondary | ICD-10-CM | POA: Diagnosis not present

## 2021-04-09 DIAGNOSIS — D509 Iron deficiency anemia, unspecified: Secondary | ICD-10-CM | POA: Diagnosis not present

## 2021-04-09 DIAGNOSIS — Z992 Dependence on renal dialysis: Secondary | ICD-10-CM | POA: Diagnosis not present

## 2021-04-09 DIAGNOSIS — N186 End stage renal disease: Secondary | ICD-10-CM | POA: Diagnosis not present

## 2021-04-10 DIAGNOSIS — D509 Iron deficiency anemia, unspecified: Secondary | ICD-10-CM | POA: Diagnosis not present

## 2021-04-10 DIAGNOSIS — N186 End stage renal disease: Secondary | ICD-10-CM | POA: Diagnosis not present

## 2021-04-10 DIAGNOSIS — Z992 Dependence on renal dialysis: Secondary | ICD-10-CM | POA: Diagnosis not present

## 2021-04-11 DIAGNOSIS — Z992 Dependence on renal dialysis: Secondary | ICD-10-CM | POA: Diagnosis not present

## 2021-04-11 DIAGNOSIS — D509 Iron deficiency anemia, unspecified: Secondary | ICD-10-CM | POA: Diagnosis not present

## 2021-04-11 DIAGNOSIS — N186 End stage renal disease: Secondary | ICD-10-CM | POA: Diagnosis not present

## 2021-04-12 DIAGNOSIS — D509 Iron deficiency anemia, unspecified: Secondary | ICD-10-CM | POA: Diagnosis not present

## 2021-04-12 DIAGNOSIS — N186 End stage renal disease: Secondary | ICD-10-CM | POA: Diagnosis not present

## 2021-04-12 DIAGNOSIS — Z992 Dependence on renal dialysis: Secondary | ICD-10-CM | POA: Diagnosis not present

## 2021-04-13 DIAGNOSIS — D509 Iron deficiency anemia, unspecified: Secondary | ICD-10-CM | POA: Diagnosis not present

## 2021-04-13 DIAGNOSIS — N186 End stage renal disease: Secondary | ICD-10-CM | POA: Diagnosis not present

## 2021-04-13 DIAGNOSIS — Z992 Dependence on renal dialysis: Secondary | ICD-10-CM | POA: Diagnosis not present

## 2021-04-14 DIAGNOSIS — Z992 Dependence on renal dialysis: Secondary | ICD-10-CM | POA: Diagnosis not present

## 2021-04-14 DIAGNOSIS — D509 Iron deficiency anemia, unspecified: Secondary | ICD-10-CM | POA: Diagnosis not present

## 2021-04-14 DIAGNOSIS — N186 End stage renal disease: Secondary | ICD-10-CM | POA: Diagnosis not present

## 2021-04-15 DIAGNOSIS — Z992 Dependence on renal dialysis: Secondary | ICD-10-CM | POA: Diagnosis not present

## 2021-04-15 DIAGNOSIS — D509 Iron deficiency anemia, unspecified: Secondary | ICD-10-CM | POA: Diagnosis not present

## 2021-04-15 DIAGNOSIS — N186 End stage renal disease: Secondary | ICD-10-CM | POA: Diagnosis not present

## 2021-04-16 DIAGNOSIS — N186 End stage renal disease: Secondary | ICD-10-CM | POA: Diagnosis not present

## 2021-04-16 DIAGNOSIS — Z992 Dependence on renal dialysis: Secondary | ICD-10-CM | POA: Diagnosis not present

## 2021-04-16 DIAGNOSIS — D509 Iron deficiency anemia, unspecified: Secondary | ICD-10-CM | POA: Diagnosis not present

## 2021-04-17 DIAGNOSIS — Z992 Dependence on renal dialysis: Secondary | ICD-10-CM | POA: Diagnosis not present

## 2021-04-17 DIAGNOSIS — N186 End stage renal disease: Secondary | ICD-10-CM | POA: Diagnosis not present

## 2021-04-17 DIAGNOSIS — D509 Iron deficiency anemia, unspecified: Secondary | ICD-10-CM | POA: Diagnosis not present

## 2021-04-18 DIAGNOSIS — Z992 Dependence on renal dialysis: Secondary | ICD-10-CM | POA: Diagnosis not present

## 2021-04-18 DIAGNOSIS — N186 End stage renal disease: Secondary | ICD-10-CM | POA: Diagnosis not present

## 2021-04-18 DIAGNOSIS — D509 Iron deficiency anemia, unspecified: Secondary | ICD-10-CM | POA: Diagnosis not present

## 2021-04-19 ENCOUNTER — Other Ambulatory Visit: Payer: Self-pay | Admitting: General Surgery

## 2021-04-19 DIAGNOSIS — Z992 Dependence on renal dialysis: Secondary | ICD-10-CM | POA: Diagnosis not present

## 2021-04-19 DIAGNOSIS — Z1211 Encounter for screening for malignant neoplasm of colon: Secondary | ICD-10-CM | POA: Diagnosis not present

## 2021-04-19 DIAGNOSIS — D509 Iron deficiency anemia, unspecified: Secondary | ICD-10-CM | POA: Diagnosis not present

## 2021-04-19 DIAGNOSIS — N186 End stage renal disease: Secondary | ICD-10-CM | POA: Diagnosis not present

## 2021-04-19 NOTE — Progress Notes (Signed)
Subjective:     Patient ID: Roger Knight is a 70 y.o. male.   HPI   The following portions of the patient's history were reviewed and updated as appropriate.   This an established patient is here today for: office visit. Here to discuss having a colonoscopy. Patient is needing to have a kidney transplant and this is the last check-off on the list. He reports his daughter is an approved donor.    Patient reports his bowel movements are every other day. He denies any rectal bleeding or mucus.    The patient's last colonoscopy was completed in 2017.       Chief Complaint  Patient presents with   Pre-op Exam      BP 118/60    Pulse 61    Temp 36.3 C (97.3 F)    Ht 170.2 cm (5' 7" )    Wt 87.5 kg (193 lb)    SpO2 97%    BMI 30.23 kg/m        Past Medical History:  Diagnosis Date   Anemia      GI wu neg except for possible oozing while on effient.  Fe deficient by labs at Dr [name] Gip   CKD (chronic kidney disease) stage 4, GFR 15-29 ml/min (CMS-HCC)     Congestive heart failure (CHF) (CMS-HCC)      12/2018: Echocardiogram - LV function EF 32% with inferior scar with no reversible ischemia.    Coronary artery disease      S/P PCI with stent placement, s/p 4V CABG   Delayed gastric emptying      04/08/13 at [facility]   Diverticula, colon      colonoscopy 07/2012   Dyslipidemia     ED (erectile dysfunction)     Essential hypertension, benign     GERD (gastroesophageal reflux disease)     GI bleed      hospitalization 03/2013, attributed to hemorrhoids   Hiatal hernia     History of basal cell cancer      nose   MGUS (monoclonal gammopathy of unknown significance)      Repeat neg at Dr Drenda Freeze   Osteoarthritis     Other and unspecified hyperlipidemia     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)     Type 2 diabetes mellitus with peripheral neuropathy (CMS-HCC)             Past Surgical History:  Procedure Laterality Date   CORONARY ARTERY BYPASS  GRAFT   09/12/1994    4-vessel CABG   COLONOSCOPY   07/2012   COLONOSCOPY   05/13/2015    In-Patient---Diverticulosis/Repeat per [facility] recommendations/Repeat in 2019/MGR   EGD   05/13/2015    In-Patient---gastric polyps/Repeat 68month/MGR   heart cath and cors/graft angiography Left 01/24/2021   MOHS SURGERY        for BCC on nose   PCI with stent placement       VASECTOMY              Social History           Socioeconomic History   Marital status: Married      Spouse name: LEldwin Volkov  Number of children: 1   Years of education: 12+   Highest education level: Some college, no degree  Occupational History   Occupation: Retired  Tobacco Use   Smoking status: Former      Packs/day: 1.00      Years: 10.00  Pack years: 10.00      Types: Cigarettes      Quit date: 05/25/2006      Years since quitting: 14.9   Smokeless tobacco: Former      Types: Chew      Quit date: 2008  Vaping Use   Vaping Use: Never used  Substance and Sexual Activity   Alcohol use: No      Alcohol/week: 0.0 standard drinks   Drug use: No   Sexual activity: Yes      Partners: Female      Birth control/protection: Surgical  Social History Narrative    He is married. - one child and 2 stepchidlren. 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    He does not smoke cigarettes.  He used to smoke however quit in 1996.  No alcohol use.        No Known Allergies   Current Medications        Current Outpatient Medications  Medication Sig Dispense Refill   amLODIPine (NORVASC) 5 MG tablet Take 1 tablet (5 mg total) by mouth once daily 90 tablet 3   aspirin 81 MG EC tablet Take 81 mg by mouth once daily.       atorvastatin (LIPITOR) 80 MG tablet TAKE ONE TABLET BY MOUTH EVERY EVENING 90 tablet 3   calcitRIOL (ROCALTROL) 0.25 MCG capsule Take 1 capsule by mouth once daily       calcium polycarbophiL (FIBERCON) 625 mg tablet Take 2 tablets by mouth 2 (two) times daily        cetirizine (ZYRTEC) 10 MG tablet Take 10 mg by mouth once daily.       ezetimibe (ZETIA) 10 mg tablet TAKE ONE TABLET BY MOUTH EVERY MORNING 90 tablet 3   gabapentin (NEURONTIN) 300 MG capsule Take 1 capsule (300 mg total) by mouth nightly 90 capsule 3   insulin ASPART (NOVOLOG FLEXPEN U-100 INSULIN) pen injector (concentration 100 units/mL) Inject 20 units three times daily before meals 60 mL 5   insulin DEGLUDEC (TRESIBA FLEXTOUCH U-200) pen injector (concentration 200 units/mL) Inject 90 Units subcutaneously nightly 18 mL 5   metoprolol succinate (TOPROL-XL) 200 MG XL tablet Take 1 tablet (200 mg total) by mouth once daily 90 tablet 3   MULTIVITAMIN ORAL Take 1 tablet by mouth once daily.          pantoprazole (PROTONIX) 40 MG DR tablet Take 1 tablet (40 mg total) by mouth 2 (two) times daily 180 tablet 3   sildenafil (REVATIO) 20 mg tablet TAKE FIVE TABLETS BY MOUTH ONCE DAILY 150 tablet 3   spironolactone (ALDACTONE) 25 MG tablet Take 1 tablet (25 mg total) by mouth 2 (two) times daily 180 tablet 3   valsartan-hydrochlorothiazide (DIOVAN-HCT) 320-25 mg tablet Take 1 tablet by mouth once daily 90 tablet 3   codeine-guaifenesin 10-100 mg/5 mL oral liquid Take 5 mLs by mouth every 6 (six) hours as needed (Patient not taking: Reported on 01/17/2021) 118 mL 0   colchicine (COLCRYS) 0.6 mg tablet Take 0.6 mg by mouth 2 (two) times daily (Patient not taking: Reported on 01/17/2021)        No current facility-administered medications for this visit.             Family History  Problem Relation Age of Onset   Cancer Mother     Osteoarthritis Mother     Dementia Mother     Diabetes Neg Hx  Heart disease Neg Hx     High blood pressure (Hypertension) Neg Hx     Kidney disease Neg Hx     Thyroid disease Neg Hx     Colon cancer Neg Hx     Breast cancer Neg Hx          Labs and Radiology:    January 17, 2021 laboratory review:   Glucose 70 - 110 mg/dL 195 High    Sodium 136 - 145  mmol/L 135 Low    Potassium 3.6 - 5.1 mmol/L 3.6   Chloride 97 - 109 mmol/L 96 Low    Carbon Dioxide (CO2) 22.0 - 32.0 mmol/L 30.4   Calcium 8.7 - 10.3 mg/dL 11.0 High    Urea Nitrogen (BUN) 7 - 25 mg/dL 37 High    Creatinine 0.7 - 1.3 mg/dL 3.8 High    Glomerular Filtration Rate (eGFR), MDRD Estimate >60 mL/min/1.73sq m 16 Low      WBC (White Blood Cell Count) 4.1 - 10.2 103/uL 7.1   RBC (Red Blood Cell Count) 4.69 - 6.13 106/uL 3.56 Low    Hemoglobin 14.1 - 18.1 gm/dL 11.3 Low    Hematocrit 40.0 - 52.0 % 32.9 Low    MCV (Mean Corpuscular Volume) 80.0 - 100.0 fl 92.4   MCH (Mean Corpuscular Hemoglobin) 27.0 - 31.2 pg 31.7 High    MCHC (Mean Corpuscular Hemoglobin Concentration) 32.0 - 36.0 gm/dL 34.3   Platelet Count 150 - 450 103/uL 238   RDW-CV (Red Cell Distribution Width) 11.6 - 14.8 % 13.5   MPV (Mean Platelet Volume) 9.4 - 12.4 fl 9.4   Neutrophils 1.50 - 7.80 103/uL 5.05   Lymphocytes 1.00 - 3.60 103/uL 1.29   Monocytes 0.00 - 1.50 103/uL 0.57   Eosinophils 0.00 - 0.55 103/uL 0.08   Basophils 0.00 - 0.09 103/uL 0.07   Neutrophil % 32.0 - 70.0 % 71.3 High    Lymphocyte % 10.0 - 50.0 % 18.2   Monocyte % 4.0 - 13.0 % 8.1   Eosinophil % 1.0 - 5.0 % 1.1   Basophil% 0.0 - 2.0 % 1.0   Immature Granulocyte % <=0.7 % 0.3   Immature Granulocyte Count <=0.06 10^3/L 0.02     February 01, 2021 cardiology follow-up:   # Preoperative evaluation Patient is easily able to do 4 METS of exertion, but was referred for heart catheterization given his prior inferior scar nuclear medicine stress test.  His heart catheterization showed patent stent in his left main into his LAD, patent LIMA to LAD, and known occluded SVG grafts.  He does have a new 70% stenosis of his ostial left circumflex and OM1, but these do not appear critical.  Given how well he is doing I do not think we necessarily need to revascularize this prior to surgery. -In the perioperative period recommend continuing  aspirin and 200 mg of metoprolol XL daily. -This was discussed at length with the patient who is in agreement with the plan.     Review of Systems  Constitutional: Negative for chills and fever.  Respiratory: Negative for cough.          Objective:   Physical Exam Constitutional:      Appearance: Normal appearance.  Cardiovascular:     Rate and Rhythm: Normal rate and regular rhythm.     Pulses: Normal pulses.     Heart sounds: Murmur heard.   Systolic murmur is present with a grade of 2/6. Pulmonary:     Effort:  Pulmonary effort is normal.     Breath sounds: Normal breath sounds.  Musculoskeletal:     Cervical back: Neck supple.  Skin:    General: Skin is warm and dry.  Neurological:     Mental Status: He is alert and oriented to person, place, and time.  Psychiatric:        Mood and Affect: Mood normal.        Behavior: Behavior normal.           Assessment:     Candidate for colonoscopy as preparation for renal transplant, living related donor.    Plan:     Indications for the procedure were reviewed as well as risks.  Based on his ongoing peritoneal dialysis and the potential for transient bacteremia with colonoscopy he will be asked to make use of amoxicillin 2 g p.o. 1 hour prior to the procedure.   We will have him decrease his Premeal insulin doses by 50% and his evening the insulin dose by 30 units to minimize risk of hyper per glycemia.    This note is partially prepared by Ledell Noss, CMA acting as a scribe in the presence of Dr. Hervey Ard, MD.    This note is partially prepared by Karie Fetch, RN, acting as a scribe in the presence of Dr. Hervey Ard, MD.    The documentation recorded by the scribe accurately reflects the service I personally performed and the decisions made by me.    Robert Bellow, MD FACS

## 2021-04-20 DIAGNOSIS — Z992 Dependence on renal dialysis: Secondary | ICD-10-CM | POA: Diagnosis not present

## 2021-04-20 DIAGNOSIS — N186 End stage renal disease: Secondary | ICD-10-CM | POA: Diagnosis not present

## 2021-04-20 DIAGNOSIS — D509 Iron deficiency anemia, unspecified: Secondary | ICD-10-CM | POA: Diagnosis not present

## 2021-04-21 DIAGNOSIS — Z992 Dependence on renal dialysis: Secondary | ICD-10-CM | POA: Diagnosis not present

## 2021-04-21 DIAGNOSIS — N186 End stage renal disease: Secondary | ICD-10-CM | POA: Diagnosis not present

## 2021-04-21 DIAGNOSIS — D509 Iron deficiency anemia, unspecified: Secondary | ICD-10-CM | POA: Diagnosis not present

## 2021-04-22 DIAGNOSIS — N186 End stage renal disease: Secondary | ICD-10-CM | POA: Diagnosis not present

## 2021-04-22 DIAGNOSIS — D509 Iron deficiency anemia, unspecified: Secondary | ICD-10-CM | POA: Diagnosis not present

## 2021-04-22 DIAGNOSIS — Z992 Dependence on renal dialysis: Secondary | ICD-10-CM | POA: Diagnosis not present

## 2021-04-23 DIAGNOSIS — Z992 Dependence on renal dialysis: Secondary | ICD-10-CM | POA: Diagnosis not present

## 2021-04-23 DIAGNOSIS — N186 End stage renal disease: Secondary | ICD-10-CM | POA: Diagnosis not present

## 2021-04-23 DIAGNOSIS — D509 Iron deficiency anemia, unspecified: Secondary | ICD-10-CM | POA: Diagnosis not present

## 2021-04-24 DIAGNOSIS — N186 End stage renal disease: Secondary | ICD-10-CM | POA: Diagnosis not present

## 2021-04-24 DIAGNOSIS — D631 Anemia in chronic kidney disease: Secondary | ICD-10-CM | POA: Diagnosis not present

## 2021-04-24 DIAGNOSIS — D509 Iron deficiency anemia, unspecified: Secondary | ICD-10-CM | POA: Diagnosis not present

## 2021-04-24 DIAGNOSIS — Z23 Encounter for immunization: Secondary | ICD-10-CM | POA: Diagnosis not present

## 2021-04-24 DIAGNOSIS — E559 Vitamin D deficiency, unspecified: Secondary | ICD-10-CM | POA: Diagnosis not present

## 2021-04-24 DIAGNOSIS — Z992 Dependence on renal dialysis: Secondary | ICD-10-CM | POA: Diagnosis not present

## 2021-04-25 DIAGNOSIS — D509 Iron deficiency anemia, unspecified: Secondary | ICD-10-CM | POA: Diagnosis not present

## 2021-04-25 DIAGNOSIS — Z992 Dependence on renal dialysis: Secondary | ICD-10-CM | POA: Diagnosis not present

## 2021-04-25 DIAGNOSIS — E559 Vitamin D deficiency, unspecified: Secondary | ICD-10-CM | POA: Diagnosis not present

## 2021-04-25 DIAGNOSIS — D631 Anemia in chronic kidney disease: Secondary | ICD-10-CM | POA: Diagnosis not present

## 2021-04-25 DIAGNOSIS — N186 End stage renal disease: Secondary | ICD-10-CM | POA: Diagnosis not present

## 2021-04-25 DIAGNOSIS — Z23 Encounter for immunization: Secondary | ICD-10-CM | POA: Diagnosis not present

## 2021-04-26 DIAGNOSIS — Z794 Long term (current) use of insulin: Secondary | ICD-10-CM | POA: Diagnosis not present

## 2021-04-26 DIAGNOSIS — E119 Type 2 diabetes mellitus without complications: Secondary | ICD-10-CM | POA: Diagnosis not present

## 2021-04-26 DIAGNOSIS — D509 Iron deficiency anemia, unspecified: Secondary | ICD-10-CM | POA: Diagnosis not present

## 2021-04-26 DIAGNOSIS — D631 Anemia in chronic kidney disease: Secondary | ICD-10-CM | POA: Diagnosis not present

## 2021-04-26 DIAGNOSIS — Z992 Dependence on renal dialysis: Secondary | ICD-10-CM | POA: Diagnosis not present

## 2021-04-26 DIAGNOSIS — E559 Vitamin D deficiency, unspecified: Secondary | ICD-10-CM | POA: Diagnosis not present

## 2021-04-26 DIAGNOSIS — Z23 Encounter for immunization: Secondary | ICD-10-CM | POA: Diagnosis not present

## 2021-04-26 DIAGNOSIS — N186 End stage renal disease: Secondary | ICD-10-CM | POA: Diagnosis not present

## 2021-04-27 DIAGNOSIS — D509 Iron deficiency anemia, unspecified: Secondary | ICD-10-CM | POA: Diagnosis not present

## 2021-04-27 DIAGNOSIS — Z992 Dependence on renal dialysis: Secondary | ICD-10-CM | POA: Diagnosis not present

## 2021-04-27 DIAGNOSIS — N186 End stage renal disease: Secondary | ICD-10-CM | POA: Diagnosis not present

## 2021-04-27 DIAGNOSIS — E559 Vitamin D deficiency, unspecified: Secondary | ICD-10-CM | POA: Diagnosis not present

## 2021-04-27 DIAGNOSIS — Z23 Encounter for immunization: Secondary | ICD-10-CM | POA: Diagnosis not present

## 2021-04-27 DIAGNOSIS — D631 Anemia in chronic kidney disease: Secondary | ICD-10-CM | POA: Diagnosis not present

## 2021-04-28 DIAGNOSIS — D509 Iron deficiency anemia, unspecified: Secondary | ICD-10-CM | POA: Diagnosis not present

## 2021-04-28 DIAGNOSIS — N186 End stage renal disease: Secondary | ICD-10-CM | POA: Diagnosis not present

## 2021-04-28 DIAGNOSIS — D631 Anemia in chronic kidney disease: Secondary | ICD-10-CM | POA: Diagnosis not present

## 2021-04-28 DIAGNOSIS — E559 Vitamin D deficiency, unspecified: Secondary | ICD-10-CM | POA: Diagnosis not present

## 2021-04-28 DIAGNOSIS — Z23 Encounter for immunization: Secondary | ICD-10-CM | POA: Diagnosis not present

## 2021-04-28 DIAGNOSIS — Z992 Dependence on renal dialysis: Secondary | ICD-10-CM | POA: Diagnosis not present

## 2021-04-29 DIAGNOSIS — D631 Anemia in chronic kidney disease: Secondary | ICD-10-CM | POA: Diagnosis not present

## 2021-04-29 DIAGNOSIS — E559 Vitamin D deficiency, unspecified: Secondary | ICD-10-CM | POA: Diagnosis not present

## 2021-04-29 DIAGNOSIS — D509 Iron deficiency anemia, unspecified: Secondary | ICD-10-CM | POA: Diagnosis not present

## 2021-04-29 DIAGNOSIS — Z992 Dependence on renal dialysis: Secondary | ICD-10-CM | POA: Diagnosis not present

## 2021-04-29 DIAGNOSIS — Z23 Encounter for immunization: Secondary | ICD-10-CM | POA: Diagnosis not present

## 2021-04-29 DIAGNOSIS — N186 End stage renal disease: Secondary | ICD-10-CM | POA: Diagnosis not present

## 2021-04-30 DIAGNOSIS — Z992 Dependence on renal dialysis: Secondary | ICD-10-CM | POA: Diagnosis not present

## 2021-04-30 DIAGNOSIS — N186 End stage renal disease: Secondary | ICD-10-CM | POA: Diagnosis not present

## 2021-04-30 DIAGNOSIS — Z23 Encounter for immunization: Secondary | ICD-10-CM | POA: Diagnosis not present

## 2021-04-30 DIAGNOSIS — D631 Anemia in chronic kidney disease: Secondary | ICD-10-CM | POA: Diagnosis not present

## 2021-04-30 DIAGNOSIS — E559 Vitamin D deficiency, unspecified: Secondary | ICD-10-CM | POA: Diagnosis not present

## 2021-04-30 DIAGNOSIS — D509 Iron deficiency anemia, unspecified: Secondary | ICD-10-CM | POA: Diagnosis not present

## 2021-05-01 DIAGNOSIS — D631 Anemia in chronic kidney disease: Secondary | ICD-10-CM | POA: Diagnosis not present

## 2021-05-01 DIAGNOSIS — E559 Vitamin D deficiency, unspecified: Secondary | ICD-10-CM | POA: Diagnosis not present

## 2021-05-01 DIAGNOSIS — D509 Iron deficiency anemia, unspecified: Secondary | ICD-10-CM | POA: Diagnosis not present

## 2021-05-01 DIAGNOSIS — Z23 Encounter for immunization: Secondary | ICD-10-CM | POA: Diagnosis not present

## 2021-05-01 DIAGNOSIS — N186 End stage renal disease: Secondary | ICD-10-CM | POA: Diagnosis not present

## 2021-05-01 DIAGNOSIS — Z992 Dependence on renal dialysis: Secondary | ICD-10-CM | POA: Diagnosis not present

## 2021-05-02 DIAGNOSIS — D509 Iron deficiency anemia, unspecified: Secondary | ICD-10-CM | POA: Diagnosis not present

## 2021-05-02 DIAGNOSIS — Z992 Dependence on renal dialysis: Secondary | ICD-10-CM | POA: Diagnosis not present

## 2021-05-02 DIAGNOSIS — N186 End stage renal disease: Secondary | ICD-10-CM | POA: Diagnosis not present

## 2021-05-02 DIAGNOSIS — E559 Vitamin D deficiency, unspecified: Secondary | ICD-10-CM | POA: Diagnosis not present

## 2021-05-02 DIAGNOSIS — Z23 Encounter for immunization: Secondary | ICD-10-CM | POA: Diagnosis not present

## 2021-05-02 DIAGNOSIS — D631 Anemia in chronic kidney disease: Secondary | ICD-10-CM | POA: Diagnosis not present

## 2021-05-03 ENCOUNTER — Encounter: Payer: Self-pay | Admitting: General Surgery

## 2021-05-03 DIAGNOSIS — N186 End stage renal disease: Secondary | ICD-10-CM | POA: Diagnosis not present

## 2021-05-03 DIAGNOSIS — D509 Iron deficiency anemia, unspecified: Secondary | ICD-10-CM | POA: Diagnosis not present

## 2021-05-03 DIAGNOSIS — D631 Anemia in chronic kidney disease: Secondary | ICD-10-CM | POA: Diagnosis not present

## 2021-05-03 DIAGNOSIS — E559 Vitamin D deficiency, unspecified: Secondary | ICD-10-CM | POA: Diagnosis not present

## 2021-05-03 DIAGNOSIS — Z992 Dependence on renal dialysis: Secondary | ICD-10-CM | POA: Diagnosis not present

## 2021-05-03 DIAGNOSIS — Z23 Encounter for immunization: Secondary | ICD-10-CM | POA: Diagnosis not present

## 2021-05-04 ENCOUNTER — Ambulatory Visit: Payer: Medicare Other | Admitting: Anesthesiology

## 2021-05-04 ENCOUNTER — Encounter
Admission: RE | Disposition: A | Discharge status: Home / Self Care | Payer: Self-pay | Source: Home / Self Care | Attending: General Surgery

## 2021-05-04 ENCOUNTER — Ambulatory Visit
Admission: RE | Admit: 2021-05-04 | Discharge: 2021-05-04 | Disposition: A | Discharge status: Home / Self Care | Payer: Medicare Other | Attending: General Surgery | Admitting: General Surgery

## 2021-05-04 ENCOUNTER — Encounter: Payer: Self-pay | Admitting: General Surgery

## 2021-05-04 DIAGNOSIS — M1990 Unspecified osteoarthritis, unspecified site: Secondary | ICD-10-CM | POA: Insufficient documentation

## 2021-05-04 DIAGNOSIS — D509 Iron deficiency anemia, unspecified: Secondary | ICD-10-CM | POA: Diagnosis not present

## 2021-05-04 DIAGNOSIS — I132 Hypertensive heart and chronic kidney disease with heart failure and with stage 5 chronic kidney disease, or end stage renal disease: Secondary | ICD-10-CM | POA: Diagnosis not present

## 2021-05-04 DIAGNOSIS — E669 Obesity, unspecified: Secondary | ICD-10-CM | POA: Insufficient documentation

## 2021-05-04 DIAGNOSIS — E785 Hyperlipidemia, unspecified: Secondary | ICD-10-CM | POA: Diagnosis not present

## 2021-05-04 DIAGNOSIS — K219 Gastro-esophageal reflux disease without esophagitis: Secondary | ICD-10-CM | POA: Diagnosis not present

## 2021-05-04 DIAGNOSIS — N186 End stage renal disease: Secondary | ICD-10-CM | POA: Diagnosis not present

## 2021-05-04 DIAGNOSIS — Z951 Presence of aortocoronary bypass graft: Secondary | ICD-10-CM | POA: Insufficient documentation

## 2021-05-04 DIAGNOSIS — K449 Diaphragmatic hernia without obstruction or gangrene: Secondary | ICD-10-CM | POA: Insufficient documentation

## 2021-05-04 DIAGNOSIS — Z992 Dependence on renal dialysis: Secondary | ICD-10-CM | POA: Diagnosis not present

## 2021-05-04 DIAGNOSIS — D649 Anemia, unspecified: Secondary | ICD-10-CM | POA: Insufficient documentation

## 2021-05-04 DIAGNOSIS — Z6829 Body mass index (BMI) 29.0-29.9, adult: Secondary | ICD-10-CM | POA: Diagnosis not present

## 2021-05-04 DIAGNOSIS — E559 Vitamin D deficiency, unspecified: Secondary | ICD-10-CM | POA: Diagnosis not present

## 2021-05-04 DIAGNOSIS — D126 Benign neoplasm of colon, unspecified: Secondary | ICD-10-CM | POA: Diagnosis not present

## 2021-05-04 DIAGNOSIS — D124 Benign neoplasm of descending colon: Secondary | ICD-10-CM | POA: Diagnosis not present

## 2021-05-04 DIAGNOSIS — E1122 Type 2 diabetes mellitus with diabetic chronic kidney disease: Secondary | ICD-10-CM | POA: Diagnosis not present

## 2021-05-04 DIAGNOSIS — K5730 Diverticulosis of large intestine without perforation or abscess without bleeding: Secondary | ICD-10-CM | POA: Insufficient documentation

## 2021-05-04 DIAGNOSIS — I2510 Atherosclerotic heart disease of native coronary artery without angina pectoris: Secondary | ICD-10-CM | POA: Diagnosis not present

## 2021-05-04 DIAGNOSIS — Z955 Presence of coronary angioplasty implant and graft: Secondary | ICD-10-CM | POA: Insufficient documentation

## 2021-05-04 DIAGNOSIS — Z23 Encounter for immunization: Secondary | ICD-10-CM | POA: Diagnosis not present

## 2021-05-04 DIAGNOSIS — Z87891 Personal history of nicotine dependence: Secondary | ICD-10-CM | POA: Diagnosis not present

## 2021-05-04 DIAGNOSIS — K635 Polyp of colon: Secondary | ICD-10-CM | POA: Diagnosis not present

## 2021-05-04 DIAGNOSIS — Z1211 Encounter for screening for malignant neoplasm of colon: Secondary | ICD-10-CM | POA: Diagnosis not present

## 2021-05-04 DIAGNOSIS — I509 Heart failure, unspecified: Secondary | ICD-10-CM | POA: Insufficient documentation

## 2021-05-04 DIAGNOSIS — I350 Nonrheumatic aortic (valve) stenosis: Secondary | ICD-10-CM | POA: Insufficient documentation

## 2021-05-04 DIAGNOSIS — D631 Anemia in chronic kidney disease: Secondary | ICD-10-CM | POA: Diagnosis not present

## 2021-05-04 HISTORY — PX: COLONOSCOPY WITH PROPOFOL: SHX5780

## 2021-05-04 LAB — GLUCOSE, CAPILLARY: Glucose-Capillary: 86 mg/dL (ref 70–99)

## 2021-05-04 SURGERY — COLONOSCOPY WITH PROPOFOL
Anesthesia: General

## 2021-05-04 MED ORDER — LIDOCAINE HCL (PF) 2 % IJ SOLN
INTRAMUSCULAR | Status: AC
  Filled 2021-05-04: qty 5

## 2021-05-04 MED ORDER — PROPOFOL 10 MG/ML IV BOLUS
INTRAVENOUS | Status: DC | PRN
Start: 2021-05-04 — End: 2021-05-04
  Administered 2021-05-04: 80 mg via INTRAVENOUS

## 2021-05-04 MED ORDER — SODIUM CHLORIDE 0.9 % IV SOLN
INTRAVENOUS | Status: DC
  Administered 2021-05-04: 1000 mL via INTRAVENOUS

## 2021-05-04 MED ORDER — PROPOFOL 500 MG/50ML IV EMUL
INTRAVENOUS | Status: AC
  Filled 2021-05-04: qty 50

## 2021-05-04 MED ORDER — LIDOCAINE HCL (CARDIAC) PF 100 MG/5ML IV SOSY
PREFILLED_SYRINGE | INTRAVENOUS | Status: DC | PRN
  Administered 2021-05-04: 40 mg via INTRAVENOUS

## 2021-05-04 MED ORDER — CEFAZOLIN SODIUM-DEXTROSE 2-4 GM/100ML-% IV SOLN
INTRAVENOUS | Status: AC
  Filled 2021-05-04: qty 100

## 2021-05-04 MED ORDER — PROPOFOL 500 MG/50ML IV EMUL
INTRAVENOUS | Status: DC | PRN
  Administered 2021-05-04: 150 ug/kg/min via INTRAVENOUS

## 2021-05-04 MED ORDER — SODIUM CHLORIDE 0.9 % IV SOLN
2.0000 g | Freq: Once | INTRAVENOUS | Status: AC
  Administered 2021-05-04: 2 g via INTRAVENOUS
  Filled 2021-05-04: qty 2

## 2021-05-04 NOTE — Op Note (Signed)
[facility] Gastroenterology Patient Name: Roger Knight Procedure Date: 05/04/2021 7:17 AM MRN: [id number] Account #: [id number] Date of Birth: [birth year] Admit Type: Outpatient Age: 71 Room: [facility] ENDO ROOM 1 Gender: Male Note Status: Finalized Instrument Name: Peds Colonoscope [id number] Colonoscope [id number] Procedure:             Colonoscopy Indications:           Screening for colorectal malignant neoplasm Providers:             Robert Bellow, MD Referring MD:          Adrian Prows (Referring MD) Medicines:             Propofol per Anesthesia Complications:         No immediate complications. Procedure:             Pre-Anesthesia Assessment:                        - Prior to the procedure, a History and Physical was                         performed, and patient medications, allergies and                         sensitivities were reviewed. The patient's tolerance                         of previous anesthesia was reviewed.                        - The risks and benefits of the procedure and the                         sedation options and risks were discussed with the                         patient. All questions were answered and informed                         consent was obtained.                        After obtaining informed consent, the colonoscope was                         passed under direct vision. Throughout the procedure,                         the patient's blood pressure, pulse, and oxygen                         saturations were monitored continuously. The                         Colonoscope was introduced through the anus and                         advanced to the the terminal ileum. The Colonoscope  was introduced through the anus and advanced to the                         the terminal ileum. The colonoscopy was performed                         without difficulty. The patient tolerated the                          procedure well. The quality of the bowel preparation                         was adequate to identify polyps. Findings:      Many small-mouthed diverticula were found in the sigmoid colon.      A 5 mm polyp was found in the proximal descending colon. The polyp was       sessile. Biopsies were taken with a cold forceps for histology.      The retroflexed view of the distal rectum and anal verge was normal and       showed no anal or rectal abnormalities. Impression:            - Diverticulosis in the sigmoid colon.                        - One 5 mm polyp in the proximal descending colon.                         Biopsied.                        - The distal rectum and anal verge are normal on                         retroflexion view. Recommendation:        - Telephone endoscopist for pathology results in 1                         week. Procedure Code(s):     --- Professional ---                        [phone], Colonoscopy, flexible; with biopsy, single or                         multiple Diagnosis Code(s):     --- Professional ---                        Z12.11, Encounter for screening for malignant neoplasm                         of colon                        K57.30, Diverticulosis of large intestine without                         perforation or abscess without bleeding                        K63.5, Polyp  of colon CPT copyright 2019 American Medical Association. All rights reserved. The codes documented in this report are preliminary and upon coder review may  be revised to meet current compliance requirements. Robert Bellow, MD 05/04/2021 8:45:53 AM This report has been signed electronically. Number of Addenda: 0 Note Initiated On: 05/04/2021 7:17 AM Scope Withdrawal Time: 0 hours 16 minutes 30 seconds  Total Procedure Duration: 0 hours 21 minutes 1 second  Estimated Blood Loss:  Estimated blood loss: none. Estimated blood loss was                          minimal.      [facility]

## 2021-05-04 NOTE — Anesthesia Postprocedure Evaluation (Signed)
Anesthesia Post Note  Patient: Roger Knight  Procedure(s) Performed: COLONOSCOPY WITH PROPOFOL  Patient location during evaluation: PACU Anesthesia Type: General Level of consciousness: awake and alert, oriented and patient cooperative Pain management: pain level controlled Vital Signs Assessment: post-procedure vital signs reviewed and stable Respiratory status: spontaneous breathing, nonlabored ventilation and respiratory function stable Cardiovascular status: blood pressure returned to baseline and stable Postop Assessment: adequate PO intake Anesthetic complications: no   No notable events documented.   Last Vitals:  Vitals:   05/04/21 0858 05/04/21 0908  BP: 104/67 (!) 121/56  Pulse: (!) 57 (!) 57  Resp: 14 13  Temp:    SpO2: 98% 97%    Last Pain:  Vitals:   05/04/21 0908  TempSrc:   PainSc: 0-No pain                 Darrin Nipper

## 2021-05-04 NOTE — Transfer of Care (Signed)
Immediate Anesthesia Transfer of Care Note  Patient: Roger Knight  Procedure(s) Performed: Procedure(s) with comments: COLONOSCOPY WITH PROPOFOL (N/A) - DM  Patient Location: PACU and Endoscopy Unit  Anesthesia Type:General  Level of Consciousness: sedated  Airway & Oxygen Therapy: Patient Spontanous Breathing and Patient connected to nasal cannula oxygen  Post-op Assessment: Report given to RN and Post -op Vital signs reviewed and stable  Post vital signs: Reviewed and stable  Last Vitals:  Vitals:   05/04/21 0658 05/04/21 0848  BP: (!) 143/88 (!) 81/51  Pulse: 62 (!) 58  Resp:  18  Temp: (!) 35.7 C (!) 35.6 C  SpO2:  51%    Complications: No apparent anesthesia complications

## 2021-05-04 NOTE — H&P (Signed)
Roger Knight [id number] [birth year]     HPI: Patient is a candidate for renal transplant, and a colonoscopy is requirement.  Tolerated prep well. Had been requested to use Amoxacillin prior to the procedure to minimize bacteremia during the procedure. He reports he took it 2 days ago in place of the ducolax.   Medications Prior to Admission  Medication Sig Dispense Refill Last Dose   amLODipine (NORVASC) 5 MG tablet Take 5 mg by mouth daily.    05/03/2021   aspirin 81 MG tablet Take 81 mg by mouth daily.   05/03/2021   atorvastatin (LIPITOR) 80 MG tablet Take 80 mg by mouth daily.   05/03/2021   calcitRIOL (ROCALTROL) 0.25 MCG capsule Take 0.25 mcg by mouth daily.    05/03/2021   Calcium Polycarbophil (FIBER) 625 MG TABS Take 2 capsules by mouth in the morning and at bedtime.   Past Week   cetirizine (ZYRTEC) 10 MG tablet Take 10 mg by mouth daily.   Past Week   ezetimibe (ZETIA) 10 MG tablet Take 10 mg by mouth daily.   05/03/2021   gabapentin (NEURONTIN) 300 MG capsule Take 300 mg by mouth at bedtime.   05/03/2021   insulin aspart (NOVOLOG) 100 UNIT/ML injection Inject 20 Units into the skin 3 (three) times daily.    Past Week   Insulin Pen Needle 32G X 4 MM MISC USE WITH INJECTIONS 4 TIMES DAILY   Past Week   metoprolol (TOPROL-XL) 200 MG 24 hr tablet Take 200 mg by mouth daily.   05/03/2021   Multiple Vitamins-Minerals (MULTIVITAMIN WITH MINERALS) tablet Take 1 tablet by mouth daily.   05/03/2021   pantoprazole (PROTONIX) 40 MG tablet Take 40 mg by mouth 2 (two) times daily before a meal.    05/03/2021   spironolactone (ALDACTONE) 25 MG tablet Take 25 mg by mouth 2 (two) times daily.   05/03/2021   TRESIBA FLEXTOUCH 200 UNIT/ML SOPN Inject 90 Units into the skin at bedtime.   05/03/2021   triamcinolone cream (KENALOG) 0.1 % Apply 1 application topically 2 (two) times daily as needed (irritation).   05/03/2021   valsartan-hydrochlorothiazide (DIOVAN-HCT) 320-25 MG per tablet Take 1 tablet by  mouth daily.   05/03/2021   sildenafil (VIAGRA) 100 MG tablet Take 100 mg by mouth as needed for erectile dysfunction. Reported on 06/14/2015      No Known Allergies Past Medical History:  Diagnosis Date   Accelerating angina ([place]) 02/11/2014   Anemia    Arthritis    Carcinoma ([place]) 2012    skin cancer on neck [facility]   Chronic kidney disease 2016   Diabetes mellitus without complication (HCC)    GERD (gastroesophageal reflux disease)    Hemorrhoids 2016-17   Hernia    Hyperlipidemia    Hypertension    Skin cancer    Past Surgical History:  Procedure Laterality Date   COLONOSCOPY  April 2014   COLONOSCOPY N/A 05/13/2015   Procedure: COLONOSCOPY;  Surgeon: Josefine Class, MD;  Location: [facility] ENDOSCOPY;  Service: Endoscopy;  Laterality: N/A;   CORONARY ARTERY BYPASS GRAFT  1996   [facility]   CORONARY STENT PLACEMENT  2011   [facility]   ESOPHAGOGASTRODUODENOSCOPY (EGD) WITH PROPOFOL N/A 05/13/2015   Procedure: ESOPHAGOGASTRODUODENOSCOPY (EGD) WITH PROPOFOL;  Surgeon: Josefine Class, MD;  Location: [facility] ENDOSCOPY;  Service: Endoscopy;  Laterality: N/A;   LEFT HEART CATH AND CORS/GRAFTS ANGIOGRAPHY N/A 01/24/2021   Procedure: LEFT HEART CATH AND CORS/GRAFTS ANGIOGRAPHY;  Surgeon: Corky Sox,  Kathrin Ruddy, MD;  Location: [place] CV LAB;  Service: Cardiovascular;  Laterality: N/A;   UPPER GI ENDOSCOPY  2014   Social History   Socioeconomic History   Marital status: Married    Spouse name: Not on file   Number of children: Not on file   Years of education: Not on file   Highest education level: Not on file  Occupational History   Not on file  Tobacco Use   Smoking status: Former    Types: Cigarettes    Quit date: 04/24/2005    Years since quitting: 16.0   Smokeless tobacco: Former    Types: Nurse, children's Use: Never used  Substance and Sexual Activity   Alcohol use: No   Drug use: No   Sexual activity: Yes  Other Topics Concern   Not on file  Social History  Narrative   Not on file   Social Determinants of Health   Financial Resource Strain: Not on file  Food Insecurity: Not on file  Transportation Needs: Not on file  Physical Activity: Not on file  Stress: Not on file  Social Connections: Not on file  Intimate Partner Violence: Not on file   Social History   Social History Narrative   Not on file     ROS: Negative.     PE: HEENT: Negative. Lungs: Clear. Cardio: RR.   Assessment/Plan:  Proceed with planned endoscopy.   Forest Gleason [facility] 05/04/2021

## 2021-05-04 NOTE — Anesthesia Procedure Notes (Signed)
Date/Time: 05/04/2021 8:17 AM Performed by: Doreen Salvage, CRNA Pre-anesthesia Checklist: Patient identified, Emergency Drugs available, Suction available and Patient being monitored Patient Re-evaluated:Patient Re-evaluated prior to induction Oxygen Delivery Method: Nasal cannula Induction Type: IV induction Dental Injury: Teeth and Oropharynx as per pre-operative assessment  Comments: Nasal cannula with etCO2 monitoring

## 2021-05-04 NOTE — Anesthesia Preprocedure Evaluation (Addendum)
Anesthesia Evaluation  Patient identified by MRN, date of birth, ID band Patient awake    Reviewed: Allergy & Precautions, NPO status , Patient's Chart, lab work & pertinent test results  History of Anesthesia Complications Negative for: history of anesthetic complications  Airway Mallampati: III   Neck ROM: Full    Dental  (+) Partial Upper   Pulmonary former smoker (quit 2007),    Pulmonary exam normal breath sounds clear to auscultation       Cardiovascular hypertension, + CAD (s/p stents and CABG) and +CHF (EF 45%)  Normal cardiovascular exam+ Valvular Problems/Murmurs (mild AS)  Rhythm:Regular Rate:Normal  Echo 11/16/20:  MODERATE LV SYSTOLIC DYSFUNCTION   NORMAL RIGHT VENTRICULAR SYSTOLIC FUNCTION  MODERATE VALVULAR STENOSIS   AVA(VTI)=.75cm^2  MILD MR, TR  EF 30%  Morphology: MILDLY THICKENED  Mitral: MILD MR  Tricuspid: MILD TR  AVS: MILD AS  Closest EF: 45% (Estimated)   NM stress 12/2018- Inferior scar. EF 32%  LHC 01/24/21- Conclusion:  Severe 3 vessel coronary artery disease. CTO RCA, occluded mid LAD (patent stent in LMCA to LAD), 70% ostial Lcx, 70% mid OM1.  Occluded SVG to RCA and OM. Patent LIMA to LAD.  Mild aortic stenosis.  NormalLVEDP     Neuro/Psych negative neurological ROS     GI/Hepatic hiatal hernia, GERD  ,  Endo/Other  diabetes, Type 2Obesity   Renal/GU Renal disease (stage IV CKD)     Musculoskeletal  (+) Arthritis ,   Abdominal   Peds  Hematology  (+) Blood dyscrasia, anemia ,   Anesthesia Other Findings Reviewed 02/01/21 cardiology note  Reproductive/Obstetrics                            Anesthesia Physical Anesthesia Plan  ASA: 4  Anesthesia Plan: General   Post-op Pain Management:    Induction: Intravenous  PONV Risk Score and Plan: 2 and Propofol infusion, TIVA and Treatment may vary due to age or medical condition  Airway Management  Planned: Natural Airway  Additional Equipment:   Intra-op Plan:   Post-operative Plan:   Informed Consent: I have reviewed the patients History and Physical, chart, labs and discussed the procedure including the risks, benefits and alternatives for the proposed anesthesia with the patient or authorized representative who has indicated his/her understanding and acceptance.       Plan Discussed with: CRNA  Anesthesia Plan Comments: (LMA/GETA backup discussed.  Patient consented for risks of anesthesia including but not limited to:  - adverse reactions to medications - damage to eyes, teeth, lips or other oral mucosa - nerve damage due to positioning  - sore throat or hoarseness - damage to heart, brain, nerves, lungs, other parts of body or loss of life  Informed patient about role of CRNA in peri- and intra-operative care.  Patient voiced understanding.)        Anesthesia Quick Evaluation

## 2021-05-05 ENCOUNTER — Encounter: Payer: Self-pay | Admitting: General Surgery

## 2021-05-05 DIAGNOSIS — Z992 Dependence on renal dialysis: Secondary | ICD-10-CM | POA: Diagnosis not present

## 2021-05-05 DIAGNOSIS — D631 Anemia in chronic kidney disease: Secondary | ICD-10-CM | POA: Diagnosis not present

## 2021-05-05 DIAGNOSIS — E559 Vitamin D deficiency, unspecified: Secondary | ICD-10-CM | POA: Diagnosis not present

## 2021-05-05 DIAGNOSIS — Z23 Encounter for immunization: Secondary | ICD-10-CM | POA: Diagnosis not present

## 2021-05-05 DIAGNOSIS — D509 Iron deficiency anemia, unspecified: Secondary | ICD-10-CM | POA: Diagnosis not present

## 2021-05-05 DIAGNOSIS — N186 End stage renal disease: Secondary | ICD-10-CM | POA: Diagnosis not present

## 2021-05-05 LAB — SURGICAL PATHOLOGY

## 2021-05-06 DIAGNOSIS — Z992 Dependence on renal dialysis: Secondary | ICD-10-CM | POA: Diagnosis not present

## 2021-05-06 DIAGNOSIS — D631 Anemia in chronic kidney disease: Secondary | ICD-10-CM | POA: Diagnosis not present

## 2021-05-06 DIAGNOSIS — N186 End stage renal disease: Secondary | ICD-10-CM | POA: Diagnosis not present

## 2021-05-06 DIAGNOSIS — D509 Iron deficiency anemia, unspecified: Secondary | ICD-10-CM | POA: Diagnosis not present

## 2021-05-06 DIAGNOSIS — Z23 Encounter for immunization: Secondary | ICD-10-CM | POA: Diagnosis not present

## 2021-05-06 DIAGNOSIS — E559 Vitamin D deficiency, unspecified: Secondary | ICD-10-CM | POA: Diagnosis not present

## 2021-05-07 DIAGNOSIS — D631 Anemia in chronic kidney disease: Secondary | ICD-10-CM | POA: Diagnosis not present

## 2021-05-07 DIAGNOSIS — Z23 Encounter for immunization: Secondary | ICD-10-CM | POA: Diagnosis not present

## 2021-05-07 DIAGNOSIS — Z992 Dependence on renal dialysis: Secondary | ICD-10-CM | POA: Diagnosis not present

## 2021-05-07 DIAGNOSIS — D509 Iron deficiency anemia, unspecified: Secondary | ICD-10-CM | POA: Diagnosis not present

## 2021-05-07 DIAGNOSIS — N186 End stage renal disease: Secondary | ICD-10-CM | POA: Diagnosis not present

## 2021-05-07 DIAGNOSIS — E559 Vitamin D deficiency, unspecified: Secondary | ICD-10-CM | POA: Diagnosis not present

## 2021-05-08 DIAGNOSIS — Z992 Dependence on renal dialysis: Secondary | ICD-10-CM | POA: Diagnosis not present

## 2021-05-08 DIAGNOSIS — Z23 Encounter for immunization: Secondary | ICD-10-CM | POA: Diagnosis not present

## 2021-05-08 DIAGNOSIS — E559 Vitamin D deficiency, unspecified: Secondary | ICD-10-CM | POA: Diagnosis not present

## 2021-05-08 DIAGNOSIS — N186 End stage renal disease: Secondary | ICD-10-CM | POA: Diagnosis not present

## 2021-05-08 DIAGNOSIS — D631 Anemia in chronic kidney disease: Secondary | ICD-10-CM | POA: Diagnosis not present

## 2021-05-08 DIAGNOSIS — D509 Iron deficiency anemia, unspecified: Secondary | ICD-10-CM | POA: Diagnosis not present

## 2021-05-09 DIAGNOSIS — Z23 Encounter for immunization: Secondary | ICD-10-CM | POA: Diagnosis not present

## 2021-05-09 DIAGNOSIS — N186 End stage renal disease: Secondary | ICD-10-CM | POA: Diagnosis not present

## 2021-05-09 DIAGNOSIS — E559 Vitamin D deficiency, unspecified: Secondary | ICD-10-CM | POA: Diagnosis not present

## 2021-05-09 DIAGNOSIS — D509 Iron deficiency anemia, unspecified: Secondary | ICD-10-CM | POA: Diagnosis not present

## 2021-05-09 DIAGNOSIS — Z992 Dependence on renal dialysis: Secondary | ICD-10-CM | POA: Diagnosis not present

## 2021-05-09 DIAGNOSIS — D631 Anemia in chronic kidney disease: Secondary | ICD-10-CM | POA: Diagnosis not present

## 2021-05-10 DIAGNOSIS — N186 End stage renal disease: Secondary | ICD-10-CM | POA: Diagnosis not present

## 2021-05-10 DIAGNOSIS — E559 Vitamin D deficiency, unspecified: Secondary | ICD-10-CM | POA: Diagnosis not present

## 2021-05-10 DIAGNOSIS — Z23 Encounter for immunization: Secondary | ICD-10-CM | POA: Diagnosis not present

## 2021-05-10 DIAGNOSIS — D631 Anemia in chronic kidney disease: Secondary | ICD-10-CM | POA: Diagnosis not present

## 2021-05-10 DIAGNOSIS — Z992 Dependence on renal dialysis: Secondary | ICD-10-CM | POA: Diagnosis not present

## 2021-05-10 DIAGNOSIS — D509 Iron deficiency anemia, unspecified: Secondary | ICD-10-CM | POA: Diagnosis not present

## 2021-05-11 DIAGNOSIS — Z23 Encounter for immunization: Secondary | ICD-10-CM | POA: Diagnosis not present

## 2021-05-11 DIAGNOSIS — Z992 Dependence on renal dialysis: Secondary | ICD-10-CM | POA: Diagnosis not present

## 2021-05-11 DIAGNOSIS — D509 Iron deficiency anemia, unspecified: Secondary | ICD-10-CM | POA: Diagnosis not present

## 2021-05-11 DIAGNOSIS — D631 Anemia in chronic kidney disease: Secondary | ICD-10-CM | POA: Diagnosis not present

## 2021-05-11 DIAGNOSIS — N186 End stage renal disease: Secondary | ICD-10-CM | POA: Diagnosis not present

## 2021-05-11 DIAGNOSIS — E559 Vitamin D deficiency, unspecified: Secondary | ICD-10-CM | POA: Diagnosis not present

## 2021-05-12 DIAGNOSIS — Z23 Encounter for immunization: Secondary | ICD-10-CM | POA: Diagnosis not present

## 2021-05-12 DIAGNOSIS — D631 Anemia in chronic kidney disease: Secondary | ICD-10-CM | POA: Diagnosis not present

## 2021-05-12 DIAGNOSIS — Z992 Dependence on renal dialysis: Secondary | ICD-10-CM | POA: Diagnosis not present

## 2021-05-12 DIAGNOSIS — N186 End stage renal disease: Secondary | ICD-10-CM | POA: Diagnosis not present

## 2021-05-12 DIAGNOSIS — E559 Vitamin D deficiency, unspecified: Secondary | ICD-10-CM | POA: Diagnosis not present

## 2021-05-12 DIAGNOSIS — D509 Iron deficiency anemia, unspecified: Secondary | ICD-10-CM | POA: Diagnosis not present

## 2021-05-13 DIAGNOSIS — E559 Vitamin D deficiency, unspecified: Secondary | ICD-10-CM | POA: Diagnosis not present

## 2021-05-13 DIAGNOSIS — Z23 Encounter for immunization: Secondary | ICD-10-CM | POA: Diagnosis not present

## 2021-05-13 DIAGNOSIS — Z992 Dependence on renal dialysis: Secondary | ICD-10-CM | POA: Diagnosis not present

## 2021-05-13 DIAGNOSIS — D631 Anemia in chronic kidney disease: Secondary | ICD-10-CM | POA: Diagnosis not present

## 2021-05-13 DIAGNOSIS — D509 Iron deficiency anemia, unspecified: Secondary | ICD-10-CM | POA: Diagnosis not present

## 2021-05-13 DIAGNOSIS — N186 End stage renal disease: Secondary | ICD-10-CM | POA: Diagnosis not present

## 2021-05-14 DIAGNOSIS — Z23 Encounter for immunization: Secondary | ICD-10-CM | POA: Diagnosis not present

## 2021-05-14 DIAGNOSIS — D631 Anemia in chronic kidney disease: Secondary | ICD-10-CM | POA: Diagnosis not present

## 2021-05-14 DIAGNOSIS — N186 End stage renal disease: Secondary | ICD-10-CM | POA: Diagnosis not present

## 2021-05-14 DIAGNOSIS — Z992 Dependence on renal dialysis: Secondary | ICD-10-CM | POA: Diagnosis not present

## 2021-05-14 DIAGNOSIS — D509 Iron deficiency anemia, unspecified: Secondary | ICD-10-CM | POA: Diagnosis not present

## 2021-05-14 DIAGNOSIS — E559 Vitamin D deficiency, unspecified: Secondary | ICD-10-CM | POA: Diagnosis not present

## 2021-05-15 DIAGNOSIS — N186 End stage renal disease: Secondary | ICD-10-CM | POA: Diagnosis not present

## 2021-05-15 DIAGNOSIS — Z992 Dependence on renal dialysis: Secondary | ICD-10-CM | POA: Diagnosis not present

## 2021-05-15 DIAGNOSIS — E559 Vitamin D deficiency, unspecified: Secondary | ICD-10-CM | POA: Diagnosis not present

## 2021-05-15 DIAGNOSIS — D631 Anemia in chronic kidney disease: Secondary | ICD-10-CM | POA: Diagnosis not present

## 2021-05-15 DIAGNOSIS — D509 Iron deficiency anemia, unspecified: Secondary | ICD-10-CM | POA: Diagnosis not present

## 2021-05-15 DIAGNOSIS — Z23 Encounter for immunization: Secondary | ICD-10-CM | POA: Diagnosis not present

## 2021-05-16 DIAGNOSIS — E559 Vitamin D deficiency, unspecified: Secondary | ICD-10-CM | POA: Diagnosis not present

## 2021-05-16 DIAGNOSIS — D509 Iron deficiency anemia, unspecified: Secondary | ICD-10-CM | POA: Diagnosis not present

## 2021-05-16 DIAGNOSIS — Z23 Encounter for immunization: Secondary | ICD-10-CM | POA: Diagnosis not present

## 2021-05-16 DIAGNOSIS — N186 End stage renal disease: Secondary | ICD-10-CM | POA: Diagnosis not present

## 2021-05-16 DIAGNOSIS — Z992 Dependence on renal dialysis: Secondary | ICD-10-CM | POA: Diagnosis not present

## 2021-05-16 DIAGNOSIS — D631 Anemia in chronic kidney disease: Secondary | ICD-10-CM | POA: Diagnosis not present

## 2021-05-17 DIAGNOSIS — N186 End stage renal disease: Secondary | ICD-10-CM | POA: Diagnosis not present

## 2021-05-17 DIAGNOSIS — Z23 Encounter for immunization: Secondary | ICD-10-CM | POA: Diagnosis not present

## 2021-05-17 DIAGNOSIS — Z992 Dependence on renal dialysis: Secondary | ICD-10-CM | POA: Diagnosis not present

## 2021-05-17 DIAGNOSIS — D631 Anemia in chronic kidney disease: Secondary | ICD-10-CM | POA: Diagnosis not present

## 2021-05-17 DIAGNOSIS — D509 Iron deficiency anemia, unspecified: Secondary | ICD-10-CM | POA: Diagnosis not present

## 2021-05-17 DIAGNOSIS — E559 Vitamin D deficiency, unspecified: Secondary | ICD-10-CM | POA: Diagnosis not present

## 2021-05-18 DIAGNOSIS — N186 End stage renal disease: Secondary | ICD-10-CM | POA: Diagnosis not present

## 2021-05-18 DIAGNOSIS — E559 Vitamin D deficiency, unspecified: Secondary | ICD-10-CM | POA: Diagnosis not present

## 2021-05-18 DIAGNOSIS — D631 Anemia in chronic kidney disease: Secondary | ICD-10-CM | POA: Diagnosis not present

## 2021-05-18 DIAGNOSIS — D509 Iron deficiency anemia, unspecified: Secondary | ICD-10-CM | POA: Diagnosis not present

## 2021-05-18 DIAGNOSIS — Z23 Encounter for immunization: Secondary | ICD-10-CM | POA: Diagnosis not present

## 2021-05-18 DIAGNOSIS — Z992 Dependence on renal dialysis: Secondary | ICD-10-CM | POA: Diagnosis not present

## 2021-05-19 ENCOUNTER — Encounter: Payer: Self-pay | Admitting: Emergency Medicine

## 2021-05-19 ENCOUNTER — Other Ambulatory Visit: Payer: Self-pay

## 2021-05-19 ENCOUNTER — Emergency Department
Admission: EM | Admit: 2021-05-19 | Discharge: 2021-05-19 | Disposition: A | Discharge status: Home / Self Care | Payer: Medicare Other | Attending: Emergency Medicine | Admitting: Emergency Medicine

## 2021-05-19 DIAGNOSIS — I129 Hypertensive chronic kidney disease with stage 1 through stage 4 chronic kidney disease, or unspecified chronic kidney disease: Secondary | ICD-10-CM | POA: Diagnosis not present

## 2021-05-19 DIAGNOSIS — D631 Anemia in chronic kidney disease: Secondary | ICD-10-CM | POA: Insufficient documentation

## 2021-05-19 DIAGNOSIS — Z85828 Personal history of other malignant neoplasm of skin: Secondary | ICD-10-CM | POA: Insufficient documentation

## 2021-05-19 DIAGNOSIS — E1122 Type 2 diabetes mellitus with diabetic chronic kidney disease: Secondary | ICD-10-CM | POA: Insufficient documentation

## 2021-05-19 DIAGNOSIS — D509 Iron deficiency anemia, unspecified: Secondary | ICD-10-CM | POA: Diagnosis not present

## 2021-05-19 DIAGNOSIS — Z992 Dependence on renal dialysis: Secondary | ICD-10-CM | POA: Diagnosis not present

## 2021-05-19 DIAGNOSIS — Z20822 Contact with and (suspected) exposure to covid-19: Secondary | ICD-10-CM | POA: Insufficient documentation

## 2021-05-19 DIAGNOSIS — I2510 Atherosclerotic heart disease of native coronary artery without angina pectoris: Secondary | ICD-10-CM | POA: Diagnosis not present

## 2021-05-19 DIAGNOSIS — R58 Hemorrhage, not elsewhere classified: Secondary | ICD-10-CM | POA: Diagnosis not present

## 2021-05-19 DIAGNOSIS — N186 End stage renal disease: Secondary | ICD-10-CM | POA: Diagnosis not present

## 2021-05-19 DIAGNOSIS — R55 Syncope and collapse: Secondary | ICD-10-CM | POA: Insufficient documentation

## 2021-05-19 DIAGNOSIS — E559 Vitamin D deficiency, unspecified: Secondary | ICD-10-CM | POA: Diagnosis not present

## 2021-05-19 DIAGNOSIS — Z794 Long term (current) use of insulin: Secondary | ICD-10-CM | POA: Insufficient documentation

## 2021-05-19 DIAGNOSIS — Z23 Encounter for immunization: Secondary | ICD-10-CM | POA: Diagnosis not present

## 2021-05-19 DIAGNOSIS — R1111 Vomiting without nausea: Secondary | ICD-10-CM | POA: Diagnosis not present

## 2021-05-19 DIAGNOSIS — N183 Chronic kidney disease, stage 3 unspecified: Secondary | ICD-10-CM | POA: Insufficient documentation

## 2021-05-19 LAB — RESP PANEL BY RT-PCR (FLU A&B, COVID) ARPGX2
Influenza A by PCR: NEGATIVE
Influenza B by PCR: NEGATIVE
SARS Coronavirus 2 by RT PCR: NEGATIVE

## 2021-05-19 LAB — BASIC METABOLIC PANEL
Anion gap: 8 (ref 5–15)
BUN: 39 mg/dL — ABNORMAL HIGH (ref 8–23)
CO2: 26 mmol/L (ref 22–32)
Calcium: 9.2 mg/dL (ref 8.9–10.3)
Chloride: 102 mmol/L (ref 98–111)
Creatinine, Ser: 4.33 mg/dL — ABNORMAL HIGH (ref 0.61–1.24)
GFR, Estimated: 14 mL/min — ABNORMAL LOW (ref >60)
Glucose, Bld: 139 mg/dL — ABNORMAL HIGH (ref 70–99)
Potassium: 3.8 mmol/L (ref 3.5–5.1)
Sodium: 136 mmol/L (ref 135–145)

## 2021-05-19 LAB — CBC
HCT: 29.8 % — ABNORMAL LOW (ref 39.0–52.0)
Hemoglobin: 9.9 g/dL — ABNORMAL LOW (ref 13.0–17.0)
MCH: 30.7 pg (ref 26.0–34.0)
MCHC: 33.2 g/dL (ref 30.0–36.0)
MCV: 92.5 fL (ref 80.0–100.0)
Platelets: 199 10*3/uL (ref 150–400)
RBC: 3.22 MIL/uL — ABNORMAL LOW (ref 4.22–5.81)
RDW: 14.6 % (ref 11.5–15.5)
WBC: 5.9 10*3/uL (ref 4.0–10.5)
nRBC: 0 % (ref 0.0–0.2)

## 2021-05-19 LAB — HEPATIC FUNCTION PANEL
ALT: 22 U/L (ref 0–44)
AST: 19 U/L (ref 15–41)
Albumin: 2.3 g/dL — ABNORMAL LOW (ref 3.5–5.0)
Alkaline Phosphatase: 68 U/L (ref 38–126)
Bilirubin, Direct: <0.1 mg/dL (ref 0.0–0.2)
Total Bilirubin: 0.5 mg/dL (ref 0.3–1.2)
Total Protein: 5.4 g/dL — ABNORMAL LOW (ref 6.5–8.1)

## 2021-05-19 LAB — TROPONIN I (HIGH SENSITIVITY)
Troponin I (High Sensitivity): 22 ng/L — ABNORMAL HIGH (ref <18)
Troponin I (High Sensitivity): 23 ng/L — ABNORMAL HIGH (ref <18)

## 2021-05-19 LAB — CBG MONITORING, ED: Glucose-Capillary: 134 mg/dL — ABNORMAL HIGH (ref 70–99)

## 2021-05-19 MED ORDER — SODIUM CHLORIDE 0.9 % IV BOLUS
500.0000 mL | Freq: Once | INTRAVENOUS | Status: AC
  Administered 2021-05-19: 500 mL via INTRAVENOUS

## 2021-05-19 NOTE — ED Triage Notes (Signed)
Per EMS pt is a home dialysis pt was on hour 4/4 when he had a syncopal episode with emesis afterward per wife; CBG 244; hypotensive upon EMS arrival; 500 bolus en route, report of rectal bleeding with hx of hemorrhoids

## 2021-05-19 NOTE — ED Provider Notes (Signed)
[facility] Provider Note    Event Date/Time   First MD Initiated Contact with Patient 05/19/21 [phone]     (approximate)   History   Loss of Consciousness   HPI  Roger Knight is a 71 y.o. male   with past medical history of coronary disease status post CABG, CKD on peritoneal dialysis, history of bleeding hemorrhoids, hypertension who presents after syncopal episode.  Patient tells me that today he ate a spicy meal and then afterward was having some gastrointestinal discomfort including acid reflux.  Tonight he woke up several times and had bright red blood per rectum.  Says he has had this in the past due to bleeding hemorrhoids.  Says that there was stool that was mixed in with the blood.  During one of the episodes after he was leaving the bathroom he felt lightheaded and subsequently had a syncopal episode.  His wife who provides independent history says that he had complained that his bilateral lower extremities were feeling weak and he subsequently slid down and lost consciousness but did not hit his head.  Denies any preceding chest pain shortness of breath or palpitations.  Has not had any chest pain throughout.  Has required transfusions in the past.  Apparently had a colonoscopy 2 weeks ago which was unremarkable.  Not anticoagulated.     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   Iron deficiency anemia 06/16/2016   Acute lower GI bleeding 06/01/2015   External hemorrhoid 05/18/2015   Internal hemorrhoids with complication  54/27/0623   GIB (gastrointestinal bleeding) 05/11/2015   Type II diabetes mellitus with neurological manifestations ([place]) 11/16/2014   Stage III chronic kidney disease ([place]) 10/27/2014   Coronary artery disease 10/27/2014   GERD (gastroesophageal reflux disease) 10/27/2014   Type 2 diabetes mellitus with peripheral neuropathy ([place]) 10/27/2014   Dyslipidemia 10/27/2014   PN (peripheral neuropathy) ([place]) 10/27/2014   Anemia 10/13/2014   Monoclonal gammopathy 10/13/2014   Long-term insulin use ([place]) 05/05/2014   Peripheral polyneuropathy ([place]) 05/05/2014   Proteinuria 05/05/2014   Benign essential hypertension 02/11/2014   Intermediate coronary syndrome ([place]) 02/11/2014   Arthritis pain of hand 12/17/2013   Lumbar radiculitis 12/17/2013   DDD (degenerative disc disease), lumbar 12/17/2013   Rectal bleeding 04/17/2013   Acute blood loss anemia 04/17/2013     Physical Exam  Triage Vital Signs: ED Triage Vitals  Enc Vitals Group     BP 05/19/21 0504 (!) 124/54     Pulse Rate 05/19/21 0504 62     Resp 05/19/21 0504 15     Temp 05/19/21 0504 98.2 F (36.8 C)     Temp Source 05/19/21 0504 Oral     SpO2 05/19/21 0504 100 %     Weight 05/19/21 0503 190 lb (86.2 kg)     Height 05/19/21 0503 5\' 7"  (1.702 m)     Head Circumference --      Peak Flow --  Pain Score 05/19/21 0503 0     Pain Loc --      Pain Edu? --      Excl. in [place]? --     Most recent vital signs: Vitals:   05/19/21 0504  BP: (!) 124/54  Pulse: 62  Resp: 15  Temp: 98.2 F (36.8 C)  SpO2: 100%     General: Awake, no distress.  CV:  Good peripheral perfusion.  Resp:  Normal effort.  Abd:  No distention.  Abdomen is soft and nontender throughout, dialysis catheter in place, no surrounding erythema, effluent is clear Neuro:             Awake, Alert, Oriented x 3  Other:  Brown stool on rectal exam   ED Results / Procedures / Treatments  Labs (all labs ordered are listed, but only abnormal results  are displayed) Labs Reviewed  BASIC METABOLIC PANEL - Abnormal; Notable for the following components:      Result Value   Glucose, Bld 139 (*)    BUN 39 (*)    Creatinine, Ser 4.33 (*)    GFR, Estimated 14 (*)    All other components within normal limits  CBC - Abnormal; Notable for the following components:   RBC 3.22 (*)    Hemoglobin 9.9 (*)    HCT 29.8 (*)    All other components within normal limits  HEPATIC FUNCTION PANEL - Abnormal; Notable for the following components:   Total Protein 5.4 (*)    Albumin 2.3 (*)    All other components within normal limits  CBG MONITORING, ED - Abnormal; Notable for the following components:   Glucose-Capillary 134 (*)    All other components within normal limits  TROPONIN I (HIGH SENSITIVITY) - Abnormal; Notable for the following components:   Troponin I (High Sensitivity) 22 (*)    All other components within normal limits  RESP PANEL BY RT-PCR (FLU A&B, COVID) ARPGX2  URINALYSIS, ROUTINE W REFLEX MICROSCOPIC  TYPE AND SCREEN  TROPONIN I (HIGH SENSITIVITY)     EKG  EKG interpreted by myself, normal sinus rhythm, normal axis, first-degree AV block, left bundle branch block, inverted T waves in the lateral leads, similar to prior   RADIOLOGY    PROCEDURES:  Critical Care performed: No  Procedures  The patient is on the cardiac monitor to evaluate for evidence of arrhythmia and/or significant heart rate changes.   MEDICATIONS ORDERED IN ED: Medications  sodium chloride 0.9 % bolus 500 mL (500 mLs Intravenous New Bag/Given 05/19/21 0701)     IMPRESSION / MDM / ASSESSMENT AND PLAN / ED COURSE  I reviewed the triage vital signs and the nursing notes.                              Differential diagnosis includes, but is not limited to, vasovagal, orthostatic, cardiogenic, arrhythmia, ischemia, GI bleed, anemia, electrolyte abnormality  This patient is a 71 year old male with multiple comorbidities including end-stage  renal disease on peritoneal dialysis and history of CABG who presents after syncopal episode.  This is in the setting of patient having some generalized GI discomfort and acid reflux earlier today and several episodes of bright red blood per rectum.  He has had hematochezia in the past before which is attributed to hemorrhoids.  His episode of syncope occurred after he had gone to the bathroom and had a prodrome of lightheadedness and lower extremity weakness.  Was witnessed by his wife.  He had no preceding chest pain palpitations or dyspnea.  Currently he feels back to baseline.  He denies chest pain or abdominal discomfort currently.  On exam appears overall well abdomen is soft nontender throughout and he has brown stool on rectal exam.  Blood work overall also reassuring hemoglobin is 9.9 which is similar to prior, creatinine 4.3 with normal electrolytes.  I reviewed his EKG which is not entirely normal but similar to prior, there are inferior lateral ST depressions and T wave inversions but again this is unchanged.  Trop is 22, likely in the s/o esrd. Will need a repeat. On reassessment pt continues to feel well. At the time of signout he is pending a repeat trop and to ambulate.       FINAL CLINICAL IMPRESSION(S) / ED DIAGNOSES   Final diagnoses:  Syncope, unspecified syncope type     Rx / DC Orders   ED Discharge Orders     None        Note:  This document was prepared using Dragon voice recognition software and may include unintentional dictation errors.   Rada Hay, MD 05/19/21 [phone]

## 2021-05-19 NOTE — ED Notes (Signed)
EDP at bedside for MSE.  

## 2021-05-19 NOTE — ED Notes (Signed)
Follow up pcp and neuro all questions answered. All info provided

## 2021-05-20 DIAGNOSIS — E559 Vitamin D deficiency, unspecified: Secondary | ICD-10-CM | POA: Diagnosis not present

## 2021-05-20 DIAGNOSIS — Z992 Dependence on renal dialysis: Secondary | ICD-10-CM | POA: Diagnosis not present

## 2021-05-20 DIAGNOSIS — N186 End stage renal disease: Secondary | ICD-10-CM | POA: Diagnosis not present

## 2021-05-20 DIAGNOSIS — Z23 Encounter for immunization: Secondary | ICD-10-CM | POA: Diagnosis not present

## 2021-05-20 DIAGNOSIS — D631 Anemia in chronic kidney disease: Secondary | ICD-10-CM | POA: Diagnosis not present

## 2021-05-20 DIAGNOSIS — D509 Iron deficiency anemia, unspecified: Secondary | ICD-10-CM | POA: Diagnosis not present

## 2021-05-20 LAB — BPAM RBC
Blood Product Expiration Date: 202301292359
Blood Product Expiration Date: 202301302359
Unit Type and Rh: 9500
Unit Type and Rh: 9500

## 2021-05-20 LAB — TYPE AND SCREEN
ABO/RH(D): O POS
Antibody Screen: POSITIVE
Unit division: 0
Unit division: 0

## 2021-05-21 DIAGNOSIS — N186 End stage renal disease: Secondary | ICD-10-CM | POA: Diagnosis not present

## 2021-05-21 DIAGNOSIS — D509 Iron deficiency anemia, unspecified: Secondary | ICD-10-CM | POA: Diagnosis not present

## 2021-05-21 DIAGNOSIS — Z23 Encounter for immunization: Secondary | ICD-10-CM | POA: Diagnosis not present

## 2021-05-21 DIAGNOSIS — Z992 Dependence on renal dialysis: Secondary | ICD-10-CM | POA: Diagnosis not present

## 2021-05-21 DIAGNOSIS — E559 Vitamin D deficiency, unspecified: Secondary | ICD-10-CM | POA: Diagnosis not present

## 2021-05-21 DIAGNOSIS — D631 Anemia in chronic kidney disease: Secondary | ICD-10-CM | POA: Diagnosis not present

## 2021-05-22 DIAGNOSIS — D631 Anemia in chronic kidney disease: Secondary | ICD-10-CM | POA: Diagnosis not present

## 2021-05-22 DIAGNOSIS — Z23 Encounter for immunization: Secondary | ICD-10-CM | POA: Diagnosis not present

## 2021-05-22 DIAGNOSIS — Z992 Dependence on renal dialysis: Secondary | ICD-10-CM | POA: Diagnosis not present

## 2021-05-22 DIAGNOSIS — D509 Iron deficiency anemia, unspecified: Secondary | ICD-10-CM | POA: Diagnosis not present

## 2021-05-22 DIAGNOSIS — N186 End stage renal disease: Secondary | ICD-10-CM | POA: Diagnosis not present

## 2021-05-22 DIAGNOSIS — E559 Vitamin D deficiency, unspecified: Secondary | ICD-10-CM | POA: Diagnosis not present

## 2021-05-23 DIAGNOSIS — D631 Anemia in chronic kidney disease: Secondary | ICD-10-CM | POA: Diagnosis not present

## 2021-05-23 DIAGNOSIS — N186 End stage renal disease: Secondary | ICD-10-CM | POA: Diagnosis not present

## 2021-05-23 DIAGNOSIS — Z23 Encounter for immunization: Secondary | ICD-10-CM | POA: Diagnosis not present

## 2021-05-23 DIAGNOSIS — Z992 Dependence on renal dialysis: Secondary | ICD-10-CM | POA: Diagnosis not present

## 2021-05-23 DIAGNOSIS — D509 Iron deficiency anemia, unspecified: Secondary | ICD-10-CM | POA: Diagnosis not present

## 2021-05-23 DIAGNOSIS — E559 Vitamin D deficiency, unspecified: Secondary | ICD-10-CM | POA: Diagnosis not present

## 2021-05-24 DIAGNOSIS — Z23 Encounter for immunization: Secondary | ICD-10-CM | POA: Diagnosis not present

## 2021-05-24 DIAGNOSIS — N186 End stage renal disease: Secondary | ICD-10-CM | POA: Diagnosis not present

## 2021-05-24 DIAGNOSIS — D631 Anemia in chronic kidney disease: Secondary | ICD-10-CM | POA: Diagnosis not present

## 2021-05-24 DIAGNOSIS — D509 Iron deficiency anemia, unspecified: Secondary | ICD-10-CM | POA: Diagnosis not present

## 2021-05-24 DIAGNOSIS — Z992 Dependence on renal dialysis: Secondary | ICD-10-CM | POA: Diagnosis not present

## 2021-05-24 DIAGNOSIS — E559 Vitamin D deficiency, unspecified: Secondary | ICD-10-CM | POA: Diagnosis not present

## 2021-05-25 DIAGNOSIS — D631 Anemia in chronic kidney disease: Secondary | ICD-10-CM | POA: Diagnosis not present

## 2021-05-25 DIAGNOSIS — Z992 Dependence on renal dialysis: Secondary | ICD-10-CM | POA: Diagnosis not present

## 2021-05-25 DIAGNOSIS — N186 End stage renal disease: Secondary | ICD-10-CM | POA: Diagnosis not present

## 2021-05-25 DIAGNOSIS — E559 Vitamin D deficiency, unspecified: Secondary | ICD-10-CM | POA: Diagnosis not present

## 2021-05-25 DIAGNOSIS — D509 Iron deficiency anemia, unspecified: Secondary | ICD-10-CM | POA: Diagnosis not present

## 2021-05-26 DIAGNOSIS — Z992 Dependence on renal dialysis: Secondary | ICD-10-CM | POA: Diagnosis not present

## 2021-05-26 DIAGNOSIS — N186 End stage renal disease: Secondary | ICD-10-CM | POA: Diagnosis not present

## 2021-05-26 DIAGNOSIS — D509 Iron deficiency anemia, unspecified: Secondary | ICD-10-CM | POA: Diagnosis not present

## 2021-05-26 DIAGNOSIS — D631 Anemia in chronic kidney disease: Secondary | ICD-10-CM | POA: Diagnosis not present

## 2021-05-26 DIAGNOSIS — E559 Vitamin D deficiency, unspecified: Secondary | ICD-10-CM | POA: Diagnosis not present

## 2021-05-27 DIAGNOSIS — N186 End stage renal disease: Secondary | ICD-10-CM | POA: Diagnosis not present

## 2021-05-27 DIAGNOSIS — E559 Vitamin D deficiency, unspecified: Secondary | ICD-10-CM | POA: Diagnosis not present

## 2021-05-27 DIAGNOSIS — Z992 Dependence on renal dialysis: Secondary | ICD-10-CM | POA: Diagnosis not present

## 2021-05-27 DIAGNOSIS — D509 Iron deficiency anemia, unspecified: Secondary | ICD-10-CM | POA: Diagnosis not present

## 2021-05-27 DIAGNOSIS — D631 Anemia in chronic kidney disease: Secondary | ICD-10-CM | POA: Diagnosis not present

## 2021-05-28 DIAGNOSIS — Z992 Dependence on renal dialysis: Secondary | ICD-10-CM | POA: Diagnosis not present

## 2021-05-28 DIAGNOSIS — D631 Anemia in chronic kidney disease: Secondary | ICD-10-CM | POA: Diagnosis not present

## 2021-05-28 DIAGNOSIS — N186 End stage renal disease: Secondary | ICD-10-CM | POA: Diagnosis not present

## 2021-05-28 DIAGNOSIS — E559 Vitamin D deficiency, unspecified: Secondary | ICD-10-CM | POA: Diagnosis not present

## 2021-05-28 DIAGNOSIS — D509 Iron deficiency anemia, unspecified: Secondary | ICD-10-CM | POA: Diagnosis not present

## 2021-05-29 DIAGNOSIS — N186 End stage renal disease: Secondary | ICD-10-CM | POA: Diagnosis not present

## 2021-05-29 DIAGNOSIS — D631 Anemia in chronic kidney disease: Secondary | ICD-10-CM | POA: Diagnosis not present

## 2021-05-29 DIAGNOSIS — E559 Vitamin D deficiency, unspecified: Secondary | ICD-10-CM | POA: Diagnosis not present

## 2021-05-29 DIAGNOSIS — Z992 Dependence on renal dialysis: Secondary | ICD-10-CM | POA: Diagnosis not present

## 2021-05-29 DIAGNOSIS — D509 Iron deficiency anemia, unspecified: Secondary | ICD-10-CM | POA: Diagnosis not present

## 2021-05-30 DIAGNOSIS — N186 End stage renal disease: Secondary | ICD-10-CM | POA: Diagnosis not present

## 2021-05-30 DIAGNOSIS — D509 Iron deficiency anemia, unspecified: Secondary | ICD-10-CM | POA: Diagnosis not present

## 2021-05-30 DIAGNOSIS — E559 Vitamin D deficiency, unspecified: Secondary | ICD-10-CM | POA: Diagnosis not present

## 2021-05-30 DIAGNOSIS — Z992 Dependence on renal dialysis: Secondary | ICD-10-CM | POA: Diagnosis not present

## 2021-05-30 DIAGNOSIS — D631 Anemia in chronic kidney disease: Secondary | ICD-10-CM | POA: Diagnosis not present

## 2021-05-31 DIAGNOSIS — N186 End stage renal disease: Secondary | ICD-10-CM | POA: Diagnosis not present

## 2021-05-31 DIAGNOSIS — D631 Anemia in chronic kidney disease: Secondary | ICD-10-CM | POA: Diagnosis not present

## 2021-05-31 DIAGNOSIS — Z992 Dependence on renal dialysis: Secondary | ICD-10-CM | POA: Diagnosis not present

## 2021-05-31 DIAGNOSIS — E559 Vitamin D deficiency, unspecified: Secondary | ICD-10-CM | POA: Diagnosis not present

## 2021-05-31 DIAGNOSIS — D509 Iron deficiency anemia, unspecified: Secondary | ICD-10-CM | POA: Diagnosis not present

## 2021-06-01 DIAGNOSIS — E559 Vitamin D deficiency, unspecified: Secondary | ICD-10-CM | POA: Diagnosis not present

## 2021-06-01 DIAGNOSIS — D631 Anemia in chronic kidney disease: Secondary | ICD-10-CM | POA: Diagnosis not present

## 2021-06-01 DIAGNOSIS — D509 Iron deficiency anemia, unspecified: Secondary | ICD-10-CM | POA: Diagnosis not present

## 2021-06-01 DIAGNOSIS — Z992 Dependence on renal dialysis: Secondary | ICD-10-CM | POA: Diagnosis not present

## 2021-06-01 DIAGNOSIS — N186 End stage renal disease: Secondary | ICD-10-CM | POA: Diagnosis not present

## 2021-06-02 DIAGNOSIS — E559 Vitamin D deficiency, unspecified: Secondary | ICD-10-CM | POA: Diagnosis not present

## 2021-06-02 DIAGNOSIS — D631 Anemia in chronic kidney disease: Secondary | ICD-10-CM | POA: Diagnosis not present

## 2021-06-02 DIAGNOSIS — D509 Iron deficiency anemia, unspecified: Secondary | ICD-10-CM | POA: Diagnosis not present

## 2021-06-02 DIAGNOSIS — N186 End stage renal disease: Secondary | ICD-10-CM | POA: Diagnosis not present

## 2021-06-02 DIAGNOSIS — Z992 Dependence on renal dialysis: Secondary | ICD-10-CM | POA: Diagnosis not present

## 2021-06-03 DIAGNOSIS — E559 Vitamin D deficiency, unspecified: Secondary | ICD-10-CM | POA: Diagnosis not present

## 2021-06-03 DIAGNOSIS — Z992 Dependence on renal dialysis: Secondary | ICD-10-CM | POA: Diagnosis not present

## 2021-06-03 DIAGNOSIS — D631 Anemia in chronic kidney disease: Secondary | ICD-10-CM | POA: Diagnosis not present

## 2021-06-03 DIAGNOSIS — D509 Iron deficiency anemia, unspecified: Secondary | ICD-10-CM | POA: Diagnosis not present

## 2021-06-03 DIAGNOSIS — N186 End stage renal disease: Secondary | ICD-10-CM | POA: Diagnosis not present

## 2021-06-04 DIAGNOSIS — Z992 Dependence on renal dialysis: Secondary | ICD-10-CM | POA: Diagnosis not present

## 2021-06-04 DIAGNOSIS — D631 Anemia in chronic kidney disease: Secondary | ICD-10-CM | POA: Diagnosis not present

## 2021-06-04 DIAGNOSIS — N186 End stage renal disease: Secondary | ICD-10-CM | POA: Diagnosis not present

## 2021-06-04 DIAGNOSIS — D509 Iron deficiency anemia, unspecified: Secondary | ICD-10-CM | POA: Diagnosis not present

## 2021-06-04 DIAGNOSIS — E559 Vitamin D deficiency, unspecified: Secondary | ICD-10-CM | POA: Diagnosis not present

## 2021-06-05 DIAGNOSIS — D509 Iron deficiency anemia, unspecified: Secondary | ICD-10-CM | POA: Diagnosis not present

## 2021-06-05 DIAGNOSIS — E559 Vitamin D deficiency, unspecified: Secondary | ICD-10-CM | POA: Diagnosis not present

## 2021-06-05 DIAGNOSIS — N186 End stage renal disease: Secondary | ICD-10-CM | POA: Diagnosis not present

## 2021-06-05 DIAGNOSIS — D631 Anemia in chronic kidney disease: Secondary | ICD-10-CM | POA: Diagnosis not present

## 2021-06-05 DIAGNOSIS — Z992 Dependence on renal dialysis: Secondary | ICD-10-CM | POA: Diagnosis not present

## 2021-06-06 DIAGNOSIS — Z992 Dependence on renal dialysis: Secondary | ICD-10-CM | POA: Diagnosis not present

## 2021-06-06 DIAGNOSIS — D509 Iron deficiency anemia, unspecified: Secondary | ICD-10-CM | POA: Diagnosis not present

## 2021-06-06 DIAGNOSIS — N186 End stage renal disease: Secondary | ICD-10-CM | POA: Diagnosis not present

## 2021-06-06 DIAGNOSIS — E559 Vitamin D deficiency, unspecified: Secondary | ICD-10-CM | POA: Diagnosis not present

## 2021-06-06 DIAGNOSIS — D631 Anemia in chronic kidney disease: Secondary | ICD-10-CM | POA: Diagnosis not present

## 2021-06-07 DIAGNOSIS — Z992 Dependence on renal dialysis: Secondary | ICD-10-CM | POA: Diagnosis not present

## 2021-06-07 DIAGNOSIS — E559 Vitamin D deficiency, unspecified: Secondary | ICD-10-CM | POA: Diagnosis not present

## 2021-06-07 DIAGNOSIS — N186 End stage renal disease: Secondary | ICD-10-CM | POA: Diagnosis not present

## 2021-06-07 DIAGNOSIS — D631 Anemia in chronic kidney disease: Secondary | ICD-10-CM | POA: Diagnosis not present

## 2021-06-07 DIAGNOSIS — D509 Iron deficiency anemia, unspecified: Secondary | ICD-10-CM | POA: Diagnosis not present

## 2021-06-08 DIAGNOSIS — E559 Vitamin D deficiency, unspecified: Secondary | ICD-10-CM | POA: Diagnosis not present

## 2021-06-08 DIAGNOSIS — D631 Anemia in chronic kidney disease: Secondary | ICD-10-CM | POA: Diagnosis not present

## 2021-06-08 DIAGNOSIS — D509 Iron deficiency anemia, unspecified: Secondary | ICD-10-CM | POA: Diagnosis not present

## 2021-06-08 DIAGNOSIS — Z992 Dependence on renal dialysis: Secondary | ICD-10-CM | POA: Diagnosis not present

## 2021-06-08 DIAGNOSIS — N186 End stage renal disease: Secondary | ICD-10-CM | POA: Diagnosis not present

## 2021-06-09 DIAGNOSIS — N186 End stage renal disease: Secondary | ICD-10-CM | POA: Diagnosis not present

## 2021-06-09 DIAGNOSIS — E559 Vitamin D deficiency, unspecified: Secondary | ICD-10-CM | POA: Diagnosis not present

## 2021-06-09 DIAGNOSIS — D631 Anemia in chronic kidney disease: Secondary | ICD-10-CM | POA: Diagnosis not present

## 2021-06-09 DIAGNOSIS — D509 Iron deficiency anemia, unspecified: Secondary | ICD-10-CM | POA: Diagnosis not present

## 2021-06-09 DIAGNOSIS — Z992 Dependence on renal dialysis: Secondary | ICD-10-CM | POA: Diagnosis not present

## 2021-06-10 DIAGNOSIS — Z992 Dependence on renal dialysis: Secondary | ICD-10-CM | POA: Diagnosis not present

## 2021-06-10 DIAGNOSIS — E559 Vitamin D deficiency, unspecified: Secondary | ICD-10-CM | POA: Diagnosis not present

## 2021-06-10 DIAGNOSIS — N186 End stage renal disease: Secondary | ICD-10-CM | POA: Diagnosis not present

## 2021-06-10 DIAGNOSIS — D631 Anemia in chronic kidney disease: Secondary | ICD-10-CM | POA: Diagnosis not present

## 2021-06-10 DIAGNOSIS — D509 Iron deficiency anemia, unspecified: Secondary | ICD-10-CM | POA: Diagnosis not present

## 2021-06-11 DIAGNOSIS — D631 Anemia in chronic kidney disease: Secondary | ICD-10-CM | POA: Diagnosis not present

## 2021-06-11 DIAGNOSIS — E559 Vitamin D deficiency, unspecified: Secondary | ICD-10-CM | POA: Diagnosis not present

## 2021-06-11 DIAGNOSIS — D509 Iron deficiency anemia, unspecified: Secondary | ICD-10-CM | POA: Diagnosis not present

## 2021-06-11 DIAGNOSIS — Z992 Dependence on renal dialysis: Secondary | ICD-10-CM | POA: Diagnosis not present

## 2021-06-11 DIAGNOSIS — N186 End stage renal disease: Secondary | ICD-10-CM | POA: Diagnosis not present

## 2021-06-12 DIAGNOSIS — N186 End stage renal disease: Secondary | ICD-10-CM | POA: Diagnosis not present

## 2021-06-12 DIAGNOSIS — Z992 Dependence on renal dialysis: Secondary | ICD-10-CM | POA: Diagnosis not present

## 2021-06-12 DIAGNOSIS — D631 Anemia in chronic kidney disease: Secondary | ICD-10-CM | POA: Diagnosis not present

## 2021-06-12 DIAGNOSIS — D509 Iron deficiency anemia, unspecified: Secondary | ICD-10-CM | POA: Diagnosis not present

## 2021-06-12 DIAGNOSIS — E559 Vitamin D deficiency, unspecified: Secondary | ICD-10-CM | POA: Diagnosis not present

## 2021-06-13 DIAGNOSIS — E559 Vitamin D deficiency, unspecified: Secondary | ICD-10-CM | POA: Diagnosis not present

## 2021-06-13 DIAGNOSIS — D631 Anemia in chronic kidney disease: Secondary | ICD-10-CM | POA: Diagnosis not present

## 2021-06-13 DIAGNOSIS — N186 End stage renal disease: Secondary | ICD-10-CM | POA: Diagnosis not present

## 2021-06-13 DIAGNOSIS — D509 Iron deficiency anemia, unspecified: Secondary | ICD-10-CM | POA: Diagnosis not present

## 2021-06-13 DIAGNOSIS — Z992 Dependence on renal dialysis: Secondary | ICD-10-CM | POA: Diagnosis not present

## 2021-06-14 DIAGNOSIS — Z992 Dependence on renal dialysis: Secondary | ICD-10-CM | POA: Diagnosis not present

## 2021-06-14 DIAGNOSIS — E559 Vitamin D deficiency, unspecified: Secondary | ICD-10-CM | POA: Diagnosis not present

## 2021-06-14 DIAGNOSIS — N186 End stage renal disease: Secondary | ICD-10-CM | POA: Diagnosis not present

## 2021-06-14 DIAGNOSIS — D509 Iron deficiency anemia, unspecified: Secondary | ICD-10-CM | POA: Diagnosis not present

## 2021-06-14 DIAGNOSIS — D631 Anemia in chronic kidney disease: Secondary | ICD-10-CM | POA: Diagnosis not present

## 2021-06-15 DIAGNOSIS — D631 Anemia in chronic kidney disease: Secondary | ICD-10-CM | POA: Diagnosis not present

## 2021-06-15 DIAGNOSIS — Z992 Dependence on renal dialysis: Secondary | ICD-10-CM | POA: Diagnosis not present

## 2021-06-15 DIAGNOSIS — E559 Vitamin D deficiency, unspecified: Secondary | ICD-10-CM | POA: Diagnosis not present

## 2021-06-15 DIAGNOSIS — D509 Iron deficiency anemia, unspecified: Secondary | ICD-10-CM | POA: Diagnosis not present

## 2021-06-15 DIAGNOSIS — N186 End stage renal disease: Secondary | ICD-10-CM | POA: Diagnosis not present

## 2021-06-16 DIAGNOSIS — E559 Vitamin D deficiency, unspecified: Secondary | ICD-10-CM | POA: Diagnosis not present

## 2021-06-16 DIAGNOSIS — Z992 Dependence on renal dialysis: Secondary | ICD-10-CM | POA: Diagnosis not present

## 2021-06-16 DIAGNOSIS — N186 End stage renal disease: Secondary | ICD-10-CM | POA: Diagnosis not present

## 2021-06-16 DIAGNOSIS — D509 Iron deficiency anemia, unspecified: Secondary | ICD-10-CM | POA: Diagnosis not present

## 2021-06-16 DIAGNOSIS — D631 Anemia in chronic kidney disease: Secondary | ICD-10-CM | POA: Diagnosis not present

## 2021-06-17 DIAGNOSIS — Z992 Dependence on renal dialysis: Secondary | ICD-10-CM | POA: Diagnosis not present

## 2021-06-17 DIAGNOSIS — D509 Iron deficiency anemia, unspecified: Secondary | ICD-10-CM | POA: Diagnosis not present

## 2021-06-17 DIAGNOSIS — D631 Anemia in chronic kidney disease: Secondary | ICD-10-CM | POA: Diagnosis not present

## 2021-06-17 DIAGNOSIS — E559 Vitamin D deficiency, unspecified: Secondary | ICD-10-CM | POA: Diagnosis not present

## 2021-06-17 DIAGNOSIS — N186 End stage renal disease: Secondary | ICD-10-CM | POA: Diagnosis not present

## 2021-06-18 DIAGNOSIS — D509 Iron deficiency anemia, unspecified: Secondary | ICD-10-CM | POA: Diagnosis not present

## 2021-06-18 DIAGNOSIS — N186 End stage renal disease: Secondary | ICD-10-CM | POA: Diagnosis not present

## 2021-06-18 DIAGNOSIS — D631 Anemia in chronic kidney disease: Secondary | ICD-10-CM | POA: Diagnosis not present

## 2021-06-18 DIAGNOSIS — Z992 Dependence on renal dialysis: Secondary | ICD-10-CM | POA: Diagnosis not present

## 2021-06-18 DIAGNOSIS — E559 Vitamin D deficiency, unspecified: Secondary | ICD-10-CM | POA: Diagnosis not present

## 2021-06-19 DIAGNOSIS — N186 End stage renal disease: Secondary | ICD-10-CM | POA: Diagnosis not present

## 2021-06-19 DIAGNOSIS — Z992 Dependence on renal dialysis: Secondary | ICD-10-CM | POA: Diagnosis not present

## 2021-06-19 DIAGNOSIS — D631 Anemia in chronic kidney disease: Secondary | ICD-10-CM | POA: Diagnosis not present

## 2021-06-19 DIAGNOSIS — D509 Iron deficiency anemia, unspecified: Secondary | ICD-10-CM | POA: Diagnosis not present

## 2021-06-19 DIAGNOSIS — E559 Vitamin D deficiency, unspecified: Secondary | ICD-10-CM | POA: Diagnosis not present

## 2021-06-20 DIAGNOSIS — E559 Vitamin D deficiency, unspecified: Secondary | ICD-10-CM | POA: Diagnosis not present

## 2021-06-20 DIAGNOSIS — Z992 Dependence on renal dialysis: Secondary | ICD-10-CM | POA: Diagnosis not present

## 2021-06-20 DIAGNOSIS — N186 End stage renal disease: Secondary | ICD-10-CM | POA: Diagnosis not present

## 2021-06-20 DIAGNOSIS — D631 Anemia in chronic kidney disease: Secondary | ICD-10-CM | POA: Diagnosis not present

## 2021-06-20 DIAGNOSIS — D509 Iron deficiency anemia, unspecified: Secondary | ICD-10-CM | POA: Diagnosis not present

## 2021-06-21 DIAGNOSIS — D631 Anemia in chronic kidney disease: Secondary | ICD-10-CM | POA: Diagnosis not present

## 2021-06-21 DIAGNOSIS — Z992 Dependence on renal dialysis: Secondary | ICD-10-CM | POA: Diagnosis not present

## 2021-06-21 DIAGNOSIS — D509 Iron deficiency anemia, unspecified: Secondary | ICD-10-CM | POA: Diagnosis not present

## 2021-06-21 DIAGNOSIS — E559 Vitamin D deficiency, unspecified: Secondary | ICD-10-CM | POA: Diagnosis not present

## 2021-06-21 DIAGNOSIS — N186 End stage renal disease: Secondary | ICD-10-CM | POA: Diagnosis not present

## 2021-06-22 DIAGNOSIS — D631 Anemia in chronic kidney disease: Secondary | ICD-10-CM | POA: Diagnosis not present

## 2021-06-22 DIAGNOSIS — Z992 Dependence on renal dialysis: Secondary | ICD-10-CM | POA: Diagnosis not present

## 2021-06-22 DIAGNOSIS — E559 Vitamin D deficiency, unspecified: Secondary | ICD-10-CM | POA: Diagnosis not present

## 2021-06-22 DIAGNOSIS — N186 End stage renal disease: Secondary | ICD-10-CM | POA: Diagnosis not present

## 2021-06-22 DIAGNOSIS — D509 Iron deficiency anemia, unspecified: Secondary | ICD-10-CM | POA: Diagnosis not present

## 2021-06-23 DIAGNOSIS — D509 Iron deficiency anemia, unspecified: Secondary | ICD-10-CM | POA: Diagnosis not present

## 2021-06-23 DIAGNOSIS — N186 End stage renal disease: Secondary | ICD-10-CM | POA: Diagnosis not present

## 2021-06-23 DIAGNOSIS — E559 Vitamin D deficiency, unspecified: Secondary | ICD-10-CM | POA: Diagnosis not present

## 2021-06-23 DIAGNOSIS — D631 Anemia in chronic kidney disease: Secondary | ICD-10-CM | POA: Diagnosis not present

## 2021-06-23 DIAGNOSIS — Z992 Dependence on renal dialysis: Secondary | ICD-10-CM | POA: Diagnosis not present

## 2021-06-24 DIAGNOSIS — D509 Iron deficiency anemia, unspecified: Secondary | ICD-10-CM | POA: Diagnosis not present

## 2021-06-24 DIAGNOSIS — N186 End stage renal disease: Secondary | ICD-10-CM | POA: Diagnosis not present

## 2021-06-24 DIAGNOSIS — E559 Vitamin D deficiency, unspecified: Secondary | ICD-10-CM | POA: Diagnosis not present

## 2021-06-24 DIAGNOSIS — Z992 Dependence on renal dialysis: Secondary | ICD-10-CM | POA: Diagnosis not present

## 2021-06-24 DIAGNOSIS — D631 Anemia in chronic kidney disease: Secondary | ICD-10-CM | POA: Diagnosis not present

## 2021-06-25 DIAGNOSIS — E559 Vitamin D deficiency, unspecified: Secondary | ICD-10-CM | POA: Diagnosis not present

## 2021-06-25 DIAGNOSIS — Z992 Dependence on renal dialysis: Secondary | ICD-10-CM | POA: Diagnosis not present

## 2021-06-25 DIAGNOSIS — N186 End stage renal disease: Secondary | ICD-10-CM | POA: Diagnosis not present

## 2021-06-25 DIAGNOSIS — D509 Iron deficiency anemia, unspecified: Secondary | ICD-10-CM | POA: Diagnosis not present

## 2021-06-25 DIAGNOSIS — D631 Anemia in chronic kidney disease: Secondary | ICD-10-CM | POA: Diagnosis not present

## 2021-06-26 DIAGNOSIS — E559 Vitamin D deficiency, unspecified: Secondary | ICD-10-CM | POA: Diagnosis not present

## 2021-06-26 DIAGNOSIS — Z992 Dependence on renal dialysis: Secondary | ICD-10-CM | POA: Diagnosis not present

## 2021-06-26 DIAGNOSIS — D509 Iron deficiency anemia, unspecified: Secondary | ICD-10-CM | POA: Diagnosis not present

## 2021-06-26 DIAGNOSIS — D631 Anemia in chronic kidney disease: Secondary | ICD-10-CM | POA: Diagnosis not present

## 2021-06-26 DIAGNOSIS — N186 End stage renal disease: Secondary | ICD-10-CM | POA: Diagnosis not present

## 2021-06-27 DIAGNOSIS — Z992 Dependence on renal dialysis: Secondary | ICD-10-CM | POA: Diagnosis not present

## 2021-06-27 DIAGNOSIS — D509 Iron deficiency anemia, unspecified: Secondary | ICD-10-CM | POA: Diagnosis not present

## 2021-06-27 DIAGNOSIS — D631 Anemia in chronic kidney disease: Secondary | ICD-10-CM | POA: Diagnosis not present

## 2021-06-27 DIAGNOSIS — E559 Vitamin D deficiency, unspecified: Secondary | ICD-10-CM | POA: Diagnosis not present

## 2021-06-27 DIAGNOSIS — N186 End stage renal disease: Secondary | ICD-10-CM | POA: Diagnosis not present

## 2021-06-28 DIAGNOSIS — Z992 Dependence on renal dialysis: Secondary | ICD-10-CM | POA: Diagnosis not present

## 2021-06-28 DIAGNOSIS — N186 End stage renal disease: Secondary | ICD-10-CM | POA: Diagnosis not present

## 2021-06-28 DIAGNOSIS — D631 Anemia in chronic kidney disease: Secondary | ICD-10-CM | POA: Diagnosis not present

## 2021-06-28 DIAGNOSIS — D509 Iron deficiency anemia, unspecified: Secondary | ICD-10-CM | POA: Diagnosis not present

## 2021-06-28 DIAGNOSIS — E559 Vitamin D deficiency, unspecified: Secondary | ICD-10-CM | POA: Diagnosis not present

## 2021-06-29 DIAGNOSIS — D631 Anemia in chronic kidney disease: Secondary | ICD-10-CM | POA: Diagnosis not present

## 2021-06-29 DIAGNOSIS — E559 Vitamin D deficiency, unspecified: Secondary | ICD-10-CM | POA: Diagnosis not present

## 2021-06-29 DIAGNOSIS — Z992 Dependence on renal dialysis: Secondary | ICD-10-CM | POA: Diagnosis not present

## 2021-06-29 DIAGNOSIS — D509 Iron deficiency anemia, unspecified: Secondary | ICD-10-CM | POA: Diagnosis not present

## 2021-06-29 DIAGNOSIS — N186 End stage renal disease: Secondary | ICD-10-CM | POA: Diagnosis not present

## 2021-06-30 DIAGNOSIS — E559 Vitamin D deficiency, unspecified: Secondary | ICD-10-CM | POA: Diagnosis not present

## 2021-06-30 DIAGNOSIS — D631 Anemia in chronic kidney disease: Secondary | ICD-10-CM | POA: Diagnosis not present

## 2021-06-30 DIAGNOSIS — D509 Iron deficiency anemia, unspecified: Secondary | ICD-10-CM | POA: Diagnosis not present

## 2021-06-30 DIAGNOSIS — Z992 Dependence on renal dialysis: Secondary | ICD-10-CM | POA: Diagnosis not present

## 2021-06-30 DIAGNOSIS — N186 End stage renal disease: Secondary | ICD-10-CM | POA: Diagnosis not present

## 2021-07-01 DIAGNOSIS — Z992 Dependence on renal dialysis: Secondary | ICD-10-CM | POA: Diagnosis not present

## 2021-07-01 DIAGNOSIS — E559 Vitamin D deficiency, unspecified: Secondary | ICD-10-CM | POA: Diagnosis not present

## 2021-07-01 DIAGNOSIS — D631 Anemia in chronic kidney disease: Secondary | ICD-10-CM | POA: Diagnosis not present

## 2021-07-01 DIAGNOSIS — D509 Iron deficiency anemia, unspecified: Secondary | ICD-10-CM | POA: Diagnosis not present

## 2021-07-01 DIAGNOSIS — N186 End stage renal disease: Secondary | ICD-10-CM | POA: Diagnosis not present

## 2021-07-02 DIAGNOSIS — N186 End stage renal disease: Secondary | ICD-10-CM | POA: Diagnosis not present

## 2021-07-02 DIAGNOSIS — D509 Iron deficiency anemia, unspecified: Secondary | ICD-10-CM | POA: Diagnosis not present

## 2021-07-02 DIAGNOSIS — N281 Cyst of kidney, acquired: Secondary | ICD-10-CM | POA: Diagnosis not present

## 2021-07-02 DIAGNOSIS — E559 Vitamin D deficiency, unspecified: Secondary | ICD-10-CM | POA: Diagnosis not present

## 2021-07-02 DIAGNOSIS — Z7682 Awaiting organ transplant status: Secondary | ICD-10-CM | POA: Diagnosis not present

## 2021-07-02 DIAGNOSIS — D631 Anemia in chronic kidney disease: Secondary | ICD-10-CM | POA: Diagnosis not present

## 2021-07-02 DIAGNOSIS — N3289 Other specified disorders of bladder: Secondary | ICD-10-CM | POA: Diagnosis not present

## 2021-07-02 DIAGNOSIS — Z992 Dependence on renal dialysis: Secondary | ICD-10-CM | POA: Diagnosis not present

## 2021-07-03 DIAGNOSIS — E559 Vitamin D deficiency, unspecified: Secondary | ICD-10-CM | POA: Diagnosis not present

## 2021-07-03 DIAGNOSIS — D631 Anemia in chronic kidney disease: Secondary | ICD-10-CM | POA: Diagnosis not present

## 2021-07-03 DIAGNOSIS — N186 End stage renal disease: Secondary | ICD-10-CM | POA: Diagnosis not present

## 2021-07-03 DIAGNOSIS — D509 Iron deficiency anemia, unspecified: Secondary | ICD-10-CM | POA: Diagnosis not present

## 2021-07-03 DIAGNOSIS — Z992 Dependence on renal dialysis: Secondary | ICD-10-CM | POA: Diagnosis not present

## 2021-07-04 DIAGNOSIS — Z992 Dependence on renal dialysis: Secondary | ICD-10-CM | POA: Diagnosis not present

## 2021-07-04 DIAGNOSIS — N186 End stage renal disease: Secondary | ICD-10-CM | POA: Diagnosis not present

## 2021-07-04 DIAGNOSIS — D631 Anemia in chronic kidney disease: Secondary | ICD-10-CM | POA: Diagnosis not present

## 2021-07-04 DIAGNOSIS — D509 Iron deficiency anemia, unspecified: Secondary | ICD-10-CM | POA: Diagnosis not present

## 2021-07-04 DIAGNOSIS — E559 Vitamin D deficiency, unspecified: Secondary | ICD-10-CM | POA: Diagnosis not present

## 2021-07-05 DIAGNOSIS — D631 Anemia in chronic kidney disease: Secondary | ICD-10-CM | POA: Diagnosis not present

## 2021-07-05 DIAGNOSIS — D509 Iron deficiency anemia, unspecified: Secondary | ICD-10-CM | POA: Diagnosis not present

## 2021-07-05 DIAGNOSIS — E559 Vitamin D deficiency, unspecified: Secondary | ICD-10-CM | POA: Diagnosis not present

## 2021-07-05 DIAGNOSIS — Z992 Dependence on renal dialysis: Secondary | ICD-10-CM | POA: Diagnosis not present

## 2021-07-05 DIAGNOSIS — N186 End stage renal disease: Secondary | ICD-10-CM | POA: Diagnosis not present

## 2021-07-06 DIAGNOSIS — D631 Anemia in chronic kidney disease: Secondary | ICD-10-CM | POA: Diagnosis not present

## 2021-07-06 DIAGNOSIS — N186 End stage renal disease: Secondary | ICD-10-CM | POA: Diagnosis not present

## 2021-07-06 DIAGNOSIS — E559 Vitamin D deficiency, unspecified: Secondary | ICD-10-CM | POA: Diagnosis not present

## 2021-07-06 DIAGNOSIS — D509 Iron deficiency anemia, unspecified: Secondary | ICD-10-CM | POA: Diagnosis not present

## 2021-07-06 DIAGNOSIS — Z992 Dependence on renal dialysis: Secondary | ICD-10-CM | POA: Diagnosis not present

## 2021-07-07 DIAGNOSIS — N186 End stage renal disease: Secondary | ICD-10-CM | POA: Diagnosis not present

## 2021-07-07 DIAGNOSIS — D631 Anemia in chronic kidney disease: Secondary | ICD-10-CM | POA: Diagnosis not present

## 2021-07-07 DIAGNOSIS — E559 Vitamin D deficiency, unspecified: Secondary | ICD-10-CM | POA: Diagnosis not present

## 2021-07-07 DIAGNOSIS — D509 Iron deficiency anemia, unspecified: Secondary | ICD-10-CM | POA: Diagnosis not present

## 2021-07-07 DIAGNOSIS — Z992 Dependence on renal dialysis: Secondary | ICD-10-CM | POA: Diagnosis not present

## 2021-07-08 DIAGNOSIS — D631 Anemia in chronic kidney disease: Secondary | ICD-10-CM | POA: Diagnosis not present

## 2021-07-08 DIAGNOSIS — D509 Iron deficiency anemia, unspecified: Secondary | ICD-10-CM | POA: Diagnosis not present

## 2021-07-08 DIAGNOSIS — N186 End stage renal disease: Secondary | ICD-10-CM | POA: Diagnosis not present

## 2021-07-08 DIAGNOSIS — E559 Vitamin D deficiency, unspecified: Secondary | ICD-10-CM | POA: Diagnosis not present

## 2021-07-08 DIAGNOSIS — Z992 Dependence on renal dialysis: Secondary | ICD-10-CM | POA: Diagnosis not present

## 2021-07-09 DIAGNOSIS — E559 Vitamin D deficiency, unspecified: Secondary | ICD-10-CM | POA: Diagnosis not present

## 2021-07-09 DIAGNOSIS — Z992 Dependence on renal dialysis: Secondary | ICD-10-CM | POA: Diagnosis not present

## 2021-07-09 DIAGNOSIS — D509 Iron deficiency anemia, unspecified: Secondary | ICD-10-CM | POA: Diagnosis not present

## 2021-07-09 DIAGNOSIS — N186 End stage renal disease: Secondary | ICD-10-CM | POA: Diagnosis not present

## 2021-07-09 DIAGNOSIS — D631 Anemia in chronic kidney disease: Secondary | ICD-10-CM | POA: Diagnosis not present

## 2021-07-10 DIAGNOSIS — N186 End stage renal disease: Secondary | ICD-10-CM | POA: Diagnosis not present

## 2021-07-10 DIAGNOSIS — Z992 Dependence on renal dialysis: Secondary | ICD-10-CM | POA: Diagnosis not present

## 2021-07-10 DIAGNOSIS — D509 Iron deficiency anemia, unspecified: Secondary | ICD-10-CM | POA: Diagnosis not present

## 2021-07-10 DIAGNOSIS — E559 Vitamin D deficiency, unspecified: Secondary | ICD-10-CM | POA: Diagnosis not present

## 2021-07-10 DIAGNOSIS — D631 Anemia in chronic kidney disease: Secondary | ICD-10-CM | POA: Diagnosis not present

## 2021-07-11 DIAGNOSIS — N186 End stage renal disease: Secondary | ICD-10-CM | POA: Diagnosis not present

## 2021-07-11 DIAGNOSIS — D631 Anemia in chronic kidney disease: Secondary | ICD-10-CM | POA: Diagnosis not present

## 2021-07-11 DIAGNOSIS — N2581 Secondary hyperparathyroidism of renal origin: Secondary | ICD-10-CM | POA: Diagnosis not present

## 2021-07-11 DIAGNOSIS — D509 Iron deficiency anemia, unspecified: Secondary | ICD-10-CM | POA: Diagnosis not present

## 2021-07-11 DIAGNOSIS — Z992 Dependence on renal dialysis: Secondary | ICD-10-CM | POA: Diagnosis not present

## 2021-07-11 DIAGNOSIS — E1159 Type 2 diabetes mellitus with other circulatory complications: Secondary | ICD-10-CM | POA: Diagnosis not present

## 2021-07-11 DIAGNOSIS — E1122 Type 2 diabetes mellitus with diabetic chronic kidney disease: Secondary | ICD-10-CM | POA: Diagnosis not present

## 2021-07-11 DIAGNOSIS — Z794 Long term (current) use of insulin: Secondary | ICD-10-CM | POA: Diagnosis not present

## 2021-07-11 DIAGNOSIS — E1142 Type 2 diabetes mellitus with diabetic polyneuropathy: Secondary | ICD-10-CM | POA: Diagnosis not present

## 2021-07-11 DIAGNOSIS — E559 Vitamin D deficiency, unspecified: Secondary | ICD-10-CM | POA: Diagnosis not present

## 2021-07-11 DIAGNOSIS — Z7682 Awaiting organ transplant status: Secondary | ICD-10-CM | POA: Diagnosis not present

## 2021-07-11 DIAGNOSIS — I10 Essential (primary) hypertension: Secondary | ICD-10-CM | POA: Diagnosis not present

## 2021-07-12 DIAGNOSIS — D509 Iron deficiency anemia, unspecified: Secondary | ICD-10-CM | POA: Diagnosis not present

## 2021-07-12 DIAGNOSIS — Z992 Dependence on renal dialysis: Secondary | ICD-10-CM | POA: Diagnosis not present

## 2021-07-12 DIAGNOSIS — N186 End stage renal disease: Secondary | ICD-10-CM | POA: Diagnosis not present

## 2021-07-12 DIAGNOSIS — D631 Anemia in chronic kidney disease: Secondary | ICD-10-CM | POA: Diagnosis not present

## 2021-07-12 DIAGNOSIS — E559 Vitamin D deficiency, unspecified: Secondary | ICD-10-CM | POA: Diagnosis not present

## 2021-07-13 DIAGNOSIS — Z992 Dependence on renal dialysis: Secondary | ICD-10-CM | POA: Diagnosis not present

## 2021-07-13 DIAGNOSIS — D631 Anemia in chronic kidney disease: Secondary | ICD-10-CM | POA: Diagnosis not present

## 2021-07-13 DIAGNOSIS — E559 Vitamin D deficiency, unspecified: Secondary | ICD-10-CM | POA: Diagnosis not present

## 2021-07-13 DIAGNOSIS — N186 End stage renal disease: Secondary | ICD-10-CM | POA: Diagnosis not present

## 2021-07-13 DIAGNOSIS — D509 Iron deficiency anemia, unspecified: Secondary | ICD-10-CM | POA: Diagnosis not present

## 2021-07-14 DIAGNOSIS — Z992 Dependence on renal dialysis: Secondary | ICD-10-CM | POA: Diagnosis not present

## 2021-07-14 DIAGNOSIS — D631 Anemia in chronic kidney disease: Secondary | ICD-10-CM | POA: Diagnosis not present

## 2021-07-14 DIAGNOSIS — E559 Vitamin D deficiency, unspecified: Secondary | ICD-10-CM | POA: Diagnosis not present

## 2021-07-14 DIAGNOSIS — D509 Iron deficiency anemia, unspecified: Secondary | ICD-10-CM | POA: Diagnosis not present

## 2021-07-14 DIAGNOSIS — N186 End stage renal disease: Secondary | ICD-10-CM | POA: Diagnosis not present

## 2021-07-15 DIAGNOSIS — D631 Anemia in chronic kidney disease: Secondary | ICD-10-CM | POA: Diagnosis not present

## 2021-07-15 DIAGNOSIS — Z992 Dependence on renal dialysis: Secondary | ICD-10-CM | POA: Diagnosis not present

## 2021-07-15 DIAGNOSIS — N186 End stage renal disease: Secondary | ICD-10-CM | POA: Diagnosis not present

## 2021-07-15 DIAGNOSIS — D509 Iron deficiency anemia, unspecified: Secondary | ICD-10-CM | POA: Diagnosis not present

## 2021-07-15 DIAGNOSIS — E559 Vitamin D deficiency, unspecified: Secondary | ICD-10-CM | POA: Diagnosis not present

## 2021-07-16 DIAGNOSIS — D509 Iron deficiency anemia, unspecified: Secondary | ICD-10-CM | POA: Diagnosis not present

## 2021-07-16 DIAGNOSIS — E559 Vitamin D deficiency, unspecified: Secondary | ICD-10-CM | POA: Diagnosis not present

## 2021-07-16 DIAGNOSIS — Z992 Dependence on renal dialysis: Secondary | ICD-10-CM | POA: Diagnosis not present

## 2021-07-16 DIAGNOSIS — D631 Anemia in chronic kidney disease: Secondary | ICD-10-CM | POA: Diagnosis not present

## 2021-07-16 DIAGNOSIS — N186 End stage renal disease: Secondary | ICD-10-CM | POA: Diagnosis not present

## 2021-07-17 DIAGNOSIS — D631 Anemia in chronic kidney disease: Secondary | ICD-10-CM | POA: Diagnosis not present

## 2021-07-17 DIAGNOSIS — D509 Iron deficiency anemia, unspecified: Secondary | ICD-10-CM | POA: Diagnosis not present

## 2021-07-17 DIAGNOSIS — N186 End stage renal disease: Secondary | ICD-10-CM | POA: Diagnosis not present

## 2021-07-17 DIAGNOSIS — Z992 Dependence on renal dialysis: Secondary | ICD-10-CM | POA: Diagnosis not present

## 2021-07-17 DIAGNOSIS — E559 Vitamin D deficiency, unspecified: Secondary | ICD-10-CM | POA: Diagnosis not present

## 2021-07-18 DIAGNOSIS — D509 Iron deficiency anemia, unspecified: Secondary | ICD-10-CM | POA: Diagnosis not present

## 2021-07-18 DIAGNOSIS — E559 Vitamin D deficiency, unspecified: Secondary | ICD-10-CM | POA: Diagnosis not present

## 2021-07-18 DIAGNOSIS — Z992 Dependence on renal dialysis: Secondary | ICD-10-CM | POA: Diagnosis not present

## 2021-07-18 DIAGNOSIS — N186 End stage renal disease: Secondary | ICD-10-CM | POA: Diagnosis not present

## 2021-07-18 DIAGNOSIS — D631 Anemia in chronic kidney disease: Secondary | ICD-10-CM | POA: Diagnosis not present

## 2021-07-19 DIAGNOSIS — D509 Iron deficiency anemia, unspecified: Secondary | ICD-10-CM | POA: Diagnosis not present

## 2021-07-19 DIAGNOSIS — N186 End stage renal disease: Secondary | ICD-10-CM | POA: Diagnosis not present

## 2021-07-19 DIAGNOSIS — E559 Vitamin D deficiency, unspecified: Secondary | ICD-10-CM | POA: Diagnosis not present

## 2021-07-19 DIAGNOSIS — D631 Anemia in chronic kidney disease: Secondary | ICD-10-CM | POA: Diagnosis not present

## 2021-07-19 DIAGNOSIS — Z992 Dependence on renal dialysis: Secondary | ICD-10-CM | POA: Diagnosis not present

## 2021-07-20 DIAGNOSIS — N186 End stage renal disease: Secondary | ICD-10-CM | POA: Diagnosis not present

## 2021-07-20 DIAGNOSIS — E559 Vitamin D deficiency, unspecified: Secondary | ICD-10-CM | POA: Diagnosis not present

## 2021-07-20 DIAGNOSIS — D509 Iron deficiency anemia, unspecified: Secondary | ICD-10-CM | POA: Diagnosis not present

## 2021-07-20 DIAGNOSIS — D631 Anemia in chronic kidney disease: Secondary | ICD-10-CM | POA: Diagnosis not present

## 2021-07-20 DIAGNOSIS — Z992 Dependence on renal dialysis: Secondary | ICD-10-CM | POA: Diagnosis not present

## 2021-07-21 DIAGNOSIS — D509 Iron deficiency anemia, unspecified: Secondary | ICD-10-CM | POA: Diagnosis not present

## 2021-07-21 DIAGNOSIS — N186 End stage renal disease: Secondary | ICD-10-CM | POA: Diagnosis not present

## 2021-07-21 DIAGNOSIS — D631 Anemia in chronic kidney disease: Secondary | ICD-10-CM | POA: Diagnosis not present

## 2021-07-21 DIAGNOSIS — Z992 Dependence on renal dialysis: Secondary | ICD-10-CM | POA: Diagnosis not present

## 2021-07-21 DIAGNOSIS — E559 Vitamin D deficiency, unspecified: Secondary | ICD-10-CM | POA: Diagnosis not present

## 2021-07-22 DIAGNOSIS — D631 Anemia in chronic kidney disease: Secondary | ICD-10-CM | POA: Diagnosis not present

## 2021-07-22 DIAGNOSIS — D509 Iron deficiency anemia, unspecified: Secondary | ICD-10-CM | POA: Diagnosis not present

## 2021-07-22 DIAGNOSIS — E559 Vitamin D deficiency, unspecified: Secondary | ICD-10-CM | POA: Diagnosis not present

## 2021-07-22 DIAGNOSIS — N186 End stage renal disease: Secondary | ICD-10-CM | POA: Diagnosis not present

## 2021-07-22 DIAGNOSIS — Z992 Dependence on renal dialysis: Secondary | ICD-10-CM | POA: Diagnosis not present

## 2021-07-23 DIAGNOSIS — D631 Anemia in chronic kidney disease: Secondary | ICD-10-CM | POA: Diagnosis not present

## 2021-07-23 DIAGNOSIS — Z992 Dependence on renal dialysis: Secondary | ICD-10-CM | POA: Diagnosis not present

## 2021-07-23 DIAGNOSIS — N186 End stage renal disease: Secondary | ICD-10-CM | POA: Diagnosis not present

## 2021-07-23 DIAGNOSIS — N2581 Secondary hyperparathyroidism of renal origin: Secondary | ICD-10-CM | POA: Diagnosis not present

## 2021-07-23 DIAGNOSIS — D509 Iron deficiency anemia, unspecified: Secondary | ICD-10-CM | POA: Diagnosis not present

## 2021-07-23 DIAGNOSIS — N250 Renal osteodystrophy: Secondary | ICD-10-CM | POA: Diagnosis not present

## 2021-07-24 DIAGNOSIS — D631 Anemia in chronic kidney disease: Secondary | ICD-10-CM | POA: Diagnosis not present

## 2021-07-24 DIAGNOSIS — D509 Iron deficiency anemia, unspecified: Secondary | ICD-10-CM | POA: Diagnosis not present

## 2021-07-24 DIAGNOSIS — Z992 Dependence on renal dialysis: Secondary | ICD-10-CM | POA: Diagnosis not present

## 2021-07-24 DIAGNOSIS — N186 End stage renal disease: Secondary | ICD-10-CM | POA: Diagnosis not present

## 2021-07-24 DIAGNOSIS — N2581 Secondary hyperparathyroidism of renal origin: Secondary | ICD-10-CM | POA: Diagnosis not present

## 2021-07-24 DIAGNOSIS — N250 Renal osteodystrophy: Secondary | ICD-10-CM | POA: Diagnosis not present

## 2021-07-25 DIAGNOSIS — Z992 Dependence on renal dialysis: Secondary | ICD-10-CM | POA: Diagnosis not present

## 2021-07-25 DIAGNOSIS — D631 Anemia in chronic kidney disease: Secondary | ICD-10-CM | POA: Diagnosis not present

## 2021-07-25 DIAGNOSIS — D509 Iron deficiency anemia, unspecified: Secondary | ICD-10-CM | POA: Diagnosis not present

## 2021-07-25 DIAGNOSIS — N250 Renal osteodystrophy: Secondary | ICD-10-CM | POA: Diagnosis not present

## 2021-07-25 DIAGNOSIS — N186 End stage renal disease: Secondary | ICD-10-CM | POA: Diagnosis not present

## 2021-07-25 DIAGNOSIS — N2581 Secondary hyperparathyroidism of renal origin: Secondary | ICD-10-CM | POA: Diagnosis not present

## 2021-07-26 DIAGNOSIS — D631 Anemia in chronic kidney disease: Secondary | ICD-10-CM | POA: Diagnosis not present

## 2021-07-26 DIAGNOSIS — Z992 Dependence on renal dialysis: Secondary | ICD-10-CM | POA: Diagnosis not present

## 2021-07-26 DIAGNOSIS — N186 End stage renal disease: Secondary | ICD-10-CM | POA: Diagnosis not present

## 2021-07-26 DIAGNOSIS — N2581 Secondary hyperparathyroidism of renal origin: Secondary | ICD-10-CM | POA: Diagnosis not present

## 2021-07-26 DIAGNOSIS — N250 Renal osteodystrophy: Secondary | ICD-10-CM | POA: Diagnosis not present

## 2021-07-26 DIAGNOSIS — D509 Iron deficiency anemia, unspecified: Secondary | ICD-10-CM | POA: Diagnosis not present

## 2021-07-27 DIAGNOSIS — D631 Anemia in chronic kidney disease: Secondary | ICD-10-CM | POA: Diagnosis not present

## 2021-07-27 DIAGNOSIS — N186 End stage renal disease: Secondary | ICD-10-CM | POA: Diagnosis not present

## 2021-07-27 DIAGNOSIS — N2581 Secondary hyperparathyroidism of renal origin: Secondary | ICD-10-CM | POA: Diagnosis not present

## 2021-07-27 DIAGNOSIS — Z992 Dependence on renal dialysis: Secondary | ICD-10-CM | POA: Diagnosis not present

## 2021-07-27 DIAGNOSIS — D509 Iron deficiency anemia, unspecified: Secondary | ICD-10-CM | POA: Diagnosis not present

## 2021-07-27 DIAGNOSIS — N250 Renal osteodystrophy: Secondary | ICD-10-CM | POA: Diagnosis not present

## 2021-07-28 DIAGNOSIS — N250 Renal osteodystrophy: Secondary | ICD-10-CM | POA: Diagnosis not present

## 2021-07-28 DIAGNOSIS — I2510 Atherosclerotic heart disease of native coronary artery without angina pectoris: Secondary | ICD-10-CM | POA: Diagnosis not present

## 2021-07-28 DIAGNOSIS — E1142 Type 2 diabetes mellitus with diabetic polyneuropathy: Secondary | ICD-10-CM | POA: Diagnosis not present

## 2021-07-28 DIAGNOSIS — N186 End stage renal disease: Secondary | ICD-10-CM | POA: Diagnosis not present

## 2021-07-28 DIAGNOSIS — I10 Essential (primary) hypertension: Secondary | ICD-10-CM | POA: Diagnosis not present

## 2021-07-28 DIAGNOSIS — N2581 Secondary hyperparathyroidism of renal origin: Secondary | ICD-10-CM | POA: Diagnosis not present

## 2021-07-28 DIAGNOSIS — D509 Iron deficiency anemia, unspecified: Secondary | ICD-10-CM | POA: Diagnosis not present

## 2021-07-28 DIAGNOSIS — I700 Atherosclerosis of aorta: Secondary | ICD-10-CM | POA: Diagnosis not present

## 2021-07-28 DIAGNOSIS — Z992 Dependence on renal dialysis: Secondary | ICD-10-CM | POA: Diagnosis not present

## 2021-07-28 DIAGNOSIS — D631 Anemia in chronic kidney disease: Secondary | ICD-10-CM | POA: Diagnosis not present

## 2021-07-29 DIAGNOSIS — D631 Anemia in chronic kidney disease: Secondary | ICD-10-CM | POA: Diagnosis not present

## 2021-07-29 DIAGNOSIS — N250 Renal osteodystrophy: Secondary | ICD-10-CM | POA: Diagnosis not present

## 2021-07-29 DIAGNOSIS — Z992 Dependence on renal dialysis: Secondary | ICD-10-CM | POA: Diagnosis not present

## 2021-07-29 DIAGNOSIS — N186 End stage renal disease: Secondary | ICD-10-CM | POA: Diagnosis not present

## 2021-07-29 DIAGNOSIS — N2581 Secondary hyperparathyroidism of renal origin: Secondary | ICD-10-CM | POA: Diagnosis not present

## 2021-07-29 DIAGNOSIS — D509 Iron deficiency anemia, unspecified: Secondary | ICD-10-CM | POA: Diagnosis not present

## 2021-07-30 DIAGNOSIS — N2581 Secondary hyperparathyroidism of renal origin: Secondary | ICD-10-CM | POA: Diagnosis not present

## 2021-07-30 DIAGNOSIS — N250 Renal osteodystrophy: Secondary | ICD-10-CM | POA: Diagnosis not present

## 2021-07-30 DIAGNOSIS — N186 End stage renal disease: Secondary | ICD-10-CM | POA: Diagnosis not present

## 2021-07-30 DIAGNOSIS — D509 Iron deficiency anemia, unspecified: Secondary | ICD-10-CM | POA: Diagnosis not present

## 2021-07-30 DIAGNOSIS — D631 Anemia in chronic kidney disease: Secondary | ICD-10-CM | POA: Diagnosis not present

## 2021-07-30 DIAGNOSIS — Z992 Dependence on renal dialysis: Secondary | ICD-10-CM | POA: Diagnosis not present

## 2021-07-31 DIAGNOSIS — D509 Iron deficiency anemia, unspecified: Secondary | ICD-10-CM | POA: Diagnosis not present

## 2021-07-31 DIAGNOSIS — N186 End stage renal disease: Secondary | ICD-10-CM | POA: Diagnosis not present

## 2021-07-31 DIAGNOSIS — Z992 Dependence on renal dialysis: Secondary | ICD-10-CM | POA: Diagnosis not present

## 2021-07-31 DIAGNOSIS — N2581 Secondary hyperparathyroidism of renal origin: Secondary | ICD-10-CM | POA: Diagnosis not present

## 2021-07-31 DIAGNOSIS — N250 Renal osteodystrophy: Secondary | ICD-10-CM | POA: Diagnosis not present

## 2021-07-31 DIAGNOSIS — D631 Anemia in chronic kidney disease: Secondary | ICD-10-CM | POA: Diagnosis not present

## 2021-08-01 DIAGNOSIS — Z992 Dependence on renal dialysis: Secondary | ICD-10-CM | POA: Diagnosis not present

## 2021-08-01 DIAGNOSIS — D631 Anemia in chronic kidney disease: Secondary | ICD-10-CM | POA: Diagnosis not present

## 2021-08-01 DIAGNOSIS — N186 End stage renal disease: Secondary | ICD-10-CM | POA: Diagnosis not present

## 2021-08-01 DIAGNOSIS — N250 Renal osteodystrophy: Secondary | ICD-10-CM | POA: Diagnosis not present

## 2021-08-01 DIAGNOSIS — D509 Iron deficiency anemia, unspecified: Secondary | ICD-10-CM | POA: Diagnosis not present

## 2021-08-01 DIAGNOSIS — N2581 Secondary hyperparathyroidism of renal origin: Secondary | ICD-10-CM | POA: Diagnosis not present

## 2021-08-02 DIAGNOSIS — D631 Anemia in chronic kidney disease: Secondary | ICD-10-CM | POA: Diagnosis not present

## 2021-08-02 DIAGNOSIS — E119 Type 2 diabetes mellitus without complications: Secondary | ICD-10-CM | POA: Diagnosis not present

## 2021-08-02 DIAGNOSIS — N186 End stage renal disease: Secondary | ICD-10-CM | POA: Diagnosis not present

## 2021-08-02 DIAGNOSIS — N2581 Secondary hyperparathyroidism of renal origin: Secondary | ICD-10-CM | POA: Diagnosis not present

## 2021-08-02 DIAGNOSIS — D509 Iron deficiency anemia, unspecified: Secondary | ICD-10-CM | POA: Diagnosis not present

## 2021-08-02 DIAGNOSIS — N250 Renal osteodystrophy: Secondary | ICD-10-CM | POA: Diagnosis not present

## 2021-08-02 DIAGNOSIS — Z794 Long term (current) use of insulin: Secondary | ICD-10-CM | POA: Diagnosis not present

## 2021-08-02 DIAGNOSIS — Z992 Dependence on renal dialysis: Secondary | ICD-10-CM | POA: Diagnosis not present

## 2021-08-03 DIAGNOSIS — D631 Anemia in chronic kidney disease: Secondary | ICD-10-CM | POA: Diagnosis not present

## 2021-08-03 DIAGNOSIS — N2581 Secondary hyperparathyroidism of renal origin: Secondary | ICD-10-CM | POA: Diagnosis not present

## 2021-08-03 DIAGNOSIS — Z992 Dependence on renal dialysis: Secondary | ICD-10-CM | POA: Diagnosis not present

## 2021-08-03 DIAGNOSIS — N186 End stage renal disease: Secondary | ICD-10-CM | POA: Diagnosis not present

## 2021-08-03 DIAGNOSIS — D509 Iron deficiency anemia, unspecified: Secondary | ICD-10-CM | POA: Diagnosis not present

## 2021-08-03 DIAGNOSIS — N250 Renal osteodystrophy: Secondary | ICD-10-CM | POA: Diagnosis not present

## 2021-08-04 DIAGNOSIS — E1142 Type 2 diabetes mellitus with diabetic polyneuropathy: Secondary | ICD-10-CM | POA: Diagnosis not present

## 2021-08-04 DIAGNOSIS — Z992 Dependence on renal dialysis: Secondary | ICD-10-CM | POA: Diagnosis not present

## 2021-08-04 DIAGNOSIS — K219 Gastro-esophageal reflux disease without esophagitis: Secondary | ICD-10-CM | POA: Diagnosis not present

## 2021-08-04 DIAGNOSIS — Z951 Presence of aortocoronary bypass graft: Secondary | ICD-10-CM | POA: Diagnosis not present

## 2021-08-04 DIAGNOSIS — E1159 Type 2 diabetes mellitus with other circulatory complications: Secondary | ICD-10-CM | POA: Diagnosis not present

## 2021-08-04 DIAGNOSIS — Z125 Encounter for screening for malignant neoplasm of prostate: Secondary | ICD-10-CM | POA: Diagnosis not present

## 2021-08-04 DIAGNOSIS — N186 End stage renal disease: Secondary | ICD-10-CM | POA: Diagnosis not present

## 2021-08-04 DIAGNOSIS — I700 Atherosclerosis of aorta: Secondary | ICD-10-CM | POA: Diagnosis not present

## 2021-08-04 DIAGNOSIS — D509 Iron deficiency anemia, unspecified: Secondary | ICD-10-CM | POA: Diagnosis not present

## 2021-08-04 DIAGNOSIS — Z87891 Personal history of nicotine dependence: Secondary | ICD-10-CM | POA: Diagnosis not present

## 2021-08-04 DIAGNOSIS — N2581 Secondary hyperparathyroidism of renal origin: Secondary | ICD-10-CM | POA: Diagnosis not present

## 2021-08-04 DIAGNOSIS — I120 Hypertensive chronic kidney disease with stage 5 chronic kidney disease or end stage renal disease: Secondary | ICD-10-CM | POA: Diagnosis not present

## 2021-08-04 DIAGNOSIS — Z794 Long term (current) use of insulin: Secondary | ICD-10-CM | POA: Diagnosis not present

## 2021-08-04 DIAGNOSIS — D638 Anemia in other chronic diseases classified elsewhere: Secondary | ICD-10-CM | POA: Diagnosis not present

## 2021-08-04 DIAGNOSIS — Z7682 Awaiting organ transplant status: Secondary | ICD-10-CM | POA: Diagnosis not present

## 2021-08-04 DIAGNOSIS — I2510 Atherosclerotic heart disease of native coronary artery without angina pectoris: Secondary | ICD-10-CM | POA: Diagnosis not present

## 2021-08-04 DIAGNOSIS — Z1159 Encounter for screening for other viral diseases: Secondary | ICD-10-CM | POA: Diagnosis not present

## 2021-08-04 DIAGNOSIS — Z85828 Personal history of other malignant neoplasm of skin: Secondary | ICD-10-CM | POA: Diagnosis not present

## 2021-08-04 DIAGNOSIS — Z955 Presence of coronary angioplasty implant and graft: Secondary | ICD-10-CM | POA: Diagnosis not present

## 2021-08-04 DIAGNOSIS — Z114 Encounter for screening for human immunodeficiency virus [HIV]: Secondary | ICD-10-CM | POA: Diagnosis not present

## 2021-08-04 DIAGNOSIS — N250 Renal osteodystrophy: Secondary | ICD-10-CM | POA: Diagnosis not present

## 2021-08-04 DIAGNOSIS — E785 Hyperlipidemia, unspecified: Secondary | ICD-10-CM | POA: Diagnosis not present

## 2021-08-04 DIAGNOSIS — E1121 Type 2 diabetes mellitus with diabetic nephropathy: Secondary | ICD-10-CM | POA: Diagnosis not present

## 2021-08-04 DIAGNOSIS — E1122 Type 2 diabetes mellitus with diabetic chronic kidney disease: Secondary | ICD-10-CM | POA: Diagnosis not present

## 2021-08-04 DIAGNOSIS — Z79899 Other long term (current) drug therapy: Secondary | ICD-10-CM | POA: Diagnosis not present

## 2021-08-04 DIAGNOSIS — D631 Anemia in chronic kidney disease: Secondary | ICD-10-CM | POA: Diagnosis not present

## 2021-08-04 DIAGNOSIS — E0580 Other thyrotoxicosis without thyrotoxic crisis or storm: Secondary | ICD-10-CM | POA: Diagnosis not present

## 2021-08-05 DIAGNOSIS — N2581 Secondary hyperparathyroidism of renal origin: Secondary | ICD-10-CM | POA: Diagnosis not present

## 2021-08-05 DIAGNOSIS — N250 Renal osteodystrophy: Secondary | ICD-10-CM | POA: Diagnosis not present

## 2021-08-05 DIAGNOSIS — D631 Anemia in chronic kidney disease: Secondary | ICD-10-CM | POA: Diagnosis not present

## 2021-08-05 DIAGNOSIS — Z992 Dependence on renal dialysis: Secondary | ICD-10-CM | POA: Diagnosis not present

## 2021-08-05 DIAGNOSIS — D509 Iron deficiency anemia, unspecified: Secondary | ICD-10-CM | POA: Diagnosis not present

## 2021-08-05 DIAGNOSIS — N186 End stage renal disease: Secondary | ICD-10-CM | POA: Diagnosis not present

## 2021-08-06 DIAGNOSIS — D631 Anemia in chronic kidney disease: Secondary | ICD-10-CM | POA: Diagnosis not present

## 2021-08-06 DIAGNOSIS — Z992 Dependence on renal dialysis: Secondary | ICD-10-CM | POA: Diagnosis not present

## 2021-08-06 DIAGNOSIS — N186 End stage renal disease: Secondary | ICD-10-CM | POA: Diagnosis not present

## 2021-08-06 DIAGNOSIS — N2581 Secondary hyperparathyroidism of renal origin: Secondary | ICD-10-CM | POA: Diagnosis not present

## 2021-08-06 DIAGNOSIS — N250 Renal osteodystrophy: Secondary | ICD-10-CM | POA: Diagnosis not present

## 2021-08-06 DIAGNOSIS — D509 Iron deficiency anemia, unspecified: Secondary | ICD-10-CM | POA: Diagnosis not present

## 2021-08-07 DIAGNOSIS — D631 Anemia in chronic kidney disease: Secondary | ICD-10-CM | POA: Diagnosis not present

## 2021-08-07 DIAGNOSIS — N2581 Secondary hyperparathyroidism of renal origin: Secondary | ICD-10-CM | POA: Diagnosis not present

## 2021-08-07 DIAGNOSIS — Z992 Dependence on renal dialysis: Secondary | ICD-10-CM | POA: Diagnosis not present

## 2021-08-07 DIAGNOSIS — N250 Renal osteodystrophy: Secondary | ICD-10-CM | POA: Diagnosis not present

## 2021-08-07 DIAGNOSIS — N186 End stage renal disease: Secondary | ICD-10-CM | POA: Diagnosis not present

## 2021-08-07 DIAGNOSIS — D509 Iron deficiency anemia, unspecified: Secondary | ICD-10-CM | POA: Diagnosis not present

## 2021-08-08 DIAGNOSIS — D631 Anemia in chronic kidney disease: Secondary | ICD-10-CM | POA: Diagnosis not present

## 2021-08-08 DIAGNOSIS — N186 End stage renal disease: Secondary | ICD-10-CM | POA: Diagnosis not present

## 2021-08-08 DIAGNOSIS — N2581 Secondary hyperparathyroidism of renal origin: Secondary | ICD-10-CM | POA: Diagnosis not present

## 2021-08-08 DIAGNOSIS — D509 Iron deficiency anemia, unspecified: Secondary | ICD-10-CM | POA: Diagnosis not present

## 2021-08-08 DIAGNOSIS — Z992 Dependence on renal dialysis: Secondary | ICD-10-CM | POA: Diagnosis not present

## 2021-08-08 DIAGNOSIS — N250 Renal osteodystrophy: Secondary | ICD-10-CM | POA: Diagnosis not present

## 2021-08-09 DIAGNOSIS — Z992 Dependence on renal dialysis: Secondary | ICD-10-CM | POA: Diagnosis not present

## 2021-08-09 DIAGNOSIS — N186 End stage renal disease: Secondary | ICD-10-CM | POA: Diagnosis not present

## 2021-08-09 DIAGNOSIS — N250 Renal osteodystrophy: Secondary | ICD-10-CM | POA: Diagnosis not present

## 2021-08-09 DIAGNOSIS — D631 Anemia in chronic kidney disease: Secondary | ICD-10-CM | POA: Diagnosis not present

## 2021-08-09 DIAGNOSIS — N2581 Secondary hyperparathyroidism of renal origin: Secondary | ICD-10-CM | POA: Diagnosis not present

## 2021-08-09 DIAGNOSIS — D509 Iron deficiency anemia, unspecified: Secondary | ICD-10-CM | POA: Diagnosis not present

## 2021-08-10 DIAGNOSIS — D509 Iron deficiency anemia, unspecified: Secondary | ICD-10-CM | POA: Diagnosis not present

## 2021-08-10 DIAGNOSIS — N2581 Secondary hyperparathyroidism of renal origin: Secondary | ICD-10-CM | POA: Diagnosis not present

## 2021-08-10 DIAGNOSIS — D631 Anemia in chronic kidney disease: Secondary | ICD-10-CM | POA: Diagnosis not present

## 2021-08-10 DIAGNOSIS — N186 End stage renal disease: Secondary | ICD-10-CM | POA: Diagnosis not present

## 2021-08-10 DIAGNOSIS — N250 Renal osteodystrophy: Secondary | ICD-10-CM | POA: Diagnosis not present

## 2021-08-10 DIAGNOSIS — Z992 Dependence on renal dialysis: Secondary | ICD-10-CM | POA: Diagnosis not present

## 2021-08-11 DIAGNOSIS — D631 Anemia in chronic kidney disease: Secondary | ICD-10-CM | POA: Diagnosis not present

## 2021-08-11 DIAGNOSIS — N2581 Secondary hyperparathyroidism of renal origin: Secondary | ICD-10-CM | POA: Diagnosis not present

## 2021-08-11 DIAGNOSIS — N186 End stage renal disease: Secondary | ICD-10-CM | POA: Diagnosis not present

## 2021-08-11 DIAGNOSIS — N250 Renal osteodystrophy: Secondary | ICD-10-CM | POA: Diagnosis not present

## 2021-08-11 DIAGNOSIS — D509 Iron deficiency anemia, unspecified: Secondary | ICD-10-CM | POA: Diagnosis not present

## 2021-08-11 DIAGNOSIS — Z992 Dependence on renal dialysis: Secondary | ICD-10-CM | POA: Diagnosis not present

## 2021-08-12 DIAGNOSIS — Z992 Dependence on renal dialysis: Secondary | ICD-10-CM | POA: Diagnosis not present

## 2021-08-12 DIAGNOSIS — D631 Anemia in chronic kidney disease: Secondary | ICD-10-CM | POA: Diagnosis not present

## 2021-08-12 DIAGNOSIS — N2581 Secondary hyperparathyroidism of renal origin: Secondary | ICD-10-CM | POA: Diagnosis not present

## 2021-08-12 DIAGNOSIS — N250 Renal osteodystrophy: Secondary | ICD-10-CM | POA: Diagnosis not present

## 2021-08-12 DIAGNOSIS — D509 Iron deficiency anemia, unspecified: Secondary | ICD-10-CM | POA: Diagnosis not present

## 2021-08-12 DIAGNOSIS — N186 End stage renal disease: Secondary | ICD-10-CM | POA: Diagnosis not present

## 2021-08-13 DIAGNOSIS — N250 Renal osteodystrophy: Secondary | ICD-10-CM | POA: Diagnosis not present

## 2021-08-13 DIAGNOSIS — N2581 Secondary hyperparathyroidism of renal origin: Secondary | ICD-10-CM | POA: Diagnosis not present

## 2021-08-13 DIAGNOSIS — N186 End stage renal disease: Secondary | ICD-10-CM | POA: Diagnosis not present

## 2021-08-13 DIAGNOSIS — D631 Anemia in chronic kidney disease: Secondary | ICD-10-CM | POA: Diagnosis not present

## 2021-08-13 DIAGNOSIS — Z992 Dependence on renal dialysis: Secondary | ICD-10-CM | POA: Diagnosis not present

## 2021-08-13 DIAGNOSIS — D509 Iron deficiency anemia, unspecified: Secondary | ICD-10-CM | POA: Diagnosis not present

## 2021-08-14 DIAGNOSIS — Z992 Dependence on renal dialysis: Secondary | ICD-10-CM | POA: Diagnosis not present

## 2021-08-14 DIAGNOSIS — N250 Renal osteodystrophy: Secondary | ICD-10-CM | POA: Diagnosis not present

## 2021-08-14 DIAGNOSIS — N2581 Secondary hyperparathyroidism of renal origin: Secondary | ICD-10-CM | POA: Diagnosis not present

## 2021-08-14 DIAGNOSIS — N186 End stage renal disease: Secondary | ICD-10-CM | POA: Diagnosis not present

## 2021-08-14 DIAGNOSIS — D631 Anemia in chronic kidney disease: Secondary | ICD-10-CM | POA: Diagnosis not present

## 2021-08-14 DIAGNOSIS — D509 Iron deficiency anemia, unspecified: Secondary | ICD-10-CM | POA: Diagnosis not present

## 2021-08-15 DIAGNOSIS — N186 End stage renal disease: Secondary | ICD-10-CM | POA: Diagnosis not present

## 2021-08-15 DIAGNOSIS — N250 Renal osteodystrophy: Secondary | ICD-10-CM | POA: Diagnosis not present

## 2021-08-15 DIAGNOSIS — Z992 Dependence on renal dialysis: Secondary | ICD-10-CM | POA: Diagnosis not present

## 2021-08-15 DIAGNOSIS — D509 Iron deficiency anemia, unspecified: Secondary | ICD-10-CM | POA: Diagnosis not present

## 2021-08-15 DIAGNOSIS — N2581 Secondary hyperparathyroidism of renal origin: Secondary | ICD-10-CM | POA: Diagnosis not present

## 2021-08-15 DIAGNOSIS — D631 Anemia in chronic kidney disease: Secondary | ICD-10-CM | POA: Diagnosis not present

## 2021-08-16 DIAGNOSIS — D631 Anemia in chronic kidney disease: Secondary | ICD-10-CM | POA: Diagnosis not present

## 2021-08-16 DIAGNOSIS — Z992 Dependence on renal dialysis: Secondary | ICD-10-CM | POA: Diagnosis not present

## 2021-08-16 DIAGNOSIS — N186 End stage renal disease: Secondary | ICD-10-CM | POA: Diagnosis not present

## 2021-08-16 DIAGNOSIS — N2581 Secondary hyperparathyroidism of renal origin: Secondary | ICD-10-CM | POA: Diagnosis not present

## 2021-08-16 DIAGNOSIS — N250 Renal osteodystrophy: Secondary | ICD-10-CM | POA: Diagnosis not present

## 2021-08-16 DIAGNOSIS — D509 Iron deficiency anemia, unspecified: Secondary | ICD-10-CM | POA: Diagnosis not present

## 2021-08-17 DIAGNOSIS — N2581 Secondary hyperparathyroidism of renal origin: Secondary | ICD-10-CM | POA: Diagnosis not present

## 2021-08-17 DIAGNOSIS — Z992 Dependence on renal dialysis: Secondary | ICD-10-CM | POA: Diagnosis not present

## 2021-08-17 DIAGNOSIS — D509 Iron deficiency anemia, unspecified: Secondary | ICD-10-CM | POA: Diagnosis not present

## 2021-08-17 DIAGNOSIS — D631 Anemia in chronic kidney disease: Secondary | ICD-10-CM | POA: Diagnosis not present

## 2021-08-17 DIAGNOSIS — N250 Renal osteodystrophy: Secondary | ICD-10-CM | POA: Diagnosis not present

## 2021-08-17 DIAGNOSIS — N186 End stage renal disease: Secondary | ICD-10-CM | POA: Diagnosis not present

## 2021-08-18 DIAGNOSIS — D509 Iron deficiency anemia, unspecified: Secondary | ICD-10-CM | POA: Diagnosis not present

## 2021-08-18 DIAGNOSIS — Z992 Dependence on renal dialysis: Secondary | ICD-10-CM | POA: Diagnosis not present

## 2021-08-18 DIAGNOSIS — D631 Anemia in chronic kidney disease: Secondary | ICD-10-CM | POA: Diagnosis not present

## 2021-08-18 DIAGNOSIS — N250 Renal osteodystrophy: Secondary | ICD-10-CM | POA: Diagnosis not present

## 2021-08-18 DIAGNOSIS — N2581 Secondary hyperparathyroidism of renal origin: Secondary | ICD-10-CM | POA: Diagnosis not present

## 2021-08-18 DIAGNOSIS — N186 End stage renal disease: Secondary | ICD-10-CM | POA: Diagnosis not present

## 2021-08-19 DIAGNOSIS — N2581 Secondary hyperparathyroidism of renal origin: Secondary | ICD-10-CM | POA: Diagnosis not present

## 2021-08-19 DIAGNOSIS — Z992 Dependence on renal dialysis: Secondary | ICD-10-CM | POA: Diagnosis not present

## 2021-08-19 DIAGNOSIS — N250 Renal osteodystrophy: Secondary | ICD-10-CM | POA: Diagnosis not present

## 2021-08-19 DIAGNOSIS — D631 Anemia in chronic kidney disease: Secondary | ICD-10-CM | POA: Diagnosis not present

## 2021-08-19 DIAGNOSIS — N186 End stage renal disease: Secondary | ICD-10-CM | POA: Diagnosis not present

## 2021-08-19 DIAGNOSIS — D509 Iron deficiency anemia, unspecified: Secondary | ICD-10-CM | POA: Diagnosis not present

## 2021-08-20 DIAGNOSIS — Z992 Dependence on renal dialysis: Secondary | ICD-10-CM | POA: Diagnosis not present

## 2021-08-20 DIAGNOSIS — N250 Renal osteodystrophy: Secondary | ICD-10-CM | POA: Diagnosis not present

## 2021-08-20 DIAGNOSIS — N2581 Secondary hyperparathyroidism of renal origin: Secondary | ICD-10-CM | POA: Diagnosis not present

## 2021-08-20 DIAGNOSIS — N186 End stage renal disease: Secondary | ICD-10-CM | POA: Diagnosis not present

## 2021-08-20 DIAGNOSIS — D509 Iron deficiency anemia, unspecified: Secondary | ICD-10-CM | POA: Diagnosis not present

## 2021-08-20 DIAGNOSIS — D631 Anemia in chronic kidney disease: Secondary | ICD-10-CM | POA: Diagnosis not present

## 2021-08-21 DIAGNOSIS — D509 Iron deficiency anemia, unspecified: Secondary | ICD-10-CM | POA: Diagnosis not present

## 2021-08-21 DIAGNOSIS — N250 Renal osteodystrophy: Secondary | ICD-10-CM | POA: Diagnosis not present

## 2021-08-21 DIAGNOSIS — Z992 Dependence on renal dialysis: Secondary | ICD-10-CM | POA: Diagnosis not present

## 2021-08-21 DIAGNOSIS — D631 Anemia in chronic kidney disease: Secondary | ICD-10-CM | POA: Diagnosis not present

## 2021-08-21 DIAGNOSIS — N2581 Secondary hyperparathyroidism of renal origin: Secondary | ICD-10-CM | POA: Diagnosis not present

## 2021-08-21 DIAGNOSIS — N186 End stage renal disease: Secondary | ICD-10-CM | POA: Diagnosis not present

## 2021-08-22 DIAGNOSIS — Z01810 Encounter for preprocedural cardiovascular examination: Secondary | ICD-10-CM | POA: Diagnosis not present

## 2021-08-22 DIAGNOSIS — I429 Cardiomyopathy, unspecified: Secondary | ICD-10-CM | POA: Diagnosis not present

## 2021-08-22 DIAGNOSIS — R0602 Shortness of breath: Secondary | ICD-10-CM | POA: Diagnosis not present

## 2021-08-22 DIAGNOSIS — R0989 Other specified symptoms and signs involving the circulatory and respiratory systems: Secondary | ICD-10-CM | POA: Diagnosis not present

## 2021-08-22 DIAGNOSIS — Z992 Dependence on renal dialysis: Secondary | ICD-10-CM | POA: Diagnosis not present

## 2021-08-22 DIAGNOSIS — I2510 Atherosclerotic heart disease of native coronary artery without angina pectoris: Secondary | ICD-10-CM | POA: Diagnosis not present

## 2021-08-22 DIAGNOSIS — Z01818 Encounter for other preprocedural examination: Secondary | ICD-10-CM | POA: Diagnosis not present

## 2021-08-22 DIAGNOSIS — N186 End stage renal disease: Secondary | ICD-10-CM | POA: Diagnosis not present

## 2021-08-22 DIAGNOSIS — D631 Anemia in chronic kidney disease: Secondary | ICD-10-CM | POA: Diagnosis not present

## 2021-08-22 DIAGNOSIS — R931 Abnormal findings on diagnostic imaging of heart and coronary circulation: Secondary | ICD-10-CM | POA: Diagnosis not present

## 2021-08-22 DIAGNOSIS — I200 Unstable angina: Secondary | ICD-10-CM | POA: Diagnosis not present

## 2021-08-23 DIAGNOSIS — Z992 Dependence on renal dialysis: Secondary | ICD-10-CM | POA: Diagnosis not present

## 2021-08-23 DIAGNOSIS — D631 Anemia in chronic kidney disease: Secondary | ICD-10-CM | POA: Diagnosis not present

## 2021-08-23 DIAGNOSIS — N186 End stage renal disease: Secondary | ICD-10-CM | POA: Diagnosis not present

## 2021-08-24 DIAGNOSIS — N186 End stage renal disease: Secondary | ICD-10-CM | POA: Diagnosis not present

## 2021-08-24 DIAGNOSIS — D631 Anemia in chronic kidney disease: Secondary | ICD-10-CM | POA: Diagnosis not present

## 2021-08-24 DIAGNOSIS — Z992 Dependence on renal dialysis: Secondary | ICD-10-CM | POA: Diagnosis not present

## 2021-08-25 DIAGNOSIS — N186 End stage renal disease: Secondary | ICD-10-CM | POA: Diagnosis not present

## 2021-08-25 DIAGNOSIS — D631 Anemia in chronic kidney disease: Secondary | ICD-10-CM | POA: Diagnosis not present

## 2021-08-25 DIAGNOSIS — Z992 Dependence on renal dialysis: Secondary | ICD-10-CM | POA: Diagnosis not present

## 2021-08-26 DIAGNOSIS — Z992 Dependence on renal dialysis: Secondary | ICD-10-CM | POA: Diagnosis not present

## 2021-08-26 DIAGNOSIS — D631 Anemia in chronic kidney disease: Secondary | ICD-10-CM | POA: Diagnosis not present

## 2021-08-26 DIAGNOSIS — N186 End stage renal disease: Secondary | ICD-10-CM | POA: Diagnosis not present

## 2021-08-27 DIAGNOSIS — Z992 Dependence on renal dialysis: Secondary | ICD-10-CM | POA: Diagnosis not present

## 2021-08-27 DIAGNOSIS — N186 End stage renal disease: Secondary | ICD-10-CM | POA: Diagnosis not present

## 2021-08-27 DIAGNOSIS — D631 Anemia in chronic kidney disease: Secondary | ICD-10-CM | POA: Diagnosis not present

## 2021-08-28 DIAGNOSIS — D631 Anemia in chronic kidney disease: Secondary | ICD-10-CM | POA: Diagnosis not present

## 2021-08-28 DIAGNOSIS — Z992 Dependence on renal dialysis: Secondary | ICD-10-CM | POA: Diagnosis not present

## 2021-08-28 DIAGNOSIS — N186 End stage renal disease: Secondary | ICD-10-CM | POA: Diagnosis not present

## 2021-08-29 DIAGNOSIS — D631 Anemia in chronic kidney disease: Secondary | ICD-10-CM | POA: Diagnosis not present

## 2021-08-29 DIAGNOSIS — N186 End stage renal disease: Secondary | ICD-10-CM | POA: Diagnosis not present

## 2021-08-29 DIAGNOSIS — Z992 Dependence on renal dialysis: Secondary | ICD-10-CM | POA: Diagnosis not present

## 2021-08-30 DIAGNOSIS — Z992 Dependence on renal dialysis: Secondary | ICD-10-CM | POA: Diagnosis not present

## 2021-08-30 DIAGNOSIS — D631 Anemia in chronic kidney disease: Secondary | ICD-10-CM | POA: Diagnosis not present

## 2021-08-30 DIAGNOSIS — N186 End stage renal disease: Secondary | ICD-10-CM | POA: Diagnosis not present

## 2021-08-31 DIAGNOSIS — N186 End stage renal disease: Secondary | ICD-10-CM | POA: Diagnosis not present

## 2021-08-31 DIAGNOSIS — Z992 Dependence on renal dialysis: Secondary | ICD-10-CM | POA: Diagnosis not present

## 2021-08-31 DIAGNOSIS — D631 Anemia in chronic kidney disease: Secondary | ICD-10-CM | POA: Diagnosis not present

## 2021-09-01 DIAGNOSIS — D631 Anemia in chronic kidney disease: Secondary | ICD-10-CM | POA: Diagnosis not present

## 2021-09-01 DIAGNOSIS — N186 End stage renal disease: Secondary | ICD-10-CM | POA: Diagnosis not present

## 2021-09-01 DIAGNOSIS — Z992 Dependence on renal dialysis: Secondary | ICD-10-CM | POA: Diagnosis not present

## 2021-09-02 DIAGNOSIS — D631 Anemia in chronic kidney disease: Secondary | ICD-10-CM | POA: Diagnosis not present

## 2021-09-02 DIAGNOSIS — Z992 Dependence on renal dialysis: Secondary | ICD-10-CM | POA: Diagnosis not present

## 2021-09-02 DIAGNOSIS — N186 End stage renal disease: Secondary | ICD-10-CM | POA: Diagnosis not present

## 2021-09-03 DIAGNOSIS — N186 End stage renal disease: Secondary | ICD-10-CM | POA: Diagnosis not present

## 2021-09-03 DIAGNOSIS — D631 Anemia in chronic kidney disease: Secondary | ICD-10-CM | POA: Diagnosis not present

## 2021-09-03 DIAGNOSIS — Z992 Dependence on renal dialysis: Secondary | ICD-10-CM | POA: Diagnosis not present

## 2021-09-04 DIAGNOSIS — Z992 Dependence on renal dialysis: Secondary | ICD-10-CM | POA: Diagnosis not present

## 2021-09-04 DIAGNOSIS — N186 End stage renal disease: Secondary | ICD-10-CM | POA: Diagnosis not present

## 2021-09-04 DIAGNOSIS — D631 Anemia in chronic kidney disease: Secondary | ICD-10-CM | POA: Diagnosis not present

## 2021-09-05 DIAGNOSIS — D631 Anemia in chronic kidney disease: Secondary | ICD-10-CM | POA: Diagnosis not present

## 2021-09-05 DIAGNOSIS — N186 End stage renal disease: Secondary | ICD-10-CM | POA: Diagnosis not present

## 2021-09-05 DIAGNOSIS — Z992 Dependence on renal dialysis: Secondary | ICD-10-CM | POA: Diagnosis not present

## 2021-09-06 DIAGNOSIS — D631 Anemia in chronic kidney disease: Secondary | ICD-10-CM | POA: Diagnosis not present

## 2021-09-06 DIAGNOSIS — N186 End stage renal disease: Secondary | ICD-10-CM | POA: Diagnosis not present

## 2021-09-06 DIAGNOSIS — Z992 Dependence on renal dialysis: Secondary | ICD-10-CM | POA: Diagnosis not present

## 2021-09-07 DIAGNOSIS — D631 Anemia in chronic kidney disease: Secondary | ICD-10-CM | POA: Diagnosis not present

## 2021-09-07 DIAGNOSIS — N186 End stage renal disease: Secondary | ICD-10-CM | POA: Diagnosis not present

## 2021-09-07 DIAGNOSIS — Z992 Dependence on renal dialysis: Secondary | ICD-10-CM | POA: Diagnosis not present

## 2021-09-08 DIAGNOSIS — D631 Anemia in chronic kidney disease: Secondary | ICD-10-CM | POA: Diagnosis not present

## 2021-09-08 DIAGNOSIS — N186 End stage renal disease: Secondary | ICD-10-CM | POA: Diagnosis not present

## 2021-09-08 DIAGNOSIS — Z992 Dependence on renal dialysis: Secondary | ICD-10-CM | POA: Diagnosis not present

## 2021-09-09 DIAGNOSIS — D631 Anemia in chronic kidney disease: Secondary | ICD-10-CM | POA: Diagnosis not present

## 2021-09-09 DIAGNOSIS — Z992 Dependence on renal dialysis: Secondary | ICD-10-CM | POA: Diagnosis not present

## 2021-09-09 DIAGNOSIS — N186 End stage renal disease: Secondary | ICD-10-CM | POA: Diagnosis not present

## 2021-09-10 DIAGNOSIS — N186 End stage renal disease: Secondary | ICD-10-CM | POA: Diagnosis not present

## 2021-09-10 DIAGNOSIS — D631 Anemia in chronic kidney disease: Secondary | ICD-10-CM | POA: Diagnosis not present

## 2021-09-10 DIAGNOSIS — Z992 Dependence on renal dialysis: Secondary | ICD-10-CM | POA: Diagnosis not present

## 2021-09-11 DIAGNOSIS — D631 Anemia in chronic kidney disease: Secondary | ICD-10-CM | POA: Diagnosis not present

## 2021-09-11 DIAGNOSIS — Z992 Dependence on renal dialysis: Secondary | ICD-10-CM | POA: Diagnosis not present

## 2021-09-11 DIAGNOSIS — N186 End stage renal disease: Secondary | ICD-10-CM | POA: Diagnosis not present

## 2021-09-12 DIAGNOSIS — N186 End stage renal disease: Secondary | ICD-10-CM | POA: Diagnosis not present

## 2021-09-12 DIAGNOSIS — Z992 Dependence on renal dialysis: Secondary | ICD-10-CM | POA: Diagnosis not present

## 2021-09-12 DIAGNOSIS — D631 Anemia in chronic kidney disease: Secondary | ICD-10-CM | POA: Diagnosis not present

## 2021-09-13 DIAGNOSIS — D631 Anemia in chronic kidney disease: Secondary | ICD-10-CM | POA: Diagnosis not present

## 2021-09-13 DIAGNOSIS — Z992 Dependence on renal dialysis: Secondary | ICD-10-CM | POA: Diagnosis not present

## 2021-09-13 DIAGNOSIS — N186 End stage renal disease: Secondary | ICD-10-CM | POA: Diagnosis not present

## 2021-09-14 DIAGNOSIS — D631 Anemia in chronic kidney disease: Secondary | ICD-10-CM | POA: Diagnosis not present

## 2021-09-14 DIAGNOSIS — N186 End stage renal disease: Secondary | ICD-10-CM | POA: Diagnosis not present

## 2021-09-14 DIAGNOSIS — Z992 Dependence on renal dialysis: Secondary | ICD-10-CM | POA: Diagnosis not present

## 2021-09-15 DIAGNOSIS — N186 End stage renal disease: Secondary | ICD-10-CM | POA: Diagnosis not present

## 2021-09-15 DIAGNOSIS — D631 Anemia in chronic kidney disease: Secondary | ICD-10-CM | POA: Diagnosis not present

## 2021-09-15 DIAGNOSIS — Z992 Dependence on renal dialysis: Secondary | ICD-10-CM | POA: Diagnosis not present

## 2021-09-16 DIAGNOSIS — Z992 Dependence on renal dialysis: Secondary | ICD-10-CM | POA: Diagnosis not present

## 2021-09-16 DIAGNOSIS — D631 Anemia in chronic kidney disease: Secondary | ICD-10-CM | POA: Diagnosis not present

## 2021-09-16 DIAGNOSIS — N186 End stage renal disease: Secondary | ICD-10-CM | POA: Diagnosis not present

## 2021-09-17 DIAGNOSIS — Z992 Dependence on renal dialysis: Secondary | ICD-10-CM | POA: Diagnosis not present

## 2021-09-17 DIAGNOSIS — D631 Anemia in chronic kidney disease: Secondary | ICD-10-CM | POA: Diagnosis not present

## 2021-09-17 DIAGNOSIS — N186 End stage renal disease: Secondary | ICD-10-CM | POA: Diagnosis not present

## 2021-09-18 DIAGNOSIS — D631 Anemia in chronic kidney disease: Secondary | ICD-10-CM | POA: Diagnosis not present

## 2021-09-18 DIAGNOSIS — Z992 Dependence on renal dialysis: Secondary | ICD-10-CM | POA: Diagnosis not present

## 2021-09-18 DIAGNOSIS — N186 End stage renal disease: Secondary | ICD-10-CM | POA: Diagnosis not present

## 2021-09-19 DIAGNOSIS — N186 End stage renal disease: Secondary | ICD-10-CM | POA: Diagnosis not present

## 2021-09-19 DIAGNOSIS — D631 Anemia in chronic kidney disease: Secondary | ICD-10-CM | POA: Diagnosis not present

## 2021-09-19 DIAGNOSIS — Z992 Dependence on renal dialysis: Secondary | ICD-10-CM | POA: Diagnosis not present

## 2021-09-20 DIAGNOSIS — D631 Anemia in chronic kidney disease: Secondary | ICD-10-CM | POA: Diagnosis not present

## 2021-09-20 DIAGNOSIS — Z992 Dependence on renal dialysis: Secondary | ICD-10-CM | POA: Diagnosis not present

## 2021-09-20 DIAGNOSIS — N186 End stage renal disease: Secondary | ICD-10-CM | POA: Diagnosis not present

## 2021-09-21 DIAGNOSIS — Z992 Dependence on renal dialysis: Secondary | ICD-10-CM | POA: Diagnosis not present

## 2021-09-21 DIAGNOSIS — N186 End stage renal disease: Secondary | ICD-10-CM | POA: Diagnosis not present

## 2021-09-21 DIAGNOSIS — D631 Anemia in chronic kidney disease: Secondary | ICD-10-CM | POA: Diagnosis not present

## 2021-09-22 DIAGNOSIS — Z992 Dependence on renal dialysis: Secondary | ICD-10-CM | POA: Diagnosis not present

## 2021-09-22 DIAGNOSIS — N186 End stage renal disease: Secondary | ICD-10-CM | POA: Diagnosis not present

## 2021-09-22 DIAGNOSIS — D631 Anemia in chronic kidney disease: Secondary | ICD-10-CM | POA: Diagnosis not present

## 2021-09-23 DIAGNOSIS — Z992 Dependence on renal dialysis: Secondary | ICD-10-CM | POA: Diagnosis not present

## 2021-09-23 DIAGNOSIS — D631 Anemia in chronic kidney disease: Secondary | ICD-10-CM | POA: Diagnosis not present

## 2021-09-23 DIAGNOSIS — N186 End stage renal disease: Secondary | ICD-10-CM | POA: Diagnosis not present

## 2021-09-24 DIAGNOSIS — D631 Anemia in chronic kidney disease: Secondary | ICD-10-CM | POA: Diagnosis not present

## 2021-09-24 DIAGNOSIS — N186 End stage renal disease: Secondary | ICD-10-CM | POA: Diagnosis not present

## 2021-09-24 DIAGNOSIS — Z992 Dependence on renal dialysis: Secondary | ICD-10-CM | POA: Diagnosis not present

## 2021-09-25 DIAGNOSIS — N186 End stage renal disease: Secondary | ICD-10-CM | POA: Diagnosis not present

## 2021-09-25 DIAGNOSIS — D631 Anemia in chronic kidney disease: Secondary | ICD-10-CM | POA: Diagnosis not present

## 2021-09-25 DIAGNOSIS — Z992 Dependence on renal dialysis: Secondary | ICD-10-CM | POA: Diagnosis not present

## 2021-09-26 DIAGNOSIS — N186 End stage renal disease: Secondary | ICD-10-CM | POA: Diagnosis not present

## 2021-09-26 DIAGNOSIS — Z992 Dependence on renal dialysis: Secondary | ICD-10-CM | POA: Diagnosis not present

## 2021-09-26 DIAGNOSIS — D631 Anemia in chronic kidney disease: Secondary | ICD-10-CM | POA: Diagnosis not present

## 2021-09-27 DIAGNOSIS — D631 Anemia in chronic kidney disease: Secondary | ICD-10-CM | POA: Diagnosis not present

## 2021-09-27 DIAGNOSIS — N186 End stage renal disease: Secondary | ICD-10-CM | POA: Diagnosis not present

## 2021-09-27 DIAGNOSIS — Z992 Dependence on renal dialysis: Secondary | ICD-10-CM | POA: Diagnosis not present

## 2021-09-28 DIAGNOSIS — Z992 Dependence on renal dialysis: Secondary | ICD-10-CM | POA: Diagnosis not present

## 2021-09-28 DIAGNOSIS — N186 End stage renal disease: Secondary | ICD-10-CM | POA: Diagnosis not present

## 2021-09-28 DIAGNOSIS — D631 Anemia in chronic kidney disease: Secondary | ICD-10-CM | POA: Diagnosis not present

## 2021-09-29 DIAGNOSIS — N186 End stage renal disease: Secondary | ICD-10-CM | POA: Diagnosis not present

## 2021-09-29 DIAGNOSIS — Z992 Dependence on renal dialysis: Secondary | ICD-10-CM | POA: Diagnosis not present

## 2021-09-29 DIAGNOSIS — D631 Anemia in chronic kidney disease: Secondary | ICD-10-CM | POA: Diagnosis not present

## 2021-09-30 DIAGNOSIS — D631 Anemia in chronic kidney disease: Secondary | ICD-10-CM | POA: Diagnosis not present

## 2021-09-30 DIAGNOSIS — N186 End stage renal disease: Secondary | ICD-10-CM | POA: Diagnosis not present

## 2021-09-30 DIAGNOSIS — Z992 Dependence on renal dialysis: Secondary | ICD-10-CM | POA: Diagnosis not present

## 2021-10-01 DIAGNOSIS — N186 End stage renal disease: Secondary | ICD-10-CM | POA: Diagnosis not present

## 2021-10-01 DIAGNOSIS — Z992 Dependence on renal dialysis: Secondary | ICD-10-CM | POA: Diagnosis not present

## 2021-10-01 DIAGNOSIS — D631 Anemia in chronic kidney disease: Secondary | ICD-10-CM | POA: Diagnosis not present

## 2021-10-02 DIAGNOSIS — D631 Anemia in chronic kidney disease: Secondary | ICD-10-CM | POA: Diagnosis not present

## 2021-10-02 DIAGNOSIS — N186 End stage renal disease: Secondary | ICD-10-CM | POA: Diagnosis not present

## 2021-10-02 DIAGNOSIS — Z992 Dependence on renal dialysis: Secondary | ICD-10-CM | POA: Diagnosis not present

## 2021-10-03 DIAGNOSIS — Z992 Dependence on renal dialysis: Secondary | ICD-10-CM | POA: Diagnosis not present

## 2021-10-03 DIAGNOSIS — N186 End stage renal disease: Secondary | ICD-10-CM | POA: Diagnosis not present

## 2021-10-03 DIAGNOSIS — D631 Anemia in chronic kidney disease: Secondary | ICD-10-CM | POA: Diagnosis not present

## 2021-10-04 DIAGNOSIS — D631 Anemia in chronic kidney disease: Secondary | ICD-10-CM | POA: Diagnosis not present

## 2021-10-04 DIAGNOSIS — N186 End stage renal disease: Secondary | ICD-10-CM | POA: Diagnosis not present

## 2021-10-04 DIAGNOSIS — Z992 Dependence on renal dialysis: Secondary | ICD-10-CM | POA: Diagnosis not present

## 2021-10-05 DIAGNOSIS — N186 End stage renal disease: Secondary | ICD-10-CM | POA: Diagnosis not present

## 2021-10-05 DIAGNOSIS — D631 Anemia in chronic kidney disease: Secondary | ICD-10-CM | POA: Diagnosis not present

## 2021-10-05 DIAGNOSIS — Z992 Dependence on renal dialysis: Secondary | ICD-10-CM | POA: Diagnosis not present

## 2021-10-06 DIAGNOSIS — N186 End stage renal disease: Secondary | ICD-10-CM | POA: Diagnosis not present

## 2021-10-06 DIAGNOSIS — Z992 Dependence on renal dialysis: Secondary | ICD-10-CM | POA: Diagnosis not present

## 2021-10-06 DIAGNOSIS — D631 Anemia in chronic kidney disease: Secondary | ICD-10-CM | POA: Diagnosis not present

## 2021-10-07 DIAGNOSIS — Z992 Dependence on renal dialysis: Secondary | ICD-10-CM | POA: Diagnosis not present

## 2021-10-07 DIAGNOSIS — N186 End stage renal disease: Secondary | ICD-10-CM | POA: Diagnosis not present

## 2021-10-07 DIAGNOSIS — D631 Anemia in chronic kidney disease: Secondary | ICD-10-CM | POA: Diagnosis not present

## 2021-10-08 DIAGNOSIS — N186 End stage renal disease: Secondary | ICD-10-CM | POA: Diagnosis not present

## 2021-10-08 DIAGNOSIS — D631 Anemia in chronic kidney disease: Secondary | ICD-10-CM | POA: Diagnosis not present

## 2021-10-08 DIAGNOSIS — Z992 Dependence on renal dialysis: Secondary | ICD-10-CM | POA: Diagnosis not present

## 2021-10-09 DIAGNOSIS — Z992 Dependence on renal dialysis: Secondary | ICD-10-CM | POA: Diagnosis not present

## 2021-10-09 DIAGNOSIS — D631 Anemia in chronic kidney disease: Secondary | ICD-10-CM | POA: Diagnosis not present

## 2021-10-09 DIAGNOSIS — N186 End stage renal disease: Secondary | ICD-10-CM | POA: Diagnosis not present

## 2021-10-10 DIAGNOSIS — N186 End stage renal disease: Secondary | ICD-10-CM | POA: Diagnosis not present

## 2021-10-10 DIAGNOSIS — Z992 Dependence on renal dialysis: Secondary | ICD-10-CM | POA: Diagnosis not present

## 2021-10-10 DIAGNOSIS — D631 Anemia in chronic kidney disease: Secondary | ICD-10-CM | POA: Diagnosis not present

## 2021-10-11 DIAGNOSIS — Z992 Dependence on renal dialysis: Secondary | ICD-10-CM | POA: Diagnosis not present

## 2021-10-11 DIAGNOSIS — N186 End stage renal disease: Secondary | ICD-10-CM | POA: Diagnosis not present

## 2021-10-11 DIAGNOSIS — D631 Anemia in chronic kidney disease: Secondary | ICD-10-CM | POA: Diagnosis not present

## 2021-10-12 DIAGNOSIS — Z992 Dependence on renal dialysis: Secondary | ICD-10-CM | POA: Diagnosis not present

## 2021-10-12 DIAGNOSIS — N186 End stage renal disease: Secondary | ICD-10-CM | POA: Diagnosis not present

## 2021-10-12 DIAGNOSIS — D631 Anemia in chronic kidney disease: Secondary | ICD-10-CM | POA: Diagnosis not present

## 2021-10-13 DIAGNOSIS — D631 Anemia in chronic kidney disease: Secondary | ICD-10-CM | POA: Diagnosis not present

## 2021-10-13 DIAGNOSIS — N186 End stage renal disease: Secondary | ICD-10-CM | POA: Diagnosis not present

## 2021-10-13 DIAGNOSIS — Z992 Dependence on renal dialysis: Secondary | ICD-10-CM | POA: Diagnosis not present

## 2021-10-14 DIAGNOSIS — D631 Anemia in chronic kidney disease: Secondary | ICD-10-CM | POA: Diagnosis not present

## 2021-10-14 DIAGNOSIS — N186 End stage renal disease: Secondary | ICD-10-CM | POA: Diagnosis not present

## 2021-10-14 DIAGNOSIS — Z992 Dependence on renal dialysis: Secondary | ICD-10-CM | POA: Diagnosis not present

## 2021-10-15 DIAGNOSIS — N186 End stage renal disease: Secondary | ICD-10-CM | POA: Diagnosis not present

## 2021-10-15 DIAGNOSIS — Z992 Dependence on renal dialysis: Secondary | ICD-10-CM | POA: Diagnosis not present

## 2021-10-15 DIAGNOSIS — D631 Anemia in chronic kidney disease: Secondary | ICD-10-CM | POA: Diagnosis not present

## 2021-10-16 DIAGNOSIS — N186 End stage renal disease: Secondary | ICD-10-CM | POA: Diagnosis not present

## 2021-10-16 DIAGNOSIS — Z992 Dependence on renal dialysis: Secondary | ICD-10-CM | POA: Diagnosis not present

## 2021-10-16 DIAGNOSIS — D631 Anemia in chronic kidney disease: Secondary | ICD-10-CM | POA: Diagnosis not present

## 2021-10-17 DIAGNOSIS — D631 Anemia in chronic kidney disease: Secondary | ICD-10-CM | POA: Diagnosis not present

## 2021-10-17 DIAGNOSIS — Z992 Dependence on renal dialysis: Secondary | ICD-10-CM | POA: Diagnosis not present

## 2021-10-17 DIAGNOSIS — N186 End stage renal disease: Secondary | ICD-10-CM | POA: Diagnosis not present

## 2021-10-18 DIAGNOSIS — N186 End stage renal disease: Secondary | ICD-10-CM | POA: Diagnosis not present

## 2021-10-18 DIAGNOSIS — D631 Anemia in chronic kidney disease: Secondary | ICD-10-CM | POA: Diagnosis not present

## 2021-10-18 DIAGNOSIS — Z992 Dependence on renal dialysis: Secondary | ICD-10-CM | POA: Diagnosis not present

## 2021-10-19 DIAGNOSIS — D631 Anemia in chronic kidney disease: Secondary | ICD-10-CM | POA: Diagnosis not present

## 2021-10-19 DIAGNOSIS — N186 End stage renal disease: Secondary | ICD-10-CM | POA: Diagnosis not present

## 2021-10-19 DIAGNOSIS — Z992 Dependence on renal dialysis: Secondary | ICD-10-CM | POA: Diagnosis not present

## 2021-10-20 DIAGNOSIS — Z992 Dependence on renal dialysis: Secondary | ICD-10-CM | POA: Diagnosis not present

## 2021-10-20 DIAGNOSIS — D631 Anemia in chronic kidney disease: Secondary | ICD-10-CM | POA: Diagnosis not present

## 2021-10-20 DIAGNOSIS — N186 End stage renal disease: Secondary | ICD-10-CM | POA: Diagnosis not present

## 2021-10-21 DIAGNOSIS — Z992 Dependence on renal dialysis: Secondary | ICD-10-CM | POA: Diagnosis not present

## 2021-10-21 DIAGNOSIS — N186 End stage renal disease: Secondary | ICD-10-CM | POA: Diagnosis not present

## 2021-10-21 DIAGNOSIS — D631 Anemia in chronic kidney disease: Secondary | ICD-10-CM | POA: Diagnosis not present

## 2021-10-22 DIAGNOSIS — N2581 Secondary hyperparathyroidism of renal origin: Secondary | ICD-10-CM | POA: Diagnosis not present

## 2021-10-22 DIAGNOSIS — E559 Vitamin D deficiency, unspecified: Secondary | ICD-10-CM | POA: Diagnosis not present

## 2021-10-22 DIAGNOSIS — N186 End stage renal disease: Secondary | ICD-10-CM | POA: Diagnosis not present

## 2021-10-22 DIAGNOSIS — D509 Iron deficiency anemia, unspecified: Secondary | ICD-10-CM | POA: Diagnosis not present

## 2021-10-22 DIAGNOSIS — Z992 Dependence on renal dialysis: Secondary | ICD-10-CM | POA: Diagnosis not present

## 2021-10-22 DIAGNOSIS — E538 Deficiency of other specified B group vitamins: Secondary | ICD-10-CM | POA: Diagnosis not present

## 2021-10-22 DIAGNOSIS — N250 Renal osteodystrophy: Secondary | ICD-10-CM | POA: Diagnosis not present

## 2021-10-22 DIAGNOSIS — D631 Anemia in chronic kidney disease: Secondary | ICD-10-CM | POA: Diagnosis not present

## 2021-10-23 DIAGNOSIS — D631 Anemia in chronic kidney disease: Secondary | ICD-10-CM | POA: Diagnosis not present

## 2021-10-23 DIAGNOSIS — Z992 Dependence on renal dialysis: Secondary | ICD-10-CM | POA: Diagnosis not present

## 2021-10-23 DIAGNOSIS — D509 Iron deficiency anemia, unspecified: Secondary | ICD-10-CM | POA: Diagnosis not present

## 2021-10-23 DIAGNOSIS — E559 Vitamin D deficiency, unspecified: Secondary | ICD-10-CM | POA: Diagnosis not present

## 2021-10-23 DIAGNOSIS — N186 End stage renal disease: Secondary | ICD-10-CM | POA: Diagnosis not present

## 2021-10-23 DIAGNOSIS — N2581 Secondary hyperparathyroidism of renal origin: Secondary | ICD-10-CM | POA: Diagnosis not present

## 2021-10-24 DIAGNOSIS — E559 Vitamin D deficiency, unspecified: Secondary | ICD-10-CM | POA: Diagnosis not present

## 2021-10-24 DIAGNOSIS — Z992 Dependence on renal dialysis: Secondary | ICD-10-CM | POA: Diagnosis not present

## 2021-10-24 DIAGNOSIS — N2581 Secondary hyperparathyroidism of renal origin: Secondary | ICD-10-CM | POA: Diagnosis not present

## 2021-10-24 DIAGNOSIS — D509 Iron deficiency anemia, unspecified: Secondary | ICD-10-CM | POA: Diagnosis not present

## 2021-10-24 DIAGNOSIS — N186 End stage renal disease: Secondary | ICD-10-CM | POA: Diagnosis not present

## 2021-10-24 DIAGNOSIS — D631 Anemia in chronic kidney disease: Secondary | ICD-10-CM | POA: Diagnosis not present

## 2021-10-25 DIAGNOSIS — Z992 Dependence on renal dialysis: Secondary | ICD-10-CM | POA: Diagnosis not present

## 2021-10-25 DIAGNOSIS — E559 Vitamin D deficiency, unspecified: Secondary | ICD-10-CM | POA: Diagnosis not present

## 2021-10-25 DIAGNOSIS — N2581 Secondary hyperparathyroidism of renal origin: Secondary | ICD-10-CM | POA: Diagnosis not present

## 2021-10-25 DIAGNOSIS — D509 Iron deficiency anemia, unspecified: Secondary | ICD-10-CM | POA: Diagnosis not present

## 2021-10-25 DIAGNOSIS — D631 Anemia in chronic kidney disease: Secondary | ICD-10-CM | POA: Diagnosis not present

## 2021-10-25 DIAGNOSIS — N186 End stage renal disease: Secondary | ICD-10-CM | POA: Diagnosis not present

## 2021-10-26 DIAGNOSIS — Z794 Long term (current) use of insulin: Secondary | ICD-10-CM | POA: Diagnosis not present

## 2021-10-26 DIAGNOSIS — D509 Iron deficiency anemia, unspecified: Secondary | ICD-10-CM | POA: Diagnosis not present

## 2021-10-26 DIAGNOSIS — N186 End stage renal disease: Secondary | ICD-10-CM | POA: Diagnosis not present

## 2021-10-26 DIAGNOSIS — N2581 Secondary hyperparathyroidism of renal origin: Secondary | ICD-10-CM | POA: Diagnosis not present

## 2021-10-26 DIAGNOSIS — Z992 Dependence on renal dialysis: Secondary | ICD-10-CM | POA: Diagnosis not present

## 2021-10-26 DIAGNOSIS — E119 Type 2 diabetes mellitus without complications: Secondary | ICD-10-CM | POA: Diagnosis not present

## 2021-10-26 DIAGNOSIS — D631 Anemia in chronic kidney disease: Secondary | ICD-10-CM | POA: Diagnosis not present

## 2021-10-26 DIAGNOSIS — E559 Vitamin D deficiency, unspecified: Secondary | ICD-10-CM | POA: Diagnosis not present

## 2021-10-27 DIAGNOSIS — D631 Anemia in chronic kidney disease: Secondary | ICD-10-CM | POA: Diagnosis not present

## 2021-10-27 DIAGNOSIS — N186 End stage renal disease: Secondary | ICD-10-CM | POA: Diagnosis not present

## 2021-10-27 DIAGNOSIS — E559 Vitamin D deficiency, unspecified: Secondary | ICD-10-CM | POA: Diagnosis not present

## 2021-10-27 DIAGNOSIS — Z992 Dependence on renal dialysis: Secondary | ICD-10-CM | POA: Diagnosis not present

## 2021-10-27 DIAGNOSIS — D509 Iron deficiency anemia, unspecified: Secondary | ICD-10-CM | POA: Diagnosis not present

## 2021-10-27 DIAGNOSIS — N2581 Secondary hyperparathyroidism of renal origin: Secondary | ICD-10-CM | POA: Diagnosis not present

## 2021-10-28 DIAGNOSIS — D631 Anemia in chronic kidney disease: Secondary | ICD-10-CM | POA: Diagnosis not present

## 2021-10-28 DIAGNOSIS — Z992 Dependence on renal dialysis: Secondary | ICD-10-CM | POA: Diagnosis not present

## 2021-10-28 DIAGNOSIS — N2581 Secondary hyperparathyroidism of renal origin: Secondary | ICD-10-CM | POA: Diagnosis not present

## 2021-10-28 DIAGNOSIS — E559 Vitamin D deficiency, unspecified: Secondary | ICD-10-CM | POA: Diagnosis not present

## 2021-10-28 DIAGNOSIS — N186 End stage renal disease: Secondary | ICD-10-CM | POA: Diagnosis not present

## 2021-10-28 DIAGNOSIS — D509 Iron deficiency anemia, unspecified: Secondary | ICD-10-CM | POA: Diagnosis not present

## 2021-10-29 DIAGNOSIS — N186 End stage renal disease: Secondary | ICD-10-CM | POA: Diagnosis not present

## 2021-10-29 DIAGNOSIS — D631 Anemia in chronic kidney disease: Secondary | ICD-10-CM | POA: Diagnosis not present

## 2021-10-29 DIAGNOSIS — Z992 Dependence on renal dialysis: Secondary | ICD-10-CM | POA: Diagnosis not present

## 2021-10-29 DIAGNOSIS — D509 Iron deficiency anemia, unspecified: Secondary | ICD-10-CM | POA: Diagnosis not present

## 2021-10-29 DIAGNOSIS — E559 Vitamin D deficiency, unspecified: Secondary | ICD-10-CM | POA: Diagnosis not present

## 2021-10-29 DIAGNOSIS — N2581 Secondary hyperparathyroidism of renal origin: Secondary | ICD-10-CM | POA: Diagnosis not present

## 2021-10-30 DIAGNOSIS — D509 Iron deficiency anemia, unspecified: Secondary | ICD-10-CM | POA: Diagnosis not present

## 2021-10-30 DIAGNOSIS — N2581 Secondary hyperparathyroidism of renal origin: Secondary | ICD-10-CM | POA: Diagnosis not present

## 2021-10-30 DIAGNOSIS — Z992 Dependence on renal dialysis: Secondary | ICD-10-CM | POA: Diagnosis not present

## 2021-10-30 DIAGNOSIS — N186 End stage renal disease: Secondary | ICD-10-CM | POA: Diagnosis not present

## 2021-10-30 DIAGNOSIS — D631 Anemia in chronic kidney disease: Secondary | ICD-10-CM | POA: Diagnosis not present

## 2021-10-30 DIAGNOSIS — E559 Vitamin D deficiency, unspecified: Secondary | ICD-10-CM | POA: Diagnosis not present

## 2021-10-31 DIAGNOSIS — I120 Hypertensive chronic kidney disease with stage 5 chronic kidney disease or end stage renal disease: Secondary | ICD-10-CM | POA: Diagnosis not present

## 2021-10-31 DIAGNOSIS — I2510 Atherosclerotic heart disease of native coronary artery without angina pectoris: Secondary | ICD-10-CM | POA: Diagnosis not present

## 2021-10-31 DIAGNOSIS — Z01818 Encounter for other preprocedural examination: Secondary | ICD-10-CM | POA: Diagnosis not present

## 2021-10-31 DIAGNOSIS — N186 End stage renal disease: Secondary | ICD-10-CM | POA: Diagnosis not present

## 2021-10-31 DIAGNOSIS — Z794 Long term (current) use of insulin: Secondary | ICD-10-CM | POA: Diagnosis not present

## 2021-10-31 DIAGNOSIS — E559 Vitamin D deficiency, unspecified: Secondary | ICD-10-CM | POA: Diagnosis not present

## 2021-10-31 DIAGNOSIS — D509 Iron deficiency anemia, unspecified: Secondary | ICD-10-CM | POA: Diagnosis not present

## 2021-10-31 DIAGNOSIS — N2581 Secondary hyperparathyroidism of renal origin: Secondary | ICD-10-CM | POA: Diagnosis not present

## 2021-10-31 DIAGNOSIS — D631 Anemia in chronic kidney disease: Secondary | ICD-10-CM | POA: Diagnosis not present

## 2021-10-31 DIAGNOSIS — E1122 Type 2 diabetes mellitus with diabetic chronic kidney disease: Secondary | ICD-10-CM | POA: Diagnosis not present

## 2021-10-31 DIAGNOSIS — Z87891 Personal history of nicotine dependence: Secondary | ICD-10-CM | POA: Diagnosis not present

## 2021-10-31 DIAGNOSIS — Z0000 Encounter for general adult medical examination without abnormal findings: Secondary | ICD-10-CM | POA: Diagnosis not present

## 2021-10-31 DIAGNOSIS — Z992 Dependence on renal dialysis: Secondary | ICD-10-CM | POA: Diagnosis not present

## 2021-11-01 DIAGNOSIS — N2581 Secondary hyperparathyroidism of renal origin: Secondary | ICD-10-CM | POA: Diagnosis not present

## 2021-11-01 DIAGNOSIS — Z992 Dependence on renal dialysis: Secondary | ICD-10-CM | POA: Diagnosis not present

## 2021-11-01 DIAGNOSIS — N186 End stage renal disease: Secondary | ICD-10-CM | POA: Diagnosis not present

## 2021-11-01 DIAGNOSIS — D631 Anemia in chronic kidney disease: Secondary | ICD-10-CM | POA: Diagnosis not present

## 2021-11-01 DIAGNOSIS — E559 Vitamin D deficiency, unspecified: Secondary | ICD-10-CM | POA: Diagnosis not present

## 2021-11-01 DIAGNOSIS — D509 Iron deficiency anemia, unspecified: Secondary | ICD-10-CM | POA: Diagnosis not present

## 2021-11-02 DIAGNOSIS — E559 Vitamin D deficiency, unspecified: Secondary | ICD-10-CM | POA: Diagnosis not present

## 2021-11-02 DIAGNOSIS — N2581 Secondary hyperparathyroidism of renal origin: Secondary | ICD-10-CM | POA: Diagnosis not present

## 2021-11-02 DIAGNOSIS — D509 Iron deficiency anemia, unspecified: Secondary | ICD-10-CM | POA: Diagnosis not present

## 2021-11-02 DIAGNOSIS — D631 Anemia in chronic kidney disease: Secondary | ICD-10-CM | POA: Diagnosis not present

## 2021-11-02 DIAGNOSIS — Z992 Dependence on renal dialysis: Secondary | ICD-10-CM | POA: Diagnosis not present

## 2021-11-02 DIAGNOSIS — N186 End stage renal disease: Secondary | ICD-10-CM | POA: Diagnosis not present

## 2021-11-03 DIAGNOSIS — D509 Iron deficiency anemia, unspecified: Secondary | ICD-10-CM | POA: Diagnosis not present

## 2021-11-03 DIAGNOSIS — D631 Anemia in chronic kidney disease: Secondary | ICD-10-CM | POA: Diagnosis not present

## 2021-11-03 DIAGNOSIS — N186 End stage renal disease: Secondary | ICD-10-CM | POA: Diagnosis not present

## 2021-11-03 DIAGNOSIS — N2581 Secondary hyperparathyroidism of renal origin: Secondary | ICD-10-CM | POA: Diagnosis not present

## 2021-11-03 DIAGNOSIS — E559 Vitamin D deficiency, unspecified: Secondary | ICD-10-CM | POA: Diagnosis not present

## 2021-11-03 DIAGNOSIS — Z992 Dependence on renal dialysis: Secondary | ICD-10-CM | POA: Diagnosis not present

## 2021-11-04 DIAGNOSIS — N2581 Secondary hyperparathyroidism of renal origin: Secondary | ICD-10-CM | POA: Diagnosis not present

## 2021-11-04 DIAGNOSIS — D509 Iron deficiency anemia, unspecified: Secondary | ICD-10-CM | POA: Diagnosis not present

## 2021-11-04 DIAGNOSIS — E559 Vitamin D deficiency, unspecified: Secondary | ICD-10-CM | POA: Diagnosis not present

## 2021-11-04 DIAGNOSIS — D631 Anemia in chronic kidney disease: Secondary | ICD-10-CM | POA: Diagnosis not present

## 2021-11-04 DIAGNOSIS — N186 End stage renal disease: Secondary | ICD-10-CM | POA: Diagnosis not present

## 2021-11-04 DIAGNOSIS — Z992 Dependence on renal dialysis: Secondary | ICD-10-CM | POA: Diagnosis not present

## 2021-11-05 DIAGNOSIS — N186 End stage renal disease: Secondary | ICD-10-CM | POA: Diagnosis not present

## 2021-11-05 DIAGNOSIS — N2581 Secondary hyperparathyroidism of renal origin: Secondary | ICD-10-CM | POA: Diagnosis not present

## 2021-11-05 DIAGNOSIS — E559 Vitamin D deficiency, unspecified: Secondary | ICD-10-CM | POA: Diagnosis not present

## 2021-11-05 DIAGNOSIS — Z992 Dependence on renal dialysis: Secondary | ICD-10-CM | POA: Diagnosis not present

## 2021-11-05 DIAGNOSIS — D631 Anemia in chronic kidney disease: Secondary | ICD-10-CM | POA: Diagnosis not present

## 2021-11-05 DIAGNOSIS — D509 Iron deficiency anemia, unspecified: Secondary | ICD-10-CM | POA: Diagnosis not present

## 2021-11-06 DIAGNOSIS — E559 Vitamin D deficiency, unspecified: Secondary | ICD-10-CM | POA: Diagnosis not present

## 2021-11-06 DIAGNOSIS — N186 End stage renal disease: Secondary | ICD-10-CM | POA: Diagnosis not present

## 2021-11-06 DIAGNOSIS — D509 Iron deficiency anemia, unspecified: Secondary | ICD-10-CM | POA: Diagnosis not present

## 2021-11-06 DIAGNOSIS — N2581 Secondary hyperparathyroidism of renal origin: Secondary | ICD-10-CM | POA: Diagnosis not present

## 2021-11-06 DIAGNOSIS — Z992 Dependence on renal dialysis: Secondary | ICD-10-CM | POA: Diagnosis not present

## 2021-11-06 DIAGNOSIS — D631 Anemia in chronic kidney disease: Secondary | ICD-10-CM | POA: Diagnosis not present

## 2021-11-07 DIAGNOSIS — E559 Vitamin D deficiency, unspecified: Secondary | ICD-10-CM | POA: Diagnosis not present

## 2021-11-07 DIAGNOSIS — D631 Anemia in chronic kidney disease: Secondary | ICD-10-CM | POA: Diagnosis not present

## 2021-11-07 DIAGNOSIS — Z992 Dependence on renal dialysis: Secondary | ICD-10-CM | POA: Diagnosis not present

## 2021-11-07 DIAGNOSIS — N2581 Secondary hyperparathyroidism of renal origin: Secondary | ICD-10-CM | POA: Diagnosis not present

## 2021-11-07 DIAGNOSIS — D509 Iron deficiency anemia, unspecified: Secondary | ICD-10-CM | POA: Diagnosis not present

## 2021-11-07 DIAGNOSIS — N186 End stage renal disease: Secondary | ICD-10-CM | POA: Diagnosis not present

## 2021-11-08 DIAGNOSIS — D631 Anemia in chronic kidney disease: Secondary | ICD-10-CM | POA: Diagnosis not present

## 2021-11-08 DIAGNOSIS — E559 Vitamin D deficiency, unspecified: Secondary | ICD-10-CM | POA: Diagnosis not present

## 2021-11-08 DIAGNOSIS — N2581 Secondary hyperparathyroidism of renal origin: Secondary | ICD-10-CM | POA: Diagnosis not present

## 2021-11-08 DIAGNOSIS — Z992 Dependence on renal dialysis: Secondary | ICD-10-CM | POA: Diagnosis not present

## 2021-11-08 DIAGNOSIS — D509 Iron deficiency anemia, unspecified: Secondary | ICD-10-CM | POA: Diagnosis not present

## 2021-11-08 DIAGNOSIS — N186 End stage renal disease: Secondary | ICD-10-CM | POA: Diagnosis not present

## 2021-11-09 DIAGNOSIS — Z992 Dependence on renal dialysis: Secondary | ICD-10-CM | POA: Diagnosis not present

## 2021-11-09 DIAGNOSIS — E559 Vitamin D deficiency, unspecified: Secondary | ICD-10-CM | POA: Diagnosis not present

## 2021-11-09 DIAGNOSIS — D509 Iron deficiency anemia, unspecified: Secondary | ICD-10-CM | POA: Diagnosis not present

## 2021-11-09 DIAGNOSIS — D631 Anemia in chronic kidney disease: Secondary | ICD-10-CM | POA: Diagnosis not present

## 2021-11-09 DIAGNOSIS — N2581 Secondary hyperparathyroidism of renal origin: Secondary | ICD-10-CM | POA: Diagnosis not present

## 2021-11-09 DIAGNOSIS — N186 End stage renal disease: Secondary | ICD-10-CM | POA: Diagnosis not present

## 2021-11-10 DIAGNOSIS — Z992 Dependence on renal dialysis: Secondary | ICD-10-CM | POA: Diagnosis not present

## 2021-11-10 DIAGNOSIS — D509 Iron deficiency anemia, unspecified: Secondary | ICD-10-CM | POA: Diagnosis not present

## 2021-11-10 DIAGNOSIS — N186 End stage renal disease: Secondary | ICD-10-CM | POA: Diagnosis not present

## 2021-11-10 DIAGNOSIS — E559 Vitamin D deficiency, unspecified: Secondary | ICD-10-CM | POA: Diagnosis not present

## 2021-11-10 DIAGNOSIS — N2581 Secondary hyperparathyroidism of renal origin: Secondary | ICD-10-CM | POA: Diagnosis not present

## 2021-11-10 DIAGNOSIS — D631 Anemia in chronic kidney disease: Secondary | ICD-10-CM | POA: Diagnosis not present

## 2021-11-11 DIAGNOSIS — Z992 Dependence on renal dialysis: Secondary | ICD-10-CM | POA: Diagnosis not present

## 2021-11-11 DIAGNOSIS — E559 Vitamin D deficiency, unspecified: Secondary | ICD-10-CM | POA: Diagnosis not present

## 2021-11-11 DIAGNOSIS — N2581 Secondary hyperparathyroidism of renal origin: Secondary | ICD-10-CM | POA: Diagnosis not present

## 2021-11-11 DIAGNOSIS — N186 End stage renal disease: Secondary | ICD-10-CM | POA: Diagnosis not present

## 2021-11-11 DIAGNOSIS — D631 Anemia in chronic kidney disease: Secondary | ICD-10-CM | POA: Diagnosis not present

## 2021-11-11 DIAGNOSIS — D509 Iron deficiency anemia, unspecified: Secondary | ICD-10-CM | POA: Diagnosis not present

## 2021-11-12 DIAGNOSIS — D509 Iron deficiency anemia, unspecified: Secondary | ICD-10-CM | POA: Diagnosis not present

## 2021-11-12 DIAGNOSIS — N186 End stage renal disease: Secondary | ICD-10-CM | POA: Diagnosis not present

## 2021-11-12 DIAGNOSIS — E559 Vitamin D deficiency, unspecified: Secondary | ICD-10-CM | POA: Diagnosis not present

## 2021-11-12 DIAGNOSIS — D631 Anemia in chronic kidney disease: Secondary | ICD-10-CM | POA: Diagnosis not present

## 2021-11-12 DIAGNOSIS — Z992 Dependence on renal dialysis: Secondary | ICD-10-CM | POA: Diagnosis not present

## 2021-11-12 DIAGNOSIS — N2581 Secondary hyperparathyroidism of renal origin: Secondary | ICD-10-CM | POA: Diagnosis not present

## 2021-11-13 DIAGNOSIS — E559 Vitamin D deficiency, unspecified: Secondary | ICD-10-CM | POA: Diagnosis not present

## 2021-11-13 DIAGNOSIS — Z992 Dependence on renal dialysis: Secondary | ICD-10-CM | POA: Diagnosis not present

## 2021-11-13 DIAGNOSIS — D509 Iron deficiency anemia, unspecified: Secondary | ICD-10-CM | POA: Diagnosis not present

## 2021-11-13 DIAGNOSIS — D631 Anemia in chronic kidney disease: Secondary | ICD-10-CM | POA: Diagnosis not present

## 2021-11-13 DIAGNOSIS — N186 End stage renal disease: Secondary | ICD-10-CM | POA: Diagnosis not present

## 2021-11-13 DIAGNOSIS — N2581 Secondary hyperparathyroidism of renal origin: Secondary | ICD-10-CM | POA: Diagnosis not present

## 2021-11-14 DIAGNOSIS — N186 End stage renal disease: Secondary | ICD-10-CM | POA: Diagnosis not present

## 2021-11-14 DIAGNOSIS — N2581 Secondary hyperparathyroidism of renal origin: Secondary | ICD-10-CM | POA: Diagnosis not present

## 2021-11-14 DIAGNOSIS — D509 Iron deficiency anemia, unspecified: Secondary | ICD-10-CM | POA: Diagnosis not present

## 2021-11-14 DIAGNOSIS — E559 Vitamin D deficiency, unspecified: Secondary | ICD-10-CM | POA: Diagnosis not present

## 2021-11-14 DIAGNOSIS — Z992 Dependence on renal dialysis: Secondary | ICD-10-CM | POA: Diagnosis not present

## 2021-11-14 DIAGNOSIS — D631 Anemia in chronic kidney disease: Secondary | ICD-10-CM | POA: Diagnosis not present

## 2021-11-15 DIAGNOSIS — N186 End stage renal disease: Secondary | ICD-10-CM | POA: Diagnosis not present

## 2021-11-15 DIAGNOSIS — Z992 Dependence on renal dialysis: Secondary | ICD-10-CM | POA: Diagnosis not present

## 2021-11-15 DIAGNOSIS — E559 Vitamin D deficiency, unspecified: Secondary | ICD-10-CM | POA: Diagnosis not present

## 2021-11-15 DIAGNOSIS — N2581 Secondary hyperparathyroidism of renal origin: Secondary | ICD-10-CM | POA: Diagnosis not present

## 2021-11-15 DIAGNOSIS — D631 Anemia in chronic kidney disease: Secondary | ICD-10-CM | POA: Diagnosis not present

## 2021-11-15 DIAGNOSIS — D509 Iron deficiency anemia, unspecified: Secondary | ICD-10-CM | POA: Diagnosis not present

## 2021-11-16 DIAGNOSIS — D509 Iron deficiency anemia, unspecified: Secondary | ICD-10-CM | POA: Diagnosis not present

## 2021-11-16 DIAGNOSIS — N2581 Secondary hyperparathyroidism of renal origin: Secondary | ICD-10-CM | POA: Diagnosis not present

## 2021-11-16 DIAGNOSIS — Z992 Dependence on renal dialysis: Secondary | ICD-10-CM | POA: Diagnosis not present

## 2021-11-16 DIAGNOSIS — E559 Vitamin D deficiency, unspecified: Secondary | ICD-10-CM | POA: Diagnosis not present

## 2021-11-16 DIAGNOSIS — N186 End stage renal disease: Secondary | ICD-10-CM | POA: Diagnosis not present

## 2021-11-16 DIAGNOSIS — D631 Anemia in chronic kidney disease: Secondary | ICD-10-CM | POA: Diagnosis not present

## 2021-11-17 DIAGNOSIS — D509 Iron deficiency anemia, unspecified: Secondary | ICD-10-CM | POA: Diagnosis not present

## 2021-11-17 DIAGNOSIS — D631 Anemia in chronic kidney disease: Secondary | ICD-10-CM | POA: Diagnosis not present

## 2021-11-17 DIAGNOSIS — N2581 Secondary hyperparathyroidism of renal origin: Secondary | ICD-10-CM | POA: Diagnosis not present

## 2021-11-17 DIAGNOSIS — N186 End stage renal disease: Secondary | ICD-10-CM | POA: Diagnosis not present

## 2021-11-17 DIAGNOSIS — E559 Vitamin D deficiency, unspecified: Secondary | ICD-10-CM | POA: Diagnosis not present

## 2021-11-17 DIAGNOSIS — Z992 Dependence on renal dialysis: Secondary | ICD-10-CM | POA: Diagnosis not present

## 2021-11-18 DIAGNOSIS — E559 Vitamin D deficiency, unspecified: Secondary | ICD-10-CM | POA: Diagnosis not present

## 2021-11-18 DIAGNOSIS — D509 Iron deficiency anemia, unspecified: Secondary | ICD-10-CM | POA: Diagnosis not present

## 2021-11-18 DIAGNOSIS — Z992 Dependence on renal dialysis: Secondary | ICD-10-CM | POA: Diagnosis not present

## 2021-11-18 DIAGNOSIS — D631 Anemia in chronic kidney disease: Secondary | ICD-10-CM | POA: Diagnosis not present

## 2021-11-18 DIAGNOSIS — N2581 Secondary hyperparathyroidism of renal origin: Secondary | ICD-10-CM | POA: Diagnosis not present

## 2021-11-18 DIAGNOSIS — N186 End stage renal disease: Secondary | ICD-10-CM | POA: Diagnosis not present

## 2021-11-19 DIAGNOSIS — Z992 Dependence on renal dialysis: Secondary | ICD-10-CM | POA: Diagnosis not present

## 2021-11-19 DIAGNOSIS — D631 Anemia in chronic kidney disease: Secondary | ICD-10-CM | POA: Diagnosis not present

## 2021-11-19 DIAGNOSIS — N2581 Secondary hyperparathyroidism of renal origin: Secondary | ICD-10-CM | POA: Diagnosis not present

## 2021-11-19 DIAGNOSIS — E559 Vitamin D deficiency, unspecified: Secondary | ICD-10-CM | POA: Diagnosis not present

## 2021-11-19 DIAGNOSIS — N186 End stage renal disease: Secondary | ICD-10-CM | POA: Diagnosis not present

## 2021-11-19 DIAGNOSIS — D509 Iron deficiency anemia, unspecified: Secondary | ICD-10-CM | POA: Diagnosis not present

## 2021-11-20 DIAGNOSIS — N2581 Secondary hyperparathyroidism of renal origin: Secondary | ICD-10-CM | POA: Diagnosis not present

## 2021-11-20 DIAGNOSIS — E559 Vitamin D deficiency, unspecified: Secondary | ICD-10-CM | POA: Diagnosis not present

## 2021-11-20 DIAGNOSIS — N186 End stage renal disease: Secondary | ICD-10-CM | POA: Diagnosis not present

## 2021-11-20 DIAGNOSIS — Z992 Dependence on renal dialysis: Secondary | ICD-10-CM | POA: Diagnosis not present

## 2021-11-20 DIAGNOSIS — D631 Anemia in chronic kidney disease: Secondary | ICD-10-CM | POA: Diagnosis not present

## 2021-11-20 DIAGNOSIS — D509 Iron deficiency anemia, unspecified: Secondary | ICD-10-CM | POA: Diagnosis not present

## 2021-11-21 DIAGNOSIS — E559 Vitamin D deficiency, unspecified: Secondary | ICD-10-CM | POA: Diagnosis not present

## 2021-11-21 DIAGNOSIS — Z992 Dependence on renal dialysis: Secondary | ICD-10-CM | POA: Diagnosis not present

## 2021-11-21 DIAGNOSIS — D509 Iron deficiency anemia, unspecified: Secondary | ICD-10-CM | POA: Diagnosis not present

## 2021-11-21 DIAGNOSIS — N2581 Secondary hyperparathyroidism of renal origin: Secondary | ICD-10-CM | POA: Diagnosis not present

## 2021-11-21 DIAGNOSIS — N186 End stage renal disease: Secondary | ICD-10-CM | POA: Diagnosis not present

## 2021-11-21 DIAGNOSIS — D631 Anemia in chronic kidney disease: Secondary | ICD-10-CM | POA: Diagnosis not present

## 2021-11-22 ENCOUNTER — Encounter: Payer: Self-pay | Admitting: Oncology

## 2021-11-22 ENCOUNTER — Other Ambulatory Visit: Payer: Self-pay | Admitting: *Deleted

## 2021-11-22 ENCOUNTER — Inpatient Hospital Stay: Payer: Medicare Other

## 2021-11-22 ENCOUNTER — Inpatient Hospital Stay: Payer: Medicare Other | Attending: Oncology | Admitting: Oncology

## 2021-11-22 VITALS — BP 110/73 | HR 65 | Temp 97.8°F | Resp 16 | Wt 175.4 lb

## 2021-11-22 DIAGNOSIS — I200 Unstable angina: Secondary | ICD-10-CM | POA: Diagnosis not present

## 2021-11-22 DIAGNOSIS — E1122 Type 2 diabetes mellitus with diabetic chronic kidney disease: Secondary | ICD-10-CM | POA: Diagnosis not present

## 2021-11-22 DIAGNOSIS — Z992 Dependence on renal dialysis: Secondary | ICD-10-CM | POA: Diagnosis not present

## 2021-11-22 DIAGNOSIS — D509 Iron deficiency anemia, unspecified: Secondary | ICD-10-CM

## 2021-11-22 DIAGNOSIS — Z794 Long term (current) use of insulin: Secondary | ICD-10-CM | POA: Insufficient documentation

## 2021-11-22 DIAGNOSIS — D508 Other iron deficiency anemias: Secondary | ICD-10-CM

## 2021-11-22 DIAGNOSIS — Z7982 Long term (current) use of aspirin: Secondary | ICD-10-CM | POA: Insufficient documentation

## 2021-11-22 DIAGNOSIS — N186 End stage renal disease: Secondary | ICD-10-CM

## 2021-11-22 DIAGNOSIS — R634 Abnormal weight loss: Secondary | ICD-10-CM

## 2021-11-22 DIAGNOSIS — Z79899 Other long term (current) drug therapy: Secondary | ICD-10-CM | POA: Diagnosis not present

## 2021-11-22 DIAGNOSIS — I120 Hypertensive chronic kidney disease with stage 5 chronic kidney disease or end stage renal disease: Secondary | ICD-10-CM | POA: Insufficient documentation

## 2021-11-22 DIAGNOSIS — Z85828 Personal history of other malignant neoplasm of skin: Secondary | ICD-10-CM | POA: Insufficient documentation

## 2021-11-22 DIAGNOSIS — Z86010 Personal history of colonic polyps: Secondary | ICD-10-CM | POA: Diagnosis not present

## 2021-11-22 DIAGNOSIS — Z87891 Personal history of nicotine dependence: Secondary | ICD-10-CM | POA: Diagnosis not present

## 2021-11-22 DIAGNOSIS — E785 Hyperlipidemia, unspecified: Secondary | ICD-10-CM | POA: Diagnosis not present

## 2021-11-22 DIAGNOSIS — D631 Anemia in chronic kidney disease: Secondary | ICD-10-CM | POA: Diagnosis not present

## 2021-11-22 DIAGNOSIS — R9389 Abnormal findings on diagnostic imaging of other specified body structures: Secondary | ICD-10-CM

## 2021-11-22 DIAGNOSIS — K219 Gastro-esophageal reflux disease without esophagitis: Secondary | ICD-10-CM | POA: Diagnosis not present

## 2021-11-22 DIAGNOSIS — R5383 Other fatigue: Secondary | ICD-10-CM

## 2021-11-22 LAB — CBC WITH DIFFERENTIAL/PLATELET
Abs Immature Granulocytes: 0.03 10*3/uL (ref 0.00–0.07)
Basophils Absolute: 0 10*3/uL (ref 0.0–0.1)
Basophils Relative: 1 %
Eosinophils Absolute: 0.1 10*3/uL (ref 0.0–0.5)
Eosinophils Relative: 1 %
HCT: 33 % — ABNORMAL LOW (ref 39.0–52.0)
Hemoglobin: 11.1 g/dL — ABNORMAL LOW (ref 13.0–17.0)
Immature Granulocytes: 0 %
Lymphocytes Relative: 17 %
Lymphs Abs: 1.3 10*3/uL (ref 0.7–4.0)
MCH: 31 pg (ref 26.0–34.0)
MCHC: 33.6 g/dL (ref 30.0–36.0)
MCV: 92.2 fL (ref 80.0–100.0)
Monocytes Absolute: 0.7 10*3/uL (ref 0.1–1.0)
Monocytes Relative: 9 %
Neutro Abs: 5.4 10*3/uL (ref 1.7–7.7)
Neutrophils Relative %: 72 %
Platelets: 237 10*3/uL (ref 150–400)
RBC: 3.58 MIL/uL — ABNORMAL LOW (ref 4.22–5.81)
RDW: 13.8 % (ref 11.5–15.5)
WBC: 7.4 10*3/uL (ref 4.0–10.5)
nRBC: 0 % (ref 0.0–0.2)

## 2021-11-22 LAB — IRON AND TIBC
Iron: 81 ug/dL (ref 45–182)
Saturation Ratios: 37 % (ref 17.9–39.5)
TIBC: 220 ug/dL — ABNORMAL LOW (ref 250–450)
UIBC: 139 ug/dL

## 2021-11-22 LAB — COMPREHENSIVE METABOLIC PANEL
ALT: 28 U/L (ref 0–44)
AST: 31 U/L (ref 15–41)
Albumin: 2.8 g/dL — ABNORMAL LOW (ref 3.5–5.0)
Alkaline Phosphatase: 73 U/L (ref 38–126)
Anion gap: 14 (ref 5–15)
BUN: 54 mg/dL — ABNORMAL HIGH (ref 8–23)
CO2: 27 mmol/L (ref 22–32)
Calcium: 8.6 mg/dL — ABNORMAL LOW (ref 8.9–10.3)
Chloride: 93 mmol/L — ABNORMAL LOW (ref 98–111)
Creatinine, Ser: 5.65 mg/dL — ABNORMAL HIGH (ref 0.61–1.24)
GFR, Estimated: 10 mL/min — ABNORMAL LOW (ref >60)
Glucose, Bld: 223 mg/dL — ABNORMAL HIGH (ref 70–99)
Potassium: 2.8 mmol/L — ABNORMAL LOW (ref 3.5–5.1)
Sodium: 134 mmol/L — ABNORMAL LOW (ref 135–145)
Total Bilirubin: 0.4 mg/dL (ref 0.3–1.2)
Total Protein: 6.6 g/dL (ref 6.5–8.1)

## 2021-11-22 LAB — LACTATE DEHYDROGENASE: LDH: 122 U/L (ref 98–192)

## 2021-11-22 LAB — RETICULOCYTES
Immature Retic Fract: 14.1 % (ref 2.3–15.9)
RBC.: 3.65 MIL/uL — ABNORMAL LOW (ref 4.22–5.81)
Retic Count, Absolute: 66.4 10*3/uL (ref 19.0–186.0)
Retic Ct Pct: 1.8 % (ref 0.4–3.1)

## 2021-11-22 LAB — FERRITIN: Ferritin: 456 ng/mL — ABNORMAL HIGH (ref 24–336)

## 2021-11-22 LAB — VITAMIN B12: Vitamin B-12: 504 pg/mL (ref 180–914)

## 2021-11-22 NOTE — Progress Notes (Signed)
Pt was approved for a kidney transplant at [facility] which was put off due to a bicycle stress test prior and an elevated BP. Pt sees cardiology on Wednesday.

## 2021-11-22 NOTE — Progress Notes (Signed)
Hematology/Oncology Consult note [facility]  Telephone:([phone] Fax:([phone]  Patient Care Team: Leonel Ramsay, MD as PCP - General (Infectious Diseases) Bary Castilla, Forest Gleason, MD (General Surgery) Leonel Ramsay, MD (Infectious Diseases) Anthonette Legato, MD (Internal Medicine) Sindy Guadeloupe, MD as Consulting Physician (Hematology and Oncology)   Name of the patient: Roger Knight  [id number]  Jul 13, 1950   Date of visit: 11/22/21  Diagnosis-anemia of chronic kidney disease as well as component of iron deficiency  Chief complaint/ Reason for visit-reestablish follow-up for anemia and ongoing weight loss  Heme/Onc history: Patient is a 71 year old male with history ofStage III CKD who has seen me in the past for anemia which was attributed to a component of iron deficiency as well as anemia of chronic kidney disease.  He has received IV iron in the past but has never required any erythropoietin.  Last received IV iron and September 2021 and was subsequently lost to follow-up.  Patient had an EGD colonoscopy in January 2017. EGD showed a nonbleeding gastric polyp which was biopsied and colonoscopy showed internal and external hemorrhoids. The cause of his GI bleed in the past has been attributed to his hemorrhoids.Also had another colonoscopy in January 2023 which showed diverticulosis in the sigmoid colon.  5 mm polyp in the proximal descending colon.  Biopsies negative for malignancy and showed tubular adenoma.  Patient sees Dr. Holley Raring for his CKD.  He has now progressed to end-stage renal disease requiring peritoneal dialysis.  He was previously on the transplant list but was later not deemed to be a candidate for transplant after a stress test.  Interval history-patient is on peritoneal dialysis and states that he gets Venofer and EPO intermittently at [facility].  He is concerned about his ongoing weight loss.  He was around 220 pounds last  year and has drifted down to 175 pounds.  Appetite is fair to poor.  ECOG PS- 1 Pain scale- 0   Review of systems- Review of Systems  Constitutional:  Positive for malaise/fatigue and weight loss. Negative for chills and fever.  HENT:  Negative for congestion, ear discharge and nosebleeds.   Eyes:  Negative for blurred vision.  Respiratory:  Negative for cough, hemoptysis, sputum production, shortness of breath and wheezing.   Cardiovascular:  Negative for chest pain, palpitations, orthopnea and claudication.  Gastrointestinal:  Negative for abdominal pain, blood in stool, constipation, diarrhea, heartburn, melena, nausea and vomiting.  Genitourinary:  Negative for dysuria, flank pain, frequency, hematuria and urgency.  Musculoskeletal:  Negative for back pain, joint pain and myalgias.  Skin:  Negative for rash.  Neurological:  Negative for dizziness, tingling, focal weakness, seizures, weakness and headaches.  Endo/Heme/Allergies:  Does not bruise/bleed easily.  Psychiatric/Behavioral:  Negative for depression and suicidal ideas. The patient does not have insomnia.       No Known Allergies   Past Medical History:  Diagnosis Date   Accelerating angina ([place]) 02/11/2014   Anemia    Arthritis    Carcinoma ([place]) 2012    skin cancer on neck [facility]   Chronic kidney disease 2016   Diabetes mellitus without complication ([place])    GERD (gastroesophageal reflux disease)    Hemorrhoids 2016-17   Hernia    Hyperlipidemia    Hypertension    Skin cancer      Past Surgical History:  Procedure Laterality Date   COLONOSCOPY  April 2014   COLONOSCOPY N/A 05/13/2015   Procedure: COLONOSCOPY;  Surgeon: Rodman Key  Elmyra Ricks, MD;  Location: [facility] ENDOSCOPY;  Service: Endoscopy;  Laterality: N/A;   COLONOSCOPY WITH PROPOFOL N/A 05/04/2021   Procedure: COLONOSCOPY WITH PROPOFOL;  Surgeon: Robert Bellow, MD;  Location: [facility] ENDOSCOPY;  Service: Endoscopy;  Laterality: N/A;  DM   CORONARY ARTERY  BYPASS GRAFT  1996   [facility]   CORONARY STENT PLACEMENT  2011   [facility]   ESOPHAGOGASTRODUODENOSCOPY (EGD) WITH PROPOFOL N/A 05/13/2015   Procedure: ESOPHAGOGASTRODUODENOSCOPY (EGD) WITH PROPOFOL;  Surgeon: Josefine Class, MD;  Location: [facility] ENDOSCOPY;  Service: Endoscopy;  Laterality: N/A;   LEFT HEART CATH AND CORS/GRAFTS ANGIOGRAPHY N/A 01/24/2021   Procedure: LEFT HEART CATH AND CORS/GRAFTS ANGIOGRAPHY;  Surgeon: Andrez Grime, MD;  Location: [facility] CV LAB;  Service: Cardiovascular;  Laterality: N/A;   UPPER GI ENDOSCOPY  2014    Social History   Socioeconomic History   Marital status: Married    Spouse name: Not on file   Number of children: Not on file   Years of education: Not on file   Highest education level: Not on file  Occupational History   Not on file  Tobacco Use   Smoking status: Former    Types: Cigarettes    Quit date: 04/24/2005    Years since quitting: 16.5   Smokeless tobacco: Former    Types: Nurse, children's Use: Never used  Substance and Sexual Activity   Alcohol use: No   Drug use: No   Sexual activity: Yes  Other Topics Concern   Not on file  Social History Narrative   Not on file   Social Determinants of Health   Financial Resource Strain: Not on file  Food Insecurity: Not on file  Transportation Needs: Not on file  Physical Activity: Not on file  Stress: Not on file  Social Connections: Not on file  Intimate Partner Violence: Not on file    Family History  Problem Relation Age of Onset   Dementia Mother 69   Osteoarthritis Mother    Cancer Mother      Current Outpatient Medications:    amLODipine (NORVASC) 5 MG tablet, Take 5 mg by mouth daily. , Disp: , Rfl:    aspirin 81 MG tablet, Take 81 mg by mouth daily., Disp: , Rfl:    atorvastatin (LIPITOR) 80 MG tablet, Take 80 mg by mouth daily., Disp: , Rfl:    calcitRIOL (ROCALTROL) 0.25 MCG capsule, Take 0.25 mcg by mouth daily. , Disp: , Rfl:    Calcium  Polycarbophil (FIBER) 625 MG TABS, Take 2 capsules by mouth in the morning and at bedtime., Disp: , Rfl:    cetirizine (ZYRTEC) 10 MG tablet, Take 10 mg by mouth daily., Disp: , Rfl:    ezetimibe (ZETIA) 10 MG tablet, Take 10 mg by mouth daily., Disp: , Rfl:    gabapentin (NEURONTIN) 300 MG capsule, Take 300 mg by mouth at bedtime., Disp: , Rfl:    insulin aspart (NOVOLOG) 100 UNIT/ML injection, Inject 20 Units into the skin 3 (three) times daily. , Disp: , Rfl:    Insulin Pen Needle 32G X 4 MM MISC, USE WITH INJECTIONS 4 TIMES DAILY, Disp: , Rfl:    metoprolol (TOPROL-XL) 200 MG 24 hr tablet, Take 200 mg by mouth daily., Disp: , Rfl:    Multiple Vitamins-Minerals (MULTIVITAMIN WITH MINERALS) tablet, Take 1 tablet by mouth daily., Disp: , Rfl:    pantoprazole (PROTONIX) 40 MG tablet, Take 40 mg by mouth 2 (  two) times daily before a meal. , Disp: , Rfl:    sildenafil (VIAGRA) 100 MG tablet, Take 100 mg by mouth as needed for erectile dysfunction. Reported on 06/14/2015, Disp: , Rfl:    spironolactone (ALDACTONE) 25 MG tablet, Take 25 mg by mouth 2 (two) times daily., Disp: , Rfl:    TRESIBA FLEXTOUCH 200 UNIT/ML SOPN, Inject 90 Units into the skin at bedtime., Disp: , Rfl:    triamcinolone cream (KENALOG) 0.1 %, Apply 1 application topically 2 (two) times daily as needed (irritation)., Disp: , Rfl:    valsartan-hydrochlorothiazide (DIOVAN-HCT) 320-25 MG per tablet, Take 1 tablet by mouth daily., Disp: , Rfl:   Physical exam:  Vitals:   11/22/21 0840  BP: 110/73  Pulse: 65  Resp: 16  Temp: 97.8 F (36.6 C)  SpO2: 98%  Weight: 175 lb 6.4 oz (79.6 kg)   Physical Exam Constitutional:      General: He is not in acute distress. Cardiovascular:     Rate and Rhythm: Normal rate and regular rhythm.     Heart sounds: Normal heart sounds.  Pulmonary:     Effort: Pulmonary effort is normal.     Breath sounds: Normal breath sounds.  Abdominal:     General: Bowel sounds are normal.      Palpations: Abdomen is soft.  Lymphadenopathy:     Comments: No palpable cervical, supraclavicular, axillary or inguinal adenopathy    Skin:    General: Skin is warm and dry.  Neurological:     Mental Status: He is alert and oriented to person, place, and time.         Latest Ref Rng & Units 05/19/2021    5:10 AM  CMP  Glucose 70 - 99 mg/dL 139   BUN 8 - 23 mg/dL 39   Creatinine 0.61 - 1.24 mg/dL 4.33   Sodium 135 - 145 mmol/L 136   Potassium 3.5 - 5.1 mmol/L 3.8   Chloride 98 - 111 mmol/L 102   CO2 22 - 32 mmol/L 26   Calcium 8.9 - 10.3 mg/dL 9.2   Total Protein 6.5 - 8.1 g/dL 5.4   Total Bilirubin 0.3 - 1.2 mg/dL 0.5   Alkaline Phos 38 - 126 U/L 68   AST 15 - 41 U/L 19   ALT 0 - 44 U/L 22       Latest Ref Rng & Units 05/19/2021    5:10 AM  CBC  WBC 4.0 - 10.5 K/uL 5.9   Hemoglobin 13.0 - 17.0 g/dL 9.9   Hematocrit 39.0 - 52.0 % 29.8   Platelets 150 - 400 K/uL 199       Assessment and plan- Patient is a 71 y.o. male with history of anemia of chronic kidney disease currently on peritoneal dialysis referred for unintentional weight loss and anemia  Unintentional weight loss: I will see if we can get CT chest abdomen and pelvis with contrast approved by his insurance.  Given that he is already on peritoneal dialysis I do not think that giving him IV contrast would be detrimental to his kidneys any further.  However I will check this with nephrology again.  Clinically he has no palpable adenopathy.  Recent colonoscopy was normal  Anemia: We will repeat ferritin and iron studies B12 folate reticulocyte count CMP and LDH today.  I will tentatively see him back in 3 weeks time to discuss results of blood work and scans prior   Visit Diagnosis 1. Iron deficiency anemia, unspecified  iron deficiency anemia type   2. Anemia in chronic kidney disease, on chronic dialysis ([place])   3. Unintentional weight loss      Dr. Randa Evens, MD, MPH [facility] [phone] 11/22/2021 8:26 AM

## 2021-11-23 DIAGNOSIS — N184 Chronic kidney disease, stage 4 (severe): Secondary | ICD-10-CM | POA: Diagnosis not present

## 2021-11-23 DIAGNOSIS — N186 End stage renal disease: Secondary | ICD-10-CM | POA: Diagnosis not present

## 2021-11-23 DIAGNOSIS — E1142 Type 2 diabetes mellitus with diabetic polyneuropathy: Secondary | ICD-10-CM | POA: Diagnosis not present

## 2021-11-23 DIAGNOSIS — I2510 Atherosclerotic heart disease of native coronary artery without angina pectoris: Secondary | ICD-10-CM | POA: Diagnosis not present

## 2021-11-23 DIAGNOSIS — E119 Type 2 diabetes mellitus without complications: Secondary | ICD-10-CM | POA: Diagnosis not present

## 2021-11-23 DIAGNOSIS — Z794 Long term (current) use of insulin: Secondary | ICD-10-CM | POA: Diagnosis not present

## 2021-11-23 DIAGNOSIS — E1122 Type 2 diabetes mellitus with diabetic chronic kidney disease: Secondary | ICD-10-CM | POA: Diagnosis not present

## 2021-11-23 DIAGNOSIS — D509 Iron deficiency anemia, unspecified: Secondary | ICD-10-CM | POA: Diagnosis not present

## 2021-11-23 DIAGNOSIS — Z992 Dependence on renal dialysis: Secondary | ICD-10-CM | POA: Diagnosis not present

## 2021-11-23 DIAGNOSIS — D631 Anemia in chronic kidney disease: Secondary | ICD-10-CM | POA: Diagnosis not present

## 2021-11-23 DIAGNOSIS — E785 Hyperlipidemia, unspecified: Secondary | ICD-10-CM | POA: Diagnosis not present

## 2021-11-23 DIAGNOSIS — I200 Unstable angina: Secondary | ICD-10-CM | POA: Diagnosis not present

## 2021-11-23 DIAGNOSIS — I10 Essential (primary) hypertension: Secondary | ICD-10-CM | POA: Diagnosis not present

## 2021-11-23 LAB — KAPPA/LAMBDA LIGHT CHAINS
Kappa free light chain: 162.7 mg/L — ABNORMAL HIGH (ref 3.3–19.4)
Kappa, lambda light chain ratio: 1.26 (ref 0.26–1.65)
Lambda free light chains: 129.2 mg/L — ABNORMAL HIGH (ref 5.7–26.3)

## 2021-11-23 LAB — HAPTOGLOBIN: Haptoglobin: 355 mg/dL (ref 34–355)

## 2021-11-24 DIAGNOSIS — D509 Iron deficiency anemia, unspecified: Secondary | ICD-10-CM | POA: Diagnosis not present

## 2021-11-24 DIAGNOSIS — D631 Anemia in chronic kidney disease: Secondary | ICD-10-CM | POA: Diagnosis not present

## 2021-11-24 DIAGNOSIS — Z992 Dependence on renal dialysis: Secondary | ICD-10-CM | POA: Diagnosis not present

## 2021-11-24 DIAGNOSIS — N186 End stage renal disease: Secondary | ICD-10-CM | POA: Diagnosis not present

## 2021-11-24 LAB — MULTIPLE MYELOMA PANEL, SERUM
Albumin SerPl Elph-Mcnc: 2.5 g/dL — ABNORMAL LOW (ref 2.9–4.4)
Albumin/Glob SerPl: 0.8 (ref 0.7–1.7)
Alpha 1: 0.3 g/dL (ref 0.0–0.4)
Alpha2 Glob SerPl Elph-Mcnc: 1.2 g/dL — ABNORMAL HIGH (ref 0.4–1.0)
B-Globulin SerPl Elph-Mcnc: 0.7 g/dL (ref 0.7–1.3)
Gamma Glob SerPl Elph-Mcnc: 1 g/dL (ref 0.4–1.8)
Globulin, Total: 3.3 g/dL (ref 2.2–3.9)
IgA: 204 mg/dL (ref 61–437)
IgG (Immunoglobin G), Serum: 946 mg/dL (ref 603–1613)
IgM (Immunoglobulin M), Srm: 126 mg/dL (ref 15–143)
Total Protein ELP: 5.8 g/dL — ABNORMAL LOW (ref 6.0–8.5)

## 2021-11-25 DIAGNOSIS — D509 Iron deficiency anemia, unspecified: Secondary | ICD-10-CM | POA: Diagnosis not present

## 2021-11-25 DIAGNOSIS — Z992 Dependence on renal dialysis: Secondary | ICD-10-CM | POA: Diagnosis not present

## 2021-11-25 DIAGNOSIS — D631 Anemia in chronic kidney disease: Secondary | ICD-10-CM | POA: Diagnosis not present

## 2021-11-25 DIAGNOSIS — N186 End stage renal disease: Secondary | ICD-10-CM | POA: Diagnosis not present

## 2021-11-26 DIAGNOSIS — Z992 Dependence on renal dialysis: Secondary | ICD-10-CM | POA: Diagnosis not present

## 2021-11-26 DIAGNOSIS — D509 Iron deficiency anemia, unspecified: Secondary | ICD-10-CM | POA: Diagnosis not present

## 2021-11-26 DIAGNOSIS — N186 End stage renal disease: Secondary | ICD-10-CM | POA: Diagnosis not present

## 2021-11-26 DIAGNOSIS — D631 Anemia in chronic kidney disease: Secondary | ICD-10-CM | POA: Diagnosis not present

## 2021-11-27 DIAGNOSIS — D509 Iron deficiency anemia, unspecified: Secondary | ICD-10-CM | POA: Diagnosis not present

## 2021-11-27 DIAGNOSIS — D631 Anemia in chronic kidney disease: Secondary | ICD-10-CM | POA: Diagnosis not present

## 2021-11-27 DIAGNOSIS — N186 End stage renal disease: Secondary | ICD-10-CM | POA: Diagnosis not present

## 2021-11-27 DIAGNOSIS — Z992 Dependence on renal dialysis: Secondary | ICD-10-CM | POA: Diagnosis not present

## 2021-11-28 DIAGNOSIS — N186 End stage renal disease: Secondary | ICD-10-CM | POA: Diagnosis not present

## 2021-11-28 DIAGNOSIS — D631 Anemia in chronic kidney disease: Secondary | ICD-10-CM | POA: Diagnosis not present

## 2021-11-28 DIAGNOSIS — Z992 Dependence on renal dialysis: Secondary | ICD-10-CM | POA: Diagnosis not present

## 2021-11-28 DIAGNOSIS — D509 Iron deficiency anemia, unspecified: Secondary | ICD-10-CM | POA: Diagnosis not present

## 2021-11-29 DIAGNOSIS — N186 End stage renal disease: Secondary | ICD-10-CM | POA: Diagnosis not present

## 2021-11-29 DIAGNOSIS — D631 Anemia in chronic kidney disease: Secondary | ICD-10-CM | POA: Diagnosis not present

## 2021-11-29 DIAGNOSIS — D509 Iron deficiency anemia, unspecified: Secondary | ICD-10-CM | POA: Diagnosis not present

## 2021-11-29 DIAGNOSIS — Z992 Dependence on renal dialysis: Secondary | ICD-10-CM | POA: Diagnosis not present

## 2021-11-30 DIAGNOSIS — D509 Iron deficiency anemia, unspecified: Secondary | ICD-10-CM | POA: Diagnosis not present

## 2021-11-30 DIAGNOSIS — N186 End stage renal disease: Secondary | ICD-10-CM | POA: Diagnosis not present

## 2021-11-30 DIAGNOSIS — D631 Anemia in chronic kidney disease: Secondary | ICD-10-CM | POA: Diagnosis not present

## 2021-11-30 DIAGNOSIS — Z992 Dependence on renal dialysis: Secondary | ICD-10-CM | POA: Diagnosis not present

## 2021-12-01 ENCOUNTER — Ambulatory Visit
Admission: RE | Admit: 2021-12-01 | Discharge: 2021-12-01 | Disposition: A | Discharge status: Home / Self Care | Payer: Medicare Other | Source: Ambulatory Visit | Attending: Oncology | Admitting: Oncology

## 2021-12-01 DIAGNOSIS — R634 Abnormal weight loss: Secondary | ICD-10-CM | POA: Diagnosis not present

## 2021-12-01 DIAGNOSIS — R5383 Other fatigue: Secondary | ICD-10-CM | POA: Diagnosis not present

## 2021-12-01 DIAGNOSIS — D508 Other iron deficiency anemias: Secondary | ICD-10-CM | POA: Diagnosis not present

## 2021-12-01 DIAGNOSIS — I700 Atherosclerosis of aorta: Secondary | ICD-10-CM | POA: Diagnosis not present

## 2021-12-01 DIAGNOSIS — D631 Anemia in chronic kidney disease: Secondary | ICD-10-CM | POA: Diagnosis not present

## 2021-12-01 DIAGNOSIS — R9389 Abnormal findings on diagnostic imaging of other specified body structures: Secondary | ICD-10-CM | POA: Diagnosis not present

## 2021-12-01 DIAGNOSIS — J9811 Atelectasis: Secondary | ICD-10-CM | POA: Diagnosis not present

## 2021-12-01 DIAGNOSIS — Z992 Dependence on renal dialysis: Secondary | ICD-10-CM | POA: Insufficient documentation

## 2021-12-01 DIAGNOSIS — D509 Iron deficiency anemia, unspecified: Secondary | ICD-10-CM | POA: Diagnosis not present

## 2021-12-01 DIAGNOSIS — R590 Localized enlarged lymph nodes: Secondary | ICD-10-CM | POA: Diagnosis not present

## 2021-12-01 DIAGNOSIS — N186 End stage renal disease: Secondary | ICD-10-CM | POA: Diagnosis not present

## 2021-12-01 MED ORDER — IOHEXOL 300 MG/ML  SOLN
60.0000 mL | Freq: Once | INTRAMUSCULAR | Status: AC | PRN
  Administered 2021-12-01: 60 mL via INTRAVENOUS

## 2021-12-02 DIAGNOSIS — D509 Iron deficiency anemia, unspecified: Secondary | ICD-10-CM | POA: Diagnosis not present

## 2021-12-02 DIAGNOSIS — N186 End stage renal disease: Secondary | ICD-10-CM | POA: Diagnosis not present

## 2021-12-02 DIAGNOSIS — Z992 Dependence on renal dialysis: Secondary | ICD-10-CM | POA: Diagnosis not present

## 2021-12-02 DIAGNOSIS — D631 Anemia in chronic kidney disease: Secondary | ICD-10-CM | POA: Diagnosis not present

## 2021-12-03 DIAGNOSIS — D631 Anemia in chronic kidney disease: Secondary | ICD-10-CM | POA: Diagnosis not present

## 2021-12-03 DIAGNOSIS — N186 End stage renal disease: Secondary | ICD-10-CM | POA: Diagnosis not present

## 2021-12-03 DIAGNOSIS — D509 Iron deficiency anemia, unspecified: Secondary | ICD-10-CM | POA: Diagnosis not present

## 2021-12-03 DIAGNOSIS — Z992 Dependence on renal dialysis: Secondary | ICD-10-CM | POA: Diagnosis not present

## 2021-12-04 DIAGNOSIS — Z992 Dependence on renal dialysis: Secondary | ICD-10-CM | POA: Diagnosis not present

## 2021-12-04 DIAGNOSIS — D509 Iron deficiency anemia, unspecified: Secondary | ICD-10-CM | POA: Diagnosis not present

## 2021-12-04 DIAGNOSIS — N186 End stage renal disease: Secondary | ICD-10-CM | POA: Diagnosis not present

## 2021-12-04 DIAGNOSIS — D631 Anemia in chronic kidney disease: Secondary | ICD-10-CM | POA: Diagnosis not present

## 2021-12-05 DIAGNOSIS — N186 End stage renal disease: Secondary | ICD-10-CM | POA: Diagnosis not present

## 2021-12-05 DIAGNOSIS — D509 Iron deficiency anemia, unspecified: Secondary | ICD-10-CM | POA: Diagnosis not present

## 2021-12-05 DIAGNOSIS — D631 Anemia in chronic kidney disease: Secondary | ICD-10-CM | POA: Diagnosis not present

## 2021-12-05 DIAGNOSIS — Z992 Dependence on renal dialysis: Secondary | ICD-10-CM | POA: Diagnosis not present

## 2021-12-06 DIAGNOSIS — D509 Iron deficiency anemia, unspecified: Secondary | ICD-10-CM | POA: Diagnosis not present

## 2021-12-06 DIAGNOSIS — N186 End stage renal disease: Secondary | ICD-10-CM | POA: Diagnosis not present

## 2021-12-06 DIAGNOSIS — Z992 Dependence on renal dialysis: Secondary | ICD-10-CM | POA: Diagnosis not present

## 2021-12-06 DIAGNOSIS — D631 Anemia in chronic kidney disease: Secondary | ICD-10-CM | POA: Diagnosis not present

## 2021-12-07 DIAGNOSIS — D631 Anemia in chronic kidney disease: Secondary | ICD-10-CM | POA: Diagnosis not present

## 2021-12-07 DIAGNOSIS — N186 End stage renal disease: Secondary | ICD-10-CM | POA: Diagnosis not present

## 2021-12-07 DIAGNOSIS — Z992 Dependence on renal dialysis: Secondary | ICD-10-CM | POA: Diagnosis not present

## 2021-12-07 DIAGNOSIS — D509 Iron deficiency anemia, unspecified: Secondary | ICD-10-CM | POA: Diagnosis not present

## 2021-12-08 DIAGNOSIS — D631 Anemia in chronic kidney disease: Secondary | ICD-10-CM | POA: Diagnosis not present

## 2021-12-08 DIAGNOSIS — Z992 Dependence on renal dialysis: Secondary | ICD-10-CM | POA: Diagnosis not present

## 2021-12-08 DIAGNOSIS — D509 Iron deficiency anemia, unspecified: Secondary | ICD-10-CM | POA: Diagnosis not present

## 2021-12-08 DIAGNOSIS — N186 End stage renal disease: Secondary | ICD-10-CM | POA: Diagnosis not present

## 2021-12-09 DIAGNOSIS — N186 End stage renal disease: Secondary | ICD-10-CM | POA: Diagnosis not present

## 2021-12-09 DIAGNOSIS — D631 Anemia in chronic kidney disease: Secondary | ICD-10-CM | POA: Diagnosis not present

## 2021-12-09 DIAGNOSIS — D509 Iron deficiency anemia, unspecified: Secondary | ICD-10-CM | POA: Diagnosis not present

## 2021-12-09 DIAGNOSIS — Z992 Dependence on renal dialysis: Secondary | ICD-10-CM | POA: Diagnosis not present

## 2021-12-10 DIAGNOSIS — D509 Iron deficiency anemia, unspecified: Secondary | ICD-10-CM | POA: Diagnosis not present

## 2021-12-10 DIAGNOSIS — N186 End stage renal disease: Secondary | ICD-10-CM | POA: Diagnosis not present

## 2021-12-10 DIAGNOSIS — Z992 Dependence on renal dialysis: Secondary | ICD-10-CM | POA: Diagnosis not present

## 2021-12-10 DIAGNOSIS — D631 Anemia in chronic kidney disease: Secondary | ICD-10-CM | POA: Diagnosis not present

## 2021-12-11 DIAGNOSIS — Z992 Dependence on renal dialysis: Secondary | ICD-10-CM | POA: Diagnosis not present

## 2021-12-11 DIAGNOSIS — N186 End stage renal disease: Secondary | ICD-10-CM | POA: Diagnosis not present

## 2021-12-11 DIAGNOSIS — D631 Anemia in chronic kidney disease: Secondary | ICD-10-CM | POA: Diagnosis not present

## 2021-12-11 DIAGNOSIS — D509 Iron deficiency anemia, unspecified: Secondary | ICD-10-CM | POA: Diagnosis not present

## 2021-12-12 DIAGNOSIS — D509 Iron deficiency anemia, unspecified: Secondary | ICD-10-CM | POA: Diagnosis not present

## 2021-12-12 DIAGNOSIS — Z992 Dependence on renal dialysis: Secondary | ICD-10-CM | POA: Diagnosis not present

## 2021-12-12 DIAGNOSIS — N186 End stage renal disease: Secondary | ICD-10-CM | POA: Diagnosis not present

## 2021-12-12 DIAGNOSIS — D631 Anemia in chronic kidney disease: Secondary | ICD-10-CM | POA: Diagnosis not present

## 2021-12-13 DIAGNOSIS — N186 End stage renal disease: Secondary | ICD-10-CM | POA: Diagnosis not present

## 2021-12-13 DIAGNOSIS — Z992 Dependence on renal dialysis: Secondary | ICD-10-CM | POA: Diagnosis not present

## 2021-12-13 DIAGNOSIS — D509 Iron deficiency anemia, unspecified: Secondary | ICD-10-CM | POA: Diagnosis not present

## 2021-12-13 DIAGNOSIS — D631 Anemia in chronic kidney disease: Secondary | ICD-10-CM | POA: Diagnosis not present

## 2021-12-14 DIAGNOSIS — D631 Anemia in chronic kidney disease: Secondary | ICD-10-CM | POA: Diagnosis not present

## 2021-12-14 DIAGNOSIS — Z992 Dependence on renal dialysis: Secondary | ICD-10-CM | POA: Diagnosis not present

## 2021-12-14 DIAGNOSIS — N186 End stage renal disease: Secondary | ICD-10-CM | POA: Diagnosis not present

## 2021-12-14 DIAGNOSIS — D509 Iron deficiency anemia, unspecified: Secondary | ICD-10-CM | POA: Diagnosis not present

## 2021-12-15 DIAGNOSIS — D509 Iron deficiency anemia, unspecified: Secondary | ICD-10-CM | POA: Diagnosis not present

## 2021-12-15 DIAGNOSIS — Z992 Dependence on renal dialysis: Secondary | ICD-10-CM | POA: Diagnosis not present

## 2021-12-15 DIAGNOSIS — D631 Anemia in chronic kidney disease: Secondary | ICD-10-CM | POA: Diagnosis not present

## 2021-12-15 DIAGNOSIS — N186 End stage renal disease: Secondary | ICD-10-CM | POA: Diagnosis not present

## 2021-12-16 DIAGNOSIS — D631 Anemia in chronic kidney disease: Secondary | ICD-10-CM | POA: Diagnosis not present

## 2021-12-16 DIAGNOSIS — Z992 Dependence on renal dialysis: Secondary | ICD-10-CM | POA: Diagnosis not present

## 2021-12-16 DIAGNOSIS — D509 Iron deficiency anemia, unspecified: Secondary | ICD-10-CM | POA: Diagnosis not present

## 2021-12-16 DIAGNOSIS — N186 End stage renal disease: Secondary | ICD-10-CM | POA: Diagnosis not present

## 2021-12-17 DIAGNOSIS — D509 Iron deficiency anemia, unspecified: Secondary | ICD-10-CM | POA: Diagnosis not present

## 2021-12-17 DIAGNOSIS — D631 Anemia in chronic kidney disease: Secondary | ICD-10-CM | POA: Diagnosis not present

## 2021-12-17 DIAGNOSIS — N186 End stage renal disease: Secondary | ICD-10-CM | POA: Diagnosis not present

## 2021-12-17 DIAGNOSIS — Z992 Dependence on renal dialysis: Secondary | ICD-10-CM | POA: Diagnosis not present

## 2021-12-18 DIAGNOSIS — Z992 Dependence on renal dialysis: Secondary | ICD-10-CM | POA: Diagnosis not present

## 2021-12-18 DIAGNOSIS — D509 Iron deficiency anemia, unspecified: Secondary | ICD-10-CM | POA: Diagnosis not present

## 2021-12-18 DIAGNOSIS — D631 Anemia in chronic kidney disease: Secondary | ICD-10-CM | POA: Diagnosis not present

## 2021-12-18 DIAGNOSIS — N186 End stage renal disease: Secondary | ICD-10-CM | POA: Diagnosis not present

## 2021-12-19 DIAGNOSIS — N186 End stage renal disease: Secondary | ICD-10-CM | POA: Diagnosis not present

## 2021-12-19 DIAGNOSIS — Z992 Dependence on renal dialysis: Secondary | ICD-10-CM | POA: Diagnosis not present

## 2021-12-19 DIAGNOSIS — D509 Iron deficiency anemia, unspecified: Secondary | ICD-10-CM | POA: Diagnosis not present

## 2021-12-19 DIAGNOSIS — D631 Anemia in chronic kidney disease: Secondary | ICD-10-CM | POA: Diagnosis not present

## 2021-12-20 ENCOUNTER — Encounter: Payer: Self-pay | Admitting: Oncology

## 2021-12-20 ENCOUNTER — Inpatient Hospital Stay (HOSPITAL_BASED_OUTPATIENT_CLINIC_OR_DEPARTMENT_OTHER): Payer: Medicare Other | Admitting: Oncology

## 2021-12-20 VITALS — BP 102/68 | HR 59 | Temp 97.9°F | Resp 16 | Wt 177.9 lb

## 2021-12-20 DIAGNOSIS — R634 Abnormal weight loss: Secondary | ICD-10-CM

## 2021-12-20 DIAGNOSIS — D631 Anemia in chronic kidney disease: Secondary | ICD-10-CM | POA: Diagnosis not present

## 2021-12-20 DIAGNOSIS — D509 Iron deficiency anemia, unspecified: Secondary | ICD-10-CM | POA: Diagnosis not present

## 2021-12-20 DIAGNOSIS — Z79899 Other long term (current) drug therapy: Secondary | ICD-10-CM | POA: Diagnosis not present

## 2021-12-20 DIAGNOSIS — I120 Hypertensive chronic kidney disease with stage 5 chronic kidney disease or end stage renal disease: Secondary | ICD-10-CM | POA: Diagnosis not present

## 2021-12-20 DIAGNOSIS — N185 Chronic kidney disease, stage 5: Secondary | ICD-10-CM | POA: Diagnosis not present

## 2021-12-20 DIAGNOSIS — Z992 Dependence on renal dialysis: Secondary | ICD-10-CM | POA: Diagnosis not present

## 2021-12-20 DIAGNOSIS — N186 End stage renal disease: Secondary | ICD-10-CM | POA: Diagnosis not present

## 2021-12-21 DIAGNOSIS — Z992 Dependence on renal dialysis: Secondary | ICD-10-CM | POA: Diagnosis not present

## 2021-12-21 DIAGNOSIS — N186 End stage renal disease: Secondary | ICD-10-CM | POA: Diagnosis not present

## 2021-12-21 DIAGNOSIS — D509 Iron deficiency anemia, unspecified: Secondary | ICD-10-CM | POA: Diagnosis not present

## 2021-12-21 DIAGNOSIS — D631 Anemia in chronic kidney disease: Secondary | ICD-10-CM | POA: Diagnosis not present

## 2021-12-22 ENCOUNTER — Encounter: Payer: Self-pay | Admitting: Oncology

## 2021-12-22 DIAGNOSIS — Z992 Dependence on renal dialysis: Secondary | ICD-10-CM | POA: Diagnosis not present

## 2021-12-22 DIAGNOSIS — D631 Anemia in chronic kidney disease: Secondary | ICD-10-CM | POA: Diagnosis not present

## 2021-12-22 DIAGNOSIS — N186 End stage renal disease: Secondary | ICD-10-CM | POA: Diagnosis not present

## 2021-12-22 DIAGNOSIS — D509 Iron deficiency anemia, unspecified: Secondary | ICD-10-CM | POA: Diagnosis not present

## 2021-12-22 NOTE — Progress Notes (Signed)
Hematology/Oncology Consult note [facility]  Telephone:([phone] [phone]  Patient Care Team: Leonel Ramsay, MD as PCP - General (Infectious Diseases) Bary Castilla, Forest Gleason, MD (General Surgery) Leonel Ramsay, MD (Infectious Diseases) Anthonette Legato, MD (Internal Medicine) Sindy Guadeloupe, MD as Consulting Physician (Hematology and Oncology)   Name of the patient: Roger Knight  [id number]  07-09-1950   Date of visit: 12/22/21  Diagnosis- anemia of chronic kidney disease as well as component of iron deficiency  Unintentional weight loss  Chief complaint/ Reason for visit-discuss CT scan results and further management  Heme/Onc history: Patient is a 71 year old male with history ofStage III CKD who has seen me in the past for anemia which was attributed to a component of iron deficiency as well as anemia of chronic kidney disease.  He has received IV iron in the past but has never required any erythropoietin.  Last received IV iron and September 2021 and was subsequently lost to follow-up.   Patient had an EGD colonoscopy in January 2017. EGD showed a nonbleeding gastric polyp which was biopsied and colonoscopy showed internal and external hemorrhoids. The cause of his GI bleed in the past has been attributed to his hemorrhoids.Also had another colonoscopy in January 2023 which showed diverticulosis in the sigmoid colon.  5 mm polyp in the proximal descending colon.  Biopsies negative for malignancy and showed tubular adenoma.   Patient sees Dr. Holley Raring for his CKD.  He has now progressed to end-stage renal disease requiring peritoneal dialysis.  He was previously on the transplant list but was later not deemed to be a candidate for transplant after a stress test.    Interval history-weight loss has somewhat stabilized.  He still has ongoing fatigue.  ECOG PS- 2 Pain scale- 0   Review of systems- Review of Systems   Constitutional:  Positive for malaise/fatigue and weight loss. Negative for chills and fever.  HENT:  Negative for congestion, ear discharge and nosebleeds.   Eyes:  Negative for blurred vision.  Respiratory:  Negative for cough, hemoptysis, sputum production, shortness of breath and wheezing.   Cardiovascular:  Negative for chest pain, palpitations, orthopnea and claudication.  Gastrointestinal:  Negative for abdominal pain, blood in stool, constipation, diarrhea, heartburn, melena, nausea and vomiting.  Genitourinary:  Negative for dysuria, flank pain, frequency, hematuria and urgency.  Musculoskeletal:  Negative for back pain, joint pain and myalgias.  Skin:  Negative for rash.  Neurological:  Negative for dizziness, tingling, focal weakness, seizures, weakness and headaches.  Endo/Heme/Allergies:  Does not bruise/bleed easily.  Psychiatric/Behavioral:  Negative for depression and suicidal ideas. The patient does not have insomnia.       No Known Allergies   Past Medical History:  Diagnosis Date   Accelerating angina ([place]) 02/11/2014   Anemia    Arthritis    Carcinoma ([place]) 2012    skin cancer on neck [facility]   Chronic kidney disease 2016   Diabetes mellitus without complication (HCC)    GERD (gastroesophageal reflux disease)    Hemorrhoids 2016-17   Hernia    Hyperlipidemia    Hypertension    Skin cancer      Past Surgical History:  Procedure Laterality Date   COLONOSCOPY  April 2014   COLONOSCOPY N/A 05/13/2015   Procedure: COLONOSCOPY;  Surgeon: Josefine Class, MD;  Location: [facility] ENDOSCOPY;  Service: Endoscopy;  Laterality: N/A;   COLONOSCOPY WITH PROPOFOL N/A 05/04/2021   Procedure: COLONOSCOPY WITH PROPOFOL;  Surgeon: Robert Bellow, MD;  Location: [facility] ENDOSCOPY;  Service: Endoscopy;  Laterality: N/A;  DM   CORONARY ARTERY BYPASS GRAFT  1996   [facility]   CORONARY STENT PLACEMENT  2011   [facility]   ESOPHAGOGASTRODUODENOSCOPY (EGD) WITH PROPOFOL N/A 05/13/2015    Procedure: ESOPHAGOGASTRODUODENOSCOPY (EGD) WITH PROPOFOL;  Surgeon: Josefine Class, MD;  Location: [facility] ENDOSCOPY;  Service: Endoscopy;  Laterality: N/A;   LEFT HEART CATH AND CORS/GRAFTS ANGIOGRAPHY N/A 01/24/2021   Procedure: LEFT HEART CATH AND CORS/GRAFTS ANGIOGRAPHY;  Surgeon: Andrez Grime, MD;  Location: [place] CV LAB;  Service: Cardiovascular;  Laterality: N/A;   UPPER GI ENDOSCOPY  2014    Social History   Socioeconomic History   Marital status: Married    Spouse name: Not on file   Number of children: Not on file   Years of education: Not on file   Highest education level: Not on file  Occupational History   Not on file  Tobacco Use   Smoking status: Former    Types: Cigarettes    Quit date: 04/24/2005    Years since quitting: 16.6   Smokeless tobacco: Former    Types: Nurse, children's Use: Never used  Substance and Sexual Activity   Alcohol use: No   Drug use: No   Sexual activity: Yes  Other Topics Concern   Not on file  Social History Narrative   Not on file   Social Determinants of Health   Financial Resource Strain: Not on file  Food Insecurity: Not on file  Transportation Needs: Not on file  Physical Activity: Not on file  Stress: Not on file  Social Connections: Not on file  Intimate Partner Violence: Not on file    Family History  Problem Relation Age of Onset   Dementia Mother 75   Osteoarthritis Mother    Cancer Mother      Current Outpatient Medications:    aspirin 81 MG tablet, Take 81 mg by mouth daily., Disp: , Rfl:    atorvastatin (LIPITOR) 80 MG tablet, Take 80 mg by mouth daily., Disp: , Rfl:    calcitRIOL (ROCALTROL) 0.25 MCG capsule, Take 0.25 mcg by mouth daily. , Disp: , Rfl:    Calcium Polycarbophil (FIBER) 625 MG TABS, Take 2 capsules by mouth in the morning and at bedtime., Disp: , Rfl:    cetirizine (ZYRTEC) 10 MG tablet, Take 10 mg by mouth daily., Disp: , Rfl:    ezetimibe (ZETIA) 10 MG tablet,  Take 10 mg by mouth daily., Disp: , Rfl:    gabapentin (NEURONTIN) 300 MG capsule, Take 300 mg by mouth at bedtime., Disp: , Rfl:    insulin aspart (NOVOLOG) 100 UNIT/ML injection, Inject 20 Units into the skin 3 (three) times daily. , Disp: , Rfl:    Insulin Pen Needle 32G X 4 MM MISC, USE WITH INJECTIONS 4 TIMES DAILY, Disp: , Rfl:    metoprolol (TOPROL-XL) 200 MG 24 hr tablet, Take 200 mg by mouth daily., Disp: , Rfl:    Multiple Vitamins-Minerals (MULTIVITAMIN WITH MINERALS) tablet, Take 1 tablet by mouth daily., Disp: , Rfl:    pantoprazole (PROTONIX) 40 MG tablet, Take 40 mg by mouth 2 (two) times daily before a meal. , Disp: , Rfl:    sildenafil (VIAGRA) 100 MG tablet, Take 100 mg by mouth as needed for erectile dysfunction. Reported on 06/14/2015, Disp: , Rfl:    spironolactone (ALDACTONE) 25 MG tablet, Take 25 mg  by mouth 2 (two) times daily., Disp: , Rfl:    TRESIBA FLEXTOUCH 200 UNIT/ML SOPN, Inject 90 Units into the skin at bedtime., Disp: , Rfl:    triamcinolone cream (KENALOG) 0.1 %, Apply 1 application topically 2 (two) times daily as needed (irritation)., Disp: , Rfl:    valsartan-hydrochlorothiazide (DIOVAN-HCT) 320-25 MG per tablet, Take 1 tablet by mouth daily., Disp: , Rfl:    amLODipine (NORVASC) 5 MG tablet, Take 5 mg by mouth daily.  (Patient not taking: Reported on 11/22/2021), Disp: , Rfl:   Physical exam:  Vitals:   12/20/21 1107  BP: 102/68  Pulse: (!) 59  Resp: 16  Temp: 97.9 F (36.6 C)  SpO2: 100%  Weight: 177 lb 14.4 oz (80.7 kg)   Physical Exam Constitutional:      General: He is not in acute distress. Cardiovascular:     Rate and Rhythm: Normal rate and regular rhythm.     Heart sounds: Normal heart sounds.  Pulmonary:     Effort: Pulmonary effort is normal.  Skin:    General: Skin is warm and dry.  Neurological:     Mental Status: He is alert and oriented to person, place, and time.         Latest Ref Rng & Units 11/22/2021    9:06 AM  CMP   Glucose 70 - 99 mg/dL 223   BUN 8 - 23 mg/dL 54   Creatinine 0.61 - 1.24 mg/dL 5.65   Sodium 135 - 145 mmol/L 134   Potassium 3.5 - 5.1 mmol/L 2.8   Chloride 98 - 111 mmol/L 93   CO2 22 - 32 mmol/L 27   Calcium 8.9 - 10.3 mg/dL 8.6   Total Protein 6.5 - 8.1 g/dL 6.6   Total Bilirubin 0.3 - 1.2 mg/dL 0.4   Alkaline Phos 38 - 126 U/L 73   AST 15 - 41 U/L 31   ALT 0 - 44 U/L 28       Latest Ref Rng & Units 11/22/2021    9:06 AM  CBC  WBC 4.0 - 10.5 K/uL 7.4   Hemoglobin 13.0 - 17.0 g/dL 11.1   Hematocrit 39.0 - 52.0 % 33.0   Platelets 150 - 400 K/uL 237     No images are attached to the encounter.  CT Chest W Contrast  Result Date: 12/01/2021 CLINICAL DATA:  weight loss, fatigued, stage IV kidney failure, abnormal Ct scan Anemia in chronic kidney disease on chronic dialysis. Unintentional weight loss. Iron deficiency anemia. Fatigue. EXAM: CT CHEST WITH CONTRAST TECHNIQUE: Multidetector CT imaging of the chest was performed during intravenous contrast administration. RADIATION DOSE REDUCTION: This exam was performed according to the departmental dose-optimization program which includes automated exposure control, adjustment of the mA and/or kV according to patient size and/or use of iterative reconstruction technique. CONTRAST:  19m OMNIPAQUE IOHEXOL 300 MG/ML  SOLN COMPARISON:  Lung bases from abdominopelvic CT 04/08/2021. Chest radiograph 05/14/2015, no prior chest CT available FINDINGS: Cardiovascular: Post CABG with calcification of native coronary arteries. Moderate atherosclerosis of the thoracic aorta. No dissection or acute findings. There is no central pulmonary embolus to the proximal segmental level. Heart size upper normal. No pericardial effusion. Mediastinum/Nodes: Scattered small mediastinal lymph nodes, not enlarged by size criteria. There is no hilar adenopathy. No esophageal wall thickening. 10 mm hypodense left thyroid nodule. Not clinically significant; no follow-up  imaging recommended (ref: J Am Coll Radiol. 2015 Feb;12(2): 143-50). Lungs/Pleura: There is no pulmonary mass or suspicious  nodule. Occasional subsegmental atelectasis, no confluent airspace disease. No features of pulmonary edema. No pleural effusion. No endobronchial lesion. Upper Abdomen: Small amount of free fluid in the upper abdomen, slightly increased from prior abdominal CT. Simple bilateral renal cysts, partially included. These need no further follow-up. Gallstones. There is diffuse gastric wall thickening. Musculoskeletal: Prior median sternotomy. Thoracic spondylosis with anterior spurring. There are no acute or suspicious osseous abnormalities. No chest wall soft tissue abnormalities IMPRESSION: 1. No acute intrathoracic abnormality. No evidence of malignancy or explanation for weight loss. 2. Post CABG.  Aortic atherosclerosis. 3. Diffuse gastric wall thickening, nonspecific, likely accentuated by nondistention. 4. Small amount of free fluid in the upper abdomen, slightly increased from prior abdominal CT. 5. Cholelithiasis. Aortic Atherosclerosis (ICD10-I70.0). Electronically Signed   By: Keith Rake M.D.   On: 12/01/2021 21:19     Assessment and plan- Patient is a 71 y.o. male who is here to discuss CT scan results and further management  Patient was referred mainly for unintentional weight loss.  I have reviewed CT chest abdomen pelvis images independently.  CT chest abdomen and pelvis without contrast did not show any evidence of malignancy that would explain his weight loss.  It is possible that his weight loss and fatigue is secondary to ongoing dialysis and I would recommend further follow-up with his PCP and nephrology.  With regards to anemia of chronic kidney disease: Patient is receiving IV iron and EPO through dialysis.  Hemoglobin currently remained stableBetween 10-11.  He does not require follow-up with me for his anemia   Visit Diagnosis 1. Abnormal weight loss   2.  Anemia of chronic kidney failure, stage 5 (HCC)      Dr. Randa Evens, MD, MPH [facility] [phone] 12/22/2021 9:30 AM

## 2021-12-23 DIAGNOSIS — D631 Anemia in chronic kidney disease: Secondary | ICD-10-CM | POA: Diagnosis not present

## 2021-12-23 DIAGNOSIS — N186 End stage renal disease: Secondary | ICD-10-CM | POA: Diagnosis not present

## 2021-12-23 DIAGNOSIS — Z992 Dependence on renal dialysis: Secondary | ICD-10-CM | POA: Diagnosis not present

## 2021-12-23 DIAGNOSIS — D509 Iron deficiency anemia, unspecified: Secondary | ICD-10-CM | POA: Diagnosis not present

## 2021-12-24 DIAGNOSIS — Z992 Dependence on renal dialysis: Secondary | ICD-10-CM | POA: Diagnosis not present

## 2021-12-24 DIAGNOSIS — D509 Iron deficiency anemia, unspecified: Secondary | ICD-10-CM | POA: Diagnosis not present

## 2021-12-24 DIAGNOSIS — D631 Anemia in chronic kidney disease: Secondary | ICD-10-CM | POA: Diagnosis not present

## 2021-12-24 DIAGNOSIS — N186 End stage renal disease: Secondary | ICD-10-CM | POA: Diagnosis not present

## 2021-12-25 DIAGNOSIS — D631 Anemia in chronic kidney disease: Secondary | ICD-10-CM | POA: Diagnosis not present

## 2021-12-25 DIAGNOSIS — N186 End stage renal disease: Secondary | ICD-10-CM | POA: Diagnosis not present

## 2021-12-25 DIAGNOSIS — D509 Iron deficiency anemia, unspecified: Secondary | ICD-10-CM | POA: Diagnosis not present

## 2021-12-25 DIAGNOSIS — Z992 Dependence on renal dialysis: Secondary | ICD-10-CM | POA: Diagnosis not present

## 2021-12-26 DIAGNOSIS — D509 Iron deficiency anemia, unspecified: Secondary | ICD-10-CM | POA: Diagnosis not present

## 2021-12-26 DIAGNOSIS — D631 Anemia in chronic kidney disease: Secondary | ICD-10-CM | POA: Diagnosis not present

## 2021-12-26 DIAGNOSIS — Z992 Dependence on renal dialysis: Secondary | ICD-10-CM | POA: Diagnosis not present

## 2021-12-26 DIAGNOSIS — N186 End stage renal disease: Secondary | ICD-10-CM | POA: Diagnosis not present

## 2021-12-27 DIAGNOSIS — D631 Anemia in chronic kidney disease: Secondary | ICD-10-CM | POA: Diagnosis not present

## 2021-12-27 DIAGNOSIS — Z992 Dependence on renal dialysis: Secondary | ICD-10-CM | POA: Diagnosis not present

## 2021-12-27 DIAGNOSIS — D509 Iron deficiency anemia, unspecified: Secondary | ICD-10-CM | POA: Diagnosis not present

## 2021-12-27 DIAGNOSIS — N186 End stage renal disease: Secondary | ICD-10-CM | POA: Diagnosis not present

## 2021-12-28 DIAGNOSIS — D509 Iron deficiency anemia, unspecified: Secondary | ICD-10-CM | POA: Diagnosis not present

## 2021-12-28 DIAGNOSIS — Z992 Dependence on renal dialysis: Secondary | ICD-10-CM | POA: Diagnosis not present

## 2021-12-28 DIAGNOSIS — D631 Anemia in chronic kidney disease: Secondary | ICD-10-CM | POA: Diagnosis not present

## 2021-12-28 DIAGNOSIS — N186 End stage renal disease: Secondary | ICD-10-CM | POA: Diagnosis not present

## 2021-12-29 DIAGNOSIS — Z992 Dependence on renal dialysis: Secondary | ICD-10-CM | POA: Diagnosis not present

## 2021-12-29 DIAGNOSIS — N186 End stage renal disease: Secondary | ICD-10-CM | POA: Diagnosis not present

## 2021-12-29 DIAGNOSIS — D631 Anemia in chronic kidney disease: Secondary | ICD-10-CM | POA: Diagnosis not present

## 2021-12-29 DIAGNOSIS — D509 Iron deficiency anemia, unspecified: Secondary | ICD-10-CM | POA: Diagnosis not present

## 2021-12-30 DIAGNOSIS — Z992 Dependence on renal dialysis: Secondary | ICD-10-CM | POA: Diagnosis not present

## 2021-12-30 DIAGNOSIS — N186 End stage renal disease: Secondary | ICD-10-CM | POA: Diagnosis not present

## 2021-12-30 DIAGNOSIS — D631 Anemia in chronic kidney disease: Secondary | ICD-10-CM | POA: Diagnosis not present

## 2021-12-30 DIAGNOSIS — D509 Iron deficiency anemia, unspecified: Secondary | ICD-10-CM | POA: Diagnosis not present

## 2021-12-31 DIAGNOSIS — D631 Anemia in chronic kidney disease: Secondary | ICD-10-CM | POA: Diagnosis not present

## 2021-12-31 DIAGNOSIS — N186 End stage renal disease: Secondary | ICD-10-CM | POA: Diagnosis not present

## 2021-12-31 DIAGNOSIS — D509 Iron deficiency anemia, unspecified: Secondary | ICD-10-CM | POA: Diagnosis not present

## 2021-12-31 DIAGNOSIS — Z992 Dependence on renal dialysis: Secondary | ICD-10-CM | POA: Diagnosis not present

## 2022-01-01 DIAGNOSIS — N186 End stage renal disease: Secondary | ICD-10-CM | POA: Diagnosis not present

## 2022-01-01 DIAGNOSIS — D631 Anemia in chronic kidney disease: Secondary | ICD-10-CM | POA: Diagnosis not present

## 2022-01-01 DIAGNOSIS — D509 Iron deficiency anemia, unspecified: Secondary | ICD-10-CM | POA: Diagnosis not present

## 2022-01-01 DIAGNOSIS — Z992 Dependence on renal dialysis: Secondary | ICD-10-CM | POA: Diagnosis not present

## 2022-01-02 DIAGNOSIS — Z992 Dependence on renal dialysis: Secondary | ICD-10-CM | POA: Diagnosis not present

## 2022-01-02 DIAGNOSIS — D631 Anemia in chronic kidney disease: Secondary | ICD-10-CM | POA: Diagnosis not present

## 2022-01-02 DIAGNOSIS — N186 End stage renal disease: Secondary | ICD-10-CM | POA: Diagnosis not present

## 2022-01-02 DIAGNOSIS — D509 Iron deficiency anemia, unspecified: Secondary | ICD-10-CM | POA: Diagnosis not present

## 2022-01-03 DIAGNOSIS — D509 Iron deficiency anemia, unspecified: Secondary | ICD-10-CM | POA: Diagnosis not present

## 2022-01-03 DIAGNOSIS — D631 Anemia in chronic kidney disease: Secondary | ICD-10-CM | POA: Diagnosis not present

## 2022-01-03 DIAGNOSIS — Z992 Dependence on renal dialysis: Secondary | ICD-10-CM | POA: Diagnosis not present

## 2022-01-03 DIAGNOSIS — N186 End stage renal disease: Secondary | ICD-10-CM | POA: Diagnosis not present

## 2022-01-04 DIAGNOSIS — D631 Anemia in chronic kidney disease: Secondary | ICD-10-CM | POA: Diagnosis not present

## 2022-01-04 DIAGNOSIS — Z992 Dependence on renal dialysis: Secondary | ICD-10-CM | POA: Diagnosis not present

## 2022-01-04 DIAGNOSIS — D509 Iron deficiency anemia, unspecified: Secondary | ICD-10-CM | POA: Diagnosis not present

## 2022-01-04 DIAGNOSIS — N186 End stage renal disease: Secondary | ICD-10-CM | POA: Diagnosis not present

## 2022-01-05 DIAGNOSIS — N186 End stage renal disease: Secondary | ICD-10-CM | POA: Diagnosis not present

## 2022-01-05 DIAGNOSIS — Z992 Dependence on renal dialysis: Secondary | ICD-10-CM | POA: Diagnosis not present

## 2022-01-05 DIAGNOSIS — D509 Iron deficiency anemia, unspecified: Secondary | ICD-10-CM | POA: Diagnosis not present

## 2022-01-05 DIAGNOSIS — D631 Anemia in chronic kidney disease: Secondary | ICD-10-CM | POA: Diagnosis not present

## 2022-01-06 DIAGNOSIS — D631 Anemia in chronic kidney disease: Secondary | ICD-10-CM | POA: Diagnosis not present

## 2022-01-06 DIAGNOSIS — Z992 Dependence on renal dialysis: Secondary | ICD-10-CM | POA: Diagnosis not present

## 2022-01-06 DIAGNOSIS — N186 End stage renal disease: Secondary | ICD-10-CM | POA: Diagnosis not present

## 2022-01-06 DIAGNOSIS — D509 Iron deficiency anemia, unspecified: Secondary | ICD-10-CM | POA: Diagnosis not present

## 2022-01-07 DIAGNOSIS — Z992 Dependence on renal dialysis: Secondary | ICD-10-CM | POA: Diagnosis not present

## 2022-01-07 DIAGNOSIS — D631 Anemia in chronic kidney disease: Secondary | ICD-10-CM | POA: Diagnosis not present

## 2022-01-07 DIAGNOSIS — N186 End stage renal disease: Secondary | ICD-10-CM | POA: Diagnosis not present

## 2022-01-07 DIAGNOSIS — D509 Iron deficiency anemia, unspecified: Secondary | ICD-10-CM | POA: Diagnosis not present

## 2022-01-08 DIAGNOSIS — N186 End stage renal disease: Secondary | ICD-10-CM | POA: Diagnosis not present

## 2022-01-08 DIAGNOSIS — D509 Iron deficiency anemia, unspecified: Secondary | ICD-10-CM | POA: Diagnosis not present

## 2022-01-08 DIAGNOSIS — Z992 Dependence on renal dialysis: Secondary | ICD-10-CM | POA: Diagnosis not present

## 2022-01-08 DIAGNOSIS — D631 Anemia in chronic kidney disease: Secondary | ICD-10-CM | POA: Diagnosis not present

## 2022-01-09 DIAGNOSIS — N186 End stage renal disease: Secondary | ICD-10-CM | POA: Diagnosis not present

## 2022-01-09 DIAGNOSIS — D509 Iron deficiency anemia, unspecified: Secondary | ICD-10-CM | POA: Diagnosis not present

## 2022-01-09 DIAGNOSIS — D631 Anemia in chronic kidney disease: Secondary | ICD-10-CM | POA: Diagnosis not present

## 2022-01-09 DIAGNOSIS — Z992 Dependence on renal dialysis: Secondary | ICD-10-CM | POA: Diagnosis not present

## 2022-01-10 DIAGNOSIS — Z992 Dependence on renal dialysis: Secondary | ICD-10-CM | POA: Diagnosis not present

## 2022-01-10 DIAGNOSIS — D631 Anemia in chronic kidney disease: Secondary | ICD-10-CM | POA: Diagnosis not present

## 2022-01-10 DIAGNOSIS — D509 Iron deficiency anemia, unspecified: Secondary | ICD-10-CM | POA: Diagnosis not present

## 2022-01-10 DIAGNOSIS — N186 End stage renal disease: Secondary | ICD-10-CM | POA: Diagnosis not present

## 2022-01-11 DIAGNOSIS — D509 Iron deficiency anemia, unspecified: Secondary | ICD-10-CM | POA: Diagnosis not present

## 2022-01-11 DIAGNOSIS — N186 End stage renal disease: Secondary | ICD-10-CM | POA: Diagnosis not present

## 2022-01-11 DIAGNOSIS — D631 Anemia in chronic kidney disease: Secondary | ICD-10-CM | POA: Diagnosis not present

## 2022-01-11 DIAGNOSIS — Z992 Dependence on renal dialysis: Secondary | ICD-10-CM | POA: Diagnosis not present

## 2022-01-12 DIAGNOSIS — N186 End stage renal disease: Secondary | ICD-10-CM | POA: Diagnosis not present

## 2022-01-12 DIAGNOSIS — Z992 Dependence on renal dialysis: Secondary | ICD-10-CM | POA: Diagnosis not present

## 2022-01-12 DIAGNOSIS — D631 Anemia in chronic kidney disease: Secondary | ICD-10-CM | POA: Diagnosis not present

## 2022-01-12 DIAGNOSIS — D509 Iron deficiency anemia, unspecified: Secondary | ICD-10-CM | POA: Diagnosis not present

## 2022-01-13 DIAGNOSIS — N186 End stage renal disease: Secondary | ICD-10-CM | POA: Diagnosis not present

## 2022-01-13 DIAGNOSIS — D631 Anemia in chronic kidney disease: Secondary | ICD-10-CM | POA: Diagnosis not present

## 2022-01-13 DIAGNOSIS — D509 Iron deficiency anemia, unspecified: Secondary | ICD-10-CM | POA: Diagnosis not present

## 2022-01-13 DIAGNOSIS — Z992 Dependence on renal dialysis: Secondary | ICD-10-CM | POA: Diagnosis not present

## 2022-01-14 DIAGNOSIS — D509 Iron deficiency anemia, unspecified: Secondary | ICD-10-CM | POA: Diagnosis not present

## 2022-01-14 DIAGNOSIS — N186 End stage renal disease: Secondary | ICD-10-CM | POA: Diagnosis not present

## 2022-01-14 DIAGNOSIS — D631 Anemia in chronic kidney disease: Secondary | ICD-10-CM | POA: Diagnosis not present

## 2022-01-14 DIAGNOSIS — Z992 Dependence on renal dialysis: Secondary | ICD-10-CM | POA: Diagnosis not present

## 2022-01-15 DIAGNOSIS — D631 Anemia in chronic kidney disease: Secondary | ICD-10-CM | POA: Diagnosis not present

## 2022-01-15 DIAGNOSIS — D509 Iron deficiency anemia, unspecified: Secondary | ICD-10-CM | POA: Diagnosis not present

## 2022-01-15 DIAGNOSIS — Z992 Dependence on renal dialysis: Secondary | ICD-10-CM | POA: Diagnosis not present

## 2022-01-15 DIAGNOSIS — N186 End stage renal disease: Secondary | ICD-10-CM | POA: Diagnosis not present

## 2022-01-16 DIAGNOSIS — D631 Anemia in chronic kidney disease: Secondary | ICD-10-CM | POA: Diagnosis not present

## 2022-01-16 DIAGNOSIS — Z992 Dependence on renal dialysis: Secondary | ICD-10-CM | POA: Diagnosis not present

## 2022-01-16 DIAGNOSIS — N186 End stage renal disease: Secondary | ICD-10-CM | POA: Diagnosis not present

## 2022-01-16 DIAGNOSIS — D509 Iron deficiency anemia, unspecified: Secondary | ICD-10-CM | POA: Diagnosis not present

## 2022-01-17 DIAGNOSIS — N186 End stage renal disease: Secondary | ICD-10-CM | POA: Diagnosis not present

## 2022-01-17 DIAGNOSIS — D631 Anemia in chronic kidney disease: Secondary | ICD-10-CM | POA: Diagnosis not present

## 2022-01-17 DIAGNOSIS — Z992 Dependence on renal dialysis: Secondary | ICD-10-CM | POA: Diagnosis not present

## 2022-01-17 DIAGNOSIS — D509 Iron deficiency anemia, unspecified: Secondary | ICD-10-CM | POA: Diagnosis not present

## 2022-01-18 DIAGNOSIS — D509 Iron deficiency anemia, unspecified: Secondary | ICD-10-CM | POA: Diagnosis not present

## 2022-01-18 DIAGNOSIS — Z992 Dependence on renal dialysis: Secondary | ICD-10-CM | POA: Diagnosis not present

## 2022-01-18 DIAGNOSIS — D631 Anemia in chronic kidney disease: Secondary | ICD-10-CM | POA: Diagnosis not present

## 2022-01-18 DIAGNOSIS — N186 End stage renal disease: Secondary | ICD-10-CM | POA: Diagnosis not present

## 2022-01-19 DIAGNOSIS — D509 Iron deficiency anemia, unspecified: Secondary | ICD-10-CM | POA: Diagnosis not present

## 2022-01-19 DIAGNOSIS — N186 End stage renal disease: Secondary | ICD-10-CM | POA: Diagnosis not present

## 2022-01-19 DIAGNOSIS — Z992 Dependence on renal dialysis: Secondary | ICD-10-CM | POA: Diagnosis not present

## 2022-01-19 DIAGNOSIS — D631 Anemia in chronic kidney disease: Secondary | ICD-10-CM | POA: Diagnosis not present

## 2022-01-20 DIAGNOSIS — Z992 Dependence on renal dialysis: Secondary | ICD-10-CM | POA: Diagnosis not present

## 2022-01-20 DIAGNOSIS — D631 Anemia in chronic kidney disease: Secondary | ICD-10-CM | POA: Diagnosis not present

## 2022-01-20 DIAGNOSIS — D509 Iron deficiency anemia, unspecified: Secondary | ICD-10-CM | POA: Diagnosis not present

## 2022-01-20 DIAGNOSIS — N186 End stage renal disease: Secondary | ICD-10-CM | POA: Diagnosis not present

## 2022-01-21 DIAGNOSIS — Z992 Dependence on renal dialysis: Secondary | ICD-10-CM | POA: Diagnosis not present

## 2022-01-21 DIAGNOSIS — D631 Anemia in chronic kidney disease: Secondary | ICD-10-CM | POA: Diagnosis not present

## 2022-01-21 DIAGNOSIS — D509 Iron deficiency anemia, unspecified: Secondary | ICD-10-CM | POA: Diagnosis not present

## 2022-01-21 DIAGNOSIS — N186 End stage renal disease: Secondary | ICD-10-CM | POA: Diagnosis not present

## 2022-01-22 DIAGNOSIS — D509 Iron deficiency anemia, unspecified: Secondary | ICD-10-CM | POA: Diagnosis not present

## 2022-01-22 DIAGNOSIS — Z992 Dependence on renal dialysis: Secondary | ICD-10-CM | POA: Diagnosis not present

## 2022-01-22 DIAGNOSIS — D631 Anemia in chronic kidney disease: Secondary | ICD-10-CM | POA: Diagnosis not present

## 2022-01-22 DIAGNOSIS — N186 End stage renal disease: Secondary | ICD-10-CM | POA: Diagnosis not present

## 2022-01-22 DIAGNOSIS — N250 Renal osteodystrophy: Secondary | ICD-10-CM | POA: Diagnosis not present

## 2022-01-22 DIAGNOSIS — N2581 Secondary hyperparathyroidism of renal origin: Secondary | ICD-10-CM | POA: Diagnosis not present

## 2022-01-23 DIAGNOSIS — D631 Anemia in chronic kidney disease: Secondary | ICD-10-CM | POA: Diagnosis not present

## 2022-01-23 DIAGNOSIS — Z992 Dependence on renal dialysis: Secondary | ICD-10-CM | POA: Diagnosis not present

## 2022-01-23 DIAGNOSIS — N186 End stage renal disease: Secondary | ICD-10-CM | POA: Diagnosis not present

## 2022-01-23 DIAGNOSIS — N2581 Secondary hyperparathyroidism of renal origin: Secondary | ICD-10-CM | POA: Diagnosis not present

## 2022-01-23 DIAGNOSIS — D509 Iron deficiency anemia, unspecified: Secondary | ICD-10-CM | POA: Diagnosis not present

## 2022-01-23 DIAGNOSIS — N250 Renal osteodystrophy: Secondary | ICD-10-CM | POA: Diagnosis not present

## 2022-01-24 DIAGNOSIS — E119 Type 2 diabetes mellitus without complications: Secondary | ICD-10-CM | POA: Diagnosis not present

## 2022-01-24 DIAGNOSIS — E785 Hyperlipidemia, unspecified: Secondary | ICD-10-CM | POA: Diagnosis not present

## 2022-01-24 DIAGNOSIS — Z794 Long term (current) use of insulin: Secondary | ICD-10-CM | POA: Diagnosis not present

## 2022-01-24 DIAGNOSIS — D509 Iron deficiency anemia, unspecified: Secondary | ICD-10-CM | POA: Diagnosis not present

## 2022-01-24 DIAGNOSIS — Z992 Dependence on renal dialysis: Secondary | ICD-10-CM | POA: Diagnosis not present

## 2022-01-24 DIAGNOSIS — Z79899 Other long term (current) drug therapy: Secondary | ICD-10-CM | POA: Diagnosis not present

## 2022-01-24 DIAGNOSIS — Z5181 Encounter for therapeutic drug level monitoring: Secondary | ICD-10-CM | POA: Diagnosis not present

## 2022-01-24 DIAGNOSIS — D631 Anemia in chronic kidney disease: Secondary | ICD-10-CM | POA: Diagnosis not present

## 2022-01-24 DIAGNOSIS — N186 End stage renal disease: Secondary | ICD-10-CM | POA: Diagnosis not present

## 2022-01-24 DIAGNOSIS — N2581 Secondary hyperparathyroidism of renal origin: Secondary | ICD-10-CM | POA: Diagnosis not present

## 2022-01-24 DIAGNOSIS — N250 Renal osteodystrophy: Secondary | ICD-10-CM | POA: Diagnosis not present

## 2022-01-25 DIAGNOSIS — D509 Iron deficiency anemia, unspecified: Secondary | ICD-10-CM | POA: Diagnosis not present

## 2022-01-25 DIAGNOSIS — N250 Renal osteodystrophy: Secondary | ICD-10-CM | POA: Diagnosis not present

## 2022-01-25 DIAGNOSIS — N2581 Secondary hyperparathyroidism of renal origin: Secondary | ICD-10-CM | POA: Diagnosis not present

## 2022-01-25 DIAGNOSIS — Z992 Dependence on renal dialysis: Secondary | ICD-10-CM | POA: Diagnosis not present

## 2022-01-25 DIAGNOSIS — D631 Anemia in chronic kidney disease: Secondary | ICD-10-CM | POA: Diagnosis not present

## 2022-01-25 DIAGNOSIS — N186 End stage renal disease: Secondary | ICD-10-CM | POA: Diagnosis not present

## 2022-01-26 DIAGNOSIS — N186 End stage renal disease: Secondary | ICD-10-CM | POA: Diagnosis not present

## 2022-01-26 DIAGNOSIS — N2581 Secondary hyperparathyroidism of renal origin: Secondary | ICD-10-CM | POA: Diagnosis not present

## 2022-01-26 DIAGNOSIS — N250 Renal osteodystrophy: Secondary | ICD-10-CM | POA: Diagnosis not present

## 2022-01-26 DIAGNOSIS — D631 Anemia in chronic kidney disease: Secondary | ICD-10-CM | POA: Diagnosis not present

## 2022-01-26 DIAGNOSIS — D509 Iron deficiency anemia, unspecified: Secondary | ICD-10-CM | POA: Diagnosis not present

## 2022-01-26 DIAGNOSIS — Z992 Dependence on renal dialysis: Secondary | ICD-10-CM | POA: Diagnosis not present

## 2022-01-27 DIAGNOSIS — D509 Iron deficiency anemia, unspecified: Secondary | ICD-10-CM | POA: Diagnosis not present

## 2022-01-27 DIAGNOSIS — N250 Renal osteodystrophy: Secondary | ICD-10-CM | POA: Diagnosis not present

## 2022-01-27 DIAGNOSIS — D631 Anemia in chronic kidney disease: Secondary | ICD-10-CM | POA: Diagnosis not present

## 2022-01-27 DIAGNOSIS — N2581 Secondary hyperparathyroidism of renal origin: Secondary | ICD-10-CM | POA: Diagnosis not present

## 2022-01-27 DIAGNOSIS — N186 End stage renal disease: Secondary | ICD-10-CM | POA: Diagnosis not present

## 2022-01-27 DIAGNOSIS — Z992 Dependence on renal dialysis: Secondary | ICD-10-CM | POA: Diagnosis not present

## 2022-01-28 DIAGNOSIS — D631 Anemia in chronic kidney disease: Secondary | ICD-10-CM | POA: Diagnosis not present

## 2022-01-28 DIAGNOSIS — N250 Renal osteodystrophy: Secondary | ICD-10-CM | POA: Diagnosis not present

## 2022-01-28 DIAGNOSIS — N2581 Secondary hyperparathyroidism of renal origin: Secondary | ICD-10-CM | POA: Diagnosis not present

## 2022-01-28 DIAGNOSIS — N186 End stage renal disease: Secondary | ICD-10-CM | POA: Diagnosis not present

## 2022-01-28 DIAGNOSIS — Z992 Dependence on renal dialysis: Secondary | ICD-10-CM | POA: Diagnosis not present

## 2022-01-28 DIAGNOSIS — D509 Iron deficiency anemia, unspecified: Secondary | ICD-10-CM | POA: Diagnosis not present

## 2022-01-29 DIAGNOSIS — N2581 Secondary hyperparathyroidism of renal origin: Secondary | ICD-10-CM | POA: Diagnosis not present

## 2022-01-29 DIAGNOSIS — N186 End stage renal disease: Secondary | ICD-10-CM | POA: Diagnosis not present

## 2022-01-29 DIAGNOSIS — Z992 Dependence on renal dialysis: Secondary | ICD-10-CM | POA: Diagnosis not present

## 2022-01-29 DIAGNOSIS — D509 Iron deficiency anemia, unspecified: Secondary | ICD-10-CM | POA: Diagnosis not present

## 2022-01-29 DIAGNOSIS — N250 Renal osteodystrophy: Secondary | ICD-10-CM | POA: Diagnosis not present

## 2022-01-29 DIAGNOSIS — D631 Anemia in chronic kidney disease: Secondary | ICD-10-CM | POA: Diagnosis not present

## 2022-01-30 DIAGNOSIS — Z992 Dependence on renal dialysis: Secondary | ICD-10-CM | POA: Diagnosis not present

## 2022-01-30 DIAGNOSIS — N250 Renal osteodystrophy: Secondary | ICD-10-CM | POA: Diagnosis not present

## 2022-01-30 DIAGNOSIS — N186 End stage renal disease: Secondary | ICD-10-CM | POA: Diagnosis not present

## 2022-01-30 DIAGNOSIS — D509 Iron deficiency anemia, unspecified: Secondary | ICD-10-CM | POA: Diagnosis not present

## 2022-01-30 DIAGNOSIS — N2581 Secondary hyperparathyroidism of renal origin: Secondary | ICD-10-CM | POA: Diagnosis not present

## 2022-01-30 DIAGNOSIS — D631 Anemia in chronic kidney disease: Secondary | ICD-10-CM | POA: Diagnosis not present

## 2022-01-31 DIAGNOSIS — Z992 Dependence on renal dialysis: Secondary | ICD-10-CM | POA: Diagnosis not present

## 2022-01-31 DIAGNOSIS — N250 Renal osteodystrophy: Secondary | ICD-10-CM | POA: Diagnosis not present

## 2022-01-31 DIAGNOSIS — N2581 Secondary hyperparathyroidism of renal origin: Secondary | ICD-10-CM | POA: Diagnosis not present

## 2022-01-31 DIAGNOSIS — N186 End stage renal disease: Secondary | ICD-10-CM | POA: Diagnosis not present

## 2022-01-31 DIAGNOSIS — D509 Iron deficiency anemia, unspecified: Secondary | ICD-10-CM | POA: Diagnosis not present

## 2022-01-31 DIAGNOSIS — D631 Anemia in chronic kidney disease: Secondary | ICD-10-CM | POA: Diagnosis not present

## 2022-02-01 DIAGNOSIS — N186 End stage renal disease: Secondary | ICD-10-CM | POA: Diagnosis not present

## 2022-02-01 DIAGNOSIS — Z992 Dependence on renal dialysis: Secondary | ICD-10-CM | POA: Diagnosis not present

## 2022-02-01 DIAGNOSIS — D509 Iron deficiency anemia, unspecified: Secondary | ICD-10-CM | POA: Diagnosis not present

## 2022-02-01 DIAGNOSIS — D631 Anemia in chronic kidney disease: Secondary | ICD-10-CM | POA: Diagnosis not present

## 2022-02-01 DIAGNOSIS — N2581 Secondary hyperparathyroidism of renal origin: Secondary | ICD-10-CM | POA: Diagnosis not present

## 2022-02-01 DIAGNOSIS — N250 Renal osteodystrophy: Secondary | ICD-10-CM | POA: Diagnosis not present

## 2022-02-02 DIAGNOSIS — D509 Iron deficiency anemia, unspecified: Secondary | ICD-10-CM | POA: Diagnosis not present

## 2022-02-02 DIAGNOSIS — N186 End stage renal disease: Secondary | ICD-10-CM | POA: Diagnosis not present

## 2022-02-02 DIAGNOSIS — N2581 Secondary hyperparathyroidism of renal origin: Secondary | ICD-10-CM | POA: Diagnosis not present

## 2022-02-02 DIAGNOSIS — D631 Anemia in chronic kidney disease: Secondary | ICD-10-CM | POA: Diagnosis not present

## 2022-02-02 DIAGNOSIS — Z992 Dependence on renal dialysis: Secondary | ICD-10-CM | POA: Diagnosis not present

## 2022-02-02 DIAGNOSIS — N250 Renal osteodystrophy: Secondary | ICD-10-CM | POA: Diagnosis not present

## 2022-02-03 DIAGNOSIS — N186 End stage renal disease: Secondary | ICD-10-CM | POA: Diagnosis not present

## 2022-02-03 DIAGNOSIS — D509 Iron deficiency anemia, unspecified: Secondary | ICD-10-CM | POA: Diagnosis not present

## 2022-02-03 DIAGNOSIS — N2581 Secondary hyperparathyroidism of renal origin: Secondary | ICD-10-CM | POA: Diagnosis not present

## 2022-02-03 DIAGNOSIS — Z992 Dependence on renal dialysis: Secondary | ICD-10-CM | POA: Diagnosis not present

## 2022-02-03 DIAGNOSIS — D631 Anemia in chronic kidney disease: Secondary | ICD-10-CM | POA: Diagnosis not present

## 2022-02-03 DIAGNOSIS — N250 Renal osteodystrophy: Secondary | ICD-10-CM | POA: Diagnosis not present

## 2022-02-04 DIAGNOSIS — N186 End stage renal disease: Secondary | ICD-10-CM | POA: Diagnosis not present

## 2022-02-04 DIAGNOSIS — Z992 Dependence on renal dialysis: Secondary | ICD-10-CM | POA: Diagnosis not present

## 2022-02-04 DIAGNOSIS — D631 Anemia in chronic kidney disease: Secondary | ICD-10-CM | POA: Diagnosis not present

## 2022-02-04 DIAGNOSIS — D509 Iron deficiency anemia, unspecified: Secondary | ICD-10-CM | POA: Diagnosis not present

## 2022-02-04 DIAGNOSIS — N250 Renal osteodystrophy: Secondary | ICD-10-CM | POA: Diagnosis not present

## 2022-02-04 DIAGNOSIS — N2581 Secondary hyperparathyroidism of renal origin: Secondary | ICD-10-CM | POA: Diagnosis not present

## 2022-02-05 DIAGNOSIS — N2581 Secondary hyperparathyroidism of renal origin: Secondary | ICD-10-CM | POA: Diagnosis not present

## 2022-02-05 DIAGNOSIS — N186 End stage renal disease: Secondary | ICD-10-CM | POA: Diagnosis not present

## 2022-02-05 DIAGNOSIS — Z992 Dependence on renal dialysis: Secondary | ICD-10-CM | POA: Diagnosis not present

## 2022-02-05 DIAGNOSIS — D631 Anemia in chronic kidney disease: Secondary | ICD-10-CM | POA: Diagnosis not present

## 2022-02-05 DIAGNOSIS — D509 Iron deficiency anemia, unspecified: Secondary | ICD-10-CM | POA: Diagnosis not present

## 2022-02-05 DIAGNOSIS — N250 Renal osteodystrophy: Secondary | ICD-10-CM | POA: Diagnosis not present

## 2022-02-06 DIAGNOSIS — N186 End stage renal disease: Secondary | ICD-10-CM | POA: Diagnosis not present

## 2022-02-06 DIAGNOSIS — D509 Iron deficiency anemia, unspecified: Secondary | ICD-10-CM | POA: Diagnosis not present

## 2022-02-06 DIAGNOSIS — N2581 Secondary hyperparathyroidism of renal origin: Secondary | ICD-10-CM | POA: Diagnosis not present

## 2022-02-06 DIAGNOSIS — N250 Renal osteodystrophy: Secondary | ICD-10-CM | POA: Diagnosis not present

## 2022-02-06 DIAGNOSIS — Z992 Dependence on renal dialysis: Secondary | ICD-10-CM | POA: Diagnosis not present

## 2022-02-06 DIAGNOSIS — D631 Anemia in chronic kidney disease: Secondary | ICD-10-CM | POA: Diagnosis not present

## 2022-02-07 DIAGNOSIS — D509 Iron deficiency anemia, unspecified: Secondary | ICD-10-CM | POA: Diagnosis not present

## 2022-02-07 DIAGNOSIS — N2581 Secondary hyperparathyroidism of renal origin: Secondary | ICD-10-CM | POA: Diagnosis not present

## 2022-02-07 DIAGNOSIS — N250 Renal osteodystrophy: Secondary | ICD-10-CM | POA: Diagnosis not present

## 2022-02-07 DIAGNOSIS — D631 Anemia in chronic kidney disease: Secondary | ICD-10-CM | POA: Diagnosis not present

## 2022-02-07 DIAGNOSIS — N186 End stage renal disease: Secondary | ICD-10-CM | POA: Diagnosis not present

## 2022-02-07 DIAGNOSIS — Z992 Dependence on renal dialysis: Secondary | ICD-10-CM | POA: Diagnosis not present

## 2022-02-08 DIAGNOSIS — N2581 Secondary hyperparathyroidism of renal origin: Secondary | ICD-10-CM | POA: Diagnosis not present

## 2022-02-08 DIAGNOSIS — N186 End stage renal disease: Secondary | ICD-10-CM | POA: Diagnosis not present

## 2022-02-08 DIAGNOSIS — D631 Anemia in chronic kidney disease: Secondary | ICD-10-CM | POA: Diagnosis not present

## 2022-02-08 DIAGNOSIS — D509 Iron deficiency anemia, unspecified: Secondary | ICD-10-CM | POA: Diagnosis not present

## 2022-02-08 DIAGNOSIS — Z992 Dependence on renal dialysis: Secondary | ICD-10-CM | POA: Diagnosis not present

## 2022-02-08 DIAGNOSIS — N250 Renal osteodystrophy: Secondary | ICD-10-CM | POA: Diagnosis not present

## 2022-02-09 DIAGNOSIS — D631 Anemia in chronic kidney disease: Secondary | ICD-10-CM | POA: Diagnosis not present

## 2022-02-09 DIAGNOSIS — D509 Iron deficiency anemia, unspecified: Secondary | ICD-10-CM | POA: Diagnosis not present

## 2022-02-09 DIAGNOSIS — N186 End stage renal disease: Secondary | ICD-10-CM | POA: Diagnosis not present

## 2022-02-09 DIAGNOSIS — N250 Renal osteodystrophy: Secondary | ICD-10-CM | POA: Diagnosis not present

## 2022-02-09 DIAGNOSIS — Z992 Dependence on renal dialysis: Secondary | ICD-10-CM | POA: Diagnosis not present

## 2022-02-09 DIAGNOSIS — N2581 Secondary hyperparathyroidism of renal origin: Secondary | ICD-10-CM | POA: Diagnosis not present

## 2022-02-10 DIAGNOSIS — N250 Renal osteodystrophy: Secondary | ICD-10-CM | POA: Diagnosis not present

## 2022-02-10 DIAGNOSIS — N186 End stage renal disease: Secondary | ICD-10-CM | POA: Diagnosis not present

## 2022-02-10 DIAGNOSIS — Z992 Dependence on renal dialysis: Secondary | ICD-10-CM | POA: Diagnosis not present

## 2022-02-10 DIAGNOSIS — N2581 Secondary hyperparathyroidism of renal origin: Secondary | ICD-10-CM | POA: Diagnosis not present

## 2022-02-10 DIAGNOSIS — D509 Iron deficiency anemia, unspecified: Secondary | ICD-10-CM | POA: Diagnosis not present

## 2022-02-10 DIAGNOSIS — D631 Anemia in chronic kidney disease: Secondary | ICD-10-CM | POA: Diagnosis not present

## 2022-02-11 DIAGNOSIS — N186 End stage renal disease: Secondary | ICD-10-CM | POA: Diagnosis not present

## 2022-02-11 DIAGNOSIS — D509 Iron deficiency anemia, unspecified: Secondary | ICD-10-CM | POA: Diagnosis not present

## 2022-02-11 DIAGNOSIS — D631 Anemia in chronic kidney disease: Secondary | ICD-10-CM | POA: Diagnosis not present

## 2022-02-11 DIAGNOSIS — N2581 Secondary hyperparathyroidism of renal origin: Secondary | ICD-10-CM | POA: Diagnosis not present

## 2022-02-11 DIAGNOSIS — N250 Renal osteodystrophy: Secondary | ICD-10-CM | POA: Diagnosis not present

## 2022-02-11 DIAGNOSIS — Z992 Dependence on renal dialysis: Secondary | ICD-10-CM | POA: Diagnosis not present

## 2022-02-12 DIAGNOSIS — D509 Iron deficiency anemia, unspecified: Secondary | ICD-10-CM | POA: Diagnosis not present

## 2022-02-12 DIAGNOSIS — D631 Anemia in chronic kidney disease: Secondary | ICD-10-CM | POA: Diagnosis not present

## 2022-02-12 DIAGNOSIS — Z992 Dependence on renal dialysis: Secondary | ICD-10-CM | POA: Diagnosis not present

## 2022-02-12 DIAGNOSIS — N186 End stage renal disease: Secondary | ICD-10-CM | POA: Diagnosis not present

## 2022-02-12 DIAGNOSIS — N2581 Secondary hyperparathyroidism of renal origin: Secondary | ICD-10-CM | POA: Diagnosis not present

## 2022-02-12 DIAGNOSIS — N250 Renal osteodystrophy: Secondary | ICD-10-CM | POA: Diagnosis not present

## 2022-02-13 DIAGNOSIS — D509 Iron deficiency anemia, unspecified: Secondary | ICD-10-CM | POA: Diagnosis not present

## 2022-02-13 DIAGNOSIS — D631 Anemia in chronic kidney disease: Secondary | ICD-10-CM | POA: Diagnosis not present

## 2022-02-13 DIAGNOSIS — N186 End stage renal disease: Secondary | ICD-10-CM | POA: Diagnosis not present

## 2022-02-13 DIAGNOSIS — N2581 Secondary hyperparathyroidism of renal origin: Secondary | ICD-10-CM | POA: Diagnosis not present

## 2022-02-13 DIAGNOSIS — Z992 Dependence on renal dialysis: Secondary | ICD-10-CM | POA: Diagnosis not present

## 2022-02-13 DIAGNOSIS — N250 Renal osteodystrophy: Secondary | ICD-10-CM | POA: Diagnosis not present

## 2022-02-14 DIAGNOSIS — N250 Renal osteodystrophy: Secondary | ICD-10-CM | POA: Diagnosis not present

## 2022-02-14 DIAGNOSIS — N2581 Secondary hyperparathyroidism of renal origin: Secondary | ICD-10-CM | POA: Diagnosis not present

## 2022-02-14 DIAGNOSIS — D631 Anemia in chronic kidney disease: Secondary | ICD-10-CM | POA: Diagnosis not present

## 2022-02-14 DIAGNOSIS — Z992 Dependence on renal dialysis: Secondary | ICD-10-CM | POA: Diagnosis not present

## 2022-02-14 DIAGNOSIS — N186 End stage renal disease: Secondary | ICD-10-CM | POA: Diagnosis not present

## 2022-02-14 DIAGNOSIS — D509 Iron deficiency anemia, unspecified: Secondary | ICD-10-CM | POA: Diagnosis not present

## 2022-02-15 DIAGNOSIS — Z23 Encounter for immunization: Secondary | ICD-10-CM | POA: Diagnosis not present

## 2022-02-15 DIAGNOSIS — N250 Renal osteodystrophy: Secondary | ICD-10-CM | POA: Diagnosis not present

## 2022-02-15 DIAGNOSIS — D631 Anemia in chronic kidney disease: Secondary | ICD-10-CM | POA: Diagnosis not present

## 2022-02-15 DIAGNOSIS — D509 Iron deficiency anemia, unspecified: Secondary | ICD-10-CM | POA: Diagnosis not present

## 2022-02-15 DIAGNOSIS — N2581 Secondary hyperparathyroidism of renal origin: Secondary | ICD-10-CM | POA: Diagnosis not present

## 2022-02-15 DIAGNOSIS — Z992 Dependence on renal dialysis: Secondary | ICD-10-CM | POA: Diagnosis not present

## 2022-02-15 DIAGNOSIS — N186 End stage renal disease: Secondary | ICD-10-CM | POA: Diagnosis not present

## 2022-02-16 DIAGNOSIS — N2581 Secondary hyperparathyroidism of renal origin: Secondary | ICD-10-CM | POA: Diagnosis not present

## 2022-02-16 DIAGNOSIS — Z992 Dependence on renal dialysis: Secondary | ICD-10-CM | POA: Diagnosis not present

## 2022-02-16 DIAGNOSIS — D509 Iron deficiency anemia, unspecified: Secondary | ICD-10-CM | POA: Diagnosis not present

## 2022-02-16 DIAGNOSIS — N186 End stage renal disease: Secondary | ICD-10-CM | POA: Diagnosis not present

## 2022-02-16 DIAGNOSIS — D631 Anemia in chronic kidney disease: Secondary | ICD-10-CM | POA: Diagnosis not present

## 2022-02-16 DIAGNOSIS — N250 Renal osteodystrophy: Secondary | ICD-10-CM | POA: Diagnosis not present

## 2022-02-17 DIAGNOSIS — N2581 Secondary hyperparathyroidism of renal origin: Secondary | ICD-10-CM | POA: Diagnosis not present

## 2022-02-17 DIAGNOSIS — D509 Iron deficiency anemia, unspecified: Secondary | ICD-10-CM | POA: Diagnosis not present

## 2022-02-17 DIAGNOSIS — D631 Anemia in chronic kidney disease: Secondary | ICD-10-CM | POA: Diagnosis not present

## 2022-02-17 DIAGNOSIS — N186 End stage renal disease: Secondary | ICD-10-CM | POA: Diagnosis not present

## 2022-02-17 DIAGNOSIS — N250 Renal osteodystrophy: Secondary | ICD-10-CM | POA: Diagnosis not present

## 2022-02-17 DIAGNOSIS — Z992 Dependence on renal dialysis: Secondary | ICD-10-CM | POA: Diagnosis not present

## 2022-02-18 DIAGNOSIS — D509 Iron deficiency anemia, unspecified: Secondary | ICD-10-CM | POA: Diagnosis not present

## 2022-02-18 DIAGNOSIS — N250 Renal osteodystrophy: Secondary | ICD-10-CM | POA: Diagnosis not present

## 2022-02-18 DIAGNOSIS — N2581 Secondary hyperparathyroidism of renal origin: Secondary | ICD-10-CM | POA: Diagnosis not present

## 2022-02-18 DIAGNOSIS — N186 End stage renal disease: Secondary | ICD-10-CM | POA: Diagnosis not present

## 2022-02-18 DIAGNOSIS — D631 Anemia in chronic kidney disease: Secondary | ICD-10-CM | POA: Diagnosis not present

## 2022-02-18 DIAGNOSIS — Z992 Dependence on renal dialysis: Secondary | ICD-10-CM | POA: Diagnosis not present

## 2022-02-19 DIAGNOSIS — N186 End stage renal disease: Secondary | ICD-10-CM | POA: Diagnosis not present

## 2022-02-19 DIAGNOSIS — D631 Anemia in chronic kidney disease: Secondary | ICD-10-CM | POA: Diagnosis not present

## 2022-02-19 DIAGNOSIS — N250 Renal osteodystrophy: Secondary | ICD-10-CM | POA: Diagnosis not present

## 2022-02-19 DIAGNOSIS — Z992 Dependence on renal dialysis: Secondary | ICD-10-CM | POA: Diagnosis not present

## 2022-02-19 DIAGNOSIS — N2581 Secondary hyperparathyroidism of renal origin: Secondary | ICD-10-CM | POA: Diagnosis not present

## 2022-02-19 DIAGNOSIS — D509 Iron deficiency anemia, unspecified: Secondary | ICD-10-CM | POA: Diagnosis not present

## 2022-02-20 DIAGNOSIS — D509 Iron deficiency anemia, unspecified: Secondary | ICD-10-CM | POA: Diagnosis not present

## 2022-02-20 DIAGNOSIS — Z992 Dependence on renal dialysis: Secondary | ICD-10-CM | POA: Diagnosis not present

## 2022-02-20 DIAGNOSIS — N2581 Secondary hyperparathyroidism of renal origin: Secondary | ICD-10-CM | POA: Diagnosis not present

## 2022-02-20 DIAGNOSIS — D631 Anemia in chronic kidney disease: Secondary | ICD-10-CM | POA: Diagnosis not present

## 2022-02-20 DIAGNOSIS — N250 Renal osteodystrophy: Secondary | ICD-10-CM | POA: Diagnosis not present

## 2022-02-20 DIAGNOSIS — N186 End stage renal disease: Secondary | ICD-10-CM | POA: Diagnosis not present

## 2022-02-21 DIAGNOSIS — N2581 Secondary hyperparathyroidism of renal origin: Secondary | ICD-10-CM | POA: Diagnosis not present

## 2022-02-21 DIAGNOSIS — N250 Renal osteodystrophy: Secondary | ICD-10-CM | POA: Diagnosis not present

## 2022-02-21 DIAGNOSIS — D631 Anemia in chronic kidney disease: Secondary | ICD-10-CM | POA: Diagnosis not present

## 2022-02-21 DIAGNOSIS — D509 Iron deficiency anemia, unspecified: Secondary | ICD-10-CM | POA: Diagnosis not present

## 2022-02-21 DIAGNOSIS — N186 End stage renal disease: Secondary | ICD-10-CM | POA: Diagnosis not present

## 2022-02-21 DIAGNOSIS — Z992 Dependence on renal dialysis: Secondary | ICD-10-CM | POA: Diagnosis not present

## 2022-02-22 DIAGNOSIS — Z992 Dependence on renal dialysis: Secondary | ICD-10-CM | POA: Diagnosis not present

## 2022-02-22 DIAGNOSIS — N186 End stage renal disease: Secondary | ICD-10-CM | POA: Diagnosis not present

## 2022-02-22 DIAGNOSIS — Z23 Encounter for immunization: Secondary | ICD-10-CM | POA: Diagnosis not present

## 2022-02-22 DIAGNOSIS — D509 Iron deficiency anemia, unspecified: Secondary | ICD-10-CM | POA: Diagnosis not present

## 2022-02-22 DIAGNOSIS — D631 Anemia in chronic kidney disease: Secondary | ICD-10-CM | POA: Diagnosis not present

## 2022-02-23 DIAGNOSIS — N186 End stage renal disease: Secondary | ICD-10-CM | POA: Diagnosis not present

## 2022-02-23 DIAGNOSIS — D509 Iron deficiency anemia, unspecified: Secondary | ICD-10-CM | POA: Diagnosis not present

## 2022-02-23 DIAGNOSIS — Z23 Encounter for immunization: Secondary | ICD-10-CM | POA: Diagnosis not present

## 2022-02-23 DIAGNOSIS — Z992 Dependence on renal dialysis: Secondary | ICD-10-CM | POA: Diagnosis not present

## 2022-02-23 DIAGNOSIS — D631 Anemia in chronic kidney disease: Secondary | ICD-10-CM | POA: Diagnosis not present

## 2022-02-24 DIAGNOSIS — Z23 Encounter for immunization: Secondary | ICD-10-CM | POA: Diagnosis not present

## 2022-02-24 DIAGNOSIS — Z992 Dependence on renal dialysis: Secondary | ICD-10-CM | POA: Diagnosis not present

## 2022-02-24 DIAGNOSIS — N186 End stage renal disease: Secondary | ICD-10-CM | POA: Diagnosis not present

## 2022-02-24 DIAGNOSIS — D509 Iron deficiency anemia, unspecified: Secondary | ICD-10-CM | POA: Diagnosis not present

## 2022-02-24 DIAGNOSIS — D631 Anemia in chronic kidney disease: Secondary | ICD-10-CM | POA: Diagnosis not present

## 2022-02-25 DIAGNOSIS — D509 Iron deficiency anemia, unspecified: Secondary | ICD-10-CM | POA: Diagnosis not present

## 2022-02-25 DIAGNOSIS — Z23 Encounter for immunization: Secondary | ICD-10-CM | POA: Diagnosis not present

## 2022-02-25 DIAGNOSIS — D631 Anemia in chronic kidney disease: Secondary | ICD-10-CM | POA: Diagnosis not present

## 2022-02-25 DIAGNOSIS — N186 End stage renal disease: Secondary | ICD-10-CM | POA: Diagnosis not present

## 2022-02-25 DIAGNOSIS — Z992 Dependence on renal dialysis: Secondary | ICD-10-CM | POA: Diagnosis not present

## 2022-02-26 DIAGNOSIS — D509 Iron deficiency anemia, unspecified: Secondary | ICD-10-CM | POA: Diagnosis not present

## 2022-02-26 DIAGNOSIS — N186 End stage renal disease: Secondary | ICD-10-CM | POA: Diagnosis not present

## 2022-02-26 DIAGNOSIS — D631 Anemia in chronic kidney disease: Secondary | ICD-10-CM | POA: Diagnosis not present

## 2022-02-26 DIAGNOSIS — Z23 Encounter for immunization: Secondary | ICD-10-CM | POA: Diagnosis not present

## 2022-02-26 DIAGNOSIS — Z992 Dependence on renal dialysis: Secondary | ICD-10-CM | POA: Diagnosis not present

## 2022-02-27 DIAGNOSIS — Z992 Dependence on renal dialysis: Secondary | ICD-10-CM | POA: Diagnosis not present

## 2022-02-27 DIAGNOSIS — D509 Iron deficiency anemia, unspecified: Secondary | ICD-10-CM | POA: Diagnosis not present

## 2022-02-27 DIAGNOSIS — D631 Anemia in chronic kidney disease: Secondary | ICD-10-CM | POA: Diagnosis not present

## 2022-02-27 DIAGNOSIS — Z23 Encounter for immunization: Secondary | ICD-10-CM | POA: Diagnosis not present

## 2022-02-27 DIAGNOSIS — N186 End stage renal disease: Secondary | ICD-10-CM | POA: Diagnosis not present

## 2022-02-28 DIAGNOSIS — Z992 Dependence on renal dialysis: Secondary | ICD-10-CM | POA: Diagnosis not present

## 2022-02-28 DIAGNOSIS — Z23 Encounter for immunization: Secondary | ICD-10-CM | POA: Diagnosis not present

## 2022-02-28 DIAGNOSIS — D631 Anemia in chronic kidney disease: Secondary | ICD-10-CM | POA: Diagnosis not present

## 2022-02-28 DIAGNOSIS — N186 End stage renal disease: Secondary | ICD-10-CM | POA: Diagnosis not present

## 2022-02-28 DIAGNOSIS — D509 Iron deficiency anemia, unspecified: Secondary | ICD-10-CM | POA: Diagnosis not present

## 2022-03-01 DIAGNOSIS — D509 Iron deficiency anemia, unspecified: Secondary | ICD-10-CM | POA: Diagnosis not present

## 2022-03-01 DIAGNOSIS — D631 Anemia in chronic kidney disease: Secondary | ICD-10-CM | POA: Diagnosis not present

## 2022-03-01 DIAGNOSIS — Z23 Encounter for immunization: Secondary | ICD-10-CM | POA: Diagnosis not present

## 2022-03-01 DIAGNOSIS — Z992 Dependence on renal dialysis: Secondary | ICD-10-CM | POA: Diagnosis not present

## 2022-03-01 DIAGNOSIS — N186 End stage renal disease: Secondary | ICD-10-CM | POA: Diagnosis not present

## 2022-03-02 DIAGNOSIS — D631 Anemia in chronic kidney disease: Secondary | ICD-10-CM | POA: Diagnosis not present

## 2022-03-02 DIAGNOSIS — Z23 Encounter for immunization: Secondary | ICD-10-CM | POA: Diagnosis not present

## 2022-03-02 DIAGNOSIS — N186 End stage renal disease: Secondary | ICD-10-CM | POA: Diagnosis not present

## 2022-03-02 DIAGNOSIS — Z992 Dependence on renal dialysis: Secondary | ICD-10-CM | POA: Diagnosis not present

## 2022-03-02 DIAGNOSIS — D509 Iron deficiency anemia, unspecified: Secondary | ICD-10-CM | POA: Diagnosis not present

## 2022-03-03 DIAGNOSIS — D509 Iron deficiency anemia, unspecified: Secondary | ICD-10-CM | POA: Diagnosis not present

## 2022-03-03 DIAGNOSIS — N186 End stage renal disease: Secondary | ICD-10-CM | POA: Diagnosis not present

## 2022-03-03 DIAGNOSIS — Z992 Dependence on renal dialysis: Secondary | ICD-10-CM | POA: Diagnosis not present

## 2022-03-03 DIAGNOSIS — Z23 Encounter for immunization: Secondary | ICD-10-CM | POA: Diagnosis not present

## 2022-03-03 DIAGNOSIS — D631 Anemia in chronic kidney disease: Secondary | ICD-10-CM | POA: Diagnosis not present

## 2022-03-04 DIAGNOSIS — Z23 Encounter for immunization: Secondary | ICD-10-CM | POA: Diagnosis not present

## 2022-03-04 DIAGNOSIS — D509 Iron deficiency anemia, unspecified: Secondary | ICD-10-CM | POA: Diagnosis not present

## 2022-03-04 DIAGNOSIS — D631 Anemia in chronic kidney disease: Secondary | ICD-10-CM | POA: Diagnosis not present

## 2022-03-04 DIAGNOSIS — N186 End stage renal disease: Secondary | ICD-10-CM | POA: Diagnosis not present

## 2022-03-04 DIAGNOSIS — Z992 Dependence on renal dialysis: Secondary | ICD-10-CM | POA: Diagnosis not present

## 2022-03-05 DIAGNOSIS — D509 Iron deficiency anemia, unspecified: Secondary | ICD-10-CM | POA: Diagnosis not present

## 2022-03-05 DIAGNOSIS — D631 Anemia in chronic kidney disease: Secondary | ICD-10-CM | POA: Diagnosis not present

## 2022-03-05 DIAGNOSIS — N186 End stage renal disease: Secondary | ICD-10-CM | POA: Diagnosis not present

## 2022-03-05 DIAGNOSIS — Z23 Encounter for immunization: Secondary | ICD-10-CM | POA: Diagnosis not present

## 2022-03-05 DIAGNOSIS — Z992 Dependence on renal dialysis: Secondary | ICD-10-CM | POA: Diagnosis not present

## 2022-03-06 DIAGNOSIS — D631 Anemia in chronic kidney disease: Secondary | ICD-10-CM | POA: Diagnosis not present

## 2022-03-06 DIAGNOSIS — Z992 Dependence on renal dialysis: Secondary | ICD-10-CM | POA: Diagnosis not present

## 2022-03-06 DIAGNOSIS — Z23 Encounter for immunization: Secondary | ICD-10-CM | POA: Diagnosis not present

## 2022-03-06 DIAGNOSIS — N186 End stage renal disease: Secondary | ICD-10-CM | POA: Diagnosis not present

## 2022-03-06 DIAGNOSIS — D509 Iron deficiency anemia, unspecified: Secondary | ICD-10-CM | POA: Diagnosis not present

## 2022-03-07 DIAGNOSIS — D509 Iron deficiency anemia, unspecified: Secondary | ICD-10-CM | POA: Diagnosis not present

## 2022-03-07 DIAGNOSIS — Z23 Encounter for immunization: Secondary | ICD-10-CM | POA: Diagnosis not present

## 2022-03-07 DIAGNOSIS — Z992 Dependence on renal dialysis: Secondary | ICD-10-CM | POA: Diagnosis not present

## 2022-03-07 DIAGNOSIS — N186 End stage renal disease: Secondary | ICD-10-CM | POA: Diagnosis not present

## 2022-03-07 DIAGNOSIS — D631 Anemia in chronic kidney disease: Secondary | ICD-10-CM | POA: Diagnosis not present

## 2022-03-08 DIAGNOSIS — Z992 Dependence on renal dialysis: Secondary | ICD-10-CM | POA: Diagnosis not present

## 2022-03-08 DIAGNOSIS — D509 Iron deficiency anemia, unspecified: Secondary | ICD-10-CM | POA: Diagnosis not present

## 2022-03-08 DIAGNOSIS — D631 Anemia in chronic kidney disease: Secondary | ICD-10-CM | POA: Diagnosis not present

## 2022-03-08 DIAGNOSIS — Z23 Encounter for immunization: Secondary | ICD-10-CM | POA: Diagnosis not present

## 2022-03-08 DIAGNOSIS — N186 End stage renal disease: Secondary | ICD-10-CM | POA: Diagnosis not present

## 2022-03-09 DIAGNOSIS — D631 Anemia in chronic kidney disease: Secondary | ICD-10-CM | POA: Diagnosis not present

## 2022-03-09 DIAGNOSIS — Z23 Encounter for immunization: Secondary | ICD-10-CM | POA: Diagnosis not present

## 2022-03-09 DIAGNOSIS — Z992 Dependence on renal dialysis: Secondary | ICD-10-CM | POA: Diagnosis not present

## 2022-03-09 DIAGNOSIS — N186 End stage renal disease: Secondary | ICD-10-CM | POA: Diagnosis not present

## 2022-03-09 DIAGNOSIS — D509 Iron deficiency anemia, unspecified: Secondary | ICD-10-CM | POA: Diagnosis not present

## 2022-03-10 DIAGNOSIS — N186 End stage renal disease: Secondary | ICD-10-CM | POA: Diagnosis not present

## 2022-03-10 DIAGNOSIS — Z992 Dependence on renal dialysis: Secondary | ICD-10-CM | POA: Diagnosis not present

## 2022-03-10 DIAGNOSIS — Z23 Encounter for immunization: Secondary | ICD-10-CM | POA: Diagnosis not present

## 2022-03-10 DIAGNOSIS — D509 Iron deficiency anemia, unspecified: Secondary | ICD-10-CM | POA: Diagnosis not present

## 2022-03-10 DIAGNOSIS — D631 Anemia in chronic kidney disease: Secondary | ICD-10-CM | POA: Diagnosis not present

## 2022-03-11 DIAGNOSIS — N186 End stage renal disease: Secondary | ICD-10-CM | POA: Diagnosis not present

## 2022-03-11 DIAGNOSIS — Z992 Dependence on renal dialysis: Secondary | ICD-10-CM | POA: Diagnosis not present

## 2022-03-11 DIAGNOSIS — Z23 Encounter for immunization: Secondary | ICD-10-CM | POA: Diagnosis not present

## 2022-03-11 DIAGNOSIS — D509 Iron deficiency anemia, unspecified: Secondary | ICD-10-CM | POA: Diagnosis not present

## 2022-03-11 DIAGNOSIS — D631 Anemia in chronic kidney disease: Secondary | ICD-10-CM | POA: Diagnosis not present

## 2022-03-12 DIAGNOSIS — Z23 Encounter for immunization: Secondary | ICD-10-CM | POA: Diagnosis not present

## 2022-03-12 DIAGNOSIS — D631 Anemia in chronic kidney disease: Secondary | ICD-10-CM | POA: Diagnosis not present

## 2022-03-12 DIAGNOSIS — N186 End stage renal disease: Secondary | ICD-10-CM | POA: Diagnosis not present

## 2022-03-12 DIAGNOSIS — D509 Iron deficiency anemia, unspecified: Secondary | ICD-10-CM | POA: Diagnosis not present

## 2022-03-12 DIAGNOSIS — Z992 Dependence on renal dialysis: Secondary | ICD-10-CM | POA: Diagnosis not present

## 2022-03-13 ENCOUNTER — Emergency Department: Payer: Medicare Other

## 2022-03-13 ENCOUNTER — Observation Stay
Admission: EM | Admit: 2022-03-13 | Discharge: 2022-03-15 | Disposition: A | Discharge status: Home Health | Payer: Medicare Other | Attending: Internal Medicine | Admitting: Internal Medicine

## 2022-03-13 DIAGNOSIS — Z79899 Other long term (current) drug therapy: Secondary | ICD-10-CM | POA: Diagnosis not present

## 2022-03-13 DIAGNOSIS — Z992 Dependence on renal dialysis: Secondary | ICD-10-CM | POA: Diagnosis not present

## 2022-03-13 DIAGNOSIS — E785 Hyperlipidemia, unspecified: Secondary | ICD-10-CM | POA: Diagnosis not present

## 2022-03-13 DIAGNOSIS — Z794 Long term (current) use of insulin: Secondary | ICD-10-CM | POA: Insufficient documentation

## 2022-03-13 DIAGNOSIS — I951 Orthostatic hypotension: Principal | ICD-10-CM

## 2022-03-13 DIAGNOSIS — E860 Dehydration: Secondary | ICD-10-CM | POA: Diagnosis not present

## 2022-03-13 DIAGNOSIS — R0602 Shortness of breath: Secondary | ICD-10-CM | POA: Diagnosis not present

## 2022-03-13 DIAGNOSIS — N186 End stage renal disease: Secondary | ICD-10-CM | POA: Insufficient documentation

## 2022-03-13 DIAGNOSIS — Z87891 Personal history of nicotine dependence: Secondary | ICD-10-CM | POA: Diagnosis not present

## 2022-03-13 DIAGNOSIS — Z7982 Long term (current) use of aspirin: Secondary | ICD-10-CM | POA: Insufficient documentation

## 2022-03-13 DIAGNOSIS — I120 Hypertensive chronic kidney disease with stage 5 chronic kidney disease or end stage renal disease: Secondary | ICD-10-CM | POA: Insufficient documentation

## 2022-03-13 DIAGNOSIS — E1142 Type 2 diabetes mellitus with diabetic polyneuropathy: Secondary | ICD-10-CM | POA: Insufficient documentation

## 2022-03-13 DIAGNOSIS — D631 Anemia in chronic kidney disease: Secondary | ICD-10-CM | POA: Diagnosis not present

## 2022-03-13 DIAGNOSIS — R7989 Other specified abnormal findings of blood chemistry: Secondary | ICD-10-CM | POA: Diagnosis not present

## 2022-03-13 DIAGNOSIS — Z951 Presence of aortocoronary bypass graft: Secondary | ICD-10-CM | POA: Insufficient documentation

## 2022-03-13 DIAGNOSIS — Z955 Presence of coronary angioplasty implant and graft: Secondary | ICD-10-CM | POA: Insufficient documentation

## 2022-03-13 DIAGNOSIS — I2510 Atherosclerotic heart disease of native coronary artery without angina pectoris: Secondary | ICD-10-CM | POA: Insufficient documentation

## 2022-03-13 DIAGNOSIS — R531 Weakness: Secondary | ICD-10-CM | POA: Diagnosis present

## 2022-03-13 DIAGNOSIS — R55 Syncope and collapse: Secondary | ICD-10-CM | POA: Diagnosis not present

## 2022-03-13 DIAGNOSIS — E871 Hypo-osmolality and hyponatremia: Secondary | ICD-10-CM | POA: Insufficient documentation

## 2022-03-13 DIAGNOSIS — Z85828 Personal history of other malignant neoplasm of skin: Secondary | ICD-10-CM | POA: Diagnosis not present

## 2022-03-13 DIAGNOSIS — E1122 Type 2 diabetes mellitus with diabetic chronic kidney disease: Secondary | ICD-10-CM | POA: Insufficient documentation

## 2022-03-13 DIAGNOSIS — I959 Hypotension, unspecified: Secondary | ICD-10-CM | POA: Diagnosis not present

## 2022-03-13 DIAGNOSIS — E1169 Type 2 diabetes mellitus with other specified complication: Secondary | ICD-10-CM | POA: Diagnosis not present

## 2022-03-13 DIAGNOSIS — D638 Anemia in other chronic diseases classified elsewhere: Secondary | ICD-10-CM | POA: Diagnosis present

## 2022-03-13 DIAGNOSIS — E1140 Type 2 diabetes mellitus with diabetic neuropathy, unspecified: Secondary | ICD-10-CM | POA: Insufficient documentation

## 2022-03-13 LAB — CBC WITH DIFFERENTIAL/PLATELET
Abs Immature Granulocytes: 0.02 10*3/uL (ref 0.00–0.07)
Basophils Absolute: 0 10*3/uL (ref 0.0–0.1)
Basophils Relative: 0 %
Eosinophils Absolute: 0 10*3/uL (ref 0.0–0.5)
Eosinophils Relative: 0 %
HCT: 36.6 % — ABNORMAL LOW (ref 39.0–52.0)
Hemoglobin: 12.4 g/dL — ABNORMAL LOW (ref 13.0–17.0)
Immature Granulocytes: 0 %
Lymphocytes Relative: 14 %
Lymphs Abs: 1.3 10*3/uL (ref 0.7–4.0)
MCH: 30 pg (ref 26.0–34.0)
MCHC: 33.9 g/dL (ref 30.0–36.0)
MCV: 88.6 fL (ref 80.0–100.0)
Monocytes Absolute: 0.5 10*3/uL (ref 0.1–1.0)
Monocytes Relative: 5 %
Neutro Abs: 7.5 10*3/uL (ref 1.7–7.7)
Neutrophils Relative %: 81 %
Platelets: 267 10*3/uL (ref 150–400)
RBC: 4.13 MIL/uL — ABNORMAL LOW (ref 4.22–5.81)
RDW: 13.7 % (ref 11.5–15.5)
WBC: 9.4 10*3/uL (ref 4.0–10.5)
nRBC: 0 % (ref 0.0–0.2)

## 2022-03-13 LAB — CBG MONITORING, ED: Glucose-Capillary: 86 mg/dL (ref 70–99)

## 2022-03-13 LAB — COMPREHENSIVE METABOLIC PANEL
ALT: 38 U/L (ref 0–44)
AST: 45 U/L — ABNORMAL HIGH (ref 15–41)
Albumin: 2.3 g/dL — ABNORMAL LOW (ref 3.5–5.0)
Alkaline Phosphatase: 107 U/L (ref 38–126)
Anion gap: 11 (ref 5–15)
BUN: 38 mg/dL — ABNORMAL HIGH (ref 8–23)
CO2: 27 mmol/L (ref 22–32)
Calcium: 9.1 mg/dL (ref 8.9–10.3)
Chloride: 96 mmol/L — ABNORMAL LOW (ref 98–111)
Creatinine, Ser: 4.22 mg/dL — ABNORMAL HIGH (ref 0.61–1.24)
GFR, Estimated: 14 mL/min — ABNORMAL LOW (ref >60)
Glucose, Bld: 171 mg/dL — ABNORMAL HIGH (ref 70–99)
Potassium: 4.3 mmol/L (ref 3.5–5.1)
Sodium: 134 mmol/L — ABNORMAL LOW (ref 135–145)
Total Bilirubin: 0.7 mg/dL (ref 0.3–1.2)
Total Protein: 6 g/dL — ABNORMAL LOW (ref 6.5–8.1)

## 2022-03-13 LAB — MAGNESIUM: Magnesium: 1.5 mg/dL — ABNORMAL LOW (ref 1.7–2.4)

## 2022-03-13 LAB — TROPONIN I (HIGH SENSITIVITY)
Troponin I (High Sensitivity): 157 ng/L (ref <18)
Troponin I (High Sensitivity): 94 ng/L — ABNORMAL HIGH (ref <18)

## 2022-03-13 LAB — TSH: TSH: 1.645 u[IU]/mL (ref 0.350–4.500)

## 2022-03-13 MED ORDER — MIDODRINE HCL 5 MG PO TABS: 10.0000 mg | ORAL_TABLET | Freq: Three times a day (TID) | ORAL | Status: DC

## 2022-03-13 MED ORDER — INSULIN ASPART 100 UNIT/ML IJ SOLN: 0.0000 [IU] | Freq: Every day | INTRAMUSCULAR | Status: DC

## 2022-03-13 MED ORDER — SODIUM CHLORIDE 0.9 % IV BOLUS
500.0000 mL | Freq: Once | INTRAVENOUS | Status: AC
  Administered 2022-03-13: 500 mL via INTRAVENOUS

## 2022-03-13 MED ORDER — MIDODRINE HCL 5 MG PO TABS
5.0000 mg | ORAL_TABLET | Freq: Three times a day (TID) | ORAL | Status: DC
  Administered 2022-03-13 – 2022-03-14 (×2): 5 mg via ORAL
  Filled 2022-03-13 (×2): qty 1

## 2022-03-13 MED ORDER — INSULIN GLARGINE-YFGN 100 UNIT/ML ~~LOC~~ SOLN
25.0000 [IU] | Freq: Every day | SUBCUTANEOUS | Status: DC
  Filled 2022-03-13 (×3): qty 0.25

## 2022-03-13 MED ORDER — INSULIN ASPART 100 UNIT/ML IJ SOLN: 0.0000 [IU] | Freq: Three times a day (TID) | INTRAMUSCULAR | Status: DC

## 2022-03-13 NOTE — ED Notes (Signed)
No chest pain or sob per pt.

## 2022-03-13 NOTE — ED Notes (Signed)
Pt reports dizziness and weakness for months with low blood pressure.  Pt does peritoneal dialysis every day at home.  No n/v/d  pt states I've been like this for months.  No chest pain or sob.  Pt alert  speech clear.  Pt in hallway bed  family at bedside.

## 2022-03-13 NOTE — H&P (Addendum)
History and Physical    Roger Knight VZD:[id number] DOB: Apr 11, 1951 DOA: 03/13/2022  I have briefly reviewed the patient's prior medical records in [place]  PCP: Leonel Ramsay, MD  Patient coming from: home  Chief Complaint: pre-syncope, weakness  HPI: Roger Knight is a 71 y.o. male with medical history significant of ESRD on peritoneal dialysis for the past 1.5 years, CAD status post CABG, DM 2, HTN, HLD, who comes to the hospital with complaints of progressive weakness, weight loss, acute on chronic over the last several months but especially more so this morning when he was extremely weak, his legs gave out and he almost passed out.  He denies any chest pain, denies any shortness of breath, no palpitations.  Felt like his legs became very weak, denies any focal weakness but it was more generalized.  He reports poor p.o. intake over the last several months, resulting in a 60 pound weight loss since January 2023.  He was also found to be more hypotensive as an outpatient and was recently started on midodrine.  Due to presyncopal episode this morning he decided to come to the hospital.  He denies any fever or chills, reports that his PD dialysis catheter is working well.  No abdominal pain  ED Course: In the ED initially he was hypotensive in the 90s but improved into the low 100s.  He is afebrile.  Blood work shows an elevated high-sensitivity troponin of 157, creatinine is 4.2, magnesium 1.5.  Sodium is 134.  CBC fairly unremarkable, hemoglobin is 12.4.  Chest x-ray is unremarkable.  Review of Systems: All systems reviewed, and apart from HPI, all negative  Past Medical History:  Diagnosis Date   Accelerating angina ([place]) 02/11/2014   Anemia    Arthritis    Carcinoma ([place]) 2012    skin cancer on neck [facility]   Chronic kidney disease 2016   Diabetes mellitus without complication (HCC)    GERD (gastroesophageal reflux disease)    Hemorrhoids 2016-17   Hernia     Hyperlipidemia    Hypertension    Skin cancer     Past Surgical History:  Procedure Laterality Date   COLONOSCOPY  April 2014   COLONOSCOPY N/A 05/13/2015   Procedure: COLONOSCOPY;  Surgeon: Josefine Class, MD;  Location: [facility] ENDOSCOPY;  Service: Endoscopy;  Laterality: N/A;   COLONOSCOPY WITH PROPOFOL N/A 05/04/2021   Procedure: COLONOSCOPY WITH PROPOFOL;  Surgeon: Robert Bellow, MD;  Location: [facility] ENDOSCOPY;  Service: Endoscopy;  Laterality: N/A;  DM   CORONARY ARTERY BYPASS GRAFT  1996   [facility]   CORONARY STENT PLACEMENT  2011   [facility]   ESOPHAGOGASTRODUODENOSCOPY (EGD) WITH PROPOFOL N/A 05/13/2015   Procedure: ESOPHAGOGASTRODUODENOSCOPY (EGD) WITH PROPOFOL;  Surgeon: Josefine Class, MD;  Location: [facility] ENDOSCOPY;  Service: Endoscopy;  Laterality: N/A;   LEFT HEART CATH AND CORS/GRAFTS ANGIOGRAPHY N/A 01/24/2021   Procedure: LEFT HEART CATH AND CORS/GRAFTS ANGIOGRAPHY;  Surgeon: Andrez Grime, MD;  Location: [place] CV LAB;  Service: Cardiovascular;  Laterality: N/A;   UPPER GI ENDOSCOPY  2014     reports that he quit smoking about 16 years ago. His smoking use included cigarettes. He has quit using smokeless tobacco.  His smokeless tobacco use included chew. He reports that he does not drink alcohol and does not use drugs.  No Known Allergies  Family History  Problem Relation Age of Onset   Dementia Mother 52   Osteoarthritis Mother  Cancer Mother     Prior to Admission medications   Medication Sig Start Date End Date Taking? Authorizing Provider  amLODipine (NORVASC) 5 MG tablet Take 5 mg by mouth daily.  Patient not taking: Reported on 11/22/2021    [provider]  aspirin 81 MG tablet Take 81 mg by mouth daily.    [provider]  atorvastatin (LIPITOR) 80 MG tablet Take 80 mg by mouth daily.    [provider]  calcitRIOL (ROCALTROL) 0.25 MCG capsule Take 0.25 mcg by mouth daily.  11/08/16   [provider]   Calcium Polycarbophil (FIBER) 625 MG TABS Take 2 capsules by mouth in the morning and at bedtime.    [provider]  cetirizine (ZYRTEC) 10 MG tablet Take 10 mg by mouth daily.    [provider]  ezetimibe (ZETIA) 10 MG tablet Take 10 mg by mouth daily.    [provider]  gabapentin (NEURONTIN) 300 MG capsule Take 300 mg by mouth at bedtime. 06/28/16   [provider]  insulin aspart (NOVOLOG) 100 UNIT/ML injection Inject 20 Units into the skin 3 (three) times daily.     [provider]  Insulin Pen Needle 32G X 4 MM MISC USE WITH INJECTIONS 4 TIMES DAILY 10/18/15   [provider]  metoprolol (TOPROL-XL) 200 MG 24 hr tablet Take 200 mg by mouth daily.    [provider]  Multiple Vitamins-Minerals (MULTIVITAMIN WITH MINERALS) tablet Take 1 tablet by mouth daily.    [provider]  pantoprazole (PROTONIX) 40 MG tablet Take 40 mg by mouth 2 (two) times daily before a meal.     [provider]  sildenafil (VIAGRA) 100 MG tablet Take 100 mg by mouth as needed for erectile dysfunction. Reported on 06/14/2015    [provider]  spironolactone (ALDACTONE) 25 MG tablet Take 25 mg by mouth 2 (two) times daily.    [provider]  TRESIBA FLEXTOUCH 200 UNIT/ML SOPN Inject 90 Units into the skin at bedtime. 02/22/18   [provider]  triamcinolone cream (KENALOG) 0.1 % Apply 1 application topically 2 (two) times daily as needed (irritation).    [provider]  valsartan-hydrochlorothiazide (DIOVAN-HCT) 320-25 MG per tablet Take 1 tablet by mouth daily.    [provider]    Physical Exam: Vitals:   03/13/22 1136 03/13/22 1139 03/13/22 1556 03/13/22 1659  BP: 95/67  117/74 134/72  Pulse:   (!) 51 (!) 55  Resp:   20 20  Temp:   97.9 F (36.6 C) 97.9 F (36.6 C)  TempSrc:   Oral Oral  SpO2:   97% 97%  Weight: 73.5 kg 73.5 kg    Height: '5\' 7"'$  (1.702 m) '5\' 7"'$  (1.702 m)         Constitutional: NAD, calm, comfortable Eyes: PERRL, lids and conjunctivae normal ENMT: Mucous membranes are moist. Posterior pharynx clear of any exudate or lesions.Normal dentition.  Neck: normal, supple Respiratory: clear to auscultation bilaterally, no wheezing, no crackles. Normal respiratory effort. No accessory muscle use.  Cardiovascular: Regular rate and rhythm, no murmurs / rubs / gallops. No extremity edema. 2+ pedal pulses.  Abdomen: no tenderness, no masses palpated. Bowel sounds positive.  Musculoskeletal: no clubbing / cyanosis. Normal muscle tone.  Skin: no rashes, lesions, ulcers. No induration Neurologic: CN 2-12 grossly intact. Strength 5/5 in all 4.  Psychiatric: Normal judgment and insight. Alert and oriented x 3. Normal mood.   Labs on Admission:  I have personally reviewed following labs and imaging studies  CBC: Recent Labs  Lab 03/13/22 1148  WBC 9.4  NEUTROABS 7.5  HGB 12.4*  HCT 36.6*  MCV 88.6  PLT 606   Basic Metabolic Panel: Recent Labs  Lab 03/13/22 1148  NA 134*  K 4.3  CL 96*  CO2 27  GLUCOSE 171*  BUN 38*  CREATININE 4.22*  CALCIUM 9.1  MG 1.5*   Liver Function Tests: Recent Labs  Lab 03/13/22 1148  AST 45*  ALT 38  ALKPHOS 107  BILITOT 0.7  PROT 6.0*  ALBUMIN 2.3*   Coagulation Profile: No results for input(s): "INR", "PROTIME" in the last 168 hours. BNP (last 3 results) No results for input(s): "PROBNP" in the last 8760 hours. CBG: No results for input(s): "GLUCAP" in the last 168 hours. Thyroid Function Tests: Recent Labs    03/13/22 1148  TSH 1.645   Urine analysis:    Component Value Date/Time   COLORURINE STRAW (A) 05/14/2015 1011   APPEARANCEUR CLEAR (A) 05/14/2015 1011   APPEARANCEUR Clear 03/25/2013 0850   LABSPEC 1.007 05/14/2015 1011   LABSPEC 1.023 03/25/2013 0850   PHURINE 6.0 05/14/2015 1011   GLUCOSEU >500 (A) 05/14/2015 1011   GLUCOSEU 50 mg/dL 03/25/2013 0850   HGBUR 1+ (A) 05/14/2015  1011   BILIRUBINUR NEGATIVE 05/14/2015 1011   BILIRUBINUR Negative 03/25/2013 [place] 05/14/2015 1011   PROTEINUR 100 (A) 05/14/2015 1011   NITRITE NEGATIVE 05/14/2015 [place] 05/14/2015 1011   LEUKOCYTESUR Negative 03/25/2013 0850     Radiological Exams on Admission: DG Chest 2 View  Result Date: 03/13/2022 CLINICAL DATA:  Shortness of breath.  Low blood pressure. EXAM: CHEST - 2 VIEW COMPARISON:  Chest CT 12/01/2021.  Radiographs 05/14/2015. FINDINGS: The heart size and mediastinal contours are stable status post median sternotomy and CABG. The lungs are clear. There is no pleural effusion or pneumothorax. No acute osseous findings are demonstrated. IMPRESSION: Stable chest. No acute cardiopulmonary process. Previous CABG. Electronically Signed   By: Richardean Sale M.D.   On: 03/13/2022 16:58    EKG: Independently reviewed.  Sinus rhythm, nonspecific ST segment changes  Assessment/Plan Principal problem Presyncope, progressive weakness, hypotension-this is relatively acute on chronic since January of this year, patient has been having progressive weakness, poor p.o. intake, weight loss over the last several months.  He tells me he is up-to-date with his cancer screening as an outpatient and has been evaluated for this.  Possibly in the setting of his underlying PD, which was started few months prior to January 2023 -Hold home antihypertensives, TSH was checked and it was normal.  Check a cortisol in the morning.  Repeat a 2D echo -obtain orthostatics  Active problems Coronary artery disease with history of CABG, troponin elevation-most recent cardiac evaluation was done about a year ago in October 2022, with a left heart cath which showed moderate CAD, there was discussion about stenting but I do not see a follow-up cardiac cath. -Currently has no chest pain, will cycle his troponin but is probably elevated in the setting of chronic  hypotension. -Repeat a 2D echo as above -Resume home aspirin, statin  Hyperlipidemia-resume home medications once pharmacy reconciled his meds  Type 2 diabetes mellitus-placed on long-acting at a lower dose, sliding scale  ESRD on PD-patient wants to resume his peritoneal dialysis while here, I messaged Dr. Candiss Norse, appreciate assistance  Essential hypertension, now hypotensive-hold home metoprolol, spironolactone, valsartan/HCTZ.  Placed on  midodrine  DVT prophylaxis: heparin  Code Status: Full code  Family Communication: wife at bedside  Disposition Plan: home when ready Bed Type: cardiacl telemetry  Consults called: nephrology   Obs/Inp: Obs  Marzetta Board, MD, PhD [facility]  Contact via [email or web address]  03/13/2022, 7:15 PM

## 2022-03-13 NOTE — ED Notes (Signed)
Repeat EKG done.

## 2022-03-13 NOTE — ED Triage Notes (Signed)
Pt presents to the ED via POV due to low BP. Pt states he does peritoneal dialysis at home. Pt states he may be taking too much and needs to adjust. Pt also c/o dizziness, which caused him to fall this morning. Pt denies LOC and NVD. Pt A&Ox4

## 2022-03-13 NOTE — ED Provider Notes (Signed)
[facility] Provider Note    Event Date/Time   First MD Initiated Contact with Patient 03/13/22 1545     (approximate)   History   Hypotension   HPI  Roger Knight is a 71 y.o. male  here with transient hypotension and weakness. Pt has a h/o ESRD on PD. He has been having issues with hypotension per report and has had progressive, significant decline over the last several months. He was recently started on midodrine for this. Reports that over the past several days he has felt more weak than he had previously, along with having lightheadedness with standing, fatigue, and today, near syncope when standing. Pt states that he has had poor appetite but has been trying to drink/eat as much as he can. He does continue to make urine as well. He also has been working with nephrologist to adjust dialysate based on his sx.        Physical Exam   Triage Vital Signs: ED Triage Vitals  Enc Vitals Group     BP 03/13/22 1136 95/67     Pulse Rate 03/13/22 1135 (!) 57     Resp 03/13/22 1135 15     Temp 03/13/22 1135 97.9 F (36.6 C)     Temp Source 03/13/22 1135 Oral     SpO2 03/13/22 1135 100 %     Weight 03/13/22 1136 162 lb (73.5 kg)     Height 03/13/22 1136 '5\' 7"'$  (1.702 m)     Head Circumference --      Peak Flow --      Pain Score 03/13/22 1139 0     Pain Loc --      Pain Edu? --      Excl. in [place]? --     Most recent vital signs: Vitals:   03/13/22 2149 03/14/22 0033  BP: (!) 78/54 122/72  Pulse: 64 60  Resp:  12  Temp:  98.3 F (36.8 C)  SpO2:  99%     General: Awake, no distress.  CV:  Good peripheral perfusion. Systolic murmur. Resp:  Normal effort. Lungs CTAB. Abd:  No distention. Abdomen soft. Other:  Dry MM   ED Results / Procedures / Treatments   Labs (all labs ordered are listed, but only abnormal results are displayed) Labs Reviewed  COMPREHENSIVE METABOLIC PANEL - Abnormal; Notable for the following components:       Result Value   Sodium 134 (*)    Chloride 96 (*)    Glucose, Bld 171 (*)    BUN 38 (*)    Creatinine, Ser 4.22 (*)    Total Protein 6.0 (*)    Albumin 2.3 (*)    AST 45 (*)    GFR, Estimated 14 (*)    All other components within normal limits  CBC WITH DIFFERENTIAL/PLATELET - Abnormal; Notable for the following components:   RBC 4.13 (*)    Hemoglobin 12.4 (*)    HCT 36.6 (*)    All other components within normal limits  MAGNESIUM - Abnormal; Notable for the following components:   Magnesium 1.5 (*)    All other components within normal limits  HEMOGLOBIN A1C - Abnormal; Notable for the following components:   Hgb A1c MFr Bld 6.6 (*)    All other components within normal limits  TROPONIN I (HIGH SENSITIVITY) - Abnormal; Notable for the following components:   Troponin I (High Sensitivity) 157 (*)    All other components within normal limits  TROPONIN I (HIGH SENSITIVITY) - Abnormal; Notable for the following components:   Troponin I (High Sensitivity) 94 (*)    All other components within normal limits  TSH  CORTISOL  TSH  COMPREHENSIVE METABOLIC PANEL  CBC  CBG MONITORING, ED      RADIOLOGY CXR: Stable, no acute process   I also independently reviewed and agree with radiologist interpretations.   PROCEDURES:  Critical Care performed: No   MEDICATIONS ORDERED IN ED: Medications  heparin injection 5,000 Units (5,000 Units Subcutaneous Given 03/14/22 0035)  ondansetron (ZOFRAN) tablet 4 mg (has no administration in time range)    Or  ondansetron (ZOFRAN) injection 4 mg (has no administration in time range)  midodrine (PROAMATINE) tablet 5 mg (5 mg Oral Given 03/13/22 1938)  insulin aspart (novoLOG) injection 0-9 Units (has no administration in time range)  insulin aspart (novoLOG) injection 0-5 Units (0 Units Subcutaneous Hold 03/13/22 2250)  insulin glargine-yfgn (SEMGLEE) injection 25 Units (0 Units Subcutaneous Hold 03/13/22 2250)  aspirin EC tablet 81 mg  (has no administration in time range)  atorvastatin (LIPITOR) tablet 80 mg (has no administration in time range)  ezetimibe (ZETIA) tablet 10 mg (has no administration in time range)  calcitRIOL (ROCALTROL) capsule 0.25 mcg (has no administration in time range)  cinacalcet (SENSIPAR) tablet 30 mg (has no administration in time range)  pantoprazole (PROTONIX) EC tablet 40 mg (has no administration in time range)  gabapentin (NEURONTIN) capsule 300 mg (300 mg Oral Given 03/14/22 0035)  multivitamin with minerals tablet 1 tablet (has no administration in time range)  omega-3 acid ethyl esters (LOVAZA) capsule 3 g (has no administration in time range)  insulin aspart (novoLOG) injection 4 Units (has no administration in time range)  sodium chloride 0.9 % bolus 500 mL (0 mLs Intravenous Stopped 03/13/22 1726)     IMPRESSION / MDM / ASSESSMENT AND PLAN / ED COURSE  I reviewed the triage vital signs and the nursing notes.                               This patient presents to the ED for concern of weakness, this involves an extensive number of treatment options, and is a complaint that carries with it a high risk of complications and morbidity.  The differential diagnosis includes hypotension 2/2 dehydration, anemia, occult sepsis, arrhythmia, CHF, polypharmacy   Co morbidities that complicate the patient evaluation  ESRD HTN HLD   Additional history obtained:  Additional history obtained from wife External records from outside source obtained and reviewed including nephrology notes   Lab Tests:  I Ordered, and personally interpreted labs.  The pertinent results include:   CBC without leukocytosis, Hgb stable at 12.4 TSH normal Lytes acceptable Trop elevated at 157 Mag 1.5    Imaging Studies ordered:  I ordered imaging studies including CXR  I independently visualized and interpreted imaging which showed: CXR normal I agree with the radiologist interpretation   Cardiac  Monitoring: / EKG:  The patient was maintained on a cardiac monitor.  I personally viewed and interpreted the cardiac monitored which showed an underlying rhythm of: NSR   Problem List / ED Course / Critical interventions / Medication management  Weakness/hypotension Likely multifactorial related to his ESRD/PD status, relative hypovolemia, query possible ischemic component as EKG does show TWI and troponin elevated, though pt denies any chest pain at this time. Will admit for obs and hydration.  I ordered  medication including IVF  for dehydration  Reevaluation of the patient after these medicines showed that the patient improved I have reviewed the patients home medicines and have made adjustments as needed  FINAL CLINICAL IMPRESSION(S) / ED DIAGNOSES   Final diagnoses:  Dehydration  Elevated troponin  ESRD (end stage renal disease) ([place])     Rx / DC Orders   ED Discharge Orders     None        Note:  This document was prepared using Dragon voice recognition software and may include unintentional dictation errors.   Duffy Bruce, MD 03/14/22 [id number]

## 2022-03-13 NOTE — ED Notes (Signed)
Pt given the OK to eat by DR. Pt given sandwich tray.

## 2022-03-13 NOTE — ED Provider Notes (Signed)
Triage team notified that patient should be placed to an acute treatment bed as soon as 1 becomes available.  His EKG does not show obvious STEMI but it does show pronounced T wave abnormality in query if the patient may be experiencing some subtle cardiac ischemia, again not consistent with STEMI but given the patient's associated hypotension may be an element of demand ischemia or other etiology which needs to be identified.  Triage team aware   Delman Kitten, MD 03/13/22 1153

## 2022-03-14 ENCOUNTER — Encounter: Payer: Self-pay | Admitting: Internal Medicine

## 2022-03-14 ENCOUNTER — Observation Stay
Admit: 2022-03-14 | Discharge: 2022-03-14 | Disposition: A | Discharge status: Home / Self Care | Payer: Medicare Other | Attending: Internal Medicine | Admitting: Internal Medicine

## 2022-03-14 ENCOUNTER — Other Ambulatory Visit: Payer: Self-pay

## 2022-03-14 DIAGNOSIS — D638 Anemia in other chronic diseases classified elsewhere: Secondary | ICD-10-CM | POA: Diagnosis not present

## 2022-03-14 DIAGNOSIS — E1142 Type 2 diabetes mellitus with diabetic polyneuropathy: Secondary | ICD-10-CM | POA: Diagnosis not present

## 2022-03-14 DIAGNOSIS — R55 Syncope and collapse: Secondary | ICD-10-CM | POA: Diagnosis not present

## 2022-03-14 DIAGNOSIS — E785 Hyperlipidemia, unspecified: Secondary | ICD-10-CM | POA: Diagnosis not present

## 2022-03-14 DIAGNOSIS — D631 Anemia in chronic kidney disease: Secondary | ICD-10-CM | POA: Diagnosis not present

## 2022-03-14 DIAGNOSIS — N186 End stage renal disease: Secondary | ICD-10-CM | POA: Diagnosis not present

## 2022-03-14 DIAGNOSIS — N2581 Secondary hyperparathyroidism of renal origin: Secondary | ICD-10-CM | POA: Diagnosis not present

## 2022-03-14 DIAGNOSIS — E871 Hypo-osmolality and hyponatremia: Secondary | ICD-10-CM

## 2022-03-14 DIAGNOSIS — I951 Orthostatic hypotension: Secondary | ICD-10-CM

## 2022-03-14 DIAGNOSIS — I200 Unstable angina: Secondary | ICD-10-CM | POA: Diagnosis not present

## 2022-03-14 DIAGNOSIS — I959 Hypotension, unspecified: Secondary | ICD-10-CM | POA: Diagnosis not present

## 2022-03-14 DIAGNOSIS — E1169 Type 2 diabetes mellitus with other specified complication: Secondary | ICD-10-CM | POA: Diagnosis not present

## 2022-03-14 DIAGNOSIS — E1140 Type 2 diabetes mellitus with diabetic neuropathy, unspecified: Secondary | ICD-10-CM | POA: Insufficient documentation

## 2022-03-14 LAB — COMPREHENSIVE METABOLIC PANEL
ALT: 27 U/L (ref 0–44)
AST: 32 U/L (ref 15–41)
Albumin: 2 g/dL — ABNORMAL LOW (ref 3.5–5.0)
Alkaline Phosphatase: 83 U/L (ref 38–126)
Anion gap: 8 (ref 5–15)
BUN: 41 mg/dL — ABNORMAL HIGH (ref 8–23)
CO2: 26 mmol/L (ref 22–32)
Calcium: 8.7 mg/dL — ABNORMAL LOW (ref 8.9–10.3)
Chloride: 98 mmol/L (ref 98–111)
Creatinine, Ser: 4.24 mg/dL — ABNORMAL HIGH (ref 0.61–1.24)
GFR, Estimated: 14 mL/min — ABNORMAL LOW (ref >60)
Glucose, Bld: 98 mg/dL (ref 70–99)
Potassium: 3.7 mmol/L (ref 3.5–5.1)
Sodium: 132 mmol/L — ABNORMAL LOW (ref 135–145)
Total Bilirubin: 0.8 mg/dL (ref 0.3–1.2)
Total Protein: 5 g/dL — ABNORMAL LOW (ref 6.5–8.1)

## 2022-03-14 LAB — CBC
HCT: 29.5 % — ABNORMAL LOW (ref 39.0–52.0)
Hemoglobin: 10.1 g/dL — ABNORMAL LOW (ref 13.0–17.0)
MCH: 30.6 pg (ref 26.0–34.0)
MCHC: 34.2 g/dL (ref 30.0–36.0)
MCV: 89.4 fL (ref 80.0–100.0)
Platelets: 196 10*3/uL (ref 150–400)
RBC: 3.3 MIL/uL — ABNORMAL LOW (ref 4.22–5.81)
RDW: 13.5 % (ref 11.5–15.5)
WBC: 7.5 10*3/uL (ref 4.0–10.5)
nRBC: 0 % (ref 0.0–0.2)

## 2022-03-14 LAB — ECHOCARDIOGRAM COMPLETE
AR max vel: 0.88 cm2
AV Area VTI: 0.84 cm2
AV Area mean vel: 0.81 cm2
AV Mean grad: 27.5 mmHg
AV Peak grad: 47.9 mmHg
Ao pk vel: 3.46 m/s
Area-P 1/2: 3.54 cm2
Calc EF: 38.5 %
Height: 67 in
S' Lateral: 6 cm
Single Plane A2C EF: 40.8 %
Single Plane A4C EF: 35 %
Weight: 2592 oz

## 2022-03-14 LAB — TSH: TSH: 1.545 u[IU]/mL (ref 0.350–4.500)

## 2022-03-14 LAB — HEPATITIS B SURFACE ANTIGEN: Hepatitis B Surface Ag: NONREACTIVE

## 2022-03-14 LAB — GLUCOSE, CAPILLARY: Glucose-Capillary: 129 mg/dL — ABNORMAL HIGH (ref 70–99)

## 2022-03-14 LAB — CBG MONITORING, ED
Glucose-Capillary: 99 mg/dL (ref 70–99)
Glucose-Capillary: 147 mg/dL — ABNORMAL HIGH (ref 70–99)
Glucose-Capillary: 89 mg/dL (ref 70–99)

## 2022-03-14 LAB — HEMOGLOBIN A1C
Hgb A1c MFr Bld: 6.6 % — ABNORMAL HIGH (ref 4.8–5.6)
Mean Plasma Glucose: 142.72 mg/dL

## 2022-03-14 LAB — CORTISOL: Cortisol, Plasma: 6.4 ug/dL

## 2022-03-14 MED ORDER — ATORVASTATIN CALCIUM 20 MG PO TABS
80.0000 mg | ORAL_TABLET | Freq: Every day | ORAL | Status: DC
  Administered 2022-03-14 – 2022-03-15 (×2): 80 mg via ORAL
  Filled 2022-03-14 (×2): qty 4

## 2022-03-14 MED ORDER — INSULIN ASPART 100 UNIT/ML IJ SOLN
4.0000 [IU] | Freq: Three times a day (TID) | INTRAMUSCULAR | Status: DC
  Filled 2022-03-14: qty 1

## 2022-03-14 MED ORDER — ONDANSETRON HCL 4 MG PO TABS: 4.0000 mg | ORAL_TABLET | Freq: Four times a day (QID) | ORAL | Status: DC | PRN

## 2022-03-14 MED ORDER — GABAPENTIN 300 MG PO CAPS
300.0000 mg | ORAL_CAPSULE | Freq: Every day | ORAL | Status: DC
  Administered 2022-03-14 (×2): 300 mg via ORAL
  Filled 2022-03-14 (×2): qty 1

## 2022-03-14 MED ORDER — SODIUM CHLORIDE 0.9 % IV BOLUS
500.0000 mL | Freq: Once | INTRAVENOUS | Status: AC
  Administered 2022-03-14: 500 mL via INTRAVENOUS

## 2022-03-14 MED ORDER — MIDODRINE HCL 5 MG PO TABS
10.0000 mg | ORAL_TABLET | Freq: Three times a day (TID) | ORAL | Status: DC
  Administered 2022-03-14 – 2022-03-15 (×3): 10 mg via ORAL
  Filled 2022-03-14 (×3): qty 2

## 2022-03-14 MED ORDER — GENTAMICIN SULFATE 0.1 % EX CREA
1.0000 | TOPICAL_CREAM | Freq: Every day | CUTANEOUS | Status: DC
  Filled 2022-03-14: qty 15

## 2022-03-14 MED ORDER — HEPARIN SODIUM (PORCINE) 5000 UNIT/ML IJ SOLN
5000.0000 [IU] | Freq: Three times a day (TID) | INTRAMUSCULAR | Status: DC
  Administered 2022-03-14 – 2022-03-15 (×5): 5000 [IU] via SUBCUTANEOUS
  Filled 2022-03-14 (×5): qty 1

## 2022-03-14 MED ORDER — ASPIRIN 81 MG PO TBEC
81.0000 mg | DELAYED_RELEASE_TABLET | Freq: Every day | ORAL | Status: DC
  Administered 2022-03-14 – 2022-03-15 (×2): 81 mg via ORAL
  Filled 2022-03-14 (×2): qty 1

## 2022-03-14 MED ORDER — CINACALCET HCL 30 MG PO TABS
30.0000 mg | ORAL_TABLET | Freq: Every day | ORAL | Status: DC
  Filled 2022-03-14 (×2): qty 1

## 2022-03-14 MED ORDER — SODIUM CHLORIDE 0.9 % IV SOLN: INTRAVENOUS | Status: DC

## 2022-03-14 MED ORDER — DELFLEX-LC/1.5% DEXTROSE 344 MOSM/L IP SOLN: INTRAPERITONEAL | Status: DC

## 2022-03-14 MED ORDER — ADULT MULTIVITAMIN W/MINERALS CH
1.0000 | ORAL_TABLET | Freq: Every day | ORAL | Status: DC
  Administered 2022-03-14 – 2022-03-15 (×2): 1 via ORAL
  Filled 2022-03-14 (×2): qty 1

## 2022-03-14 MED ORDER — CALCITRIOL 0.25 MCG PO CAPS
0.2500 ug | ORAL_CAPSULE | Freq: Every day | ORAL | Status: DC
  Administered 2022-03-14 – 2022-03-15 (×2): 0.25 ug via ORAL
  Filled 2022-03-14 (×2): qty 1

## 2022-03-14 MED ORDER — OMEGA-3-ACID ETHYL ESTERS 1 G PO CAPS
3.0000 g | ORAL_CAPSULE | Freq: Every day | ORAL | Status: DC
  Administered 2022-03-14 – 2022-03-15 (×2): 3 g via ORAL
  Filled 2022-03-14 (×2): qty 3

## 2022-03-14 MED ORDER — EZETIMIBE 10 MG PO TABS
10.0000 mg | ORAL_TABLET | Freq: Every day | ORAL | Status: DC
  Administered 2022-03-14 – 2022-03-15 (×2): 10 mg via ORAL
  Filled 2022-03-14 (×2): qty 1

## 2022-03-14 MED ORDER — PANTOPRAZOLE SODIUM 40 MG PO TBEC
40.0000 mg | DELAYED_RELEASE_TABLET | Freq: Two times a day (BID) | ORAL | Status: DC
  Administered 2022-03-14 – 2022-03-15 (×3): 40 mg via ORAL
  Filled 2022-03-14 (×3): qty 1

## 2022-03-14 MED ORDER — ONDANSETRON HCL 4 MG/2ML IJ SOLN: 4.0000 mg | Freq: Four times a day (QID) | INTRAMUSCULAR | Status: DC | PRN

## 2022-03-14 NOTE — Assessment & Plan Note (Signed)
Continue PD dialysis.  Patient states that he spoke with nephrology team about changing up the dialysis solution.

## 2022-03-14 NOTE — Progress Notes (Signed)
  Progress Note   Patient: Roger Knight VZC:[id number] DOB: [birth year] DOA: 03/13/2022     0 DOS: the patient was seen and examined on 03/14/2022   Brief hospital course: 71 year old male with past medical history of end-stage renal disease on peritoneal dialysis, CAD, type 2 diabetes mellitus, hyperlipidemia presents to the hospital with presyncope and weakness.  Patient has been more hypotensive as outpatient and recently started on midodrine.  Patient was orthostatic on 03/14/2022.  Midodrine was increased to 10 mg 3 times a day and a fluid bolus was ordered.  Assessment and Plan: * Orthostatic hypotension We will give IV fluid bolus today and increase midodrine to 10 mg 3 times a day.  Hyponatremia Sodium only a few points lower than normal range.  Dialysis to manage.  ESRD (end stage renal disease) ([place]) Continue PD dialysis.  Patient states that he spoke with nephrology team about changing up the dialysis solution.  Diabetic neuropathy (HCC) Continue gabapentin  Near syncope Secondary to orthostatic hypotension.  Type 2 diabetes mellitus with hyperlipidemia (HCC) Continue Semglee 25 units at night and sliding scale insulin.  Continue Lipitor and Zetia.  Anemia of chronic disease Last hemoglobin 10.1.        Subjective: Patient feels okay.  Did have orthostasis with working with physical therapy.  Receiving fluid bolus and increasing dose of midodrine for orthostatic hypotension.  Physical Exam: Vitals:   03/14/22 0548 03/14/22 0821 03/14/22 1000 03/14/22 1154  BP: 117/65 (!) 116/91 105/74 (!) 100/57  Pulse: 60 62 67 63  Resp: '13 19 18 18  '$ Temp: 98.1 F (36.7 C) 97.8 F (36.6 C) 97.6 F (36.4 C) 99.1 F (37.3 C)  TempSrc: Oral     SpO2: 100%  99%   Weight:      Height:       Physical Exam HENT:     Head: Normocephalic.     Mouth/Throat:     Pharynx: No oropharyngeal exudate.  Eyes:     General: Lids are normal.     Conjunctiva/sclera:  Conjunctivae normal.  Cardiovascular:     Rate and Rhythm: Normal rate and regular rhythm.     Heart sounds: Normal heart sounds, S1 normal and S2 normal.  Pulmonary:     Breath sounds: No decreased breath sounds, wheezing, rhonchi or rales.  Abdominal:     Palpations: Abdomen is soft.     Tenderness: There is no abdominal tenderness.  Musculoskeletal:     Right lower leg: No swelling.     Left lower leg: No swelling.  Skin:    General: Skin is warm.     Findings: No rash.  Neurological:     Mental Status: He is alert and oriented to person, place, and time.     Data Reviewed: Hemoglobin 10.1, sodium 132, creatinine 4.24  Family Communication: Spoke with family at the bedside  Disposition: Status is: Observation Patient orthostatic today.  We will give a fluid bolus and increase midodrine to 10 mg 3 times a day.  Planned Discharge Destination: Home    Time spent: 28 minutes  Author: Loletha Grayer, MD 03/14/2022 1:54 PM  For on call review [email or web address].

## 2022-03-14 NOTE — ED Notes (Signed)
PT will be admitted in a shared room, shared TV, bathroom and AC.

## 2022-03-14 NOTE — Assessment & Plan Note (Signed)
Sodium only a few points lower than normal range.  Dialysis to manage.

## 2022-03-14 NOTE — Assessment & Plan Note (Signed)
Last hemoglobin 10.1 

## 2022-03-14 NOTE — Progress Notes (Signed)
Patient is not able to walk the distance required to go the bathroom, or he/she is unable to safely negotiate stairs required to access the bathroom.  A 3in1 BSC will alleviate this problem  

## 2022-03-14 NOTE — Assessment & Plan Note (Signed)
Continue gabapentin.

## 2022-03-14 NOTE — Evaluation (Signed)
Physical Therapy Evaluation Patient Details Name: Roger Knight MRN: [id number] DOB: 08/06/1950 Today's Date: 03/14/2022  History of Present Illness  Roger Knight is a 71 y.o. male with medical history significant of ESRD on peritoneal dialysis for the past 1.5 years, CAD status post CABG, DM 2, HTN, HLD, who comes to the hospital with complaints of progressive weakness, weight loss, acute on chronic over the last several months but especially more so this morning when he was extremely weak, his legs gave out and he almost passed out.  He denies any chest pain, denies any shortness of breath, no palpitations.  Felt like his legs became very weak, denies any focal weakness but it was more generalized.  He reports poor p.o. intake over the last several months, resulting in a 60 pound weight loss since January 2023.  He was also found to be more hypotensive as an outpatient and was recently started on midodrine.  Due to presyncopal episode this morning he decided to come to the hospital.  Clinical Impression  Pt received in bed with spouse by his side agreeable to PT evaluation. For PLOF, pt was Ind at household level and community level activity participation and driving to and from the dialysis center until 2 weeks ago. However, pt verbalized poor confidence due to unsteady  on feet. Pt agreed to use AD to prevent falls. Today's assessment reveals, poor balance evident by pt ambulated with WBOS, unable to stand on SL, tandem, NBOS with eyes closed and became unsteady with NBOS and E/O.  Pt ambulated with unsteady gait pushing Vital pole which improved pt's confidence as per pt. Pt BP Supine with HOB elevated 108/66, seated 104/89, and Standing 88/60 and asymptomatic. Pt is MMT revealed proximal weakness. PT educated pt regarding fall risk and the significance of using FWW to improve safety with al function. Pt and Wife demonstrated good understanding.   Pt will benefit form continued PT in acute  and HHPT, Shower seat and FWW after acute care.      Recommendations for follow up therapy are one component of a multi-disciplinary discharge planning process, led by the attending physician.  Recommendations may be updated based on patient status, additional functional criteria and insurance authorization.  Follow Up Recommendations Home health PT      Assistance Recommended at Discharge Intermittent Supervision/Assistance  Patient can return home with the following  A little help with walking and/or transfers;A little help with bathing/dressing/bathroom;Assistance with cooking/housework;Assist for transportation;Help with stairs or ramp for entrance    Equipment Recommendations Rolling walker (2 wheels) (Shower seat)  Recommendations for Other Services       Functional Status Assessment Patient has had a recent decline in their functional status and demonstrates the ability to make significant improvements in function in a reasonable and predictable amount of time.     Precautions / Restrictions Precautions Precautions: Fall Restrictions Weight Bearing Restrictions: No      Mobility  Bed Mobility Overal bed mobility: Independent                  Transfers Overall transfer level: Needs assistance Equipment used:  (Pushed Vitals Pole) Transfers: Sit to/from Stand Sit to Stand: Supervision                Ambulation/Gait Ambulation/Gait assistance: Min guard Gait Distance (Feet): 100 Feet Assistive device:  (Vital pole) Gait Pattern/deviations: Step-through pattern, Decreased stride length, Wide base of support (unsteady gait) Gait velocity: dec     General  Gait Details: Unsteady and slwo gait due to weakness and low BP  Stairs            Wheelchair Mobility    Modified Rankin (Stroke Patients Only)       Balance Overall balance assessment: Needs assistance Sitting-balance support: Feet supported Sitting balance-Leahy Scale: Good      Standing balance support: No upper extremity supported Standing balance-Leahy Scale: Poor Standing balance comment: sways with NBOS Single Leg Stance - Right Leg: 0 Single Leg Stance - Left Leg: 0 Tandem Stance - Right Leg: 0 Tandem Stance - Left Leg: 0 [place] - Eyes Opened: 10 [place] - Eyes Closed: 0                 Pertinent Vitals/Pain Pain Assessment Pain Assessment: No/denies pain    Home Living Family/patient expects to be discharged to:: Private residence Living Arrangements: Spouse/significant other Available Help at Discharge: Family;Available 24 hours/day Type of Home: House Home Access: Stairs to enter       Home Layout: One level Home Equipment: Cane - single point      Prior Function Prior Level of Function : Independent/Modified Independent             Mobility Comments: Pt ambulated without AD at household level and community level activity participation. pt drove to dialysis cneter until 2 weeks ago. Pt has become very weak over past 2 weeks preventing him to drive and go outdoors. ADLs Comments: Ind with adls and IADLs     Hand Dominance   Dominant Hand: Right    Extremity/Trunk Assessment   Upper Extremity Assessment Upper Extremity Assessment: Generalized weakness    Lower Extremity Assessment Lower Extremity Assessment: Generalized weakness (Proximal weakness > distal.)       Communication   Communication: No difficulties  Cognition Arousal/Alertness: Awake/alert Behavior During Therapy: WFL for tasks assessed/performed Overall Cognitive Status: Within Functional Limits for tasks assessed                                 General Comments: Pt verbalized poor confiedence due to weakness and unsteady gait.        General Comments      Exercises     Assessment/Plan    PT Assessment Patient needs continued PT services  PT Problem List Decreased strength;Decreased activity tolerance;Decreased  balance;Cardiopulmonary status limiting activity       PT Treatment Interventions Gait training;Stair training;Functional mobility training;Therapeutic activities;Therapeutic exercise;Balance training;Neuromuscular re-education;Patient/family education    PT Goals (Current goals can be found in the Care Plan section)  Acute Rehab PT Goals Patient Stated Goal: " I want to get better and become confident with activities so that I don't fall." PT Goal Formulation: With patient Time For Goal Achievement: 03/28/22 Potential to Achieve Goals: Good Additional Goals Additional Goal #1: Pt will perform tandem standing >10 secs to demonstrate reduced fall risk.    Frequency Min 2X/week     Co-evaluation               AM-PAC PT "6 Clicks" Mobility  Outcome Measure Help needed turning from your back to your side while in a flat bed without using bedrails?: None Help needed moving from lying on your back to sitting on the side of a flat bed without using bedrails?: None Help needed moving to and from a bed to a chair (including a wheelchair)?: A Little Help needed standing up  from a chair using your arms (e.g., wheelchair or bedside chair)?: A Little Help needed to walk in hospital room?: A Little Help needed climbing 3-5 steps with a railing? : A Lot 6 Click Score: 19    End of Session Equipment Utilized During Treatment: Gait belt Activity Tolerance: Treatment limited secondary to medical complications (Comment) Patient left: in bed;with bed alarm set;with family/visitor present;with call bell/phone within reach Nurse Communication: Mobility status PT Visit Diagnosis: Unsteadiness on feet (R26.81);History of falling (Z91.81);Muscle weakness (generalized) (M62.81)    Time: 1000-1031 PT Time Calculation (min) (ACUTE ONLY): 31 min   Charges:   PT Evaluation $PT Eval Moderate Complexity: 1 Mod PT Treatments $Gait Training: 8-22 mins      Creta Dorame PT DPT 11:03  AM,03/14/22

## 2022-03-14 NOTE — TOC Initial Note (Signed)
Transition of Care [facility]) - Initial/Assessment Note    Patient Details  Name: Roger Knight MRN: [id number] Date of Birth: [birth year]  Transition of Care [facility]) CM/SW Contact:    Shelbie Hutching, RN Phone Number: 03/14/2022, 3:15 PM  Clinical Narrative:                 Patient placed under observation for orthostatic hypotension.  Patient is end stage renal disease on peritoneal dialysis.  RNCM met with patient at the beside, daughter is also present.  Patient is from home with is wife, he is mostly independent and walks with a cane, he also drives.  Patient does his own peritoneal dialysis every night.  PT worked with patient and is recommending [facility].  Patient is in agreement, he does not have a preference.  Corene Cornea with [facility] accepted referral for [facility] and OT.  RW and shower stool ordered from [employer].   Patient's wife will be able to pick him up at discharge.   Expected Discharge Plan: [place] Barriers to Discharge: Continued Medical Work up   Patient Goals and CMS Choice Patient states their goals for this hospitalization and ongoing recovery are:: to get home CMS Medicare.gov Compare Post Acute Care list provided to:: Patient Choice offered to / list presented to : Patient  Expected Discharge Plan and Services Expected Discharge Plan: [place]   Discharge Planning Services: CM Consult Post Acute Care Choice: [place] arrangements for the past 2 months: Single Family Home                 DME Arranged: [place] DME Agency: [employer] Date DME Agency Contacted: 03/14/22 Time DME Agency Contacted: [phone] Representative spoke with at DME Agency: [place]: PT, [place] Agency: [place] ([place]) Date [place]: 03/14/22 Time [place]: 72 Representative spoke with at [place]: Corene Cornea  Prior Living Arrangements/Services Living arrangements for the past 2 months:  [place] Lives with:: Spouse Patient language and need for interpreter reviewed:: Yes Do you feel safe going back to the place where you live?: Yes      Need for Family Participation in Patient Care: Yes (Comment) Care giver support system in place?: Yes (comment) Current home services: DME (cane) Criminal Activity/Legal Involvement Pertinent to Current Situation/Hospitalization: No - Comment as needed  Activities of Daily Living Home Assistive Devices/Equipment: Other (Comment) (PD supplies) ADL Screening (condition at time of admission) Patient's cognitive ability adequate to safely complete daily activities?: Yes Is the patient deaf or have difficulty hearing?: No Does the patient have difficulty seeing, even when wearing glasses/contacts?: No Does the patient have difficulty concentrating, remembering, or making decisions?: No Patient able to express need for assistance with ADLs?: Yes Does the patient have difficulty dressing or bathing?: No Independently performs ADLs?: Yes (appropriate for developmental age) Does the patient have difficulty walking or climbing stairs?: No Weakness of Legs: None Weakness of Arms/Hands: None  Permission Sought/Granted Permission sought to share information with : Case Manager, Family Supports, Other (comment) Permission granted to share information with : Yes, Verbal Permission Granted  Share Information with NAME: Davante Gerke  Permission granted to share info w AGENCY: home health agency  Permission granted to share info w Relationship: spouse  Permission granted to share info w Contact Information: [phone]  Emotional Assessment Appearance:: Appears stated age Attitude/Demeanor/Rapport: Engaged Affect (typically observed): Accepting Orientation: : Oriented to Self,  Oriented to Place, Oriented to  Time, Oriented to Situation Alcohol / Substance Use: Not Applicable Psych Involvement: No (comment)  Admission diagnosis:   Syncope [R55] Patient Active Problem List   Diagnosis Date Noted   Orthostatic hypotension 03/14/2022   Near syncope 03/14/2022   Diabetic neuropathy ([place]) 03/14/2022   ESRD (end stage renal disease) ([place]) 03/14/2022   Hyponatremia 03/14/2022   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/19/2020   Pre-operative clearance 06/02/2020   Peritoneal dialysis catheter, fitting and adjustment ([place]) 06/02/2020   Hyperparathyroidism, secondary renal ([place]) 10/21/2018   Iron deficiency anemia 06/16/2016   Acute lower GI bleeding 06/01/2015   External hemorrhoid 05/18/2015   Internal hemorrhoids with complication 03/49/1791   GIB (gastrointestinal bleeding) 05/11/2015   Type II diabetes mellitus with neurological manifestations ([place]) 11/16/2014   Stage III chronic kidney disease ([place]) 10/27/2014   Coronary artery disease 10/27/2014   GERD (gastroesophageal reflux disease) 10/27/2014   Type 2 diabetes mellitus with hyperlipidemia ([place]) 10/27/2014   Dyslipidemia 10/27/2014   PN (peripheral neuropathy) ([place]) 10/27/2014   Anemia of chronic disease 10/13/2014   Monoclonal gammopathy 10/13/2014   Long-term insulin use ([place]) 05/05/2014   Peripheral polyneuropathy ([place]) 05/05/2014   Proteinuria 05/05/2014   Benign essential hypertension 02/11/2014   Intermediate coronary syndrome ([place]) 02/11/2014   Arthritis pain of hand 12/17/2013   Lumbar radiculitis 12/17/2013   DDD (degenerative disc disease), lumbar 12/17/2013   Rectal bleeding 04/17/2013   Acute blood loss anemia 04/17/2013   PCP:  Leonel Ramsay, MD Pharmacy:   [facility] - [place], [place] - [address] [place] [address] Phone: [phone] Fax: [phone]     Social Determinants of Health (SDOH) Interventions    Readmission Risk Interventions     No data to display

## 2022-03-14 NOTE — Hospital Course (Signed)
71 year old male with past medical history of end-stage renal disease on peritoneal dialysis, CAD, type 2 diabetes mellitus, hyperlipidemia presents to the hospital with presyncope and weakness.  Patient has been more hypotensive as outpatient and recently started on midodrine.  Patient was orthostatic on 03/14/2022.  Midodrine was increased to 10 mg 3 times a day and a fluid bolus was ordered.

## 2022-03-14 NOTE — Progress Notes (Signed)
*  PRELIMINARY RESULTS* Echocardiogram 2D Echocardiogram has been performed.  Roger Knight 03/14/2022, 9:27 AM

## 2022-03-14 NOTE — Assessment & Plan Note (Signed)
Secondary to orthostatic hypotension.

## 2022-03-14 NOTE — Assessment & Plan Note (Signed)
We will give IV fluid bolus today and increase midodrine to 10 mg 3 times a day.

## 2022-03-14 NOTE — Assessment & Plan Note (Addendum)
Continue Semglee 25 units at night and sliding scale insulin.  Continue Lipitor and Zetia.

## 2022-03-14 NOTE — Progress Notes (Signed)
[facility]  ROUNDING NOTE   Subjective:   Roger Knight is a 71 year old male with past medical conditions including diabetes, hyperlipidemia, hypertension, CAD status post CABG, end-stage renal disease on peritoneal dialysis.  Patient presents to the emergency department with complaints of weakness, weight loss, and low blood pressure.  Patient has been admitted under observation for Syncope [R55]  Patient is known to our practice and is followed outpatient with Dr. Holley Raring at the [facility] peritoneal dialysis clinic.  Patient states he has not missed any recent dialysis treatments.  Patient is seen sitting up in bed, wife at bedside.  He also states he has used the appropriate dialysate based on his needs.  Due to prior hypotension, patient states he had discontinued all antihypertensives previously.  Denies recent nausea or vomiting.  Denies known fever or chills.  Labs on ED arrival significant for sodium 134, glucose 171, BUN 38, creatinine 4.22 with GFR 14, troponin 157, hemoglobin 12.4.  Chest x-ray shows no acute changes.  We have been consulted to continue dialysis during this admission.  Objective:  Vital signs in last 24 hours:  Temp:  [97.6 F (36.4 C)-99.1 F (37.3 C)] 98.7 F (37.1 C) (11/21 1400) Pulse Rate:  [51-67] 57 (11/21 1412) Resp:  [12-20] 18 (11/21 1412) BP: (78-134)/(54-91) 124/74 (11/21 1411) SpO2:  [95 %-100 %] 99 % (11/21 1412)  Weight change:  Filed Weights   03/13/22 1136 03/13/22 1139  Weight: 73.5 kg 73.5 kg    Intake/Output: No intake/output data recorded.   Intake/Output this shift:  No intake/output data recorded.  Physical Exam: General: NAD  Head: Normocephalic, atraumatic. Moist oral mucosal membranes  Eyes: Anicteric  Lungs:  Clear to auscultation, normal effort, room air  Heart: Regular rate and rhythm  Abdomen:  Soft, nontender, PDC  Extremities: No peripheral edema.  Neurologic: Nonfocal, moving all four  extremities  Skin: No lesions  Access: PD catheter    Basic Metabolic Panel: Recent Labs  Lab 03/13/22 1148 03/14/22 0549  NA 134* 132*  K 4.3 3.7  CL 96* 98  CO2 27 26  GLUCOSE 171* 98  BUN 38* 41*  CREATININE 4.22* 4.24*  CALCIUM 9.1 8.7*  MG 1.5*  --     Liver Function Tests: Recent Labs  Lab 03/13/22 1148 03/14/22 0549  AST 45* 32  ALT 38 27  ALKPHOS 107 83  BILITOT 0.7 0.8  PROT 6.0* 5.0*  ALBUMIN 2.3* 2.0*   No results for input(s): "LIPASE", "AMYLASE" in the last 168 hours. No results for input(s): "AMMONIA" in the last 168 hours.  CBC: Recent Labs  Lab 03/13/22 1148 03/14/22 0549  WBC 9.4 7.5  NEUTROABS 7.5  --   HGB 12.4* 10.1*  HCT 36.6* 29.5*  MCV 88.6 89.4  PLT 267 196    Cardiac Enzymes: No results for input(s): "CKTOTAL", "CKMB", "CKMBINDEX", "TROPONINI" in the last 168 hours.  BNP: Invalid input(s): "POCBNP"  CBG: Recent Labs  Lab 03/13/22 2139 03/14/22 0718 03/14/22 1147  GLUCAP 86 99 147*    Microbiology: Results for orders placed or performed during the hospital encounter of 05/19/21  Resp Panel by RT-PCR (Flu A&B, Covid) Nasopharyngeal Swab     Status: None   Collection Time: 05/19/21  5:21 AM   Specimen: Nasopharyngeal Swab; Nasopharyngeal(NP) swabs in vial transport medium  Result Value Ref Range Status   SARS Coronavirus 2 by RT PCR NEGATIVE NEGATIVE Final    Comment: (NOTE) SARS-CoV-2 target nucleic acids are NOT  DETECTED.  The SARS-CoV-2 RNA is generally detectable in upper respiratory specimens during the acute phase of infection. The lowest concentration of SARS-CoV-2 viral copies this assay can detect is 138 copies/mL. A negative result does not preclude SARS-Cov-2 infection and should not be used as the sole basis for treatment or other patient management decisions. A negative result may occur with  improper specimen collection/handling, submission of specimen other than nasopharyngeal swab, presence of viral  mutation(s) within the areas targeted by this assay, and inadequate number of viral copies(<138 copies/mL). A negative result must be combined with clinical observations, patient history, and epidemiological information. The expected result is Negative.  Fact Sheet for Patients:  EntrepreneurPulse.com.au  Fact Sheet for Healthcare Providers:  IncredibleEmployment.be  This test is no t yet approved or cleared by the [place] FDA and  has been authorized for detection and/or diagnosis of SARS-CoV-2 by FDA under an Emergency Use Authorization (EUA). This EUA will remain  in effect (meaning this test can be used) for the duration of the COVID-19 declaration under Section 564(b)(1) of the Act, 21 U.S.C.section 360bbb-3(b)(1), unless the authorization is terminated  or revoked sooner.       Influenza A by PCR NEGATIVE NEGATIVE Final   Influenza B by PCR NEGATIVE NEGATIVE Final    Comment: (NOTE) The Xpert Xpress SARS-CoV-2/FLU/RSV plus assay is intended as an aid in the diagnosis of influenza from Nasopharyngeal swab specimens and should not be used as a sole basis for treatment. Nasal washings and aspirates are unacceptable for Xpert Xpress SARS-CoV-2/FLU/RSV testing.  Fact Sheet for Patients: EntrepreneurPulse.com.au  Fact Sheet for Healthcare Providers: IncredibleEmployment.be  This test is not yet approved or cleared by the [place] FDA and has been authorized for detection and/or diagnosis of SARS-CoV-2 by FDA under an Emergency Use Authorization (EUA). This EUA will remain in effect (meaning this test can be used) for the duration of the COVID-19 declaration under Section 564(b)(1) of the Act, 21 U.S.C. section 360bbb-3(b)(1), unless the authorization is terminated or revoked.  Performed at [facility], [address] [place], [place] [address]     Coagulation Studies: No  results for input(s): "LABPROT", "INR" in the last 72 hours.  Urinalysis: No results for input(s): "COLORURINE", "LABSPEC", "PHURINE", "GLUCOSEU", "HGBUR", "BILIRUBINUR", "KETONESUR", "PROTEINUR", "UROBILINOGEN", "NITRITE", "LEUKOCYTESUR" in the last 72 hours.  Invalid input(s): "APPERANCEUR"    Imaging: ECHOCARDIOGRAM COMPLETE  Result Date: 03/14/2022    ECHOCARDIOGRAM REPORT   Patient Name:   Roger Knight Date of Exam: 03/14/2022 Medical Rec #:  [id number]           Height:       67.0 in Accession #:    [id number]          Weight:       162.0 lb Date of Birth:  1950/05/02           BSA:          1.849 m Patient Age:    57 years            BP:           117/65 mmHg Patient Gender: M                   HR:           59 bpm. Exam Location:  [facility] Procedure: 2D Echo, Color Doppler and Cardiac Doppler Indications:     R55 Syncope  History:  Patient has no prior history of Echocardiogram examinations.                  CKD; Risk Factors:Hypertension, Diabetes and Dyslipidemia.  Sonographer:     Charmayne Sheer Referring Phys:  [place] Diagnosing Phys: Serafina Royals MD  Sonographer Comments: Suboptimal subcostal window. IMPRESSIONS  1. Left ventricular ejection fraction, by estimation, is 25 to 30%. The left ventricle has severely decreased function. The left ventricle demonstrates global hypokinesis. The left ventricular internal cavity size was moderately dilated. There is moderate left ventricular hypertrophy. Left ventricular diastolic parameters are consistent with Grade II diastolic dysfunction (pseudonormalization).  2. Right ventricular systolic function is normal. The right ventricular size is normal.  3. Left atrial size was moderately dilated.  4. Right atrial size was mildly dilated.  5. The mitral valve is normal in structure. Moderate to severe mitral valve regurgitation.  6. Tricuspid valve regurgitation is moderate.  7. The aortic valve is calcified. Aortic valve  regurgitation is trivial. Moderate aortic valve stenosis. FINDINGS  Left Ventricle: Left ventricular ejection fraction, by estimation, is 25 to 30%. The left ventricle has severely decreased function. The left ventricle demonstrates global hypokinesis. The left ventricular internal cavity size was moderately dilated. There is moderate left ventricular hypertrophy. Left ventricular diastolic parameters are consistent with Grade II diastolic dysfunction (pseudonormalization). Right Ventricle: The right ventricular size is normal. No increase in right ventricular wall thickness. Right ventricular systolic function is normal. Left Atrium: Left atrial size was moderately dilated. Right Atrium: Right atrial size was mildly dilated. Pericardium: There is no evidence of pericardial effusion. Mitral Valve: The mitral valve is normal in structure. Moderate to severe mitral valve regurgitation. Tricuspid Valve: The tricuspid valve is normal in structure. Tricuspid valve regurgitation is moderate. Aortic Valve: The aortic valve is calcified. Aortic valve regurgitation is trivial. Moderate aortic stenosis is present. Aortic valve mean gradient measures 27.5 mmHg. Aortic valve peak gradient measures 47.9 mmHg. Aortic valve area, by VTI measures 0.84  cm. Pulmonic Valve: The pulmonic valve was normal in structure. Pulmonic valve regurgitation is trivial. Aorta: The aortic root and ascending aorta are structurally normal, with no evidence of dilitation. IAS/Shunts: No atrial level shunt detected by color flow Doppler.  LEFT VENTRICLE PLAX 2D LVIDd:         6.60 cm      Diastology LVIDs:         6.00 cm      LV e' medial:    4.13 cm/s LV PW:         1.20 cm      LV E/e' medial:  15.4 LV IVS:        1.00 cm      LV e' lateral:   8.81 cm/s LVOT diam:     2.20 cm      LV E/e' lateral: 7.2 LV SV:         69 LV SV Index:   37 LVOT Area:     3.80 cm  LV Volumes (MOD) LV vol d, MOD A2C: 179.0 ml LV vol d, MOD A4C: 197.0 ml LV vol s, MOD  A2C: 106.0 ml LV vol s, MOD A4C: 128.0 ml LV SV MOD A2C:     73.0 ml LV SV MOD A4C:     197.0 ml LV SV MOD BP:      72.4 ml RIGHT VENTRICLE RV Basal diam:  3.30 cm RV S prime:     9.14 cm/s  LEFT ATRIUM             Index        RIGHT ATRIUM           Index LA diam:        4.80 cm 2.60 cm/m   RA Area:     15.50 cm LA Vol (A2C):   42.4 ml 22.93 ml/m  RA Volume:   37.30 ml  20.17 ml/m LA Vol (A4C):   42.4 ml 22.93 ml/m LA Biplane Vol: 44.4 ml 24.01 ml/m  AORTIC VALVE                     PULMONIC VALVE AV Area (Vmax):    0.88 cm      PV Vmax:       1.12 m/s AV Area (Vmean):   0.81 cm      PV Vmean:      80.200 cm/s AV Area (VTI):     0.84 cm      PV VTI:        0.281 m AV Vmax:           346.00 cm/s   PV Peak grad:  5.0 mmHg AV Vmean:          244.500 cm/s  PV Mean grad:  3.0 mmHg AV VTI:            0.824 m AV Peak Grad:      47.9 mmHg AV Mean Grad:      27.5 mmHg LVOT Vmax:         80.20 cm/s LVOT Vmean:        51.900 cm/s LVOT VTI:          0.182 m LVOT/AV VTI ratio: 0.22  AORTA Ao Root diam: 3.50 cm MITRAL VALVE MV Area (PHT): 3.54 cm    SHUNTS MV Decel Time: 214 msec    Systemic VTI:  0.18 m MV E velocity: 63.40 cm/s  Systemic Diam: 2.20 cm MV A velocity: 98.00 cm/s MV E/A ratio:  0.65 Serafina Royals MD Electronically signed by Serafina Royals MD Signature Date/Time: 03/14/2022/12:52:39 PM    Final    DG Chest 2 View  Result Date: 03/13/2022 CLINICAL DATA:  Shortness of breath.  Low blood pressure. EXAM: CHEST - 2 VIEW COMPARISON:  Chest CT 12/01/2021.  Radiographs 05/14/2015. FINDINGS: The heart size and mediastinal contours are stable status post median sternotomy and CABG. The lungs are clear. There is no pleural effusion or pneumothorax. No acute osseous findings are demonstrated. IMPRESSION: Stable chest. No acute cardiopulmonary process. Previous CABG. Electronically Signed   By: Richardean Sale M.D.   On: 03/13/2022 16:58     Medications:    sodium chloride 50 mL/hr at 03/14/22 1102     aspirin EC  81 mg Oral Daily   atorvastatin  80 mg Oral Daily   calcitRIOL  0.25 mcg Oral Daily   cinacalcet  30 mg Oral Q supper   ezetimibe  10 mg Oral Daily   gabapentin  300 mg Oral QHS   heparin  5,000 Units Subcutaneous Q8H   insulin aspart  0-5 Units Subcutaneous QHS   insulin aspart  0-9 Units Subcutaneous TID WC   insulin aspart  4 Units Subcutaneous TID WC   insulin glargine-yfgn  25 Units Subcutaneous QHS   midodrine  10 mg Oral TID WC   multivitamin with minerals  1 tablet Oral Daily   omega-3 acid ethyl esters  3 g Oral Daily   pantoprazole  40 mg Oral BID AC   ondansetron **OR** ondansetron (ZOFRAN) IV  Assessment/ Plan:  Mr. Roger Knight is a 71 y.o.  male with past medical conditions including diabetes, hyperlipidemia, hypertension, CAD status post CABG, end-stage renal disease on peritoneal dialysis.  Patient presents to the emergency department with complaints of weakness, weight loss, and low blood pressure.  Patient has been admitted under observation for Syncope [R55]  [facility]-PD 4 exchanges/2.5 L feels/no last fill  Hypotension, appears orthostatic.  Not currently prescribed antihypertensives.  Will like to rule out cardiac involvement.  Bedside echo appears negative for effusion.  Currently receiving midodrine 10 mg 3 times daily.  2.  End-stage renal disease on peritoneal dialysis.  We will continue nightly treatments during this admission.  Will use 1.5% dialysate as patient has very little fluid volume.  3. Anemia of chronic kidney disease Lab Results  Component Value Date   HGB 10.1 (L) 03/14/2022    Hemoglobin remains within desired range.  We will continue to monitor.  4. Secondary Hyperparathyroidism: with outpatient labs: PTH 388, phosphorus 4.4, calcium 8.1 on 01/24/22.   Lab Results  Component Value Date   CALCIUM 8.7 (L) 03/14/2022      LOS: 0   11/21/20232:31 PM

## 2022-03-15 DIAGNOSIS — N186 End stage renal disease: Secondary | ICD-10-CM | POA: Diagnosis not present

## 2022-03-15 DIAGNOSIS — Z992 Dependence on renal dialysis: Secondary | ICD-10-CM

## 2022-03-15 DIAGNOSIS — D509 Iron deficiency anemia, unspecified: Secondary | ICD-10-CM | POA: Diagnosis not present

## 2022-03-15 DIAGNOSIS — E871 Hypo-osmolality and hyponatremia: Secondary | ICD-10-CM | POA: Diagnosis not present

## 2022-03-15 DIAGNOSIS — D631 Anemia in chronic kidney disease: Secondary | ICD-10-CM | POA: Diagnosis not present

## 2022-03-15 DIAGNOSIS — I951 Orthostatic hypotension: Secondary | ICD-10-CM | POA: Diagnosis not present

## 2022-03-15 DIAGNOSIS — Z23 Encounter for immunization: Secondary | ICD-10-CM | POA: Diagnosis not present

## 2022-03-15 LAB — GLUCOSE, CAPILLARY: Glucose-Capillary: 100 mg/dL — ABNORMAL HIGH (ref 70–99)

## 2022-03-15 NOTE — Discharge Summary (Signed)
Physician Discharge Summary  [name] SWF:[id number] DOB: 1950-11-09 DOA: 03/13/2022  PCP: Roger Ramsay, MD  Admit date: 03/13/2022 Discharge date: 03/15/2022  Admitted From: home  Disposition:  home w/ home health   Recommendations for Outpatient Follow-up:  Follow up with PCP in 1-2 weeks F/u w/ cardio, Dr. Saralyn Knight or Dr. Ubaldo Knight, w/in 1 week   Home Health: yes Equipment/Devices:  Discharge Condition: stable CODE STATUS: full  Diet recommendation: Heart Healthy / Carb Modified   Brief/Interim Summary: HPI was taken from Dr. Cruzita Knight: Roger Knight is a 71 y.o. male with medical history significant of ESRD on peritoneal dialysis for the past 1.5 years, CAD status post CABG, DM 2, HTN, HLD, who comes to the hospital with complaints of progressive weakness, weight loss, acute on chronic over the last several months but especially more so this morning when he was extremely weak, his legs gave out and he almost passed out.  He denies any chest pain, denies any shortness of breath, no palpitations.  Felt like his legs became very weak, denies any focal weakness but it was more generalized.  He reports poor p.o. intake over the last several months, resulting in a 60 pound weight loss since January 2023.  He was also found to be more hypotensive as an outpatient and was recently started on midodrine.  Due to presyncopal episode this morning he decided to come to the hospital.  He denies any fever or chills, reports that his PD dialysis catheter is working well.  No abdominal pain   ED Course: In the ED initially he was hypotensive in the 90s but improved into the low 100s.  He is afebrile.  Blood work shows an elevated high-sensitivity troponin of 157, creatinine is 4.2, magnesium 1.5.  Sodium is 134.  CBC fairly unremarkable, hemoglobin is 12.4.  Chest x-ray is unremarkable.  As per Dr. Leslye Knight: 71 year old male with past medical history of end-stage renal disease on  peritoneal dialysis, CAD, type 2 diabetes mellitus, hyperlipidemia presents to the hospital with presyncope and weakness.  Patient has been more hypotensive as outpatient and recently started on midodrine.   Patient was orthostatic on 03/14/2022.  Midodrine was increased to 10 mg 3 times a day and a fluid bolus was ordered.    As per Dr. Jimmye Knight 03/15/22: Pt denied any lightheadedness, dizziness, weakness, chest pain, or shortness of breath. Pt was able to independently walk to the bathroom this morning w/o any symptoms. Pt will f/u w/ cardio, Dr. Saralyn Knight or Dr. Ubaldo Knight, w/in 1 week. Pt and pt's spouse verbalized their understanding.    Discharge Diagnoses:  Principal Problem:   Orthostatic hypotension Active Problems:   Anemia of chronic disease   Type 2 diabetes mellitus with hyperlipidemia (HCC)   Near syncope   Diabetic neuropathy (HCC)   ESRD (end stage renal disease) (HCC)   Hyponatremia   Syncope  Orthostatic hypotension: continue on midodrine.    Hyponatremia: labile. Will be managed w/ PD    ESRD: on PD. Nephro following and recs apprec   Diabetic neuropathy: continue on gabapentin    Near syncope: likely secondary to orthostatic hypotension    DM2: well controlled, HbA1c 6.6. Continue on glargine, SSI.  HLD: continue on statin, zetia    Anemia of chronic disease: likely secondary to ESRD. No need for a transfusion currently   Discharge Instructions  Discharge Instructions     Diet - low sodium heart healthy   Complete by: As directed  Diet Carb Modified   Complete by: As directed    Discharge instructions   Complete by: As directed    F/u w/ cardio, Dr. Saralyn Knight or Dr. Ubaldo Knight, within 1 week. F/u w/ PCP in 1-2 weeks   Discharge wound care:   Complete by: As directed    Clean skin near exit site with chloraprep swab sticks.  Starting at catheter, use circular pattern around exit site, moving towards outer edges of area covered by dressing.  Apply gentamicin  cream to site once daily.  Cover with dry dressing.   Increase activity slowly   Complete by: As directed       Allergies as of 03/15/2022   No Known Allergies      Medication List     STOP taking these medications    atorvastatin 80 MG tablet Commonly known as: LIPITOR       TAKE these medications    aspirin 81 MG tablet Take 81 mg by mouth daily.   calcitRIOL 0.25 MCG capsule Commonly known as: ROCALTROL Take 0.25 mcg by mouth daily.   cetirizine 10 MG tablet Commonly known as: ZYRTEC Take 10 mg by mouth daily.   cinacalcet 30 MG tablet Commonly known as: SENSIPAR Take 30 mg by mouth daily with supper.   ezetimibe 10 MG tablet Commonly known as: ZETIA Take 10 mg by mouth daily.   Fiber 625 MG Tabs Take 2 capsules by mouth in the morning and at bedtime.   gabapentin 300 MG capsule Commonly known as: NEURONTIN Take 300 mg by mouth at bedtime.   insulin aspart 100 UNIT/ML injection Commonly known as: novoLOG Inject 20 Units into the skin 3 (three) times daily.   Insulin Pen Needle 32G X 4 MM Misc USE WITH INJECTIONS 4 TIMES DAILY   midodrine 10 MG tablet Commonly known as: PROAMATINE Take 10 mg by mouth 3 (three) times daily.   multivitamin with minerals tablet Take 1 tablet by mouth daily.   nitroGLYCERIN 0.4 MG SL tablet Commonly known as: NITROSTAT Place 0.4 mg under the tongue every 5 (five) minutes as needed for chest pain.   omega-3 fish oil 1000 MG Caps capsule Commonly known as: MAXEPA Take 3 capsules by mouth daily.   pantoprazole 40 MG tablet Commonly known as: PROTONIX Take 40 mg by mouth 2 (two) times daily before a meal.   sildenafil 100 MG tablet Commonly known as: VIAGRA Take 100 mg by mouth as needed for erectile dysfunction. Reported on 06/14/2015   Tresiba FlexTouch 200 UNIT/ML FlexTouch Pen Generic drug: insulin degludec Inject 90 Units into the skin at bedtime.   triamcinolone cream 0.1 % Commonly known as:  KENALOG Apply 1 application topically 2 (two) times daily as needed (irritation).               Durable Medical Equipment  (From admission, onward)           Start     Ordered   03/14/22 1629  For home use only DME Bedside commode  Once       Question:  Patient needs a bedside commode to treat with the following condition  Answer:  Weakness   03/14/22 1629   03/14/22 1439  For home use only DME Walker rolling  Once       Question Answer Comment  Walker: With [place] Wheels   Patient needs a walker to treat with the following condition Weakness generalized      03/14/22 1438  03/14/22 1439  For home use only DME Shower stool  Once       Comments: With a back rest   03/14/22 1438              Discharge Care Instructions  (From admission, onward)           Start     Ordered   03/15/22 0000  Discharge wound care:       Comments: Clean skin near exit site with chloraprep swab sticks.  Starting at catheter, use circular pattern around exit site, moving towards outer edges of area covered by dressing.  Apply gentamicin cream to site once daily.  Cover with dry dressing.   03/15/22 1029            No Known Allergies  Consultations: Nephro    Procedures/Studies: ECHOCARDIOGRAM COMPLETE  Result Date: 03/14/2022    ECHOCARDIOGRAM REPORT   Patient Name:   Roger Knight Date of Exam: 03/14/2022 Medical Rec #:  [id number]           Height:       67.0 in Accession #:    [id number]          Weight:       162.0 lb Date of Birth:  January 16, 1951           BSA:          1.849 m Patient Age:    26 years            BP:           117/65 mmHg Patient Gender: M                   HR:           59 bpm. Exam Location:  [facility] Procedure: 2D Echo, Color Doppler and Cardiac Doppler Indications:     R55 Syncope  History:         Patient has no prior history of Echocardiogram examinations.                  CKD; Risk Factors:Hypertension, Diabetes and Dyslipidemia.  Sonographer:      Charmayne Sheer Referring Phys:  [place] Diagnosing Phys: Serafina Royals MD  Sonographer Comments: Suboptimal subcostal window. IMPRESSIONS  1. Left ventricular ejection fraction, by estimation, is 25 to 30%. The left ventricle has severely decreased function. The left ventricle demonstrates global hypokinesis. The left ventricular internal cavity size was moderately dilated. There is moderate left ventricular hypertrophy. Left ventricular diastolic parameters are consistent with Grade II diastolic dysfunction (pseudonormalization).  2. Right ventricular systolic function is normal. The right ventricular size is normal.  3. Left atrial size was moderately dilated.  4. Right atrial size was mildly dilated.  5. The mitral valve is normal in structure. Moderate to severe mitral valve regurgitation.  6. Tricuspid valve regurgitation is moderate.  7. The aortic valve is calcified. Aortic valve regurgitation is trivial. Moderate aortic valve stenosis. FINDINGS  Left Ventricle: Left ventricular ejection fraction, by estimation, is 25 to 30%. The left ventricle has severely decreased function. The left ventricle demonstrates global hypokinesis. The left ventricular internal cavity size was moderately dilated. There is moderate left ventricular hypertrophy. Left ventricular diastolic parameters are consistent with Grade II diastolic dysfunction (pseudonormalization). Right Ventricle: The right ventricular size is normal. No increase in right ventricular wall thickness. Right ventricular systolic function is normal. Left Atrium: Left atrial size was moderately dilated. Right  Atrium: Right atrial size was mildly dilated. Pericardium: There is no evidence of pericardial effusion. Mitral Valve: The mitral valve is normal in structure. Moderate to severe mitral valve regurgitation. Tricuspid Valve: The tricuspid valve is normal in structure. Tricuspid valve regurgitation is moderate. Aortic Valve: The aortic valve is  calcified. Aortic valve regurgitation is trivial. Moderate aortic stenosis is present. Aortic valve mean gradient measures 27.5 mmHg. Aortic valve peak gradient measures 47.9 mmHg. Aortic valve area, by VTI measures 0.84  cm. Pulmonic Valve: The pulmonic valve was normal in structure. Pulmonic valve regurgitation is trivial. Aorta: The aortic root and ascending aorta are structurally normal, with no evidence of dilitation. IAS/Shunts: No atrial level shunt detected by color flow Doppler.  LEFT VENTRICLE PLAX 2D LVIDd:         6.60 cm      Diastology LVIDs:         6.00 cm      LV e' medial:    4.13 cm/s LV PW:         1.20 cm      LV E/e' medial:  15.4 LV IVS:        1.00 cm      LV e' lateral:   8.81 cm/s LVOT diam:     2.20 cm      LV E/e' lateral: 7.2 LV SV:         69 LV SV Index:   37 LVOT Area:     3.80 cm  LV Volumes (MOD) LV vol d, MOD A2C: 179.0 ml LV vol d, MOD A4C: 197.0 ml LV vol s, MOD A2C: 106.0 ml LV vol s, MOD A4C: 128.0 ml LV SV MOD A2C:     73.0 ml LV SV MOD A4C:     197.0 ml LV SV MOD BP:      72.4 ml RIGHT VENTRICLE RV Basal diam:  3.30 cm RV S prime:     9.14 cm/s LEFT ATRIUM             Index        RIGHT ATRIUM           Index LA diam:        4.80 cm 2.60 cm/m   RA Area:     15.50 cm LA Vol (A2C):   42.4 ml 22.93 ml/m  RA Volume:   37.30 ml  20.17 ml/m LA Vol (A4C):   42.4 ml 22.93 ml/m LA Biplane Vol: 44.4 ml 24.01 ml/m  AORTIC VALVE                     PULMONIC VALVE AV Area (Vmax):    0.88 cm      PV Vmax:       1.12 m/s AV Area (Vmean):   0.81 cm      PV Vmean:      80.200 cm/s AV Area (VTI):     0.84 cm      PV VTI:        0.281 m AV Vmax:           346.00 cm/s   PV Peak grad:  5.0 mmHg AV Vmean:          244.500 cm/s  PV Mean grad:  3.0 mmHg AV VTI:            0.824 m AV Peak Grad:      47.9 mmHg AV Mean Grad:      27.5  mmHg LVOT Vmax:         80.20 cm/s LVOT Vmean:        51.900 cm/s LVOT VTI:          0.182 m LVOT/AV VTI ratio: 0.22  AORTA Ao Root diam: 3.50 cm MITRAL VALVE  MV Area (PHT): 3.54 cm    SHUNTS MV Decel Time: 214 msec    Systemic VTI:  0.18 m MV E velocity: 63.40 cm/s  Systemic Diam: 2.20 cm MV A velocity: 98.00 cm/s MV E/A ratio:  0.65 Serafina Royals MD Electronically signed by Serafina Royals MD Signature Date/Time: 03/14/2022/12:52:39 PM    Final    DG Chest 2 View  Result Date: 03/13/2022 CLINICAL DATA:  Shortness of breath.  Low blood pressure. EXAM: CHEST - 2 VIEW COMPARISON:  Chest CT 12/01/2021.  Radiographs 05/14/2015. FINDINGS: The heart size and mediastinal contours are stable status post median sternotomy and CABG. The lungs are clear. There is no pleural effusion or pneumothorax. No acute osseous findings are demonstrated. IMPRESSION: Stable chest. No acute cardiopulmonary process. Previous CABG. Electronically Signed   By: Richardean Sale M.D.   On: 03/13/2022 16:58   (Echo, Carotid, EGD, Colonoscopy, ERCP)    Subjective: Pt denies any complaints    Discharge Exam: Vitals:   03/14/22 2351 03/15/22 0744  BP: 100/74 103/64  Pulse: 61 62  Resp: 20 16  Temp: 97.8 F (36.6 C) 98.2 F (36.8 C)  SpO2: 97% 99%   Vitals:   03/14/22 1806 03/14/22 2351 03/15/22 0500 03/15/22 0744  BP:  100/74  103/64  Pulse:  61  62  Resp:  20  16  Temp:  97.8 F (36.6 C)  98.2 F (36.8 C)  TempSrc:      SpO2:  97%  99%  Weight: 79 kg  77.5 kg   Height:        General: Pt is alert, awake, not in acute distress Cardiovascular: S1/S2 +, no rubs, no gallops Respiratory: CTA bilaterally, no wheezing, no rhonchi Abdominal: Soft, NT, ND, bowel sounds + Extremities: no cyanosis    The results of significant diagnostics from this hospitalization (including imaging, microbiology, ancillary and laboratory) are listed below for reference.     Microbiology: No results found for this or any previous visit (from the past 240 hour(s)).   Labs: BNP (last 3 results) No results for input(s): "BNP" in the last 8760 hours. Basic Metabolic Panel: Recent  Labs  Lab 03/13/22 1148 03/14/22 0549  NA 134* 132*  K 4.3 3.7  CL 96* 98  CO2 27 26  GLUCOSE 171* 98  BUN 38* 41*  CREATININE 4.22* 4.24*  CALCIUM 9.1 8.7*  MG 1.5*  --    Liver Function Tests: Recent Labs  Lab 03/13/22 1148 03/14/22 0549  AST 45* 32  ALT 38 27  ALKPHOS 107 83  BILITOT 0.7 0.8  PROT 6.0* 5.0*  ALBUMIN 2.3* 2.0*   No results for input(s): "LIPASE", "AMYLASE" in the last 168 hours. No results for input(s): "AMMONIA" in the last 168 hours. CBC: Recent Labs  Lab 03/13/22 1148 03/14/22 0549  WBC 9.4 7.5  NEUTROABS 7.5  --   HGB 12.4* 10.1*  HCT 36.6* 29.5*  MCV 88.6 89.4  PLT 267 196   Cardiac Enzymes: No results for input(s): "CKTOTAL", "CKMB", "CKMBINDEX", "TROPONINI" in the last 168 hours. BNP: Invalid input(s): "POCBNP" CBG: Recent Labs  Lab 03/14/22 0718 03/14/22 1147 03/14/22 1630 03/14/22 2111 03/15/22 0750  GLUCAP 99 147*  89 129* 100*   D-Dimer No results for input(s): "DDIMER" in the last 72 hours. Hgb A1c Recent Labs    03/13/22 1921  HGBA1C 6.6*   Lipid Profile No results for input(s): "CHOL", "HDL", "LDLCALC", "TRIG", "CHOLHDL", "LDLDIRECT" in the last 72 hours. Thyroid function studies Recent Labs    03/14/22 0549  TSH 1.545   Anemia work up No results for input(s): "VITAMINB12", "FOLATE", "FERRITIN", "TIBC", "IRON", "RETICCTPCT" in the last 72 hours. Urinalysis    Component Value Date/Time   COLORURINE STRAW (A) 05/14/2015 1011   APPEARANCEUR CLEAR (A) 05/14/2015 1011   APPEARANCEUR Clear 03/25/2013 0850   LABSPEC 1.007 05/14/2015 1011   LABSPEC 1.023 03/25/2013 0850   PHURINE 6.0 05/14/2015 1011   GLUCOSEU >500 (A) 05/14/2015 1011   GLUCOSEU 50 mg/dL 03/25/2013 0850   HGBUR 1+ (A) 05/14/2015 1011   BILIRUBINUR NEGATIVE 05/14/2015 1011   BILIRUBINUR Negative 03/25/2013 0850   KETONESUR NEGATIVE 05/14/2015 1011   PROTEINUR 100 (A) 05/14/2015 1011   NITRITE NEGATIVE 05/14/2015 1011   LEUKOCYTESUR  NEGATIVE 05/14/2015 1011   LEUKOCYTESUR Negative 03/25/2013 0850   Sepsis Labs Recent Labs  Lab 03/13/22 1148 03/14/22 0549  WBC 9.4 7.5   Microbiology No results found for this or any previous visit (from the past 240 hour(s)).   Time coordinating discharge: Over 30 minutes  SIGNED:   Wyvonnia Dusky, MD  [facility] 03/15/2022, 10:31 AM Pager   If 7PM-7AM, please contact night-coverage [email or web address]

## 2022-03-15 NOTE — Progress Notes (Signed)
PT did PD last night, tolerated well with no issues.  Initial drain = 0 Avg dwell = 120 min Avg drain = 30 min Total therapy volume = 10000 Total therapy time = 11 hr 13 min UF = 292 ml  No issues, tolerated well.

## 2022-03-15 NOTE — Plan of Care (Signed)

## 2022-03-15 NOTE — TOC Progression Note (Signed)
Transition of Care [facility]) - Progression Note    Patient Details  Name: Roger Knight MRN: [id number] Date of Birth: Apr 14, 1951  [facility] CM/SW [place], RN Phone Number: 03/15/2022, 10:38 AM  Clinical Narrative:     The patient is ready to DC home with his wife, The DME is in the room, [facility] is set up with Home health,    Expected Discharge Plan: [place] Barriers to Discharge: Continued Medical Work up  Expected Discharge Plan and Services Expected Discharge Plan: [place]   Discharge Planning Services: CM Consult Post Acute Care Choice: [place] arrangements for the past 2 months: Single Family Home Expected Discharge Date: 03/15/22               DME Arranged: [place] Rile rolling DME Agency: [employer] Date DME Agency Contacted: 03/14/22 Time DME Agency Contacted: [phone] Representative spoke with at DME Agency: [place]: PT, OT [place] Agency: [place] ([place]) Date [place]: 03/14/22 Time [place]: 1513 Representative spoke with at [place]: [place] ([place]) Interventions    Readmission Risk Interventions     No data to display

## 2022-03-16 DIAGNOSIS — Z992 Dependence on renal dialysis: Secondary | ICD-10-CM | POA: Diagnosis not present

## 2022-03-16 DIAGNOSIS — Z23 Encounter for immunization: Secondary | ICD-10-CM | POA: Diagnosis not present

## 2022-03-16 DIAGNOSIS — D509 Iron deficiency anemia, unspecified: Secondary | ICD-10-CM | POA: Diagnosis not present

## 2022-03-16 DIAGNOSIS — D631 Anemia in chronic kidney disease: Secondary | ICD-10-CM | POA: Diagnosis not present

## 2022-03-16 DIAGNOSIS — N186 End stage renal disease: Secondary | ICD-10-CM | POA: Diagnosis not present

## 2022-03-16 LAB — HEPATITIS B SURFACE ANTIBODY, QUANTITATIVE: Hep B S AB Quant (Post): 192 m[IU]/mL (ref >9.9)

## 2022-03-17 DIAGNOSIS — E785 Hyperlipidemia, unspecified: Secondary | ICD-10-CM | POA: Diagnosis not present

## 2022-03-17 DIAGNOSIS — I120 Hypertensive chronic kidney disease with stage 5 chronic kidney disease or end stage renal disease: Secondary | ICD-10-CM | POA: Diagnosis not present

## 2022-03-17 DIAGNOSIS — I951 Orthostatic hypotension: Secondary | ICD-10-CM | POA: Diagnosis not present

## 2022-03-17 DIAGNOSIS — Z7982 Long term (current) use of aspirin: Secondary | ICD-10-CM | POA: Diagnosis not present

## 2022-03-17 DIAGNOSIS — R634 Abnormal weight loss: Secondary | ICD-10-CM | POA: Diagnosis not present

## 2022-03-17 DIAGNOSIS — Z23 Encounter for immunization: Secondary | ICD-10-CM | POA: Diagnosis not present

## 2022-03-17 DIAGNOSIS — I2510 Atherosclerotic heart disease of native coronary artery without angina pectoris: Secondary | ICD-10-CM | POA: Diagnosis not present

## 2022-03-17 DIAGNOSIS — Z794 Long term (current) use of insulin: Secondary | ICD-10-CM | POA: Diagnosis not present

## 2022-03-17 DIAGNOSIS — N186 End stage renal disease: Secondary | ICD-10-CM | POA: Diagnosis not present

## 2022-03-17 DIAGNOSIS — Z992 Dependence on renal dialysis: Secondary | ICD-10-CM | POA: Diagnosis not present

## 2022-03-17 DIAGNOSIS — E1122 Type 2 diabetes mellitus with diabetic chronic kidney disease: Secondary | ICD-10-CM | POA: Diagnosis not present

## 2022-03-17 DIAGNOSIS — Z6825 Body mass index (BMI) 25.0-25.9, adult: Secondary | ICD-10-CM | POA: Diagnosis not present

## 2022-03-17 DIAGNOSIS — D509 Iron deficiency anemia, unspecified: Secondary | ICD-10-CM | POA: Diagnosis not present

## 2022-03-17 DIAGNOSIS — D631 Anemia in chronic kidney disease: Secondary | ICD-10-CM | POA: Diagnosis not present

## 2022-03-17 DIAGNOSIS — Z9181 History of falling: Secondary | ICD-10-CM | POA: Diagnosis not present

## 2022-03-17 DIAGNOSIS — E1169 Type 2 diabetes mellitus with other specified complication: Secondary | ICD-10-CM | POA: Diagnosis not present

## 2022-03-17 DIAGNOSIS — E1140 Type 2 diabetes mellitus with diabetic neuropathy, unspecified: Secondary | ICD-10-CM | POA: Diagnosis not present

## 2022-03-17 LAB — HEPATITIS B E ANTIBODY: Hep B E Ab: NEGATIVE

## 2022-03-18 DIAGNOSIS — D509 Iron deficiency anemia, unspecified: Secondary | ICD-10-CM | POA: Diagnosis not present

## 2022-03-18 DIAGNOSIS — D631 Anemia in chronic kidney disease: Secondary | ICD-10-CM | POA: Diagnosis not present

## 2022-03-18 DIAGNOSIS — Z992 Dependence on renal dialysis: Secondary | ICD-10-CM | POA: Diagnosis not present

## 2022-03-18 DIAGNOSIS — Z23 Encounter for immunization: Secondary | ICD-10-CM | POA: Diagnosis not present

## 2022-03-18 DIAGNOSIS — N186 End stage renal disease: Secondary | ICD-10-CM | POA: Diagnosis not present

## 2022-03-19 DIAGNOSIS — D509 Iron deficiency anemia, unspecified: Secondary | ICD-10-CM | POA: Diagnosis not present

## 2022-03-19 DIAGNOSIS — N186 End stage renal disease: Secondary | ICD-10-CM | POA: Diagnosis not present

## 2022-03-19 DIAGNOSIS — D631 Anemia in chronic kidney disease: Secondary | ICD-10-CM | POA: Diagnosis not present

## 2022-03-19 DIAGNOSIS — Z992 Dependence on renal dialysis: Secondary | ICD-10-CM | POA: Diagnosis not present

## 2022-03-19 DIAGNOSIS — Z23 Encounter for immunization: Secondary | ICD-10-CM | POA: Diagnosis not present

## 2022-03-20 DIAGNOSIS — D631 Anemia in chronic kidney disease: Secondary | ICD-10-CM | POA: Diagnosis not present

## 2022-03-20 DIAGNOSIS — N186 End stage renal disease: Secondary | ICD-10-CM | POA: Diagnosis not present

## 2022-03-20 DIAGNOSIS — Z23 Encounter for immunization: Secondary | ICD-10-CM | POA: Diagnosis not present

## 2022-03-20 DIAGNOSIS — Z992 Dependence on renal dialysis: Secondary | ICD-10-CM | POA: Diagnosis not present

## 2022-03-20 DIAGNOSIS — D509 Iron deficiency anemia, unspecified: Secondary | ICD-10-CM | POA: Diagnosis not present

## 2022-03-21 DIAGNOSIS — N2581 Secondary hyperparathyroidism of renal origin: Secondary | ICD-10-CM | POA: Diagnosis not present

## 2022-03-21 DIAGNOSIS — Z9289 Personal history of other medical treatment: Secondary | ICD-10-CM | POA: Diagnosis not present

## 2022-03-21 DIAGNOSIS — E1122 Type 2 diabetes mellitus with diabetic chronic kidney disease: Secondary | ICD-10-CM | POA: Diagnosis not present

## 2022-03-21 DIAGNOSIS — I10 Essential (primary) hypertension: Secondary | ICD-10-CM | POA: Diagnosis not present

## 2022-03-21 DIAGNOSIS — Z992 Dependence on renal dialysis: Secondary | ICD-10-CM | POA: Diagnosis not present

## 2022-03-21 DIAGNOSIS — Z23 Encounter for immunization: Secondary | ICD-10-CM | POA: Diagnosis not present

## 2022-03-21 DIAGNOSIS — N186 End stage renal disease: Secondary | ICD-10-CM | POA: Diagnosis not present

## 2022-03-21 DIAGNOSIS — Z794 Long term (current) use of insulin: Secondary | ICD-10-CM | POA: Diagnosis not present

## 2022-03-21 DIAGNOSIS — D631 Anemia in chronic kidney disease: Secondary | ICD-10-CM | POA: Diagnosis not present

## 2022-03-21 DIAGNOSIS — E1159 Type 2 diabetes mellitus with other circulatory complications: Secondary | ICD-10-CM | POA: Diagnosis not present

## 2022-03-21 DIAGNOSIS — E1142 Type 2 diabetes mellitus with diabetic polyneuropathy: Secondary | ICD-10-CM | POA: Diagnosis not present

## 2022-03-21 DIAGNOSIS — D509 Iron deficiency anemia, unspecified: Secondary | ICD-10-CM | POA: Diagnosis not present

## 2022-03-22 DIAGNOSIS — I200 Unstable angina: Secondary | ICD-10-CM | POA: Diagnosis not present

## 2022-03-22 DIAGNOSIS — Z992 Dependence on renal dialysis: Secondary | ICD-10-CM | POA: Diagnosis not present

## 2022-03-22 DIAGNOSIS — E785 Hyperlipidemia, unspecified: Secondary | ICD-10-CM | POA: Diagnosis not present

## 2022-03-22 DIAGNOSIS — D509 Iron deficiency anemia, unspecified: Secondary | ICD-10-CM | POA: Diagnosis not present

## 2022-03-22 DIAGNOSIS — N186 End stage renal disease: Secondary | ICD-10-CM | POA: Diagnosis not present

## 2022-03-22 DIAGNOSIS — E1159 Type 2 diabetes mellitus with other circulatory complications: Secondary | ICD-10-CM | POA: Diagnosis not present

## 2022-03-22 DIAGNOSIS — I2510 Atherosclerotic heart disease of native coronary artery without angina pectoris: Secondary | ICD-10-CM | POA: Diagnosis not present

## 2022-03-22 DIAGNOSIS — I10 Essential (primary) hypertension: Secondary | ICD-10-CM | POA: Diagnosis not present

## 2022-03-22 DIAGNOSIS — I700 Atherosclerosis of aorta: Secondary | ICD-10-CM | POA: Diagnosis not present

## 2022-03-22 DIAGNOSIS — E1142 Type 2 diabetes mellitus with diabetic polyneuropathy: Secondary | ICD-10-CM | POA: Diagnosis not present

## 2022-03-22 DIAGNOSIS — Z23 Encounter for immunization: Secondary | ICD-10-CM | POA: Diagnosis not present

## 2022-03-22 DIAGNOSIS — D631 Anemia in chronic kidney disease: Secondary | ICD-10-CM | POA: Diagnosis not present

## 2022-03-22 DIAGNOSIS — I420 Dilated cardiomyopathy: Secondary | ICD-10-CM | POA: Diagnosis not present

## 2022-03-23 DIAGNOSIS — N186 End stage renal disease: Secondary | ICD-10-CM | POA: Diagnosis not present

## 2022-03-23 DIAGNOSIS — Z23 Encounter for immunization: Secondary | ICD-10-CM | POA: Diagnosis not present

## 2022-03-23 DIAGNOSIS — I951 Orthostatic hypotension: Secondary | ICD-10-CM | POA: Diagnosis not present

## 2022-03-23 DIAGNOSIS — I120 Hypertensive chronic kidney disease with stage 5 chronic kidney disease or end stage renal disease: Secondary | ICD-10-CM | POA: Diagnosis not present

## 2022-03-23 DIAGNOSIS — E1140 Type 2 diabetes mellitus with diabetic neuropathy, unspecified: Secondary | ICD-10-CM | POA: Diagnosis not present

## 2022-03-23 DIAGNOSIS — D509 Iron deficiency anemia, unspecified: Secondary | ICD-10-CM | POA: Diagnosis not present

## 2022-03-23 DIAGNOSIS — Z992 Dependence on renal dialysis: Secondary | ICD-10-CM | POA: Diagnosis not present

## 2022-03-23 DIAGNOSIS — D631 Anemia in chronic kidney disease: Secondary | ICD-10-CM | POA: Diagnosis not present

## 2022-03-23 DIAGNOSIS — E1122 Type 2 diabetes mellitus with diabetic chronic kidney disease: Secondary | ICD-10-CM | POA: Diagnosis not present

## 2022-03-24 DIAGNOSIS — I120 Hypertensive chronic kidney disease with stage 5 chronic kidney disease or end stage renal disease: Secondary | ICD-10-CM | POA: Diagnosis not present

## 2022-03-24 DIAGNOSIS — D631 Anemia in chronic kidney disease: Secondary | ICD-10-CM | POA: Diagnosis not present

## 2022-03-24 DIAGNOSIS — D509 Iron deficiency anemia, unspecified: Secondary | ICD-10-CM | POA: Diagnosis not present

## 2022-03-24 DIAGNOSIS — Z992 Dependence on renal dialysis: Secondary | ICD-10-CM | POA: Diagnosis not present

## 2022-03-24 DIAGNOSIS — E1122 Type 2 diabetes mellitus with diabetic chronic kidney disease: Secondary | ICD-10-CM | POA: Diagnosis not present

## 2022-03-24 DIAGNOSIS — E1140 Type 2 diabetes mellitus with diabetic neuropathy, unspecified: Secondary | ICD-10-CM | POA: Diagnosis not present

## 2022-03-24 DIAGNOSIS — N186 End stage renal disease: Secondary | ICD-10-CM | POA: Diagnosis not present

## 2022-03-24 DIAGNOSIS — I951 Orthostatic hypotension: Secondary | ICD-10-CM | POA: Diagnosis not present

## 2022-03-25 DIAGNOSIS — N186 End stage renal disease: Secondary | ICD-10-CM | POA: Diagnosis not present

## 2022-03-25 DIAGNOSIS — D509 Iron deficiency anemia, unspecified: Secondary | ICD-10-CM | POA: Diagnosis not present

## 2022-03-25 DIAGNOSIS — D631 Anemia in chronic kidney disease: Secondary | ICD-10-CM | POA: Diagnosis not present

## 2022-03-25 DIAGNOSIS — Z992 Dependence on renal dialysis: Secondary | ICD-10-CM | POA: Diagnosis not present

## 2022-03-26 DIAGNOSIS — Z992 Dependence on renal dialysis: Secondary | ICD-10-CM | POA: Diagnosis not present

## 2022-03-26 DIAGNOSIS — D631 Anemia in chronic kidney disease: Secondary | ICD-10-CM | POA: Diagnosis not present

## 2022-03-26 DIAGNOSIS — N186 End stage renal disease: Secondary | ICD-10-CM | POA: Diagnosis not present

## 2022-03-26 DIAGNOSIS — D509 Iron deficiency anemia, unspecified: Secondary | ICD-10-CM | POA: Diagnosis not present

## 2022-03-27 DIAGNOSIS — D509 Iron deficiency anemia, unspecified: Secondary | ICD-10-CM | POA: Diagnosis not present

## 2022-03-27 DIAGNOSIS — Z992 Dependence on renal dialysis: Secondary | ICD-10-CM | POA: Diagnosis not present

## 2022-03-27 DIAGNOSIS — N186 End stage renal disease: Secondary | ICD-10-CM | POA: Diagnosis not present

## 2022-03-27 DIAGNOSIS — D631 Anemia in chronic kidney disease: Secondary | ICD-10-CM | POA: Diagnosis not present

## 2022-03-28 DIAGNOSIS — D509 Iron deficiency anemia, unspecified: Secondary | ICD-10-CM | POA: Diagnosis not present

## 2022-03-28 DIAGNOSIS — Z992 Dependence on renal dialysis: Secondary | ICD-10-CM | POA: Diagnosis not present

## 2022-03-28 DIAGNOSIS — N186 End stage renal disease: Secondary | ICD-10-CM | POA: Diagnosis not present

## 2022-03-28 DIAGNOSIS — D631 Anemia in chronic kidney disease: Secondary | ICD-10-CM | POA: Diagnosis not present

## 2022-03-29 DIAGNOSIS — N186 End stage renal disease: Secondary | ICD-10-CM | POA: Diagnosis not present

## 2022-03-29 DIAGNOSIS — Z992 Dependence on renal dialysis: Secondary | ICD-10-CM | POA: Diagnosis not present

## 2022-03-29 DIAGNOSIS — D631 Anemia in chronic kidney disease: Secondary | ICD-10-CM | POA: Diagnosis not present

## 2022-03-29 DIAGNOSIS — D509 Iron deficiency anemia, unspecified: Secondary | ICD-10-CM | POA: Diagnosis not present

## 2022-03-30 DIAGNOSIS — D509 Iron deficiency anemia, unspecified: Secondary | ICD-10-CM | POA: Diagnosis not present

## 2022-03-30 DIAGNOSIS — N186 End stage renal disease: Secondary | ICD-10-CM | POA: Diagnosis not present

## 2022-03-30 DIAGNOSIS — Z992 Dependence on renal dialysis: Secondary | ICD-10-CM | POA: Diagnosis not present

## 2022-03-30 DIAGNOSIS — D631 Anemia in chronic kidney disease: Secondary | ICD-10-CM | POA: Diagnosis not present

## 2022-03-31 DIAGNOSIS — E1140 Type 2 diabetes mellitus with diabetic neuropathy, unspecified: Secondary | ICD-10-CM | POA: Diagnosis not present

## 2022-03-31 DIAGNOSIS — I120 Hypertensive chronic kidney disease with stage 5 chronic kidney disease or end stage renal disease: Secondary | ICD-10-CM | POA: Diagnosis not present

## 2022-03-31 DIAGNOSIS — N186 End stage renal disease: Secondary | ICD-10-CM | POA: Diagnosis not present

## 2022-03-31 DIAGNOSIS — D509 Iron deficiency anemia, unspecified: Secondary | ICD-10-CM | POA: Diagnosis not present

## 2022-03-31 DIAGNOSIS — E1122 Type 2 diabetes mellitus with diabetic chronic kidney disease: Secondary | ICD-10-CM | POA: Diagnosis not present

## 2022-03-31 DIAGNOSIS — D631 Anemia in chronic kidney disease: Secondary | ICD-10-CM | POA: Diagnosis not present

## 2022-03-31 DIAGNOSIS — Z992 Dependence on renal dialysis: Secondary | ICD-10-CM | POA: Diagnosis not present

## 2022-03-31 DIAGNOSIS — I951 Orthostatic hypotension: Secondary | ICD-10-CM | POA: Diagnosis not present

## 2022-04-01 DIAGNOSIS — Z992 Dependence on renal dialysis: Secondary | ICD-10-CM | POA: Diagnosis not present

## 2022-04-01 DIAGNOSIS — D631 Anemia in chronic kidney disease: Secondary | ICD-10-CM | POA: Diagnosis not present

## 2022-04-01 DIAGNOSIS — D509 Iron deficiency anemia, unspecified: Secondary | ICD-10-CM | POA: Diagnosis not present

## 2022-04-01 DIAGNOSIS — N186 End stage renal disease: Secondary | ICD-10-CM | POA: Diagnosis not present

## 2022-04-02 DIAGNOSIS — D509 Iron deficiency anemia, unspecified: Secondary | ICD-10-CM | POA: Diagnosis not present

## 2022-04-02 DIAGNOSIS — Z992 Dependence on renal dialysis: Secondary | ICD-10-CM | POA: Diagnosis not present

## 2022-04-02 DIAGNOSIS — D631 Anemia in chronic kidney disease: Secondary | ICD-10-CM | POA: Diagnosis not present

## 2022-04-02 DIAGNOSIS — N186 End stage renal disease: Secondary | ICD-10-CM | POA: Diagnosis not present

## 2022-04-03 DIAGNOSIS — D509 Iron deficiency anemia, unspecified: Secondary | ICD-10-CM | POA: Diagnosis not present

## 2022-04-03 DIAGNOSIS — D631 Anemia in chronic kidney disease: Secondary | ICD-10-CM | POA: Diagnosis not present

## 2022-04-03 DIAGNOSIS — Z992 Dependence on renal dialysis: Secondary | ICD-10-CM | POA: Diagnosis not present

## 2022-04-03 DIAGNOSIS — N186 End stage renal disease: Secondary | ICD-10-CM | POA: Diagnosis not present

## 2022-04-04 DIAGNOSIS — N186 End stage renal disease: Secondary | ICD-10-CM | POA: Diagnosis not present

## 2022-04-04 DIAGNOSIS — Z992 Dependence on renal dialysis: Secondary | ICD-10-CM | POA: Diagnosis not present

## 2022-04-04 DIAGNOSIS — E1140 Type 2 diabetes mellitus with diabetic neuropathy, unspecified: Secondary | ICD-10-CM | POA: Diagnosis not present

## 2022-04-04 DIAGNOSIS — D631 Anemia in chronic kidney disease: Secondary | ICD-10-CM | POA: Diagnosis not present

## 2022-04-04 DIAGNOSIS — I120 Hypertensive chronic kidney disease with stage 5 chronic kidney disease or end stage renal disease: Secondary | ICD-10-CM | POA: Diagnosis not present

## 2022-04-04 DIAGNOSIS — D509 Iron deficiency anemia, unspecified: Secondary | ICD-10-CM | POA: Diagnosis not present

## 2022-04-04 DIAGNOSIS — E1122 Type 2 diabetes mellitus with diabetic chronic kidney disease: Secondary | ICD-10-CM | POA: Diagnosis not present

## 2022-04-04 DIAGNOSIS — I951 Orthostatic hypotension: Secondary | ICD-10-CM | POA: Diagnosis not present

## 2022-04-05 DIAGNOSIS — N186 End stage renal disease: Secondary | ICD-10-CM | POA: Diagnosis not present

## 2022-04-05 DIAGNOSIS — D631 Anemia in chronic kidney disease: Secondary | ICD-10-CM | POA: Diagnosis not present

## 2022-04-05 DIAGNOSIS — Z992 Dependence on renal dialysis: Secondary | ICD-10-CM | POA: Diagnosis not present

## 2022-04-05 DIAGNOSIS — D509 Iron deficiency anemia, unspecified: Secondary | ICD-10-CM | POA: Diagnosis not present

## 2022-04-06 ENCOUNTER — Encounter: Payer: Self-pay | Admitting: Oncology

## 2022-04-06 DIAGNOSIS — N186 End stage renal disease: Secondary | ICD-10-CM | POA: Diagnosis not present

## 2022-04-06 DIAGNOSIS — D631 Anemia in chronic kidney disease: Secondary | ICD-10-CM | POA: Diagnosis not present

## 2022-04-06 DIAGNOSIS — Z992 Dependence on renal dialysis: Secondary | ICD-10-CM | POA: Diagnosis not present

## 2022-04-06 DIAGNOSIS — D509 Iron deficiency anemia, unspecified: Secondary | ICD-10-CM | POA: Diagnosis not present

## 2022-04-07 DIAGNOSIS — N186 End stage renal disease: Secondary | ICD-10-CM | POA: Diagnosis not present

## 2022-04-07 DIAGNOSIS — D509 Iron deficiency anemia, unspecified: Secondary | ICD-10-CM | POA: Diagnosis not present

## 2022-04-07 DIAGNOSIS — Z992 Dependence on renal dialysis: Secondary | ICD-10-CM | POA: Diagnosis not present

## 2022-04-07 DIAGNOSIS — D631 Anemia in chronic kidney disease: Secondary | ICD-10-CM | POA: Diagnosis not present

## 2022-04-08 ENCOUNTER — Encounter: Payer: Self-pay | Admitting: Oncology

## 2022-04-08 DIAGNOSIS — D509 Iron deficiency anemia, unspecified: Secondary | ICD-10-CM | POA: Diagnosis not present

## 2022-04-08 DIAGNOSIS — N186 End stage renal disease: Secondary | ICD-10-CM | POA: Diagnosis not present

## 2022-04-08 DIAGNOSIS — D631 Anemia in chronic kidney disease: Secondary | ICD-10-CM | POA: Diagnosis not present

## 2022-04-08 DIAGNOSIS — Z992 Dependence on renal dialysis: Secondary | ICD-10-CM | POA: Diagnosis not present

## 2022-04-09 DIAGNOSIS — D631 Anemia in chronic kidney disease: Secondary | ICD-10-CM | POA: Diagnosis not present

## 2022-04-09 DIAGNOSIS — Z992 Dependence on renal dialysis: Secondary | ICD-10-CM | POA: Diagnosis not present

## 2022-04-09 DIAGNOSIS — N186 End stage renal disease: Secondary | ICD-10-CM | POA: Diagnosis not present

## 2022-04-09 DIAGNOSIS — D509 Iron deficiency anemia, unspecified: Secondary | ICD-10-CM | POA: Diagnosis not present

## 2022-04-10 ENCOUNTER — Emergency Department: Payer: Medicare Other

## 2022-04-10 DIAGNOSIS — E876 Hypokalemia: Secondary | ICD-10-CM | POA: Diagnosis not present

## 2022-04-10 DIAGNOSIS — Z794 Long term (current) use of insulin: Secondary | ICD-10-CM

## 2022-04-10 DIAGNOSIS — N179 Acute kidney failure, unspecified: Secondary | ICD-10-CM | POA: Diagnosis not present

## 2022-04-10 DIAGNOSIS — I25700 Atherosclerosis of coronary artery bypass graft(s), unspecified, with unstable angina pectoris: Secondary | ICD-10-CM | POA: Diagnosis not present

## 2022-04-10 DIAGNOSIS — I2489 Other forms of acute ischemic heart disease: Secondary | ICD-10-CM | POA: Diagnosis present

## 2022-04-10 DIAGNOSIS — I2510 Atherosclerotic heart disease of native coronary artery without angina pectoris: Secondary | ICD-10-CM | POA: Diagnosis present

## 2022-04-10 DIAGNOSIS — Z955 Presence of coronary angioplasty implant and graft: Secondary | ICD-10-CM

## 2022-04-10 DIAGNOSIS — Z992 Dependence on renal dialysis: Secondary | ICD-10-CM | POA: Diagnosis not present

## 2022-04-10 DIAGNOSIS — K219 Gastro-esophageal reflux disease without esophagitis: Secondary | ICD-10-CM | POA: Diagnosis present

## 2022-04-10 DIAGNOSIS — I132 Hypertensive heart and chronic kidney disease with heart failure and with stage 5 chronic kidney disease, or end stage renal disease: Principal | ICD-10-CM | POA: Diagnosis present

## 2022-04-10 DIAGNOSIS — R600 Localized edema: Secondary | ICD-10-CM | POA: Diagnosis not present

## 2022-04-10 DIAGNOSIS — I350 Nonrheumatic aortic (valve) stenosis: Secondary | ICD-10-CM | POA: Diagnosis not present

## 2022-04-10 DIAGNOSIS — E785 Hyperlipidemia, unspecified: Secondary | ICD-10-CM | POA: Diagnosis not present

## 2022-04-10 DIAGNOSIS — I951 Orthostatic hypotension: Secondary | ICD-10-CM | POA: Diagnosis not present

## 2022-04-10 DIAGNOSIS — Z1152 Encounter for screening for COVID-19: Secondary | ICD-10-CM

## 2022-04-10 DIAGNOSIS — Z79899 Other long term (current) drug therapy: Secondary | ICD-10-CM

## 2022-04-10 DIAGNOSIS — I440 Atrioventricular block, first degree: Secondary | ICD-10-CM | POA: Diagnosis present

## 2022-04-10 DIAGNOSIS — N186 End stage renal disease: Secondary | ICD-10-CM | POA: Diagnosis present

## 2022-04-10 DIAGNOSIS — I5023 Acute on chronic systolic (congestive) heart failure: Secondary | ICD-10-CM | POA: Diagnosis present

## 2022-04-10 DIAGNOSIS — E878 Other disorders of electrolyte and fluid balance, not elsewhere classified: Secondary | ICD-10-CM | POA: Diagnosis present

## 2022-04-10 DIAGNOSIS — Z951 Presence of aortocoronary bypass graft: Secondary | ICD-10-CM

## 2022-04-10 DIAGNOSIS — Z87891 Personal history of nicotine dependence: Secondary | ICD-10-CM | POA: Diagnosis not present

## 2022-04-10 DIAGNOSIS — Z7982 Long term (current) use of aspirin: Secondary | ICD-10-CM

## 2022-04-10 DIAGNOSIS — E1122 Type 2 diabetes mellitus with diabetic chronic kidney disease: Secondary | ICD-10-CM | POA: Diagnosis present

## 2022-04-10 DIAGNOSIS — J90 Pleural effusion, not elsewhere classified: Secondary | ICD-10-CM | POA: Diagnosis not present

## 2022-04-10 DIAGNOSIS — R079 Chest pain, unspecified: Secondary | ICD-10-CM | POA: Diagnosis not present

## 2022-04-10 DIAGNOSIS — I959 Hypotension, unspecified: Secondary | ICD-10-CM | POA: Diagnosis not present

## 2022-04-10 DIAGNOSIS — E871 Hypo-osmolality and hyponatremia: Secondary | ICD-10-CM | POA: Diagnosis not present

## 2022-04-10 DIAGNOSIS — D631 Anemia in chronic kidney disease: Secondary | ICD-10-CM | POA: Diagnosis present

## 2022-04-10 DIAGNOSIS — I420 Dilated cardiomyopathy: Secondary | ICD-10-CM | POA: Diagnosis present

## 2022-04-10 DIAGNOSIS — K8020 Calculus of gallbladder without cholecystitis without obstruction: Secondary | ICD-10-CM | POA: Diagnosis not present

## 2022-04-10 DIAGNOSIS — E8770 Fluid overload, unspecified: Secondary | ICD-10-CM | POA: Diagnosis not present

## 2022-04-10 DIAGNOSIS — I255 Ischemic cardiomyopathy: Secondary | ICD-10-CM | POA: Diagnosis present

## 2022-04-10 DIAGNOSIS — I5033 Acute on chronic diastolic (congestive) heart failure: Secondary | ICD-10-CM | POA: Diagnosis not present

## 2022-04-10 DIAGNOSIS — R0602 Shortness of breath: Secondary | ICD-10-CM | POA: Diagnosis not present

## 2022-04-10 DIAGNOSIS — E1142 Type 2 diabetes mellitus with diabetic polyneuropathy: Secondary | ICD-10-CM | POA: Diagnosis not present

## 2022-04-10 DIAGNOSIS — I083 Combined rheumatic disorders of mitral, aortic and tricuspid valves: Secondary | ICD-10-CM | POA: Diagnosis present

## 2022-04-10 DIAGNOSIS — I120 Hypertensive chronic kidney disease with stage 5 chronic kidney disease or end stage renal disease: Secondary | ICD-10-CM | POA: Diagnosis not present

## 2022-04-10 DIAGNOSIS — D509 Iron deficiency anemia, unspecified: Secondary | ICD-10-CM | POA: Diagnosis not present

## 2022-04-10 DIAGNOSIS — Z85828 Personal history of other malignant neoplasm of skin: Secondary | ICD-10-CM

## 2022-04-10 DIAGNOSIS — I509 Heart failure, unspecified: Secondary | ICD-10-CM | POA: Diagnosis not present

## 2022-04-10 DIAGNOSIS — M1990 Unspecified osteoarthritis, unspecified site: Secondary | ICD-10-CM | POA: Diagnosis present

## 2022-04-10 DIAGNOSIS — I110 Hypertensive heart disease with heart failure: Secondary | ICD-10-CM | POA: Diagnosis not present

## 2022-04-10 DIAGNOSIS — E1140 Type 2 diabetes mellitus with diabetic neuropathy, unspecified: Secondary | ICD-10-CM | POA: Diagnosis not present

## 2022-04-10 DIAGNOSIS — N281 Cyst of kidney, acquired: Secondary | ICD-10-CM | POA: Diagnosis not present

## 2022-04-10 DIAGNOSIS — R54 Age-related physical debility: Secondary | ICD-10-CM | POA: Diagnosis present

## 2022-04-10 LAB — BASIC METABOLIC PANEL
Anion gap: 14 (ref 5–15)
BUN: 52 mg/dL — ABNORMAL HIGH (ref 8–23)
CO2: 23 mmol/L (ref 22–32)
Calcium: 7.4 mg/dL — ABNORMAL LOW (ref 8.9–10.3)
Chloride: 91 mmol/L — ABNORMAL LOW (ref 98–111)
Creatinine, Ser: 5.95 mg/dL — ABNORMAL HIGH (ref 0.61–1.24)
GFR, Estimated: 9 mL/min — ABNORMAL LOW (ref >60)
Glucose, Bld: 161 mg/dL — ABNORMAL HIGH (ref 70–99)
Potassium: 3.3 mmol/L — ABNORMAL LOW (ref 3.5–5.1)
Sodium: 128 mmol/L — ABNORMAL LOW (ref 135–145)

## 2022-04-10 LAB — BRAIN NATRIURETIC PEPTIDE: B Natriuretic Peptide: >4500 pg/mL — ABNORMAL HIGH (ref 0.0–100.0)

## 2022-04-10 LAB — RESP PANEL BY RT-PCR (RSV, FLU A&B, COVID)  RVPGX2
Influenza A by PCR: NEGATIVE
Influenza B by PCR: NEGATIVE
Resp Syncytial Virus by PCR: NEGATIVE
SARS Coronavirus 2 by RT PCR: NEGATIVE

## 2022-04-10 LAB — CBC
HCT: 31.3 % — ABNORMAL LOW (ref 39.0–52.0)
Hemoglobin: 10.5 g/dL — ABNORMAL LOW (ref 13.0–17.0)
MCH: 30.5 pg (ref 26.0–34.0)
MCHC: 33.5 g/dL (ref 30.0–36.0)
MCV: 91 fL (ref 80.0–100.0)
Platelets: 256 10*3/uL (ref 150–400)
RBC: 3.44 MIL/uL — ABNORMAL LOW (ref 4.22–5.81)
RDW: 14.8 % (ref 11.5–15.5)
WBC: 6.4 10*3/uL (ref 4.0–10.5)
nRBC: 0 % (ref 0.0–0.2)

## 2022-04-10 LAB — TROPONIN I (HIGH SENSITIVITY): Troponin I (High Sensitivity): 95 ng/L — ABNORMAL HIGH (ref <18)

## 2022-04-10 NOTE — ED Triage Notes (Signed)
Pt presents to the ED dvia POV due to water retention and SOB. Pt does peritoneal dialysis. Pt states he has had more fluid retentions and SOB since Thursday when his MD changed his settings to remove less. Pt c/o N/V/D. +2 Pitting edema bilateral legs. Pt A&Ox4

## 2022-04-11 ENCOUNTER — Inpatient Hospital Stay
Admission: EM | Admit: 2022-04-11 | Discharge: 2022-04-14 | DRG: 291 | Disposition: A | Discharge status: Home / Self Care | Payer: Medicare Other | Attending: Internal Medicine | Admitting: Internal Medicine

## 2022-04-11 ENCOUNTER — Inpatient Hospital Stay: Payer: Medicare Other

## 2022-04-11 ENCOUNTER — Other Ambulatory Visit: Payer: Self-pay

## 2022-04-11 DIAGNOSIS — Z79899 Other long term (current) drug therapy: Secondary | ICD-10-CM | POA: Diagnosis not present

## 2022-04-11 DIAGNOSIS — R600 Localized edema: Secondary | ICD-10-CM | POA: Diagnosis not present

## 2022-04-11 DIAGNOSIS — I959 Hypotension, unspecified: Secondary | ICD-10-CM

## 2022-04-11 DIAGNOSIS — E8770 Fluid overload, unspecified: Secondary | ICD-10-CM | POA: Diagnosis present

## 2022-04-11 DIAGNOSIS — Z87891 Personal history of nicotine dependence: Secondary | ICD-10-CM | POA: Diagnosis not present

## 2022-04-11 DIAGNOSIS — I951 Orthostatic hypotension: Secondary | ICD-10-CM | POA: Diagnosis not present

## 2022-04-11 DIAGNOSIS — E871 Hypo-osmolality and hyponatremia: Secondary | ICD-10-CM | POA: Diagnosis present

## 2022-04-11 DIAGNOSIS — I5023 Acute on chronic systolic (congestive) heart failure: Secondary | ICD-10-CM

## 2022-04-11 DIAGNOSIS — E1140 Type 2 diabetes mellitus with diabetic neuropathy, unspecified: Secondary | ICD-10-CM | POA: Diagnosis not present

## 2022-04-11 DIAGNOSIS — I350 Nonrheumatic aortic (valve) stenosis: Secondary | ICD-10-CM | POA: Diagnosis not present

## 2022-04-11 DIAGNOSIS — I2489 Other forms of acute ischemic heart disease: Secondary | ICD-10-CM | POA: Diagnosis present

## 2022-04-11 DIAGNOSIS — E876 Hypokalemia: Secondary | ICD-10-CM | POA: Diagnosis present

## 2022-04-11 DIAGNOSIS — Z951 Presence of aortocoronary bypass graft: Secondary | ICD-10-CM | POA: Diagnosis not present

## 2022-04-11 DIAGNOSIS — I2510 Atherosclerotic heart disease of native coronary artery without angina pectoris: Secondary | ICD-10-CM | POA: Diagnosis present

## 2022-04-11 DIAGNOSIS — E785 Hyperlipidemia, unspecified: Secondary | ICD-10-CM

## 2022-04-11 DIAGNOSIS — I5033 Acute on chronic diastolic (congestive) heart failure: Secondary | ICD-10-CM | POA: Diagnosis not present

## 2022-04-11 DIAGNOSIS — E1142 Type 2 diabetes mellitus with diabetic polyneuropathy: Secondary | ICD-10-CM | POA: Diagnosis present

## 2022-04-11 DIAGNOSIS — I132 Hypertensive heart and chronic kidney disease with heart failure and with stage 5 chronic kidney disease, or end stage renal disease: Secondary | ICD-10-CM | POA: Diagnosis present

## 2022-04-11 DIAGNOSIS — D631 Anemia in chronic kidney disease: Secondary | ICD-10-CM | POA: Diagnosis present

## 2022-04-11 DIAGNOSIS — J90 Pleural effusion, not elsewhere classified: Secondary | ICD-10-CM | POA: Diagnosis not present

## 2022-04-11 DIAGNOSIS — E878 Other disorders of electrolyte and fluid balance, not elsewhere classified: Secondary | ICD-10-CM | POA: Diagnosis present

## 2022-04-11 DIAGNOSIS — I25700 Atherosclerosis of coronary artery bypass graft(s), unspecified, with unstable angina pectoris: Secondary | ICD-10-CM | POA: Diagnosis not present

## 2022-04-11 DIAGNOSIS — I083 Combined rheumatic disorders of mitral, aortic and tricuspid valves: Secondary | ICD-10-CM | POA: Diagnosis present

## 2022-04-11 DIAGNOSIS — Z794 Long term (current) use of insulin: Secondary | ICD-10-CM | POA: Diagnosis not present

## 2022-04-11 DIAGNOSIS — K8020 Calculus of gallbladder without cholecystitis without obstruction: Secondary | ICD-10-CM | POA: Diagnosis not present

## 2022-04-11 DIAGNOSIS — Z992 Dependence on renal dialysis: Secondary | ICD-10-CM | POA: Diagnosis not present

## 2022-04-11 DIAGNOSIS — R0602 Shortness of breath: Secondary | ICD-10-CM

## 2022-04-11 DIAGNOSIS — N281 Cyst of kidney, acquired: Secondary | ICD-10-CM | POA: Diagnosis not present

## 2022-04-11 DIAGNOSIS — E1122 Type 2 diabetes mellitus with diabetic chronic kidney disease: Secondary | ICD-10-CM | POA: Diagnosis present

## 2022-04-11 DIAGNOSIS — I120 Hypertensive chronic kidney disease with stage 5 chronic kidney disease or end stage renal disease: Secondary | ICD-10-CM | POA: Diagnosis not present

## 2022-04-11 DIAGNOSIS — K219 Gastro-esophageal reflux disease without esophagitis: Secondary | ICD-10-CM | POA: Diagnosis present

## 2022-04-11 DIAGNOSIS — N186 End stage renal disease: Secondary | ICD-10-CM | POA: Diagnosis present

## 2022-04-11 DIAGNOSIS — Z1152 Encounter for screening for COVID-19: Secondary | ICD-10-CM | POA: Diagnosis not present

## 2022-04-11 DIAGNOSIS — D509 Iron deficiency anemia, unspecified: Secondary | ICD-10-CM | POA: Diagnosis not present

## 2022-04-11 DIAGNOSIS — R079 Chest pain, unspecified: Secondary | ICD-10-CM | POA: Diagnosis not present

## 2022-04-11 DIAGNOSIS — I420 Dilated cardiomyopathy: Secondary | ICD-10-CM | POA: Diagnosis present

## 2022-04-11 DIAGNOSIS — Z85828 Personal history of other malignant neoplasm of skin: Secondary | ICD-10-CM | POA: Diagnosis not present

## 2022-04-11 DIAGNOSIS — I255 Ischemic cardiomyopathy: Secondary | ICD-10-CM | POA: Diagnosis present

## 2022-04-11 LAB — BASIC METABOLIC PANEL
Anion gap: 11 (ref 5–15)
BUN: 52 mg/dL — ABNORMAL HIGH (ref 8–23)
CO2: 24 mmol/L (ref 22–32)
Calcium: 7.4 mg/dL — ABNORMAL LOW (ref 8.9–10.3)
Chloride: 94 mmol/L — ABNORMAL LOW (ref 98–111)
Creatinine, Ser: 5.74 mg/dL — ABNORMAL HIGH (ref 0.61–1.24)
GFR, Estimated: 10 mL/min — ABNORMAL LOW (ref >60)
Glucose, Bld: 81 mg/dL (ref 70–99)
Potassium: 3.3 mmol/L — ABNORMAL LOW (ref 3.5–5.1)
Sodium: 129 mmol/L — ABNORMAL LOW (ref 135–145)

## 2022-04-11 LAB — CBC
HCT: 28.3 % — ABNORMAL LOW (ref 39.0–52.0)
Hemoglobin: 9.6 g/dL — ABNORMAL LOW (ref 13.0–17.0)
MCH: 31.1 pg (ref 26.0–34.0)
MCHC: 33.9 g/dL (ref 30.0–36.0)
MCV: 91.6 fL (ref 80.0–100.0)
Platelets: 241 10*3/uL (ref 150–400)
RBC: 3.09 MIL/uL — ABNORMAL LOW (ref 4.22–5.81)
RDW: 14.7 % (ref 11.5–15.5)
WBC: 7.7 10*3/uL (ref 4.0–10.5)
nRBC: 0 % (ref 0.0–0.2)

## 2022-04-11 LAB — HEPATITIS B SURFACE ANTIGEN: Hepatitis B Surface Ag: NONREACTIVE

## 2022-04-11 LAB — ALBUMIN: Albumin: 1.7 g/dL — ABNORMAL LOW (ref 3.5–5.0)

## 2022-04-11 LAB — TROPONIN I (HIGH SENSITIVITY)
Troponin I (High Sensitivity): 108 ng/L (ref <18)
Troponin I (High Sensitivity): 82 ng/L — ABNORMAL HIGH (ref <18)
Troponin I (High Sensitivity): 86 ng/L — ABNORMAL HIGH (ref <18)

## 2022-04-11 MED ORDER — TRAZODONE HCL 50 MG PO TABS
25.0000 mg | ORAL_TABLET | Freq: Every evening | ORAL | Status: DC | PRN
  Administered 2022-04-13: 25 mg via ORAL
  Filled 2022-04-11: qty 1

## 2022-04-11 MED ORDER — OMEGA-3-ACID ETHYL ESTERS 1 G PO CAPS
3.0000 g | ORAL_CAPSULE | Freq: Every day | ORAL | Status: DC
  Administered 2022-04-11 – 2022-04-14 (×4): 3 g via ORAL
  Filled 2022-04-11 (×4): qty 3

## 2022-04-11 MED ORDER — MAGNESIUM HYDROXIDE 400 MG/5ML PO SUSP: 30.0000 mL | Freq: Every day | ORAL | Status: DC | PRN

## 2022-04-11 MED ORDER — INSULIN ASPART 100 UNIT/ML IJ SOLN: 0.0000 [IU] | Freq: Every day | INTRAMUSCULAR | Status: DC

## 2022-04-11 MED ORDER — MIDODRINE HCL 5 MG PO TABS
10.0000 mg | ORAL_TABLET | Freq: Three times a day (TID) | ORAL | Status: DC
  Administered 2022-04-11 – 2022-04-14 (×10): 10 mg via ORAL
  Filled 2022-04-11 (×10): qty 2

## 2022-04-11 MED ORDER — HEPARIN SODIUM (PORCINE) 5000 UNIT/ML IJ SOLN
5000.0000 [IU] | Freq: Three times a day (TID) | INTRAMUSCULAR | Status: DC
  Administered 2022-04-11 – 2022-04-14 (×10): 5000 [IU] via SUBCUTANEOUS
  Filled 2022-04-11 (×10): qty 1

## 2022-04-11 MED ORDER — SODIUM CHLORIDE 0.9 % IV BOLUS
500.0000 mL | Freq: Once | INTRAVENOUS | Status: AC
  Administered 2022-04-11: 500 mL via INTRAVENOUS

## 2022-04-11 MED ORDER — POTASSIUM CHLORIDE CRYS ER 20 MEQ PO TBCR
20.0000 meq | EXTENDED_RELEASE_TABLET | Freq: Once | ORAL | Status: AC
  Administered 2022-04-11: 20 meq via ORAL
  Filled 2022-04-11: qty 1

## 2022-04-11 MED ORDER — INSULIN ASPART 100 UNIT/ML IJ SOLN: 0.0000 [IU] | Freq: Three times a day (TID) | INTRAMUSCULAR | Status: DC

## 2022-04-11 MED ORDER — DELFLEX-LC/1.5% DEXTROSE 344 MOSM/L IP SOLN: INTRAPERITONEAL | Status: DC

## 2022-04-11 MED ORDER — ACETAMINOPHEN 325 MG PO TABS
650.0000 mg | ORAL_TABLET | Freq: Four times a day (QID) | ORAL | Status: DC | PRN
  Administered 2022-04-13 (×2): 650 mg via ORAL
  Filled 2022-04-11 (×2): qty 2

## 2022-04-11 MED ORDER — PANTOPRAZOLE SODIUM 40 MG PO TBEC
40.0000 mg | DELAYED_RELEASE_TABLET | Freq: Two times a day (BID) | ORAL | Status: DC
  Administered 2022-04-11 – 2022-04-14 (×7): 40 mg via ORAL
  Filled 2022-04-11 (×7): qty 1

## 2022-04-11 MED ORDER — EZETIMIBE 10 MG PO TABS
10.0000 mg | ORAL_TABLET | Freq: Every day | ORAL | Status: DC
  Administered 2022-04-11 – 2022-04-14 (×4): 10 mg via ORAL
  Filled 2022-04-11 (×4): qty 1

## 2022-04-11 MED ORDER — GENTAMICIN SULFATE 0.1 % EX CREA
1.0000 | TOPICAL_CREAM | Freq: Every day | CUTANEOUS | Status: DC
  Administered 2022-04-13 – 2022-04-14 (×2): 1 via TOPICAL
  Filled 2022-04-11 (×2): qty 15

## 2022-04-11 MED ORDER — LORATADINE 10 MG PO TABS
10.0000 mg | ORAL_TABLET | Freq: Every day | ORAL | Status: DC
  Administered 2022-04-11 – 2022-04-14 (×4): 10 mg via ORAL
  Filled 2022-04-11 (×4): qty 1

## 2022-04-11 MED ORDER — ONDANSETRON HCL 4 MG PO TABS: 4.0000 mg | ORAL_TABLET | Freq: Four times a day (QID) | ORAL | Status: DC | PRN

## 2022-04-11 MED ORDER — ENOXAPARIN SODIUM 40 MG/0.4ML IJ SOSY: 40.0000 mg | PREFILLED_SYRINGE | INTRAMUSCULAR | Status: DC

## 2022-04-11 MED ORDER — ACETAMINOPHEN 650 MG RE SUPP: 650.0000 mg | Freq: Four times a day (QID) | RECTAL | Status: DC | PRN

## 2022-04-11 MED ORDER — ONDANSETRON HCL 4 MG/2ML IJ SOLN: 4.0000 mg | Freq: Four times a day (QID) | INTRAMUSCULAR | Status: DC | PRN

## 2022-04-11 MED ORDER — ADULT MULTIVITAMIN W/MINERALS CH
1.0000 | ORAL_TABLET | Freq: Every day | ORAL | Status: DC
  Administered 2022-04-11 – 2022-04-12 (×2): 1 via ORAL
  Filled 2022-04-11 (×2): qty 1

## 2022-04-11 MED ORDER — ASPIRIN 81 MG PO TBEC
81.0000 mg | DELAYED_RELEASE_TABLET | Freq: Every day | ORAL | Status: DC
  Administered 2022-04-11 – 2022-04-14 (×4): 81 mg via ORAL
  Filled 2022-04-11 (×4): qty 1

## 2022-04-11 MED ORDER — GABAPENTIN 300 MG PO CAPS
300.0000 mg | ORAL_CAPSULE | Freq: Every day | ORAL | Status: DC
  Administered 2022-04-11 – 2022-04-13 (×4): 300 mg via ORAL
  Filled 2022-04-11 (×4): qty 1

## 2022-04-11 MED ORDER — CALCITRIOL 0.25 MCG PO CAPS
0.2500 ug | ORAL_CAPSULE | Freq: Every day | ORAL | Status: DC
  Administered 2022-04-11 – 2022-04-14 (×4): 0.25 ug via ORAL
  Filled 2022-04-11 (×4): qty 1

## 2022-04-11 MED ORDER — CINACALCET HCL 30 MG PO TABS
30.0000 mg | ORAL_TABLET | Freq: Every day | ORAL | Status: DC
  Administered 2022-04-12 – 2022-04-13 (×2): 30 mg via ORAL
  Filled 2022-04-11 (×3): qty 1

## 2022-04-11 NOTE — Plan of Care (Signed)
  Problem: Education: Goal: Ability to describe self-care measures that may prevent or decrease complications (Diabetes Survival Skills Education) will improve Outcome: Progressing Goal: Individualized Educational Video(s) Outcome: Progressing   Problem: Coping: Goal: Ability to adjust to condition or change in health will improve Outcome: Progressing   

## 2022-04-11 NOTE — Assessment & Plan Note (Signed)
-   We will continue Zetia and omega-3 fish oil.

## 2022-04-11 NOTE — ED Provider Notes (Signed)
[facility] Provider Note    Event Date/Time   First MD Initiated Contact with Patient 04/11/22 0141     (approximate)  History   Chief Complaint: Shortness of Breath  HPI  ABDURRAHMAN Knight is a 71 y.o. male with a past medical history of anemia, diabetes, CKD, hypertension, hyperlipidemia, presents to the emergency department for shortness of breath and increased lower extremity edema.  According to the patient and daughter patient is on peritoneal dialysis, follows up with Dr. Zollie Scale.  Blood pressures have been low and the patient has been retaining fluid.  Dr. Zollie Scale has been helping with the patient's peritoneal dialysis settings however given the worsening shortness of breath with exertion and increased lower extreme edema and low blood pressures he was referred to the emergency department for admission.  Overall the patient appears well, denies any shortness of breath as long as he is lying in bed and sitting upright.  Does state shortness of breath with minimal exertion.  Patient does have 2+ pitting edema to lower extremities blood pressure currently 92/48.  Physical Exam   Triage Vital Signs: ED Triage Vitals  Enc Vitals Group     BP 04/10/22 1434 (!) 84/62     Pulse Rate 04/10/22 1434 (!) 59     Resp 04/10/22 1434 18     Temp 04/10/22 1434 97.6 F (36.4 C)     Temp Source 04/10/22 1434 Oral     SpO2 04/10/22 1434 100 %     Weight 04/10/22 1435 172 lb (78 kg)     Height 04/10/22 1435 '5\' 7"'$  (1.702 m)     Head Circumference --      Peak Flow --      Pain Score 04/10/22 1435 0     Pain Loc --      Pain Edu? --      Excl. in [place]? --     Most recent vital signs: Vitals:   04/10/22 1755 04/10/22 2300  BP: 113/63 (!) 92/48  Pulse: 60 69  Resp: 18 19  Temp: 97.6 F (36.4 C) 98.3 F (36.8 C)  SpO2: 100% 98%    General: Awake, no distress.  CV:  Good peripheral perfusion.  Regular rate and rhythm  Resp:  Normal effort.  Equal breath sounds  bilaterally.  Abd:  No distention.  Soft, nontender.  No rebound or guarding.  Benign abdomen.  Peritoneal dialysis catheter present. Other:  2+ lower extremity pitting edema.   ED Results / Procedures / Treatments   EKG  EKG viewed and interpreted by myself shows what appears to be most consistent with a sinus rhythm with occasional PVCs at 64 bpm with a widened QRS, normal axis nonspecific ST changes without elevation  RADIOLOGY  I reviewed and interpreted the chest x-ray images.  No obvious consolidation seen on my evaluation. Radiology has read bilateral pleural effusions right greater than left with bilateral lower consolidations atelectasis versus infection.   MEDICATIONS ORDERED IN ED: Medications  sodium chloride 0.9 % bolus 500 mL (500 mLs Intravenous New Bag/Given 04/11/22 0110)     IMPRESSION / MDM / ASSESSMENT AND PLAN / ED COURSE  I reviewed the triage vital signs and the nursing notes.  Patient's presentation is most consistent with acute presentation with potential threat to life or bodily function.  Patient presents emergency department for worsening shortness of breath with exertion, worsening kidney function, fluid accumulation and hypotension.  Here the patient appears well, no distress  lying in bed, no increased work of breathing but the patient states shortness of breath with minimal exertion.  Patient's lab work shows a reassuring CBC with a normal white blood cell count, chemistry however shows worsening creatinine with a baseline creatinine around 4.0 currently 5.95, troponin is elevated around 95 likely due to to the patient's renal dysfunction, BNP greater than 4500.  Patient is satting in the mid 90s on room air, he is borderline hypotensive 92/48 we will and gently IV hydrate.  Patient is COVID/flu/RSV is negative.  Given the patient's worsening fluid overload worsening kidney dysfunction now hypotension I believe the patient will require likely hemodialysis.   Will admit to the hospital service for further workup and treatment as well as nephrology consultation.  FINAL CLINICAL IMPRESSION(S) / ED DIAGNOSES   Fluid overload Acute renal failure Hypotension   Note:  This document was prepared using Dragon voice recognition software and may include unintentional dictation errors.   Harvest Dark, MD 04/11/22 [phone]

## 2022-04-11 NOTE — Progress Notes (Signed)
Patient refused CBG checks.

## 2022-04-11 NOTE — Assessment & Plan Note (Addendum)
Most likely due to volume overload.  Patient did receive 500 cc IV fluid in ED. -Sodium with slight improvement to 129. -Fluid restriction with possible diuresis or extra ultrafiltrate -Monitor sodium

## 2022-04-11 NOTE — Assessment & Plan Note (Addendum)
Potassium remained at 3.3 -Replace with 20 mEq-will avoid extra potassium due to ESRD -Continue to monitor

## 2022-04-11 NOTE — Progress Notes (Signed)
Anticoagulation monitoring(Lovenox):  71 yo  male ordered Lovenox 40 mg Q24h    Filed Weights   04/10/22 1435  Weight: 78 kg (172 lb)   BMI    Lab Results  Component Value Date   CREATININE 5.95 (H) 04/10/2022   CREATININE 4.24 (H) 03/14/2022   CREATININE 4.22 (H) 03/13/2022   Estimated Creatinine Clearance: 10.6 mL/min (A) (by C-G formula based on SCr of 5.95 mg/dL (H)). Hemoglobin & Hematocrit     Component Value Date/Time   HGB 10.5 (L) 04/10/2022 2257   HGB 8.2 (L) 04/11/2013 0639   HCT 31.3 (L) 04/10/2022 2257   HCT 24.4 (L) 04/11/2013 1017     Per Protocol for Patient with estCrcl < 15 ml/min and BMI < 30, will transition to Heparin 5000 units SQ Q8H.

## 2022-04-11 NOTE — Assessment & Plan Note (Addendum)
Recent echocardiogram done on 03/14/2022 with EF of 25 to 30% with moderate to severe mitral and tricuspid regurgitation.  Blood pressure soft which is a barrier for extra fluid removal.  Nephrology is trying to adjust dialysis and medication. Markedly elevated BNP with significant lower extremity edema. -Might need some extra pressure support for diuresis. -Cardiology consult -Daily weight and BMP -Strict intake and output

## 2022-04-11 NOTE — Progress Notes (Addendum)
[facility]  ROUNDING NOTE   Subjective:   Roger Knight is a 71 year old male with past medical conditions including GERD, hypertension, dyslipidemia, anemia, osteoarthritis, and end-stage renal disease on peritoneal dialysis.  Patient presents to the emergency department with complaints of increased shortness of breath and lower extremity edema.  Patient has been admitted for SOB (shortness of breath) [R06.02] Fluid overload [E87.70] Hypotension, unspecified hypotension type [I95.9] Acute on chronic HFrEF (heart failure with reduced ejection fraction) ([place]) [I50.23]  Patient is known to our practice and his peritoneal dialysis is followed outpatient at [facility] by Dr. Holley Raring.  Patient reports no recent missed treatments prior to ED arrival.Patient attended PT appointment last Thursday.  Dr. Holley Raring made some adjustments based on hypotension.  Patient stated he began having shortness of breath on Saturday.  Also noticed decrease in urine output.  Lower extremity edema worsened as well.  Patient states he contacted clinic on Monday and was instructed to come to emergency department.  Denies nausea, vomiting, or diarrhea.  States he usually urinates twice a day, now states he has urinated twice since Thursday.  Is not currently prescribed an outpatient diuretic.  Was recently placed on midodrine for blood pressure support.  Labs on ED arrival include sodium 128, potassium 3.3, glucose 161, BUN 52, creatinine 5.95 with GFR 9, calcium 7.4, BNP greater than 4500, troponin 95, and hemoglobin 10.5.  Respiratory panel negative for influenza, COVID-19, and RSV.  We have been consulted to provide peritoneal dialysis during this admission.   Objective:  Vital signs in last 24 hours:  Temp:  [97.6 F (36.4 C)-98.3 F (36.8 C)] 98 F (36.7 C) (12/19 1441) Pulse Rate:  [45-69] 60 (12/19 1441) Resp:  [17-19] 18 (12/19 1441) BP: (83-113)/(44-78) 103/78 (12/19 1441) SpO2:  [97  %-100 %] 97 % (12/19 1441)  Weight change:  Filed Weights   04/10/22 1435  Weight: 78 kg    Intake/Output: No intake/output data recorded.   Intake/Output this shift:  No intake/output data recorded.  Physical Exam: General: NAD,   Head: Normocephalic, atraumatic. Moist oral mucosal membranes  Eyes: Anicteric  Lungs:  Clear to auscultation, normal effort, room air  Heart: Regular rate and rhythm  Abdomen:  Soft, nontender, PD catheter  Extremities: 1+ peripheral edema.  Neurologic: Alert and oriented, moving all four extremities  Skin: No lesions  Access: PD catheter    Basic Metabolic Panel: Recent Labs  Lab 04/10/22 2257 04/11/22 0443  NA 128* 129*  K 3.3* 3.3*  CL 91* 94*  CO2 23 24  GLUCOSE 161* 81  BUN 52* 52*  CREATININE 5.95* 5.74*  CALCIUM 7.4* 7.4*    Liver Function Tests: Recent Labs  Lab 04/11/22 1156  ALBUMIN 1.7*   No results for input(s): "LIPASE", "AMYLASE" in the last 168 hours. No results for input(s): "AMMONIA" in the last 168 hours.  CBC: Recent Labs  Lab 04/10/22 2257 04/11/22 0443  WBC 6.4 7.7  HGB 10.5* 9.6*  HCT 31.3* 28.3*  MCV 91.0 91.6  PLT 256 241    Cardiac Enzymes: No results for input(s): "CKTOTAL", "CKMB", "CKMBINDEX", "TROPONINI" in the last 168 hours.  BNP: Invalid input(s): "POCBNP"  CBG: No results for input(s): "GLUCAP" in the last 168 hours.  Microbiology: Results for orders placed or performed during the hospital encounter of 04/11/22  Resp panel by RT-PCR (RSV, Flu A&B, Covid) Anterior Nasal Swab     Status: None   Collection Time: 04/10/22 11:00 PM   Specimen:  Anterior Nasal Swab  Result Value Ref Range Status   SARS Coronavirus 2 by RT PCR NEGATIVE NEGATIVE Final    Comment: (NOTE) SARS-CoV-2 target nucleic acids are NOT DETECTED.  The SARS-CoV-2 RNA is generally detectable in upper respiratory specimens during the acute phase of infection. The lowest concentration of SARS-CoV-2 viral copies  this assay can detect is 138 copies/mL. A negative result does not preclude SARS-Cov-2 infection and should not be used as the sole basis for treatment or other patient management decisions. A negative result may occur with  improper specimen collection/handling, submission of specimen other than nasopharyngeal swab, presence of viral mutation(s) within the areas targeted by this assay, and inadequate number of viral copies(<138 copies/mL). A negative result must be combined with clinical observations, patient history, and epidemiological information. The expected result is Negative.  Fact Sheet for Patients:  EntrepreneurPulse.com.au  Fact Sheet for Healthcare Providers:  IncredibleEmployment.be  This test is no t yet approved or cleared by the [place] FDA and  has been authorized for detection and/or diagnosis of SARS-CoV-2 by FDA under an Emergency Use Authorization (EUA). This EUA will remain  in effect (meaning this test can be used) for the duration of the COVID-19 declaration under Section 564(b)(1) of the Act, 21 U.S.C.section 360bbb-3(b)(1), unless the authorization is terminated  or revoked sooner.       Influenza A by PCR NEGATIVE NEGATIVE Final   Influenza B by PCR NEGATIVE NEGATIVE Final    Comment: (NOTE) The Xpert Xpress SARS-CoV-2/FLU/RSV plus assay is intended as an aid in the diagnosis of influenza from Nasopharyngeal swab specimens and should not be used as a sole basis for treatment. Nasal washings and aspirates are unacceptable for Xpert Xpress SARS-CoV-2/FLU/RSV testing.  Fact Sheet for Patients: EntrepreneurPulse.com.au  Fact Sheet for Healthcare Providers: IncredibleEmployment.be  This test is not yet approved or cleared by the [place] FDA and has been authorized for detection and/or diagnosis of SARS-CoV-2 by FDA under an Emergency Use Authorization (EUA). This EUA  will remain in effect (meaning this test can be used) for the duration of the COVID-19 declaration under Section 564(b)(1) of the Act, 21 U.S.C. section 360bbb-3(b)(1), unless the authorization is terminated or revoked.     Resp Syncytial Virus by PCR NEGATIVE NEGATIVE Final    Comment: (NOTE) Fact Sheet for Patients: EntrepreneurPulse.com.au  Fact Sheet for Healthcare Providers: IncredibleEmployment.be  This test is not yet approved or cleared by the [place] FDA and has been authorized for detection and/or diagnosis of SARS-CoV-2 by FDA under an Emergency Use Authorization (EUA). This EUA will remain in effect (meaning this test can be used) for the duration of the COVID-19 declaration under Section 564(b)(1) of the Act, 21 U.S.C. section 360bbb-3(b)(1), unless the authorization is terminated or revoked.  Performed at [facility], [address] [place], [place] [address]     Coagulation Studies: No results for input(s): "LABPROT", "INR" in the last 72 hours.  Urinalysis: No results for input(s): "COLORURINE", "LABSPEC", "PHURINE", "GLUCOSEU", "HGBUR", "BILIRUBINUR", "KETONESUR", "PROTEINUR", "UROBILINOGEN", "NITRITE", "LEUKOCYTESUR" in the last 72 hours.  Invalid input(s): "APPERANCEUR"    Imaging: US Abdomen Limited RUQ (LIVER/GB)  Result Date: 04/11/2022 CLINICAL DATA:  Shortness of breath EXAM: ULTRASOUND ABDOMEN LIMITED RIGHT UPPER QUADRANT COMPARISON:  None Available. FINDINGS: Gallbladder: Mobile gallstones in the gallbladder. Gallbladder wall thickening up to 0.6 cm. No pericholecystic fluid. No sonographic Murphy sign noted by sonographer. Common bile duct: Diameter: 0.5 cm Liver: No focal lesion identified. Within normal  limits in parenchymal echogenicity. Portal vein is patent on color Doppler imaging with normal direction of blood flow towards the liver. Other: Incidental note of right pleural effusion. Simple  appearing right renal cyst measuring 8.1 cm, benign, for which no specific further follow-up or characterization is required. IMPRESSION: 1. Mobile gallstones in the gallbladder. Gallbladder wall thickening up to 0.6 cm. No pericholecystic fluid or sonographic Murphy sign. Findings are concerning for acute cholecystitis. Consider further evaluation with nuclear medicine HIDA scan to evaluate for patency of the cystic and common bile ducts. 2. Incidental note of right pleural effusion. Electronically Signed   By: Delanna Ahmadi M.D.   On: 04/11/2022 14:09   DG Chest 2 View  Result Date: 04/10/2022 CLINICAL DATA:  Shortness of breath EXAM: CHEST - 2 VIEW COMPARISON:  Radiograph 03/13/2022 FINDINGS: Unchanged cardiomediastinal silhouette with postsurgical changes of CABG. There are small bilateral pleural effusions, right greater than left. There is a dense right basilar consolidation and patchy left basilar airspace opacities. No evidence of pneumothorax. No acute osseous abnormality. IMPRESSION: Small bilateral pleural effusions, right greater than left, with dense right basilar consolidation and patchy left basilar opacities favoring atelectasis, infection is possible. Electronically Signed   By: Maurine Simmering M.D.   On: 04/10/2022 15:14     Medications:    dialysis solution 1.5% low-MG/low-CA      aspirin EC  81 mg Oral Daily   calcitRIOL  0.25 mcg Oral Daily   cinacalcet  30 mg Oral Q supper   ezetimibe  10 mg Oral Daily   gabapentin  300 mg Oral QHS   gentamicin cream  1 Application Topical Daily   heparin injection (subcutaneous)  5,000 Units Subcutaneous Q8H   insulin aspart  0-5 Units Subcutaneous QHS   insulin aspart  0-9 Units Subcutaneous TID WC   loratadine  10 mg Oral Daily   midodrine  10 mg Oral TID with meals   multivitamin with minerals  1 tablet Oral Daily   omega-3 acid ethyl esters  3 g Oral Daily   pantoprazole  40 mg Oral BID AC   acetaminophen **OR** acetaminophen,  magnesium hydroxide, ondansetron **OR** ondansetron (ZOFRAN) IV, traZODone  Assessment/ Plan:  Mr. KYCE GING is a 71 y.o.  male with past medical conditions including GERD, hypertension, dyslipidemia, anemia, osteoarthritis, and end-stage renal disease on peritoneal dialysis.  Patient presents to the emergency department with complaints of increased shortness of breath and lower extremity edema.  Patient has been admitted for SOB (shortness of breath) [R06.02] Fluid overload [E87.70] Hypotension, unspecified hypotension type [I95.9] Acute on chronic HFrEF (heart failure with reduced ejection fraction) (HCC) [I50.23]   End-stage renal disease on peritoneal dialysis.  Patient with significant lower extremity edema up to upper thighs.  Denies missing recent treatments.  Volume removal is limited due to hypotension.  Will perform peritoneal dialysis tonight with 1.5% dialysate.  Continue midodrine for blood pressure support.  Due to hypotension, would also avoid diuretics at this time.  2. Anemia of chronic kidney disease Lab Results  Component Value Date   HGB 9.6 (L) 04/11/2022  Hemoglobin acceptable.  Will continue to monitor.  3.  Hypotension, etiology unclear.  Will avoid increased UF for use of diuretics.  Continue midodrine  4.  Acute on chronic systolic heart failure.  Echo from 03/14/2022 shows EF 25 to 30% with a severe mitral and moderate tricuspid valve regurgitation.  Cardiology consulted.   LOS: 0 Shantelle Breeze 12/19/20233:31 PM  Patient was  examined and evaluated with Colon Flattery, NP.  Plan of care was formulated and discussed with patient as well as NP.  I agree with the note as documented with edits.

## 2022-04-11 NOTE — ED Notes (Signed)
Delayed on administering Calcitriol due to waiting for it to arrive from [facility]

## 2022-04-11 NOTE — Assessment & Plan Note (Signed)
Blood pressure remained soft. -Continue with midodrine

## 2022-04-11 NOTE — Discharge Planning (Signed)
ESTABLISHED PERITONEAL DIALYSIS PATIENT: Outpatient Facility [facility]  [place], [place] [address] [phone]  Clinic is aware of admission

## 2022-04-11 NOTE — Assessment & Plan Note (Signed)
Patient is on peritoneal dialysis.  Nephrology is on board. -Continue with peritoneal dialysis-as advised by nephrology

## 2022-04-11 NOTE — Progress Notes (Signed)
Progress Note   Patient: Roger Knight NKN:[id number] DOB: June 12, 1950 DOA: 04/11/2022     0 DOS: the patient was seen and examined on 04/11/2022   Brief hospital course: Taken from H&P.  BIRL LOBELLO is a 71 y.o. Caucasian male with medical history significant for osteoarthritis, anemia, end-stage renal disease on peritoneal dialysis, GERD, hypertension, HFrEF(20 to 25%) and dyslipidemia, who presented to the ER with acute onset of worsening dyspnea with associated dyspnea on exertion and lower extremity edema without paroxysmal nocturnal dyspnea or orthopnea.  He denied any fever or chills.  No cough or wheezing.  No chest pain or palpitations.   ED Course: When he came to the ER BP was 84/62 and later 113/63 with heart rate of 59 and later 60.  Labs revealed hyponatremia and hypochloremia with hypokalemia and blood glucose of 161 BUN of 52 and creatinine 5.95.  BNP was more than 4500 and high sensitive troponin I was 95 and later 108. EKG as reviewed by me : EKG showed sinus rhythm with a rate of 60 with frequent PVCs in a pattern of bigeminy with nothing specific interventricular conduction delay and Imaging: Two-view chest x-ray showed the following: Small bilateral pleural effusions, right greater than left, with dense right basilar consolidation and patchy left basilar opacities favoring atelectasis, infection is possible. Nephrology was consulted.  12/19: Blood pressure remained soft at 88/59, sodium 129, potassium 3.3, creatinine 5.74.  COVID-19, influenza and RSV negative.  Significant lower extremity edema.  Urinary output has been decreased significantly recently.  Softer blood pressure is a barrier for more fluid removal. Nephrology is trying to adjust dialysis and his medications, they are recommending to involve cardiology.  Patient follow-up with [facility] cardiology- [name] notified. Patient might need some extra pressure support for effective  diuresis.       Assessment and Plan: * Acute on chronic HFrEF (heart failure with reduced ejection fraction) (HCC) Recent echocardiogram done on 03/14/2022 with EF of 25 to 30% with moderate to severe mitral and tricuspid regurgitation.  Blood pressure soft which is a barrier for extra fluid removal.  Nephrology is trying to adjust dialysis and medication. Markedly elevated BNP with significant lower extremity edema. -Might need some extra pressure support for diuresis. -Cardiology consult -Daily weight and BMP -Strict intake and output  Hypokalemia Potassium remained at 3.3 -Replace with 20 mEq-will avoid extra potassium due to ESRD -Continue to monitor  Hyponatremia Most likely due to volume overload.  Patient did receive 500 cc IV fluid in ED. -Sodium with slight improvement to 129. -Fluid restriction with possible diuresis or extra ultrafiltrate -Monitor sodium  ESRD on peritoneal dialysis [facility]) Patient is on peritoneal dialysis.  Nephrology is on board. -Continue with peritoneal dialysis-as advised by nephrology  Hypotension Blood pressure remained soft. -Continue with midodrine  Type 2 diabetes mellitus with peripheral neuropathy (HCC) Recent A1c of 6.6.  CBG currently within goal. -Sensitive SSI  Dyslipidemia - We will continue Zetia and omega-3 fish oil.  Coronary artery disease Mildly elevated troponin which peaked at 108 with downward trend, most likely secondary to demand supply mismatch with fluid overload.  No chest pain - We will continue aspirin.   Subjective: Patient was seen and examined with nephrology today.  No shortness of breath.  Per patient he was compliant with his midodrine and peritoneal dialysis.  Physical Exam: Vitals:   04/11/22 0900 04/11/22 1000 04/11/22 1028 04/11/22 1441  BP: '95/66 92/62 92/62 '$ 103/78  Pulse: (!) 58 (!) 58 Marland Kitchen)  58 60  Resp:   18 18  Temp:   97.7 F (36.5 C) 98 F (36.7 C)  TempSrc:   Oral   SpO2:   99% 97%   Weight:      Height:       General.  Frail elderly man, in no acute distress. Pulmonary.  Lungs clear bilaterally, normal respiratory effort. CV.  Regular rate and rhythm, no JVD, rub or murmur. Abdomen.  Soft, nontender, nondistended, BS positive. CNS.  Alert and oriented .  No focal neurologic deficit. Extremities.  3+ LE edema, no cyanosis, pulses intact and symmetrical. Psychiatry.  Judgment and insight appears normal.   Data Reviewed: Prior data reviewed  Family Communication: Discussed with wife at bedside  Disposition: Status is: Inpatient Remains inpatient appropriate because: Severity of illness  Planned Discharge Destination: Home  DVT prophylaxis.  Subcu heparin Time spent:  minutes  This record has been created using Systems analyst. Errors have been sought and corrected,but may not always be located. Such creation errors do not reflect on the standard of care.   Author: Lorella Nimrod, MD 04/11/2022 2:52 PM  For on call review [email or web address].

## 2022-04-11 NOTE — H&P (Signed)
[place]   PATIENT NAME: Roger Knight    MR#:  [id number]  DATE OF BIRTH:  12-27-1950  DATE OF ADMISSION:  04/11/2022  PRIMARY CARE PHYSICIAN: Leonel Ramsay, MD   Patient is coming from: Home  REQUESTING/REFERRING PHYSICIAN: Harvest Dark, MD  CHIEF COMPLAINT:   Chief Complaint  Patient presents with   Shortness of Breath    HISTORY OF PRESENT ILLNESS:  Roger Knight is a 71 y.o. Caucasian male with medical history significant for osteoarthritis, anemia, end-stage renal disease on peritoneal dialysis, GERD, hypertension and dyslipidemia, who presented to the ER with acute onset of worsening dyspnea with associated dyspnea on exertion and lower extremity edema without paroxysmal nocturnal dyspnea or orthopnea.  He denied any fever or chills.  No cough or wheezing.  No chest pain or palpitations.  No nausea or vomiting or abdominal pain.  No bleeding diathesis.  ED Course: When he came to the ER BP was 84/62 and later 113/63 with heart rate of 59 and later 60.  Labs revealed hyponatremia and hypochloremia with hypokalemia and blood glucose of 161 BUN of 52 and creatinine 5.95.  BNP was more than 4500 and high sensitive troponin I was 95 and later 108. EKG as reviewed by me : EKG showed sinus rhythm with a rate of 60 with frequent PVCs in a pattern of bigeminy with nothing specific interventricular conduction delay and Imaging: Two-view chest x-ray showed the following: Small bilateral pleural effusions, right greater than left, with dense right basilar consolidation and patchy left basilar opacities favoring atelectasis, infection is possible.  The patient was given 500 mill IV normal saline bolus.  He will be admitted to a medical telemetry bed for further evaluation and management.  PAST MEDICAL HISTORY:   Past Medical History:  Diagnosis Date   Accelerating angina ([place]) 02/11/2014   Anemia    Arthritis    Carcinoma ([place]) 2012    skin cancer  on neck [facility]   Chronic kidney disease 2016   Diabetes mellitus without complication ([place])    GERD (gastroesophageal reflux disease)    Hemorrhoids 2016-17   Hernia    Hyperlipidemia    Hypertension    Skin cancer     PAST SURGICAL HISTORY:   Past Surgical History:  Procedure Laterality Date   COLONOSCOPY  April 2014   COLONOSCOPY N/A 05/13/2015   Procedure: COLONOSCOPY;  Surgeon: Josefine Class, MD;  Location[facility] ENDOSCOPY;  Service: Endoscopy;  Laterality: N/A;   COLONOSCOPY WITH PROPOFOL N/A 05/04/2021   Procedure: COLONOSCOPY WITH PROPOFOL;  Surgeon: Robert Bellow, MD;  Location: [facility] ENDOSCOPY;  Service: Endoscopy;  Laterality: N/A;  DM   CORONARY ARTERY BYPASS GRAFT  1996   [facility]   CORONARY STENT PLACEMENT  2011   [facility]   ESOPHAGOGASTRODUODENOSCOPY (EGD) WITH PROPOFOL N/A 05/13/2015   Procedure: ESOPHAGOGASTRODUODENOSCOPY (EGD) WITH PROPOFOL;  Surgeon: Josefine Class, MD;  Location: [facility] ENDOSCOPY;  Service: Endoscopy;  Laterality: N/A;   LEFT HEART CATH AND CORS/GRAFTS ANGIOGRAPHY N/A 01/24/2021   Procedure: LEFT HEART CATH AND CORS/GRAFTS ANGIOGRAPHY;  Surgeon: Andrez Grime, MD;  Location: [place] CV LAB;  Service: Cardiovascular;  Laterality: N/A;   UPPER GI ENDOSCOPY  2014    SOCIAL HISTORY:   Social History   Tobacco Use   Smoking status: Former    Types: Cigarettes    Quit date: 04/24/2005    Years since quitting: 16.9   Smokeless tobacco: Former  Types: Chew  Substance Use Topics   Alcohol use: No    FAMILY HISTORY:   Family History  Problem Relation Age of Onset   Dementia Mother 73   Osteoarthritis Mother    Cancer Mother     DRUG ALLERGIES:  No Known Allergies  REVIEW OF SYSTEMS:   ROS As per history of present illness. All pertinent systems were reviewed above. Constitutional, HEENT, cardiovascular, respiratory, GI, GU, musculoskeletal, neuro, psychiatric, endocrine, integumentary and hematologic systems were reviewed  and are otherwise negative/unremarkable except for positive findings mentioned above in the HPI.   MEDICATIONS AT HOME:   Prior to Admission medications   Medication Sig Start Date End Date Taking? Authorizing Provider  aspirin 81 MG tablet Take 81 mg by mouth daily.   Yes [provider]  calcitRIOL (ROCALTROL) 0.25 MCG capsule Take 0.25 mcg by mouth daily.  11/08/16  Yes [provider]  Calcium Polycarbophil (FIBER) 625 MG TABS Take 2 capsules by mouth in the morning and at bedtime.   Yes [provider]  cetirizine (ZYRTEC) 10 MG tablet Take 10 mg by mouth daily.   Yes [provider]  cinacalcet (SENSIPAR) 30 MG tablet Take 30 mg by mouth daily with supper.   Yes [provider]  ezetimibe (ZETIA) 10 MG tablet Take 10 mg by mouth daily.   Yes [provider]  gabapentin (NEURONTIN) 300 MG capsule Take 300 mg by mouth at bedtime. 06/28/16  Yes [provider]  midodrine (PROAMATINE) 10 MG tablet Take 10 mg by mouth 3 (three) times daily. 03/07/22 03/07/23 Yes [provider]  Multiple Vitamins-Minerals (MULTIVITAMIN WITH MINERALS) tablet Take 1 tablet by mouth daily.   Yes [provider]  omega-3 fish oil (MAXEPA) 1000 MG CAPS capsule Take 3 capsules by mouth daily.   Yes [provider]  pantoprazole (PROTONIX) 40 MG tablet Take 40 mg by mouth 2 (two) times daily before a meal.    Yes [provider]  insulin aspart (NOVOLOG) 100 UNIT/ML injection Inject 20 Units into the skin 3 (three) times daily.  Patient not taking: Reported on 04/11/2022    [provider]  Insulin Pen Needle 32G X 4 MM MISC USE WITH INJECTIONS 4 TIMES DAILY Patient not taking: Reported on 04/11/2022 10/18/15   [provider]  nitroGLYCERIN (NITROSTAT) 0.4 MG SL tablet Place 0.4 mg under the tongue every 5 (five) minutes as needed for chest pain. Patient not taking: Reported on 04/11/2022    [provider]  sildenafil (VIAGRA) 100 MG tablet Take 100 mg by mouth as needed for erectile dysfunction. Reported on 06/14/2015 Patient not taking: Reported on 04/11/2022    [provider]  TRESIBA FLEXTOUCH 200 UNIT/ML SOPN Inject 90 Units into the skin at bedtime. Patient not taking: Reported on 04/11/2022 02/22/18   [provider]  triamcinolone cream (KENALOG) 0.1 % Apply 1 application topically 2 (two) times daily as needed (irritation). Patient not taking: Reported on 04/11/2022    [provider]      VITAL SIGNS:  Blood pressure (!) 83/44, pulse (!) 56, temperature 97.7 F (36.5 C), temperature source Oral, resp. rate 17, height '5\' 7"'$  (1.702 m), weight 78 kg, SpO2 99 %.  PHYSICAL EXAMINATION:  Physical Exam  GENERAL:  71 y.o.-year-old Caucasian male patient lying in the bed with no acute distress.  EYES: Pupils equal, round, reactive to light and accommodation. No scleral icterus. Extraocular muscles intact.  HEENT: Head atraumatic, normocephalic.  Oropharynx and nasopharynx clear.  NECK:  Supple, no jugular venous distention. No thyroid enlargement, no tenderness.  LUNGS: Slightly diminished bibasilar sounds. No use of accessory muscles of respiration.  CARDIOVASCULAR: Regular rate and rhythm, S1, S2 normal.  2/6 systolic murmur at the left lower sternal border with no, rubs, or gallops.  ABDOMEN: Soft, nondistended, nontender. Bowel sounds present. No organomegaly or mass.  EXTREMITIES: 2+ bilateral lower extremity pitting edema with no clubbing or cyanosis. NEUROLOGIC: Cranial nerves II through XII are intact. Muscle strength 5/5 in all extremities. Sensation intact. Gait not checked.  PSYCHIATRIC: The patient is alert and oriented x 3.  Normal affect and good eye contact. SKIN: No obvious rash, lesion, or ulcer.   LABORATORY PANEL:   CBC Recent Labs  Lab 04/11/22 0443  WBC 7.7  HGB 9.6*  HCT 28.3*  PLT 241    ------------------------------------------------------------------------------------------------------------------  Chemistries  Recent Labs  Lab 04/11/22 0443  NA 129*  K 3.3*  CL 94*  CO2 24  GLUCOSE 81  BUN 52*  CREATININE 5.74*  CALCIUM 7.4*   ------------------------------------------------------------------------------------------------------------------  Cardiac Enzymes No results for input(s): "TROPONINI" in the last 168 hours. ------------------------------------------------------------------------------------------------------------------  RADIOLOGY:  DG Chest 2 View  Result Date: 04/10/2022 CLINICAL DATA:  Shortness of breath EXAM: CHEST - 2 VIEW COMPARISON:  Radiograph 03/13/2022 FINDINGS: Unchanged cardiomediastinal silhouette with postsurgical changes of CABG. There are small bilateral pleural effusions, right greater than left. There is a dense right basilar consolidation and patchy left basilar airspace opacities. No evidence of pneumothorax. No acute osseous abnormality. IMPRESSION: Small bilateral pleural effusions, right greater than left, with dense right basilar consolidation and patchy left basilar opacities favoring atelectasis, infection is possible. Electronically Signed   By: Maurine Simmering M.D.   On: 04/10/2022 15:14      IMPRESSION AND PLAN:  Assessment and Plan: * Fluid overload - This is associated with end-stage renal disease on peritoneal dialysis. - The patient may need to be switched to hemodialysis. - She will be admitted to a medical telemetry bed. - Nephrology consult will be obtained. - I notified Dr. Candiss Norse about the patient. - We will place him on IV Lasix for now. - We will continue calcitriol and Sensipar.  Hypokalemia Potassium will be replaced.  Hyponatremia - This could be related to volume depletion and dehydration. - He received 500 mill IV normal saline bolus and will defer further hydration to our nephrologist.  Type 2  diabetes mellitus with peripheral neuropathy ([place]) - We will continue him on Neurontin and place him on supplement coverage with NovoLog. - We will continue his basal coverage.  Dyslipidemia - We will continue Zetia and omega-3 fish oil.  Coronary artery disease - We will continue aspirin.     DVT prophylaxis: SQ heparin Advanced Care Planning:  Code Status: full code. Family Communication:  The plan of care was discussed in details with the patient (and family). I answered all questions. The patient agreed to proceed with the above mentioned plan. Further management will depend upon hospital course. Disposition Plan: Back to previous home environment Consults called: Nephrology All the records are reviewed and case discussed with ED provider.  Status is: Inpatient    At the time of the admission, it appears that the appropriate admission status for this patient is inpatient.  This is judged to be reasonable and necessary in order to provide the required intensity of service to ensure the patient's safety given the presenting symptoms, physical exam findings and initial  radiographic and laboratory data in the context of comorbid conditions.  The patient requires inpatient status due to high intensity of service, high risk of further deterioration and high frequency of surveillance required.  I certify that at the time of admission, it is my clinical judgment that the patient will require inpatient hospital care extending more than 2 midnights.                            Dispo: The patient is from: Home              Anticipated d/c is to: Home              Patient currently is not medically stable to d/c.              Difficult to place patient: No  Roger Knight M.D on 04/11/2022 at 6:21 AM  [facility]   From 7 PM-7 AM, contact night-coverage [email or web address]  CC: Primary care physician; Leonel Ramsay, MD

## 2022-04-11 NOTE — Hospital Course (Addendum)
Taken from H&P.  Roger Knight is a 71 y.o. Caucasian male with medical history significant for osteoarthritis, anemia, end-stage renal disease on peritoneal dialysis, GERD, hypertension, HFrEF(20 to 25%) and dyslipidemia, who presented to the ER with acute onset of worsening dyspnea with associated dyspnea on exertion and lower extremity edema without paroxysmal nocturnal dyspnea or orthopnea.  He denied any fever or chills.  No cough or wheezing.  No chest pain or palpitations.   ED Course: When he came to the ER BP was 84/62 and later 113/63 with heart rate of 59 and later 60.  Labs revealed hyponatremia and hypochloremia with hypokalemia and blood glucose of 161 BUN of 52 and creatinine 5.95.  BNP was more than 4500 and high sensitive troponin I was 95 and later 108. EKG as reviewed by me : EKG showed sinus rhythm with a rate of 60 with frequent PVCs in a pattern of bigeminy with nothing specific interventricular conduction delay and Imaging: Two-view chest x-ray showed the following: Small bilateral pleural effusions, right greater than left, with dense right basilar consolidation and patchy left basilar opacities favoring atelectasis, infection is possible. Nephrology was consulted.  12/19: Blood pressure remained soft at 88/59, sodium 129, potassium 3.3, creatinine 5.74.  COVID-19, influenza and RSV negative.  Significant lower extremity edema.  Urinary output has been decreased significantly recently.  Softer blood pressure is a barrier for more fluid removal. Nephrology is trying to adjust dialysis and his medications, they are recommending to involve cardiology.  Patient follow-up with [facility] cardiology- [name] notified. Patient might need some extra pressure support for effective diuresis.  12/20: Vital stable.  No urinary output recorded but per patient he had a large urinary output this morning.  He does not urinate very frequently.  No shortness of breath.  Lower  extremity edema seems little improved than before.  Repeat limited echocardiogram with EF of less than 20% and severely dilated cardiomegaly-pending cardiology consult.  12/21: Blood pressure remained soft.  No urinary output recorded.   Labs with borderline leukocytosis at 10.6, hemoglobin stable at 9.8, sodium stable at 129 and renal function consistent with ESRD.  Cardiology saw the patient who has worsening of HFrEF with EF of 20% and moderate to severe aortic stenosis.  Per cardiology note he has an appointment with [name] at [facility] advanced heart failure on January 4th for consideration of advanced therapies. Would also recommend consideration of TAVR at that time. -Low blood pressures preclude the addition of GDMT unfortunately, he was recently taken off of spironolactone and has required midodrine to support his blood pressure during dialysis. Cardiology does not think that the addition of any inotrope is needed at this time and they are recommending follow-up with advanced heart failure clinic at [facility] for further recommendations. Nephrology would like to try draining more fluid with another session of dialysis. Also developed mild erythema, edema and hyperthermia of right upper forearm, had a dog scratched in that area, start him on doxycycline.  12/22: Hemodynamically stable.  Slight worsening of sodium to 128.  Patient would like to go home.  Nephrology provided him with detailed instructions about his peritoneal dialysis and they will follow-up closely as an outpatient.  Cardiology will also follow-up and he has been given an appointment at advanced heart failure clinic.  Remained volume overload. He is also been started on Lasix to see if that will help.  Per pharmacy record he was not using any of his insulin listed in his  chart so it was discontinued.  If he continued to need diabetes management he need to follow-up with primary care provider.  He will continue on current  medications and need to have a close follow-up with his providers for further recommendations.  Patient is high risk for deterioration and mortality based on underlying comorbidities and worsening heart failure.

## 2022-04-11 NOTE — Assessment & Plan Note (Addendum)
Recent A1c of 6.6.  CBG currently within goal. -Sensitive SSI

## 2022-04-11 NOTE — Assessment & Plan Note (Addendum)
Mildly elevated troponin which peaked at 108 with downward trend, most likely secondary to demand supply mismatch with fluid overload.  No chest pain - We will continue aspirin.

## 2022-04-11 NOTE — Progress Notes (Signed)
Pt refusing CBG checks, pt states his doctor took him off insulin 6 months ago.

## 2022-04-12 ENCOUNTER — Other Ambulatory Visit: Payer: Self-pay

## 2022-04-12 ENCOUNTER — Inpatient Hospital Stay
Admit: 2022-04-12 | Discharge: 2022-04-12 | Disposition: A | Discharge status: Home / Self Care | Payer: Medicare Other | Attending: Cardiology | Admitting: Cardiology

## 2022-04-12 DIAGNOSIS — E1140 Type 2 diabetes mellitus with diabetic neuropathy, unspecified: Secondary | ICD-10-CM | POA: Diagnosis not present

## 2022-04-12 DIAGNOSIS — D631 Anemia in chronic kidney disease: Secondary | ICD-10-CM | POA: Diagnosis not present

## 2022-04-12 DIAGNOSIS — I951 Orthostatic hypotension: Secondary | ICD-10-CM | POA: Diagnosis not present

## 2022-04-12 DIAGNOSIS — E1122 Type 2 diabetes mellitus with diabetic chronic kidney disease: Secondary | ICD-10-CM | POA: Diagnosis not present

## 2022-04-12 DIAGNOSIS — I5023 Acute on chronic systolic (congestive) heart failure: Secondary | ICD-10-CM | POA: Diagnosis not present

## 2022-04-12 DIAGNOSIS — I120 Hypertensive chronic kidney disease with stage 5 chronic kidney disease or end stage renal disease: Secondary | ICD-10-CM | POA: Diagnosis not present

## 2022-04-12 DIAGNOSIS — E871 Hypo-osmolality and hyponatremia: Secondary | ICD-10-CM | POA: Diagnosis not present

## 2022-04-12 DIAGNOSIS — E876 Hypokalemia: Secondary | ICD-10-CM | POA: Diagnosis not present

## 2022-04-12 DIAGNOSIS — I2510 Atherosclerotic heart disease of native coronary artery without angina pectoris: Secondary | ICD-10-CM | POA: Diagnosis not present

## 2022-04-12 DIAGNOSIS — N186 End stage renal disease: Secondary | ICD-10-CM | POA: Diagnosis not present

## 2022-04-12 LAB — ECHOCARDIOGRAM LIMITED
AR max vel: 0.63 cm2
AV Area VTI: 0.56 cm2
AV Area mean vel: 0.59 cm2
AV Mean grad: 36 mmHg
AV Peak grad: 61.5 mmHg
Ao pk vel: 3.92 m/s
S' Lateral: 6.5 cm
Single Plane A4C EF: 25.8 %

## 2022-04-12 MED ORDER — RENA-VITE PO TABS
1.0000 | ORAL_TABLET | Freq: Every day | ORAL | Status: DC
  Administered 2022-04-12 – 2022-04-13 (×2): 1 via ORAL
  Filled 2022-04-12 (×2): qty 1

## 2022-04-12 NOTE — Consult Note (Signed)
[place] NOTE       Patient ID: ADEOLUWA SILVERS MRN: [id number] DOB/AGE: 25-Apr-1950 71 y.o.  Admit date: 04/11/2022 Referring Physician Dr. Lorella Nimrod Primary Physician Dr. Ellison Hughs Primary Cardiologist Dr. Ubaldo Glassing Reason for Consultation AoCHF  HPI: Rebeca Alert. Dhillon is a 71yoM with a PMH of CAD s/p CABG (1996) with underlying severe 3v CAD, moderate AS, mod-sev MR, HFrEF (25-30%, G2 DD, global hypo-02/2022), ESRD on PD, DM2 who presented to [facility] ED 04/11/2022 with worsening dyspnea on exertion and decreased UOP.  Cardiology is consulted for assistance with his heart failure.  The patient presents with his wife at the bedside and daughter present by phone who contribute to the history.  He has a longstanding history of ischemic cardiomyopathy, s/p CABG in 1996, and has been on peritoneal dialysis since 2021 secondary to ESRD.  He had a recent hospitalization in November 2023 following a syncopal episode that was felt to be secondary to orthostatic hypotension and issues with the dialysate he was using at the time. He was taken off of spironolactone and atorvastatin and was continued on midodrine 10 mg 3 times daily.  Blood pressure had been too low to tolerate any GDMT.  He followed up with his regular cardiologist Dr. Ubaldo Glassing on 11/29 where he was referred to Dr. Posey Pronto at [facility] for advanced heart failure, but has not had this appointment yet.  He was set up to have PD with the new dialysate in hopes that this would not cause so much hypotension, but the patient was unfortunately having worsening peripheral edema, shortness of breath primarily when bending over, and fluid retention in the setting of decreased urine output.  He used to urinate at least twice a day, but did not make any urine for the past 6 days, until this morning when he "urinated a bunch."  At my time of evaluation this morning he is feeling okay, denies chest pain or shortness of breath at rest.  He  thinks his peripheral edema is "less tight" primarily in his lower legs, but it is still present.  He says he has now missed 2 dialysis treatments but is getting set up for 1 at the bedside this morning.  Vitals are notable for hypotension with systolic blood pressure ranging between 88-1 04, with current BP 99/75.  He is comfortable on room air.  Heart rate in the 60s in sinus rhythm with 1* AV block on telemetry.  This potassium was 3.3, his chronic hyponatremia with sodium 129, BUN/creatinine 52/5.74, GFR of 10.  BNP is >4500, high-sensitivity troponin elevated with a flat trend at 95, 108, 82, 86.  Chest x-ray with small bilateral pleural effusions R > L.   Review of systems complete and found to be negative unless listed above     Past Medical History:  Diagnosis Date   Accelerating angina ([place]) 02/11/2014   Anemia    Arthritis    Carcinoma ([place]) 2012    skin cancer on neck [facility]   Chronic kidney disease 2016   Diabetes mellitus without complication (HCC)    GERD (gastroesophageal reflux disease)    Hemorrhoids 2016-17   Hernia    Hyperlipidemia    Hypertension    Skin cancer     Past Surgical History:  Procedure Laterality Date   COLONOSCOPY  April 2014   COLONOSCOPY N/A 05/13/2015   Procedure: COLONOSCOPY;  Surgeon: Josefine Class, MD;  Location: [facility] ENDOSCOPY;  Service: Endoscopy;  Laterality: N/A;   COLONOSCOPY  WITH PROPOFOL N/A 05/04/2021   Procedure: COLONOSCOPY WITH PROPOFOL;  Surgeon: Robert Bellow, MD;  Location: [facility] ENDOSCOPY;  Service: Endoscopy;  Laterality: N/A;  DM   CORONARY ARTERY BYPASS GRAFT  1996   [facility]   CORONARY STENT PLACEMENT  2011   [facility]   ESOPHAGOGASTRODUODENOSCOPY (EGD) WITH PROPOFOL N/A 05/13/2015   Procedure: ESOPHAGOGASTRODUODENOSCOPY (EGD) WITH PROPOFOL;  Surgeon: Josefine Class, MD;  Location: [facility] ENDOSCOPY;  Service: Endoscopy;  Laterality: N/A;   LEFT HEART CATH AND CORS/GRAFTS ANGIOGRAPHY N/A 01/24/2021   Procedure: LEFT HEART  CATH AND CORS/GRAFTS ANGIOGRAPHY;  Surgeon: Andrez Grime, MD;  Location: [place] CV LAB;  Service: Cardiovascular;  Laterality: N/A;   UPPER GI ENDOSCOPY  2014    Medications Prior to Admission  Medication Sig Dispense Refill Last Dose   aspirin 81 MG tablet Take 81 mg by mouth daily.   Past Week   calcitRIOL (ROCALTROL) 0.25 MCG capsule Take 0.25 mcg by mouth daily.    Past Week   Calcium Polycarbophil (FIBER) 625 MG TABS Take 2 capsules by mouth in the morning and at bedtime.   Past Week   cetirizine (ZYRTEC) 10 MG tablet Take 10 mg by mouth daily.   Past Week   cinacalcet (SENSIPAR) 30 MG tablet Take 30 mg by mouth daily with supper.   Past Week   ezetimibe (ZETIA) 10 MG tablet Take 10 mg by mouth daily.   Past Week   gabapentin (NEURONTIN) 300 MG capsule Take 300 mg by mouth at bedtime.   Past Week   midodrine (PROAMATINE) 10 MG tablet Take 10 mg by mouth 3 (three) times daily.   Past Week   Multiple Vitamins-Minerals (MULTIVITAMIN WITH MINERALS) tablet Take 1 tablet by mouth daily.   Past Week   omega-3 fish oil (MAXEPA) 1000 MG CAPS capsule Take 3 capsules by mouth daily.   Past Week   pantoprazole (PROTONIX) 40 MG tablet Take 40 mg by mouth 2 (two) times daily before a meal.    Past Week   insulin aspart (NOVOLOG) 100 UNIT/ML injection Inject 20 Units into the skin 3 (three) times daily.  (Patient not taking: Reported on 04/11/2022)   Not Taking   Insulin Pen Needle 32G X 4 MM MISC USE WITH INJECTIONS 4 TIMES DAILY (Patient not taking: Reported on 04/11/2022)   Not Taking   nitroGLYCERIN (NITROSTAT) 0.4 MG SL tablet Place 0.4 mg under the tongue every 5 (five) minutes as needed for chest pain. (Patient not taking: Reported on 04/11/2022)   Not Taking   sildenafil (VIAGRA) 100 MG tablet Take 100 mg by mouth as needed for erectile dysfunction. Reported on 06/14/2015 (Patient not taking: Reported on 04/11/2022)   Not Taking   TRESIBA FLEXTOUCH 200 UNIT/ML SOPN Inject 90 Units into  the skin at bedtime. (Patient not taking: Reported on 04/11/2022)   Not Taking   triamcinolone cream (KENALOG) 0.1 % Apply 1 application topically 2 (two) times daily as needed (irritation). (Patient not taking: Reported on 04/11/2022)   Not Taking   Social History   Socioeconomic History   Marital status: Married    Spouse name: Not on file   Number of children: Not on file   Years of education: Not on file   Highest education level: Not on file  Occupational History   Not on file  Tobacco Use   Smoking status: Former    Types: Cigarettes    Quit date: 04/24/2005    Years since quitting: 64.9  Smokeless tobacco: Former    Types: Nurse, children's Use: Never used  Substance and Sexual Activity   Alcohol use: No   Drug use: No   Sexual activity: Yes  Other Topics Concern   Not on file  Social History Narrative   Not on file   Social Determinants of Health   Financial Resource Strain: Not on file  Food Insecurity: No Food Insecurity (04/11/2022)   Hunger Vital Sign    Worried About Running Out of Food in the Last Year: Never true    Ran Out of Food in the Last Year: Never true  Transportation Needs: No Transportation Needs (04/11/2022)   PRAPARE - Hydrologist (Medical): No    Lack of Transportation (Non-Medical): No  Physical Activity: Not on file  Stress: Not on file  Social Connections: Not on file  Intimate Partner Violence: Not At Risk (04/11/2022)   Humiliation, Afraid, Rape, and Kick questionnaire    Fear of Current or Ex-Partner: No    Emotionally Abused: No    Physically Abused: No    Sexually Abused: No    Family History  Problem Relation Age of Onset   Dementia Mother 63   Osteoarthritis Mother    Cancer Mother       Intake/Output Summary (Last 24 hours) at 04/12/2022 0841 Last data filed at 04/11/2022 1800 Gross per 24 hour  Intake --  Output 0 ml  Net 0 ml    Vitals:   04/11/22 1441 04/11/22 1653  04/11/22 2321 04/12/22 0745  BP: 103/78 1'04/68 93/65 99/75 '$  Pulse: 60 62 63 61  Resp: '18  20 16  '$ Temp: 98 F (36.7 C) 98 F (36.7 C) 98 F (36.7 C) 97.8 F (36.6 C)  TempSrc:  Oral    SpO2: 97% 100% 98% 100%  Weight:      Height:        PHYSICAL EXAM General: Pleasant elderly Caucasian male, well nourished, in no acute distress.  Sitting upright in bed eating dinner HEENT:  Normocephalic and atraumatic. Neck:  No JVD.  Lungs: Normal respiratory effort on room air. Clear bilaterally to auscultation. No wheezes, crackles, rhonchi.  Heart: HRRR . Normal S1 and S2 without gallops or murmurs.  Abdomen: Non-distended appearing.  PD catheter present Msk: Normal strength and tone for age. Extremities: Warm and well perfused. No clubbing, cyanosis.  1-2+ bilateral lower extremity edema.  Neuro: Alert and oriented X 3. Psych:  Answers questions appropriately.   Labs: Basic Metabolic Panel: Recent Labs    04/10/22 2257 04/11/22 0443  NA 128* 129*  K 3.3* 3.3*  CL 91* 94*  CO2 23 24  GLUCOSE 161* 81  BUN 52* 52*  CREATININE 5.95* 5.74*  CALCIUM 7.4* 7.4*   Liver Function Tests: Recent Labs    04/11/22 1156  ALBUMIN 1.7*   No results for input(s): "LIPASE", "AMYLASE" in the last 72 hours. CBC: Recent Labs    04/10/22 2257 04/11/22 0443  WBC 6.4 7.7  HGB 10.5* 9.6*  HCT 31.3* 28.3*  MCV 91.0 91.6  PLT 256 241   Cardiac Enzymes: Recent Labs    04/11/22 0103 04/11/22 1156 04/11/22 1715  TROPONINIHS 108* 82* 86*   BNP: Recent Labs    04/10/22 2257  BNP >4,500.0*   D-Dimer: No results for input(s): "DDIMER" in the last 72 hours. Hemoglobin A1C: No results for input(s): "HGBA1C" in the last 72 hours. Fasting Lipid Panel: No  results for input(s): "CHOL", "HDL", "LDLCALC", "TRIG", "CHOLHDL", "LDLDIRECT" in the last 72 hours. Thyroid Function Tests: No results for input(s): "TSH", "T4TOTAL", "T3FREE", "THYROIDAB" in the last 72 hours.  Invalid input(s):  "FREET3" Anemia Panel: No results for input(s): "VITAMINB12", "FOLATE", "FERRITIN", "TIBC", "IRON", "RETICCTPCT" in the last 72 hours.   Radiology: US Abdomen Limited RUQ (LIVER/GB)  Result Date: 04/11/2022 CLINICAL DATA:  Shortness of breath EXAM: ULTRASOUND ABDOMEN LIMITED RIGHT UPPER QUADRANT COMPARISON:  None Available. FINDINGS: Gallbladder: Mobile gallstones in the gallbladder. Gallbladder wall thickening up to 0.6 cm. No pericholecystic fluid. No sonographic Murphy sign noted by sonographer. Common bile duct: Diameter: 0.5 cm Liver: No focal lesion identified. Within normal limits in parenchymal echogenicity. Portal vein is patent on color Doppler imaging with normal direction of blood flow towards the liver. Other: Incidental note of right pleural effusion. Simple appearing right renal cyst measuring 8.1 cm, benign, for which no specific further follow-up or characterization is required. IMPRESSION: 1. Mobile gallstones in the gallbladder. Gallbladder wall thickening up to 0.6 cm. No pericholecystic fluid or sonographic Murphy sign. Findings are concerning for acute cholecystitis. Consider further evaluation with nuclear medicine HIDA scan to evaluate for patency of the cystic and common bile ducts. 2. Incidental note of right pleural effusion. Electronically Signed   By: Delanna Ahmadi M.D.   On: 04/11/2022 14:09   DG Chest 2 View  Result Date: 04/10/2022 CLINICAL DATA:  Shortness of breath EXAM: CHEST - 2 VIEW COMPARISON:  Radiograph 03/13/2022 FINDINGS: Unchanged cardiomediastinal silhouette with postsurgical changes of CABG. There are small bilateral pleural effusions, right greater than left. There is a dense right basilar consolidation and patchy left basilar airspace opacities. No evidence of pneumothorax. No acute osseous abnormality. IMPRESSION: Small bilateral pleural effusions, right greater than left, with dense right basilar consolidation and patchy left basilar opacities favoring  atelectasis, infection is possible. Electronically Signed   By: Maurine Simmering M.D.   On: 04/10/2022 15:14   ECHOCARDIOGRAM COMPLETE  Result Date: 03/14/2022    ECHOCARDIOGRAM REPORT   Patient Name:   AENEAS LONGSWORTH Date of Exam: 03/14/2022 Medical Rec #:  [id number]           Height:       67.0 in Accession #:    [id number]          Weight:       162.0 lb Date of Birth:  12-13-1950           BSA:          1.849 m Patient Age:    64 years            BP:           117/65 mmHg Patient Gender: M                   HR:           59 bpm. Exam Location:  [facility] Procedure: 2D Echo, Color Doppler and Cardiac Doppler Indications:     R55 Syncope  History:         Patient has no prior history of Echocardiogram examinations.                  CKD; Risk Factors:Hypertension, Diabetes and Dyslipidemia.  Sonographer:     Charmayne Sheer Referring Phys:  [place] Diagnosing Phys: Serafina Royals MD  Sonographer Comments: Suboptimal subcostal window. IMPRESSIONS  1. Left ventricular ejection fraction, by estimation, is 25 to  30%. The left ventricle has severely decreased function. The left ventricle demonstrates global hypokinesis. The left ventricular internal cavity size was moderately dilated. There is moderate left ventricular hypertrophy. Left ventricular diastolic parameters are consistent with Grade II diastolic dysfunction (pseudonormalization).  2. Right ventricular systolic function is normal. The right ventricular size is normal.  3. Left atrial size was moderately dilated.  4. Right atrial size was mildly dilated.  5. The mitral valve is normal in structure. Moderate to severe mitral valve regurgitation.  6. Tricuspid valve regurgitation is moderate.  7. The aortic valve is calcified. Aortic valve regurgitation is trivial. Moderate aortic valve stenosis. FINDINGS  Left Ventricle: Left ventricular ejection fraction, by estimation, is 25 to 30%. The left ventricle has severely decreased function. The left  ventricle demonstrates global hypokinesis. The left ventricular internal cavity size was moderately dilated. There is moderate left ventricular hypertrophy. Left ventricular diastolic parameters are consistent with Grade II diastolic dysfunction (pseudonormalization). Right Ventricle: The right ventricular size is normal. No increase in right ventricular wall thickness. Right ventricular systolic function is normal. Left Atrium: Left atrial size was moderately dilated. Right Atrium: Right atrial size was mildly dilated. Pericardium: There is no evidence of pericardial effusion. Mitral Valve: The mitral valve is normal in structure. Moderate to severe mitral valve regurgitation. Tricuspid Valve: The tricuspid valve is normal in structure. Tricuspid valve regurgitation is moderate. Aortic Valve: The aortic valve is calcified. Aortic valve regurgitation is trivial. Moderate aortic stenosis is present. Aortic valve mean gradient measures 27.5 mmHg. Aortic valve peak gradient measures 47.9 mmHg. Aortic valve area, by VTI measures 0.84  cm. Pulmonic Valve: The pulmonic valve was normal in structure. Pulmonic valve regurgitation is trivial. Aorta: The aortic root and ascending aorta are structurally normal, with no evidence of dilitation. IAS/Shunts: No atrial level shunt detected by color flow Doppler.  LEFT VENTRICLE PLAX 2D LVIDd:         6.60 cm      Diastology LVIDs:         6.00 cm      LV e' medial:    4.13 cm/s LV PW:         1.20 cm      LV E/e' medial:  15.4 LV IVS:        1.00 cm      LV e' lateral:   8.81 cm/s LVOT diam:     2.20 cm      LV E/e' lateral: 7.2 LV SV:         69 LV SV Index:   37 LVOT Area:     3.80 cm  LV Volumes (MOD) LV vol d, MOD A2C: 179.0 ml LV vol d, MOD A4C: 197.0 ml LV vol s, MOD A2C: 106.0 ml LV vol s, MOD A4C: 128.0 ml LV SV MOD A2C:     73.0 ml LV SV MOD A4C:     197.0 ml LV SV MOD BP:      72.4 ml RIGHT VENTRICLE RV Basal diam:  3.30 cm RV S prime:     9.14 cm/s LEFT ATRIUM              Index        RIGHT ATRIUM           Index LA diam:        4.80 cm 2.60 cm/m   RA Area:     15.50 cm LA Vol (A2C):   42.4 ml 22.93 ml/m  RA  Volume:   37.30 ml  20.17 ml/m LA Vol (A4C):   42.4 ml 22.93 ml/m LA Biplane Vol: 44.4 ml 24.01 ml/m  AORTIC VALVE                     PULMONIC VALVE AV Area (Vmax):    0.88 cm      PV Vmax:       1.12 m/s AV Area (Vmean):   0.81 cm      PV Vmean:      80.200 cm/s AV Area (VTI):     0.84 cm      PV VTI:        0.281 m AV Vmax:           346.00 cm/s   PV Peak grad:  5.0 mmHg AV Vmean:          244.500 cm/s  PV Mean grad:  3.0 mmHg AV VTI:            0.824 m AV Peak Grad:      47.9 mmHg AV Mean Grad:      27.5 mmHg LVOT Vmax:         80.20 cm/s LVOT Vmean:        51.900 cm/s LVOT VTI:          0.182 m LVOT/AV VTI ratio: 0.22  AORTA Ao Root diam: 3.50 cm MITRAL VALVE MV Area (PHT): 3.54 cm    SHUNTS MV Decel Time: 214 msec    Systemic VTI:  0.18 m MV E velocity: 63.40 cm/s  Systemic Diam: 2.20 cm MV A velocity: 98.00 cm/s MV E/A ratio:  0.65 Serafina Royals MD Electronically signed by Serafina Royals MD Signature Date/Time: 03/14/2022/12:52:39 PM    Final    DG Chest 2 View  Result Date: 03/13/2022 CLINICAL DATA:  Shortness of breath.  Low blood pressure. EXAM: CHEST - 2 VIEW COMPARISON:  Chest CT 12/01/2021.  Radiographs 05/14/2015. FINDINGS: The heart size and mediastinal contours are stable status post median sternotomy and CABG. The lungs are clear. There is no pleural effusion or pneumothorax. No acute osseous findings are demonstrated. IMPRESSION: Stable chest. No acute cardiopulmonary process. Previous CABG. Electronically Signed   By: Richardean Sale M.D.   On: 03/13/2022 16:58    ECHO 03/14/2022  1. Left ventricular ejection fraction, by estimation, is 25 to 30%. The  left ventricle has severely decreased function. The left ventricle  demonstrates global hypokinesis. The left ventricular internal cavity size  was moderately dilated. There is  moderate  left ventricular hypertrophy. Left ventricular diastolic  parameters are consistent with Grade II diastolic dysfunction  (pseudonormalization).   2. Right ventricular systolic function is normal. The right ventricular  size is normal.   3. Left atrial size was moderately dilated.   4. Right atrial size was mildly dilated.   5. The mitral valve is normal in structure. Moderate to severe mitral  valve regurgitation.   6. Tricuspid valve regurgitation is moderate.   7. The aortic valve is calcified. Aortic valve regurgitation is trivial.  Moderate aortic valve stenosis.   LHC 2022  Origin to Prox Graft lesion is 100% stenosed.  Prox RCA lesion is 100% stenosed.  Mid Cx to Dist Cx lesion is 100% stenosed.  Ost Cx lesion is 70% stenosed.  1st Mrg lesion is 70% stenosed.  Mid LAD lesion is 100% stenosed.  Origin to Prox Graft lesion is 100% stenosed.  Prox LAD lesion is 50% stenosed  with 50% stenosed side branch in 1st  Diag.  Previously placed Mid LM to Prox LAD stent (unknown type) is widely  patent.  LV end diastolic pressure is normal.  There is mild aortic valve stenosis.   Conclusion:  Severe 3 vessel coronary artery disease. CTO RCA, occluded mid LAD (patent  stent in LMCA to LAD), 70% ostial Lcx, 70% mid OM1.  Occluded SVG to RCA and OM. Patent LIMA to LAD.  Mild aortic stenosis.  Normal LVEDP   TELEMETRY reviewed by me (LT) 04/12/2022 : Sinus rhythm first-degree AV block rate 60s  EKG reviewed by me: Sinus rhythm first-degree AV block with PVCs in a pattern of bigeminy   Principal Problem:   Acute on chronic HFrEF (heart failure with reduced ejection fraction) (HCC) Active Problems:   Coronary artery disease   Dyslipidemia   Hyponatremia   Type 2 diabetes mellitus with peripheral neuropathy (HCC)   Hypokalemia   Hypotension   ESRD on peritoneal dialysis [facility])    ASSESSMENT AND PLAN:  Januel Doolan. Parkison is a 12yoM with a PMH of CAD s/p CABG (1996) with  underlying severe 3v CAD, moderate AS, mod-sev MR, HFrEF (25-30%, G2 DD, global hypo-02/2022), ESRD on PD, DM2 who presented to [facility] ED 04/11/2022 with worsening dyspnea on exertion and decreased UOP.  Cardiology is consulted for assistance with his heart failure.  # ESRD on peritoneal dialysis Complicated by hypotension, had decreased urine output over the past week in the setting of change in his dialysate prescription.  Made good urine this morning, swelling has "become less taut" but remains present.  # Acute on chronic HFrEF (EF 20%) # Moderate-severe aortic stenosis He has a longstanding history of ischemic cardiomyopathy, worsening over the past 2 years.  Currently his primary symptom is worsening peripheral edema with bendopnea.  He is not hypoxic and is hemodynamically stable.  Echo this admission shows slight worsening in his ejection fraction and moderate-severe aortic stenosis, that is likely on the severe side with his low output cardiomyopathy.  He has an appointment with [name] at [facility] advanced heart failure on January 4th for consideration of advanced therapies. Would also recommend consideration of TAVR at that time. -Low blood pressures preclude the addition of GDMT unfortunately, he was recently taken off of spironolactone and has required midodrine to support his blood pressure during dialysis.  # CAD s/p CABG 1996 # Demand ischemia Denies chest pain, EKG is nonacute.  Troponins elevated with a flat trend which is most consistent with demand/supply mismatch and not ACS  -Continue aspirin 81 mg daily, Zetia 10 mg daily  This patient's plan of care was discussed and created with [name] and he is in agreement.  Signed: Tristan Schroeder , PA-C 04/12/2022, 8:41 AM [facility]

## 2022-04-12 NOTE — Progress Notes (Signed)
Pre [facility]CP[facility] RN assessment

## 2022-04-12 NOTE — Plan of Care (Signed)
  Problem: Health Behavior/Discharge Planning: Goal: Ability to manage health-related needs will improve Outcome: Progressing   Problem: Nutritional: Goal: Maintenance of adequate nutrition will improve Outcome: Progressing   Problem: Activity: Goal: Risk for activity intolerance will decrease Outcome: Progressing   Problem: Nutrition: Goal: Adequate nutrition will be maintained Outcome: Progressing

## 2022-04-12 NOTE — Progress Notes (Signed)
Pt alert, vss, MD and partner present. PD treatment started w/ no complications.

## 2022-04-12 NOTE — Assessment & Plan Note (Signed)
Blood pressure remained soft. -Continue with midodrine

## 2022-04-12 NOTE — Progress Notes (Signed)
[facility]  ROUNDING NOTE   Subjective:   Roger Knight is a 71 year old male with past medical conditions including GERD, hypertension, dyslipidemia, anemia, osteoarthritis, and end-stage renal disease on peritoneal dialysis.  Patient presents to the emergency department with complaints of increased shortness of breath and lower extremity edema.  Patient has been admitted for SOB (shortness of breath) [R06.02] Fluid overload [E87.70] Hypotension, unspecified hypotension type [I95.9] Acute on chronic HFrEF (heart failure with reduced ejection fraction) ([place]) [I50.23]  Patient is known to our practice and his peritoneal dialysis is followed outpatient at [facility] by Dr. Holley Raring.    Patient seen resting in bed, family at bedside Reports appetite improving Denies nausea and vomiting Remains on room air Lower extremity edema remains   Objective:  Vital signs in last 24 hours:  Temp:  [97.8 F (36.6 C)-98 F (36.7 C)] 97.9 F (36.6 C) (12/20 1400) Pulse Rate:  [61-65] 65 (12/20 1400) Resp:  [16-20] 16 (12/20 1400) BP: (93-104)/(65-75) 95/74 (12/20 1400) SpO2:  [98 %-100 %] 100 % (12/20 1400)  Weight change:  Filed Weights   04/10/22 1435  Weight: 78 kg    Intake/Output: No intake/output data recorded.   Intake/Output this shift:  No intake/output data recorded.  Physical Exam: General: NAD  Head: Normocephalic, atraumatic. Moist oral mucosal membranes  Eyes: Anicteric  Lungs:  Clear to auscultation, normal effort, room air  Heart: Regular rate and rhythm  Abdomen:  Soft, nontender, PD catheter  Extremities: 2+ peripheral edema.  Neurologic: Alert and oriented, moving all four extremities  Skin: No lesions  Access: PD catheter    Basic Metabolic Panel: Recent Labs  Lab 04/10/22 2257 04/11/22 0443  NA 128* 129*  K 3.3* 3.3*  CL 91* 94*  CO2 23 24  GLUCOSE 161* 81  BUN 52* 52*  CREATININE 5.95* 5.74*  CALCIUM 7.4* 7.4*      Liver Function Tests: Recent Labs  Lab 04/11/22 1156  ALBUMIN 1.7*    No results for input(s): "LIPASE", "AMYLASE" in the last 168 hours. No results for input(s): "AMMONIA" in the last 168 hours.  CBC: Recent Labs  Lab 04/10/22 2257 04/11/22 0443  WBC 6.4 7.7  HGB 10.5* 9.6*  HCT 31.3* 28.3*  MCV 91.0 91.6  PLT 256 241     Cardiac Enzymes: No results for input(s): "CKTOTAL", "CKMB", "CKMBINDEX", "TROPONINI" in the last 168 hours.  BNP: Invalid input(s): "POCBNP"  CBG: No results for input(s): "GLUCAP" in the last 168 hours.  Microbiology: Results for orders placed or performed during the hospital encounter of 04/11/22  Resp panel by RT-PCR (RSV, Flu A&B, Covid) Anterior Nasal Swab     Status: None   Collection Time: 04/10/22 11:00 PM   Specimen: Anterior Nasal Swab  Result Value Ref Range Status   SARS Coronavirus 2 by RT PCR NEGATIVE NEGATIVE Final    Comment: (NOTE) SARS-CoV-2 target nucleic acids are NOT DETECTED.  The SARS-CoV-2 RNA is generally detectable in upper respiratory specimens during the acute phase of infection. The lowest concentration of SARS-CoV-2 viral copies this assay can detect is 138 copies/mL. A negative result does not preclude SARS-Cov-2 infection and should not be used as the sole basis for treatment or other patient management decisions. A negative result may occur with  improper specimen collection/handling, submission of specimen other than nasopharyngeal swab, presence of viral mutation(s) within the areas targeted by this assay, and inadequate number of viral copies(<138 copies/mL). A negative result must be combined with  clinical observations, patient history, and epidemiological information. The expected result is Negative.  Fact Sheet for Patients:  EntrepreneurPulse.com.au  Fact Sheet for Healthcare Providers:  IncredibleEmployment.be  This test is no t yet approved or cleared  by the [place] FDA and  has been authorized for detection and/or diagnosis of SARS-CoV-2 by FDA under an Emergency Use Authorization (EUA). This EUA will remain  in effect (meaning this test can be used) for the duration of the COVID-19 declaration under Section 564(b)(1) of the Act, 21 U.S.C.section 360bbb-3(b)(1), unless the authorization is terminated  or revoked sooner.       Influenza A by PCR NEGATIVE NEGATIVE Final   Influenza B by PCR NEGATIVE NEGATIVE Final    Comment: (NOTE) The Xpert Xpress SARS-CoV-2/FLU/RSV plus assay is intended as an aid in the diagnosis of influenza from Nasopharyngeal swab specimens and should not be used as a sole basis for treatment. Nasal washings and aspirates are unacceptable for Xpert Xpress SARS-CoV-2/FLU/RSV testing.  Fact Sheet for Patients: EntrepreneurPulse.com.au  Fact Sheet for Healthcare Providers: IncredibleEmployment.be  This test is not yet approved or cleared by the [place] FDA and has been authorized for detection and/or diagnosis of SARS-CoV-2 by FDA under an Emergency Use Authorization (EUA). This EUA will remain in effect (meaning this test can be used) for the duration of the COVID-19 declaration under Section 564(b)(1) of the Act, 21 U.S.C. section 360bbb-3(b)(1), unless the authorization is terminated or revoked.     Resp Syncytial Virus by PCR NEGATIVE NEGATIVE Final    Comment: (NOTE) Fact Sheet for Patients: EntrepreneurPulse.com.au  Fact Sheet for Healthcare Providers: IncredibleEmployment.be  This test is not yet approved or cleared by the [place] FDA and has been authorized for detection and/or diagnosis of SARS-CoV-2 by FDA under an Emergency Use Authorization (EUA). This EUA will remain in effect (meaning this test can be used) for the duration of the COVID-19 declaration under Section 564(b)(1) of the Act, 21  U.S.C. section 360bbb-3(b)(1), unless the authorization is terminated or revoked.  Performed at [facility], [address] [place], [place] [address]     Coagulation Studies: No results for input(s): "LABPROT", "INR" in the last 72 hours.  Urinalysis: No results for input(s): "COLORURINE", "LABSPEC", "PHURINE", "GLUCOSEU", "HGBUR", "BILIRUBINUR", "KETONESUR", "PROTEINUR", "UROBILINOGEN", "NITRITE", "LEUKOCYTESUR" in the last 72 hours.  Invalid input(s): "APPERANCEUR"    Imaging: US Abdomen Limited RUQ (LIVER/GB)  Result Date: 04/11/2022 CLINICAL DATA:  Shortness of breath EXAM: ULTRASOUND ABDOMEN LIMITED RIGHT UPPER QUADRANT COMPARISON:  None Available. FINDINGS: Gallbladder: Mobile gallstones in the gallbladder. Gallbladder wall thickening up to 0.6 cm. No pericholecystic fluid. No sonographic Murphy sign noted by sonographer. Common bile duct: Diameter: 0.5 cm Liver: No focal lesion identified. Within normal limits in parenchymal echogenicity. Portal vein is patent on color Doppler imaging with normal direction of blood flow towards the liver. Other: Incidental note of right pleural effusion. Simple appearing right renal cyst measuring 8.1 cm, benign, for which no specific further follow-up or characterization is required. IMPRESSION: 1. Mobile gallstones in the gallbladder. Gallbladder wall thickening up to 0.6 cm. No pericholecystic fluid or sonographic Murphy sign. Findings are concerning for acute cholecystitis. Consider further evaluation with nuclear medicine HIDA scan to evaluate for patency of the cystic and common bile ducts. 2. Incidental note of right pleural effusion. Electronically Signed   By: Delanna Ahmadi M.D.   On: 04/11/2022 14:09     Medications:    dialysis solution 1.5% low-MG/low-CA  aspirin EC  81 mg Oral Daily   calcitRIOL  0.25 mcg Oral Daily   cinacalcet  30 mg Oral Q supper   ezetimibe  10 mg Oral Daily   gabapentin  300 mg Oral QHS    gentamicin cream  1 Application Topical Daily   heparin injection (subcutaneous)  5,000 Units Subcutaneous Q8H   insulin aspart  0-5 Units Subcutaneous QHS   insulin aspart  0-9 Units Subcutaneous TID WC   loratadine  10 mg Oral Daily   midodrine  10 mg Oral TID with meals   multivitamin with minerals  1 tablet Oral Daily   omega-3 acid ethyl esters  3 g Oral Daily   pantoprazole  40 mg Oral BID AC   acetaminophen **OR** acetaminophen, magnesium hydroxide, ondansetron **OR** ondansetron (ZOFRAN) IV, traZODone  Assessment/ Plan:  Roger Knight is a 71 y.o.  male with past medical conditions including GERD, hypertension, dyslipidemia, anemia, osteoarthritis, and end-stage renal disease on peritoneal dialysis.  Patient presents to the emergency department with complaints of increased shortness of breath and lower extremity edema.  Patient has been admitted for SOB (shortness of breath) [R06.02] Fluid overload [E87.70] Hypotension, unspecified hypotension type [I95.9] Acute on chronic HFrEF (heart failure with reduced ejection fraction) (HCC) [I50.23]   End-stage renal disease on peritoneal dialysis.  Patient with significant lower extremity edema up to upper thighs.  Denies missing recent treatments.    Treatment not performed overnight due to semi-private room. Providing treatment today with 1.5% dialysate. Remains fluid overloaded, unable to remove fluid due to hypotension. May consider a slow dwell tomorrow, depending on blood pressure.   2. Anemia of chronic kidney disease Lab Results  Component Value Date   HGB 9.6 (L) 04/11/2022  Hemoglobin within desired target.  Will continue to monitor.  3.  Hypotension, etiology unclear. Continue midodrine  4.  Acute on chronic systolic heart failure.  Echo from 03/14/2022 shows EF 25 to 30% with a severe mitral and moderate tricuspid valve regurgitation.  Recent ECHO shows EF <20.   LOS: 1   12/20/20233:30  PM  Patient was examined and evaluated with Colon Flattery, NP.  Plan of care was formulated and discussed with patient as well as NP.  I agree with the note as documented with edits.

## 2022-04-12 NOTE — Progress Notes (Signed)
*  PRELIMINARY RESULTS* Echocardiogram 2D Echocardiogram has been performed.  Roger Knight Roger Knight 04/12/2022, 11:37 AM

## 2022-04-12 NOTE — Progress Notes (Signed)
Patient states blood glucose is 95 per his implanted monitor.

## 2022-04-12 NOTE — Progress Notes (Signed)
Progress Note   Patient: Roger Knight MWU:[id number] DOB: 15-Apr-1951 DOA: 04/11/2022     1 DOS: the patient was seen and examined on 04/12/2022   Brief hospital course: Taken from H&P.  Roger Knight is a 71 y.o. Caucasian male with medical history significant for osteoarthritis, anemia, end-stage renal disease on peritoneal dialysis, GERD, hypertension, HFrEF(20 to 25%) and dyslipidemia, who presented to the ER with acute onset of worsening dyspnea with associated dyspnea on exertion and lower extremity edema without paroxysmal nocturnal dyspnea or orthopnea.  He denied any fever or chills.  No cough or wheezing.  No chest pain or palpitations.   ED Course: When he came to the ER BP was 84/62 and later 113/63 with heart rate of 59 and later 60.  Labs revealed hyponatremia and hypochloremia with hypokalemia and blood glucose of 161 BUN of 52 and creatinine 5.95.  BNP was more than 4500 and high sensitive troponin I was 95 and later 108. EKG as reviewed by me : EKG showed sinus rhythm with a rate of 60 with frequent PVCs in a pattern of bigeminy with nothing specific interventricular conduction delay and Imaging: Two-view chest x-ray showed the following: Small bilateral pleural effusions, right greater than left, with dense right basilar consolidation and patchy left basilar opacities favoring atelectasis, infection is possible. Nephrology was consulted.  12/19: Blood pressure remained soft at 88/59, sodium 129, potassium 3.3, creatinine 5.74.  COVID-19, influenza and RSV negative.  Significant lower extremity edema.  Urinary output has been decreased significantly recently.  Softer blood pressure is a barrier for more fluid removal. Nephrology is trying to adjust dialysis and his medications, they are recommending to involve cardiology.  Patient follow-up with [facility] cardiology- [name] notified. Patient might need some extra pressure support for effective  diuresis.  12/20: Vital stable.  No urinary output recorded but per patient he had a large urinary output this morning.  He does not urinate very frequently.  No shortness of breath.  Lower extremity edema seems little improved than before.  Repeat limited echocardiogram with EF of less than 20% and severely dilated cardiomegaly-pending cardiology consult.  Patient is high risk for deterioration and mortality based on underlying comorbidities and worsening heart failure.  Assessment and Plan: * Acute on chronic HFrEF (heart failure with reduced ejection fraction) (HCC) Recent echocardiogram done on 03/14/2022 with EF of 25 to 30% with moderate to severe mitral and tricuspid regurgitation.  Blood pressure soft which is a barrier for extra fluid removal.  Nephrology is trying to adjust dialysis and medication. Markedly elevated BNP with significant lower extremity edema. Repeat echocardiogram with worsening of EF to less than 20% and severely dilated cardiomyopathy. -Might need some extra pressure support for diuresis. -Cardiology consult -Daily weight and BMP -Strict intake and output  Hypokalemia Potassium remained at 3.3 -Replace with 20 mEq-will avoid extra potassium due to ESRD -Continue to monitor  Hyponatremia Most likely due to volume overload.  Patient did receive 500 cc IV fluid in ED. -Sodium with slight improvement to 129. -Fluid restriction with possible diuresis or extra ultrafiltrate -Monitor sodium  ESRD on peritoneal dialysis [facility]) Patient is on peritoneal dialysis.  Nephrology is on board. -Continue with peritoneal dialysis-as advised by nephrology  Hypotension Blood pressure remained soft. -Continue with midodrine  Type 2 diabetes mellitus with peripheral neuropathy (HCC) Recent A1c of 6.6.  CBG currently within goal. -Sensitive SSI  Dyslipidemia - We will continue Zetia and omega-3 fish oil.  Coronary artery disease  Mildly elevated troponin which peaked at  108 with downward trend, most likely secondary to demand supply mismatch with fluid overload.  No chest pain - We will continue aspirin.   Subjective: Patient was seen and examined today.  No new complaints.  Had 1 good urinary output earlier in the morning.  Does not make much urine as General.  Denies any chest pain or shortness of breath.  Physical Exam: Vitals:   04/11/22 2321 04/12/22 0745 04/12/22 1400 04/12/22 1615  BP: '93/65 99/75 95/74 '$ 96/72  Pulse: 63 61 65 71  Resp: '20 16 16 18  '$ Temp: 98 F (36.7 C) 97.8 F (36.6 C) 97.9 F (36.6 C) 97.9 F (36.6 C)  TempSrc:      SpO2: 98% 100% 100% 100%  Weight:      Height:       General.  Frail gentleman, in no acute distress. Pulmonary.  Lungs clear bilaterally, normal respiratory effort. CV.  Regular rate and rhythm, no JVD, rub or murmur. Abdomen.  Soft, nontender, nondistended, BS positive. CNS.  Alert and oriented .  No focal neurologic deficit. Extremities.  2+ LE edema, no cyanosis, pulses intact and symmetrical. Psychiatry.  Judgment and insight appears normal.   Data Reviewed: Prior data reviewed  Family Communication: Discussed with wife at bedside  Disposition: Status is: Inpatient Remains inpatient appropriate because: Severity of illness  Planned Discharge Destination: Home  DVT prophylaxis.  Subcu heparin Time spent: 50 minutes  This record has been created using Systems analyst. Errors have been sought and corrected,but may not always be located. Such creation errors do not reflect on the standard of care.   Author: Lorella Nimrod, MD 04/12/2022 5:12 PM  For on call review [email or web address].

## 2022-04-12 NOTE — Progress Notes (Addendum)
Pt PD complete. Alert, stable, no c/o. Updates given to primary RN. Total therapy time 10 hour 28 minutes. Initial drain volume: 14m Average dwell time: 120 min Average drain time: 18 min Total therapy volume 8001 PD catheter dressing site cleaned. Gentamicin cream applied. New dressing w/ biopatch. Thick yellow and white discharge around catheter site. MD and primary RN notified. Pt has no c/o.

## 2022-04-12 NOTE — Assessment & Plan Note (Signed)
Recent echocardiogram done on 03/14/2022 with EF of 25 to 30% with moderate to severe mitral and tricuspid regurgitation.  Blood pressure soft which is a barrier for extra fluid removal.  Nephrology is trying to adjust dialysis and medication. Markedly elevated BNP with significant lower extremity edema. Repeat echocardiogram with worsening of EF to less than 20% and severely dilated cardiomyopathy. -Might need some extra pressure support for diuresis. -Cardiology consult -Daily weight and BMP -Strict intake and output

## 2022-04-12 NOTE — Progress Notes (Addendum)
1004 Pt refuses BS check and insulin. Monitors his own BS with his continuous BS device. States his BS is reading 101 at this time.  1005 Pt peritoneal dialysis set up at bedside.

## 2022-04-12 NOTE — Progress Notes (Addendum)
Initial Nutrition Assessment  DOCUMENTATION CODES:   Not applicable  INTERVENTION:   -Liberalize diet to 2 gram sodium -Renal MVI daily -Double protein portions with meals  NUTRITION DIAGNOSIS:   Increased nutrient needs related to chronic illness (ESRD on HD) as evidenced by estimated needs.  GOAL:   Patient will meet greater than or equal to 90% of their needs  MONITOR:   PO intake, Supplement acceptance  REASON FOR ASSESSMENT:   Malnutrition Screening Tool    ASSESSMENT:   Pt with medical history significant for osteoarthritis, anemia, end-stage renal disease on peritoneal dialysis, GERD, hypertension and dyslipidemia, who presented with acute onset of worsening dyspnea with associated dyspnea on exertion and lower extremity edema without paroxysmal nocturnal dyspnea or orthopnea.  Pt admitted with fluid overload secondary to ESRD on PD.   Spoke with pt and wife at bedside. Pt reports decreased oral intake over the past weak, due to nausea when eating. Pt typically has a very hearty appetite. Pt shares she is consuming 100% of meals, but is getting frustrated with the limited choices on the heart healthy diet.    Reviewed wt hx; pt has experienced a 9.7% wt loss over the past 11 months. Per pt, he estimates he has lost about 50# unintentionally over the past year. Per pt, PD treatments are going well, but K levels are usually low.   Discussed importance of good meal intake to promote healing.   Lab Results  Component Value Date   HGBA1C 6.6 (H) 03/13/2022   PTA DM medications are none. Pt reports not taking any insulin of DM medications. 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 Pt has gel packets at bedside, which he routinely uses at home.   Labs reviewed: Na: 129, K: 3.3, CBGS: 100 (inpatient orders for glycemic control are 0-9 units insulin aspart TID with meals).    NUTRITION - FOCUSED PHYSICAL EXAM:  Flowsheet Row Most Recent Value   Orbital Region No depletion  Upper Arm Region Mild depletion  Thoracic and Lumbar Region No depletion  Buccal Region No depletion  Temple Region Mild depletion  Clavicle Bone Region No depletion  Clavicle and Acromion Bone Region No depletion  Scapular Bone Region No depletion  Dorsal Hand No depletion  Patellar Region No depletion  Anterior Thigh Region No depletion  Posterior Calf Region No depletion  Edema (RD Assessment) Moderate  Hair Reviewed  Eyes Reviewed  Mouth Reviewed  Skin Reviewed  Nails Reviewed       Diet Order:   Diet Order             Diet 2 gram sodium Room service appropriate? Yes; Fluid consistency: Thin  Diet effective now                   EDUCATION NEEDS:   Education needs have been addressed  Skin:  Skin Assessment: Reviewed RN Assessment  Last BM:  04/11/22  Height:   Ht Readings from Last 1 Encounters:  04/10/22 '5\' 7"'$  (1.702 m)    Weight:   Wt Readings from Last 1 Encounters:  04/10/22 78 kg    Ideal Body Weight:  67.3 kg  BMI:  Body mass index is 26.94 kg/m.  Estimated Nutritional Needs:   Kcal:  1950-2150  Protein:  105-120 grams  Fluid:  1000 ml + UOP    Loistine Chance, RD, LDN, [place] Registered Dietitian II Certified Diabetes Care and Education Specialist Please refer to [facility] for RD  and/or RD on-call/weekend/after hours pager

## 2022-04-13 DIAGNOSIS — E876 Hypokalemia: Secondary | ICD-10-CM | POA: Diagnosis not present

## 2022-04-13 DIAGNOSIS — I5023 Acute on chronic systolic (congestive) heart failure: Secondary | ICD-10-CM | POA: Diagnosis not present

## 2022-04-13 DIAGNOSIS — R079 Chest pain, unspecified: Secondary | ICD-10-CM

## 2022-04-13 DIAGNOSIS — E871 Hypo-osmolality and hyponatremia: Secondary | ICD-10-CM | POA: Diagnosis not present

## 2022-04-13 DIAGNOSIS — N186 End stage renal disease: Secondary | ICD-10-CM | POA: Diagnosis not present

## 2022-04-13 LAB — BASIC METABOLIC PANEL
Anion gap: 13 (ref 5–15)
BUN: 58 mg/dL — ABNORMAL HIGH (ref 8–23)
CO2: 24 mmol/L (ref 22–32)
Calcium: 8 mg/dL — ABNORMAL LOW (ref 8.9–10.3)
Chloride: 92 mmol/L — ABNORMAL LOW (ref 98–111)
Creatinine, Ser: 6.07 mg/dL — ABNORMAL HIGH (ref 0.61–1.24)
GFR, Estimated: 9 mL/min — ABNORMAL LOW (ref >60)
Glucose, Bld: 95 mg/dL (ref 70–99)
Potassium: 4.9 mmol/L (ref 3.5–5.1)
Sodium: 129 mmol/L — ABNORMAL LOW (ref 135–145)

## 2022-04-13 LAB — CBC
HCT: 29.6 % — ABNORMAL LOW (ref 39.0–52.0)
Hemoglobin: 9.8 g/dL — ABNORMAL LOW (ref 13.0–17.0)
MCH: 30.2 pg (ref 26.0–34.0)
MCHC: 33.1 g/dL (ref 30.0–36.0)
MCV: 91.4 fL (ref 80.0–100.0)
Platelets: 245 10*3/uL (ref 150–400)
RBC: 3.24 MIL/uL — ABNORMAL LOW (ref 4.22–5.81)
RDW: 14.7 % (ref 11.5–15.5)
WBC: 10.6 10*3/uL — ABNORMAL HIGH (ref 4.0–10.5)
nRBC: 0 % (ref 0.0–0.2)

## 2022-04-13 LAB — HEPATITIS B E ANTIBODY: Hep B E Ab: NEGATIVE

## 2022-04-13 LAB — HEPATITIS B SURFACE ANTIBODY, QUANTITATIVE: Hep B S AB Quant (Post): 226.5 m[IU]/mL (ref >9.9)

## 2022-04-13 MED ORDER — DELFLEX-LC/2.5% DEXTROSE 394 MOSM/L IP SOLN: Freq: Once | INTRAPERITONEAL | Status: AC

## 2022-04-13 MED ORDER — DELFLEX-LC/2.5% DEXTROSE 394 MOSM/L IP SOLN
Freq: Once | INTRAPERITONEAL | Status: DC
  Filled 2022-04-13: qty 3000

## 2022-04-13 MED ORDER — GENTAMICIN SULFATE 0.1 % EX CREA: 1.0000 | TOPICAL_CREAM | Freq: Every day | CUTANEOUS | Status: DC

## 2022-04-13 MED ORDER — DOXYCYCLINE HYCLATE 100 MG PO TABS
100.0000 mg | ORAL_TABLET | Freq: Two times a day (BID) | ORAL | Status: DC
  Administered 2022-04-13 – 2022-04-14 (×2): 100 mg via ORAL
  Filled 2022-04-13 (×2): qty 1

## 2022-04-13 MED ORDER — DELFLEX-LC/1.5% DEXTROSE 344 MOSM/L IP SOLN: INTRAPERITONEAL | Status: DC

## 2022-04-13 MED ORDER — FUROSEMIDE 40 MG PO TABS
40.0000 mg | ORAL_TABLET | Freq: Every day | ORAL | Status: DC
  Administered 2022-04-13 – 2022-04-14 (×2): 40 mg via ORAL
  Filled 2022-04-13 (×2): qty 1

## 2022-04-13 NOTE — Progress Notes (Signed)
Patient states his blood glucose is 80 per implanted monitor.

## 2022-04-13 NOTE — Progress Notes (Signed)
[facility]  ROUNDING NOTE   Subjective:   Roger Knight is a 71 year old male with past medical conditions including GERD, hypertension, dyslipidemia, anemia, osteoarthritis, and end-stage renal disease on peritoneal dialysis.  Patient presents to the emergency department with complaints of increased shortness of breath and lower extremity edema.  Patient has been admitted for SOB (shortness of breath) [R06.02] Fluid overload [E87.70] Hypotension, unspecified hypotension type [I95.9] Acute on chronic HFrEF (heart failure with reduced ejection fraction) ([place]) [I50.23]  Patient is known to our practice and his peritoneal dialysis is followed outpatient at [facility] by Dr. Holley Raring.    Patient seen resting quietly, family at bedside Alert and oriented Tolerating meals without nausea and vomiting Remains on room air, denies shortness of breath Lower extremity edema remains   Objective:  Vital signs in last 24 hours:  Temp:  [97.6 F (36.4 C)-98.3 F (36.8 C)] 98.3 F (36.8 C) (12/21 1514) Pulse Rate:  [71-82] 82 (12/21 1514) Resp:  [17-18] 18 (12/21 1514) BP: (87-102)/(59-72) 102/59 (12/21 1514) SpO2:  [99 %-100 %] 99 % (12/21 1514) Weight:  [88.4 kg] 88.4 kg (12/21 0607)  Weight change:  Filed Weights   04/10/22 1435 04/13/22 0607  Weight: 78 kg 88.4 kg    Intake/Output: I/O last 3 completed shifts: In: 660 [P.O.:660] Out: 0    Intake/Output this shift:  No intake/output data recorded.  Physical Exam: General: NAD  Head: Normocephalic, atraumatic. Moist oral mucosal membranes  Eyes: Anicteric  Lungs:  Clear to auscultation, normal effort, room air  Heart: Regular rate and rhythm  Abdomen:  Soft, nontender, PD catheter  Extremities: 2+ peripheral edema.  Neurologic: Alert and oriented, moving all four extremities  Skin: No lesions  Access: PD catheter    Basic Metabolic Panel: Recent Labs  Lab 04/10/22 2257 04/11/22 0443  04/13/22 0246  NA 128* 129* 129*  K 3.3* 3.3* 4.9  CL 91* 94* 92*  CO2 '23 24 24  '$ GLUCOSE 161* 81 95  BUN 52* 52* 58*  CREATININE 5.95* 5.74* 6.07*  CALCIUM 7.4* 7.4* 8.0*     Liver Function Tests: Recent Labs  Lab 04/11/22 1156  ALBUMIN 1.7*    No results for input(s): "LIPASE", "AMYLASE" in the last 168 hours. No results for input(s): "AMMONIA" in the last 168 hours.  CBC: Recent Labs  Lab 04/10/22 2257 04/11/22 0443 04/13/22 0246  WBC 6.4 7.7 10.6*  HGB 10.5* 9.6* 9.8*  HCT 31.3* 28.3* 29.6*  MCV 91.0 91.6 91.4  PLT 256 241 245     Cardiac Enzymes: No results for input(s): "CKTOTAL", "CKMB", "CKMBINDEX", "TROPONINI" in the last 168 hours.  BNP: Invalid input(s): "POCBNP"  CBG: No results for input(s): "GLUCAP" in the last 168 hours.  Microbiology: Results for orders placed or performed during the hospital encounter of 04/11/22  Resp panel by RT-PCR (RSV, Flu A&B, Covid) Anterior Nasal Swab     Status: None   Collection Time: 04/10/22 11:00 PM   Specimen: Anterior Nasal Swab  Result Value Ref Range Status   SARS Coronavirus 2 by RT PCR NEGATIVE NEGATIVE Final    Comment: (NOTE) SARS-CoV-2 target nucleic acids are NOT DETECTED.  The SARS-CoV-2 RNA is generally detectable in upper respiratory specimens during the acute phase of infection. The lowest concentration of SARS-CoV-2 viral copies this assay can detect is 138 copies/mL. A negative result does not preclude SARS-Cov-2 infection and should not be used as the sole basis for treatment or other patient management decisions. A  negative result may occur with  improper specimen collection/handling, submission of specimen other than nasopharyngeal swab, presence of viral mutation(s) within the areas targeted by this assay, and inadequate number of viral copies(<138 copies/mL). A negative result must be combined with clinical observations, patient history, and epidemiological information. The expected  result is Negative.  Fact Sheet for Patients:  EntrepreneurPulse.com.au  Fact Sheet for Healthcare Providers:  IncredibleEmployment.be  This test is no t yet approved or cleared by the [place] FDA and  has been authorized for detection and/or diagnosis of SARS-CoV-2 by FDA under an Emergency Use Authorization (EUA). This EUA will remain  in effect (meaning this test can be used) for the duration of the COVID-19 declaration under Section 564(b)(1) of the Act, 21 U.S.C.section 360bbb-3(b)(1), unless the authorization is terminated  or revoked sooner.       Influenza A by PCR NEGATIVE NEGATIVE Final   Influenza B by PCR NEGATIVE NEGATIVE Final    Comment: (NOTE) The Xpert Xpress SARS-CoV-2/FLU/RSV plus assay is intended as an aid in the diagnosis of influenza from Nasopharyngeal swab specimens and should not be used as a sole basis for treatment. Nasal washings and aspirates are unacceptable for Xpert Xpress SARS-CoV-2/FLU/RSV testing.  Fact Sheet for Patients: EntrepreneurPulse.com.au  Fact Sheet for Healthcare Providers: IncredibleEmployment.be  This test is not yet approved or cleared by the [place] FDA and has been authorized for detection and/or diagnosis of SARS-CoV-2 by FDA under an Emergency Use Authorization (EUA). This EUA will remain in effect (meaning this test can be used) for the duration of the COVID-19 declaration under Section 564(b)(1) of the Act, 21 U.S.C. section 360bbb-3(b)(1), unless the authorization is terminated or revoked.     Resp Syncytial Virus by PCR NEGATIVE NEGATIVE Final    Comment: (NOTE) Fact Sheet for Patients: EntrepreneurPulse.com.au  Fact Sheet for Healthcare Providers: IncredibleEmployment.be  This test is not yet approved or cleared by the [place] FDA and has been authorized for detection and/or diagnosis of  SARS-CoV-2 by FDA under an Emergency Use Authorization (EUA). This EUA will remain in effect (meaning this test can be used) for the duration of the COVID-19 declaration under Section 564(b)(1) of the Act, 21 U.S.C. section 360bbb-3(b)(1), unless the authorization is terminated or revoked.  Performed at [facility], [address] [place], [place] [address]     Coagulation Studies: No results for input(s): "LABPROT", "INR" in the last 72 hours.  Urinalysis: No results for input(s): "COLORURINE", "LABSPEC", "PHURINE", "GLUCOSEU", "HGBUR", "BILIRUBINUR", "KETONESUR", "PROTEINUR", "UROBILINOGEN", "NITRITE", "LEUKOCYTESUR" in the last 72 hours.  Invalid input(s): "APPERANCEUR"    Imaging: ECHOCARDIOGRAM LIMITED  Result Date: 04/12/2022    ECHOCARDIOGRAM LIMITED REPORT   Patient Name:   SMAYAN HACKBART Date of Exam: 04/12/2022 Medical Rec #:  [id number]           Height:       67.0 in Accession #:    [id number]          Weight:       172.0 lb Date of Birth:  1950-07-15           BSA:          1.897 m Patient Age:    38 years            BP:           99/75 mmHg Patient Gender: M  HR:           65 bpm. Exam Location:  [facility] Procedure: Limited Echo, Limited Color Doppler and Cardiac Doppler Indications:     I35.0 Aortic stenosis  History:         Patient has prior history of Echocardiogram examinations, most                  recent 03/14/2022. CKD; Risk Factors:Hypertension, Diabetes and                  Dyslipidemia.  Sonographer:     Charmayne Sheer Referring Phys:  [id number] [place] TANG Diagnosing Phys: Isaias Cowman MD IMPRESSIONS  1. Left ventricular ejection fraction, by estimation, is <20%. The left ventricle has severely decreased function. The left ventricular internal cavity size was moderately dilated.  2. Mild mitral valve regurgitation.  3. Moderate to severe aortic valve stenosis. FINDINGS  Left Ventricle: Left ventricular ejection fraction, by  estimation, is <20%. The left ventricle has severely decreased function. The left ventricular internal cavity size was moderately dilated. Mitral Valve: Mild mitral valve regurgitation. Tricuspid Valve: Tricuspid valve regurgitation is mild. Aortic Valve: Moderate to severe aortic stenosis is present. Aortic valve mean gradient measures 36.0 mmHg. Aortic valve peak gradient measures 61.5 mmHg. Aortic valve area, by VTI measures 0.56 cm. Additional Comments: Spectral Doppler performed. Color Doppler performed.  LEFT VENTRICLE PLAX 2D LVIDd:         7.00 cm LVIDs:         6.50 cm LV PW:         1.10 cm LV IVS:        0.90 cm LVOT diam:     2.30 cm LV SV:         51 LV SV Index:   27 LVOT Area:     4.15 cm  LV Volumes (MOD) LV vol d, MOD A4C: 182.0 ml LV vol s, MOD A4C: 135.0 ml LV SV MOD A4C:     182.0 ml LEFT ATRIUM         Index LA diam:    5.40 cm 2.85 cm/m  AORTIC VALVE AV Area (Vmax):    0.63 cm AV Area (Vmean):   0.59 cm AV Area (VTI):     0.56 cm AV Vmax:           392.00 cm/s AV Vmean:          285.000 cm/s AV VTI:            0.918 m AV Peak Grad:      61.5 mmHg AV Mean Grad:      36.0 mmHg LVOT Vmax:         59.90 cm/s LVOT Vmean:        40.300 cm/s LVOT VTI:          0.123 m LVOT/AV VTI ratio: 0.13  AORTA Ao Root diam: 3.30 cm  SHUNTS Systemic VTI:  0.12 m Systemic Diam: 2.30 cm Isaias Cowman MD Electronically signed by Isaias Cowman MD Signature Date/Time: 04/12/2022/3:48:44 PM    Final      Medications:    dialysis solution 1.5% low-MG/low-CA     dialysis solution 2.5% low-MG/low-CA      aspirin EC  81 mg Oral Daily   calcitRIOL  0.25 mcg Oral Daily   cinacalcet  30 mg Oral Q supper   ezetimibe  10 mg Oral Daily   furosemide  40 mg Oral Daily   gabapentin  300 mg Oral QHS   gentamicin cream  1 Application Topical Daily   heparin injection (subcutaneous)  5,000 Units Subcutaneous Q8H   insulin aspart  0-5 Units Subcutaneous QHS   insulin aspart  0-9 Units Subcutaneous TID  WC   loratadine  10 mg Oral Daily   midodrine  10 mg Oral TID with meals   multivitamin  1 tablet Oral QHS   omega-3 acid ethyl esters  3 g Oral Daily   pantoprazole  40 mg Oral BID AC   acetaminophen **OR** acetaminophen, magnesium hydroxide, ondansetron **OR** ondansetron (ZOFRAN) IV, traZODone  Assessment/ Plan:  Mr. FRANCOIS ELK is a 71 y.o.  male with past medical conditions including GERD, hypertension, dyslipidemia, anemia, osteoarthritis, and end-stage renal disease on peritoneal dialysis.  Patient presents to the emergency department with complaints of increased shortness of breath and lower extremity edema.  Patient has been admitted for SOB (shortness of breath) [R06.02] Fluid overload [E87.70] Hypotension, unspecified hypotension type [I95.9] Acute on chronic HFrEF (heart failure with reduced ejection fraction) (HCC) [I50.23]   End-stage renal disease on peritoneal dialysis.  Patient with significant lower extremity edema up to upper thighs.  Denies missing recent treatments.    Patient received a daytime treatment yesterday, tolerated well.  Due to continued presence of edema with hypotension, will provide a 3-hour dwell today with 2.5% dialysate.  Patient encouraged to continue nightly peritoneal dialysis treatments at home with 1.5 and 2.5% dialysate.  Will prescribe furosemide 40 mg twice daily.  Will offer close follow-up in office.  Patient encouraged to follow-up with cardiology.  Patient is cleared to discharge from renal stance.  2. Anemia of chronic kidney disease Lab Results  Component Value Date   HGB 9.8 (L) 04/13/2022  Hemoglobin at goal.  3.  Hypotension, etiology unclear. Continue midodrine  4.  Acute on chronic systolic heart failure.  Echo from 03/14/2022 shows EF 25 to 30% with a severe mitral and moderate tricuspid valve regurgitation.  Recent ECHO shows EF <20.  Cardiology recommending follow-up at tertiary center for treatment recommendations.    LOS: 2   12/21/20233:25 PM

## 2022-04-13 NOTE — Progress Notes (Signed)
Patient states his Blood glucose is 82

## 2022-04-13 NOTE — Plan of Care (Signed)

## 2022-04-13 NOTE — Assessment & Plan Note (Signed)
Most likely due to volume overload.  Patient did receive 500 cc IV fluid in ED. -Sodium remained stable at 129 -Fluid restriction with possible diuresis or extra ultrafiltrate -Monitor sodium

## 2022-04-13 NOTE — Progress Notes (Signed)
Patient states his blood glucose is 81 per implanted monitor.

## 2022-04-13 NOTE — Assessment & Plan Note (Signed)
Patient is on peritoneal dialysis.  Nephrology is on board. -Continue with peritoneal dialysis-as advised by nephrology

## 2022-04-13 NOTE — Assessment & Plan Note (Signed)
Resolved.  Continue to monitor.

## 2022-04-13 NOTE — Progress Notes (Signed)
[facility] Cardiology  SUBJECTIVE: Patient sitting on side of the bed, eating breakfast, denies shortness of breath, reports persistent peripheral edema   Vitals:   04/12/22 1400 04/12/22 1615 04/12/22 2356 04/13/22 0607  BP: 95/74 96/72 (!) 87/60   Pulse: 65 71 71   Resp: '16 18 18   '$ Temp: 97.9 F (36.6 C) 97.9 F (36.6 C) 98 F (36.7 C)   TempSrc:      SpO2: 100% 100% 99%   Weight:    88.4 kg  Height:         Intake/Output Summary (Last 24 hours) at 04/13/2022 5188 Last data filed at 04/13/2022 0600 Gross per 24 hour  Intake 360 ml  Output 0 ml  Net 360 ml      PHYSICAL EXAM  General: Well developed, well nourished, in no acute distress HEENT:  Normocephalic and atramatic Neck:  No JVD.  Lungs: Clear bilaterally to auscultation and percussion. Heart: HRRR . Normal S1 and S2 without gallops or murmurs.  Abdomen: Bowel sounds are positive, abdomen soft and non-tender  Msk:  Back normal, normal gait. Normal strength and tone for age. Extremities: No clubbing, cyanosis or edema.   Neuro: Alert and oriented X 3. Psych:  Good affect, responds appropriately   LABS: Basic Metabolic Panel: Recent Labs    04/11/22 0443 04/13/22 0246  NA 129* 129*  K 3.3* 4.9  CL 94* 92*  CO2 24 24  GLUCOSE 81 95  BUN 52* 58*  CREATININE 5.74* 6.07*  CALCIUM 7.4* 8.0*   Liver Function Tests: Recent Labs    04/11/22 1156  ALBUMIN 1.7*   No results for input(s): "LIPASE", "AMYLASE" in the last 72 hours. CBC: Recent Labs    04/11/22 0443 04/13/22 0246  WBC 7.7 10.6*  HGB 9.6* 9.8*  HCT 28.3* 29.6*  MCV 91.6 91.4  PLT 241 245   Cardiac Enzymes: No results for input(s): "CKTOTAL", "CKMB", "CKMBINDEX", "TROPONINI" in the last 72 hours. BNP: Invalid input(s): "POCBNP" D-Dimer: No results for input(s): "DDIMER" in the last 72 hours. Hemoglobin A1C: No results for input(s): "HGBA1C" in the last 72 hours. Fasting Lipid Panel: No results for input(s): "CHOL", "HDL",  "LDLCALC", "TRIG", "CHOLHDL", "LDLDIRECT" in the last 72 hours. Thyroid Function Tests: No results for input(s): "TSH", "T4TOTAL", "T3FREE", "THYROIDAB" in the last 72 hours.  Invalid input(s): "FREET3" Anemia Panel: No results for input(s): "VITAMINB12", "FOLATE", "FERRITIN", "TIBC", "IRON", "RETICCTPCT" in the last 72 hours.  ECHOCARDIOGRAM LIMITED  Result Date: 04/12/2022    ECHOCARDIOGRAM LIMITED REPORT   Patient Name:   Roger Knight Date of Exam: 04/12/2022 Medical Rec #:  [id number]           Height:       67.0 in Accession #:    [id number]          Weight:       172.0 lb Date of Birth:  [birth year]           BSA:          1.897 m Patient Age:    71 years            BP:           99/75 mmHg Patient Gender: M                   HR:           65 bpm. Exam Location:  [facility] Procedure: Limited Echo, Limited Color Doppler and Cardiac Doppler Indications:  I35.0 Aortic stenosis  History:         Patient has prior history of Echocardiogram examinations, most                  recent 03/14/2022. CKD; Risk Factors:Hypertension, Diabetes and                  Dyslipidemia.  Sonographer:     Charmayne Sheer Referring Phys:  [id number] [place] TANG Diagnosing Phys: Isaias Cowman MD IMPRESSIONS  1. Left ventricular ejection fraction, by estimation, is <20%. The left ventricle has severely decreased function. The left ventricular internal cavity size was moderately dilated.  2. Mild mitral valve regurgitation.  3. Moderate to severe aortic valve stenosis. FINDINGS  Left Ventricle: Left ventricular ejection fraction, by estimation, is <20%. The left ventricle has severely decreased function. The left ventricular internal cavity size was moderately dilated. Mitral Valve: Mild mitral valve regurgitation. Tricuspid Valve: Tricuspid valve regurgitation is mild. Aortic Valve: Moderate to severe aortic stenosis is present. Aortic valve mean gradient measures 36.0 mmHg. Aortic valve peak gradient measures 61.5  mmHg. Aortic valve area, by VTI measures 0.56 cm. Additional Comments: Spectral Doppler performed. Color Doppler performed.  LEFT VENTRICLE PLAX 2D LVIDd:         7.00 cm LVIDs:         6.50 cm LV PW:         1.10 cm LV IVS:        0.90 cm LVOT diam:     2.30 cm LV SV:         51 LV SV Index:   27 LVOT Area:     4.15 cm  LV Volumes (MOD) LV vol d, MOD A4C: 182.0 ml LV vol s, MOD A4C: 135.0 ml LV SV MOD A4C:     182.0 ml LEFT ATRIUM         Index LA diam:    5.40 cm 2.85 cm/m  AORTIC VALVE AV Area (Vmax):    0.63 cm AV Area (Vmean):   0.59 cm AV Area (VTI):     0.56 cm AV Vmax:           392.00 cm/s AV Vmean:          285.000 cm/s AV VTI:            0.918 m AV Peak Grad:      61.5 mmHg AV Mean Grad:      36.0 mmHg LVOT Vmax:         59.90 cm/s LVOT Vmean:        40.300 cm/s LVOT VTI:          0.123 m LVOT/AV VTI ratio: 0.13  AORTA Ao Root diam: 3.30 cm  SHUNTS Systemic VTI:  0.12 m Systemic Diam: 2.30 cm Isaias Cowman MD Electronically signed by Isaias Cowman MD Signature Date/Time: 04/12/2022/3:48:44 PM    Final    US Abdomen Limited RUQ (LIVER/GB)  Result Date: 04/11/2022 CLINICAL DATA:  Shortness of breath EXAM: ULTRASOUND ABDOMEN LIMITED RIGHT UPPER QUADRANT COMPARISON:  None Available. FINDINGS: Gallbladder: Mobile gallstones in the gallbladder. Gallbladder wall thickening up to 0.6 cm. No pericholecystic fluid. No sonographic Murphy sign noted by sonographer. Common bile duct: Diameter: 0.5 cm Liver: No focal lesion identified. Within normal limits in parenchymal echogenicity. Portal vein is patent on color Doppler imaging with normal direction of blood flow towards the liver. Other: Incidental note of right pleural effusion. Simple appearing right renal cyst measuring  8.1 cm, benign, for which no specific further follow-up or characterization is required. IMPRESSION: 1. Mobile gallstones in the gallbladder. Gallbladder wall thickening up to 0.6 cm. No pericholecystic fluid or sonographic  Murphy sign. Findings are concerning for acute cholecystitis. Consider further evaluation with nuclear medicine HIDA scan to evaluate for patency of the cystic and common bile ducts. 2. Incidental note of right pleural effusion. Electronically Signed   By: Delanna Ahmadi M.D.   On: 04/11/2022 14:09     Echo LVEF less than 20%, severe aortic stenosis, calculated aortic valve area 0.63 cm, peak velocity 3.92 m/s, peak gradient 61.5 mmHg, mean gradient 36.0 mmHg  TELEMETRY: Sinus rhythm with left bundle branch block:  ASSESSMENT AND PLAN:  Principal Problem:   Acute on chronic HFrEF (heart failure with reduced ejection fraction) (HCC) Active Problems:   Coronary artery disease   Dyslipidemia   Hyponatremia   Type 2 diabetes mellitus with peripheral neuropathy (HCC)   Hypokalemia   Hypotension   ESRD on peritoneal dialysis ([place])    1.  Acute on chronic systolic congestive heart failure, HFrEF, EF <52% 77/82/4235 complicated by underlying ESRD on PD and moderate to severe aortic stenosis, GDMT limited by baseline low blood pressure, requiring midodrine during peritoneal dialysis 2.  CAD, status post CABG 1996, only denies chest pain, borderline elevated troponin, without delta (95, 108, 82, 86) 3.  Moderate to severe aortic stenosis, in the setting of ischemic cardiomyopathy, low cardiac output 4.  ESRD, on peritoneal dialysis  Recommendations  1.  Agree with current therapy 2.  Further recommendations at this time 3.  Await peritoneal dialysis 4.  Patient scheduled to see [name] 04/27/2022 at [facility] for advanced heart failure management.  Patient may be a candidate for TAVR and possibly CRT-D 5.  Patient has scheduled follow-up with me 04/28/2022 after evaluation at [facility], MD, PhD, [facility] 04/13/2022 9:04 AM

## 2022-04-13 NOTE — Progress Notes (Signed)
Progress Note   Patient: Roger Knight:[id number] DOB: 14-Jun-1950 DOA: 04/11/2022     2 DOS: the patient was seen and examined on 04/13/2022   Brief hospital course: Taken from H&P.  [name] is a 71 y.o. Caucasian male with medical history significant for osteoarthritis, anemia, end-stage renal disease on peritoneal dialysis, GERD, hypertension, HFrEF(20 to 25%) and dyslipidemia, who presented to the ER with acute onset of worsening dyspnea with associated dyspnea on exertion and lower extremity edema without paroxysmal nocturnal dyspnea or orthopnea.  He denied any fever or chills.  No cough or wheezing.  No chest pain or palpitations.   ED Course: When he came to the ER BP was 84/62 and later 113/63 with heart rate of 59 and later 60.  Labs revealed hyponatremia and hypochloremia with hypokalemia and blood glucose of 161 BUN of 52 and creatinine 5.95.  BNP was more than 4500 and high sensitive troponin I was 95 and later 108. EKG as reviewed by me : EKG showed sinus rhythm with a rate of 60 with frequent PVCs in a pattern of bigeminy with nothing specific interventricular conduction delay and Imaging: Two-view chest x-ray showed the following: Small bilateral pleural effusions, right greater than left, with dense right basilar consolidation and patchy left basilar opacities favoring atelectasis, infection is possible. Nephrology was consulted.  12/19: Blood pressure remained soft at 88/59, sodium 129, potassium 3.3, creatinine 5.74.  COVID-19, influenza and RSV negative.  Significant lower extremity edema.  Urinary output has been decreased significantly recently.  Softer blood pressure is a barrier for more fluid removal. Nephrology is trying to adjust dialysis and his medications, they are recommending to involve cardiology.  Patient follow-up with [facility]: [facility] cardiology- [name] notified. Patient might need some extra pressure support for effective  diuresis.  12/20: Vital stable.  No urinary output recorded but per patient he had a large urinary output this morning.  He does not urinate very frequently.  No shortness of breath.  Lower extremity edema seems little improved than before.  Repeat limited echocardiogram with EF of less than 20% and severely dilated cardiomegaly-pending cardiology consult.  12/21: Blood pressure remained soft.  No urinary output recorded.   Labs with borderline leukocytosis at 10.6, hemoglobin stable at 9.8, sodium stable at 129 and renal function consistent with ESRD.  Cardiology saw the patient who has worsening of HFrEF with EF of 20% and moderate to severe aortic stenosis.  Per cardiology note he has an appointment with [name] at [facility] advanced heart failure on January 4th for consideration of advanced therapies. Would also recommend consideration of TAVR at that time. -Low blood pressures preclude the addition of GDMT unfortunately, he was recently taken off of spironolactone and has required midodrine to support his blood pressure during dialysis. Cardiology does not think that the addition of any inotrope is needed at this time and they are recommending follow-up with advanced heart failure clinic at [facility] for further recommendations. Nephrology would like to try draining more fluid with another session of dialysis.  Patient is high risk for deterioration and mortality based on underlying comorbidities and worsening heart failure.  Assessment and Plan: * Acute on chronic HFrEF (heart failure with reduced ejection fraction) (HCC) Recent echocardiogram done on 03/14/2022 with EF of 25 to 30% with moderate to severe mitral and tricuspid regurgitation.  Blood pressure soft which is a barrier for extra fluid removal.  Nephrology is trying to adjust dialysis and medication. Markedly elevated BNP  with significant lower extremity edema. Repeat echocardiogram with worsening of EF to less than 20% and severely  dilated cardiomyopathy. -Cardiology does not think that he need any inotropic support, they referred him to advance heart failure clinic at [facility]  Appointment was set up for 04/27/2022 -Daily weight and BMP -Strict intake and output  Hypokalemia Resolved. -Continue to monitor  Hyponatremia Most likely due to volume overload.  Patient did receive 500 cc IV fluid in ED. -Sodium remained stable at 129 -Fluid restriction with possible diuresis or extra ultrafiltrate -Monitor sodium  ESRD on peritoneal dialysis [facility]) Patient is on peritoneal dialysis.  Nephrology is on board. -Continue with peritoneal dialysis-as advised by nephrology  Hypotension Blood pressure remained soft. -Continue with midodrine  Type 2 diabetes mellitus with peripheral neuropathy (HCC) Recent A1c of 6.6.  CBG currently within goal. -Sensitive SSI  Dyslipidemia - We will continue Zetia and omega-3 fish oil.  Coronary artery disease Mildly elevated troponin which peaked at 108 with downward trend, most likely secondary to demand supply mismatch with fluid overload.  No chest pain - We will continue aspirin.   Subjective: Patient complaining of right upper forearm edema and mild erythema.  Patient had a dog scratch in that area. Swelling and redness started last night.  Physical Exam: Vitals:   04/12/22 2356 04/13/22 0607 04/13/22 0937 04/13/22 1514  BP: (!) 87/60  98/68 (!) 102/59  Pulse: 71  71 82  Resp: '18  17 18  '$ Temp: 98 F (36.7 C)  97.6 F (36.4 C) 98.3 F (36.8 C)  TempSrc:   Oral Oral  SpO2: 99%  99% 99%  Weight:  88.4 kg    Height:       General.  Frail elderly man, in no acute distress. Pulmonary.  Lungs clear bilaterally, normal respiratory effort. CV.  Regular rate and rhythm, no JVD, rub or murmur. Abdomen.  Soft, nontender, nondistended, BS positive. CNS.  Alert and oriented .  No focal neurologic deficit. Extremities.  2+ LE edema, no cyanosis, pulses intact and symmetrical.   Right forearm with mild erythema, edema and hyperthermia Psychiatry.  Judgment and insight appears normal.   Data Reviewed: Prior data reviewed  Family Communication: Discussed with daughter at bedside  Disposition: Status is: Inpatient Remains inpatient appropriate because: Severity of illness  Planned Discharge Destination: Home  DVT prophylaxis.  Subcu heparin Time spent: 50 minutes  This record has been created using Systems analyst. Errors have been sought and corrected,but may not always be located. Such creation errors do not reflect on the standard of care.   Author: Lorella Nimrod, MD 04/13/2022 5:18 PM  For on call review [email or web address].

## 2022-04-14 DIAGNOSIS — E871 Hypo-osmolality and hyponatremia: Secondary | ICD-10-CM | POA: Diagnosis not present

## 2022-04-14 DIAGNOSIS — I959 Hypotension, unspecified: Secondary | ICD-10-CM | POA: Diagnosis not present

## 2022-04-14 DIAGNOSIS — I5023 Acute on chronic systolic (congestive) heart failure: Secondary | ICD-10-CM | POA: Diagnosis not present

## 2022-04-14 DIAGNOSIS — D509 Iron deficiency anemia, unspecified: Secondary | ICD-10-CM | POA: Diagnosis not present

## 2022-04-14 DIAGNOSIS — Z992 Dependence on renal dialysis: Secondary | ICD-10-CM | POA: Diagnosis not present

## 2022-04-14 DIAGNOSIS — D631 Anemia in chronic kidney disease: Secondary | ICD-10-CM | POA: Diagnosis not present

## 2022-04-14 DIAGNOSIS — N186 End stage renal disease: Secondary | ICD-10-CM | POA: Diagnosis not present

## 2022-04-14 LAB — BASIC METABOLIC PANEL
Anion gap: 13 (ref 5–15)
BUN: 61 mg/dL — ABNORMAL HIGH (ref 8–23)
CO2: 23 mmol/L (ref 22–32)
Calcium: 8 mg/dL — ABNORMAL LOW (ref 8.9–10.3)
Chloride: 92 mmol/L — ABNORMAL LOW (ref 98–111)
Creatinine, Ser: 6.33 mg/dL — ABNORMAL HIGH (ref 0.61–1.24)
GFR, Estimated: 9 mL/min — ABNORMAL LOW (ref >60)
Glucose, Bld: 151 mg/dL — ABNORMAL HIGH (ref 70–99)
Potassium: 5.1 mmol/L (ref 3.5–5.1)
Sodium: 128 mmol/L — ABNORMAL LOW (ref 135–145)

## 2022-04-14 MED ORDER — DOXYCYCLINE HYCLATE 100 MG PO TABS: 100.0000 mg | ORAL_TABLET | Freq: Two times a day (BID) | ORAL | 0 refills | Status: AC

## 2022-04-14 MED ORDER — FUROSEMIDE 40 MG PO TABS: 40.0000 mg | ORAL_TABLET | Freq: Every day | ORAL | 1 refills | Status: AC

## 2022-04-14 NOTE — Plan of Care (Signed)

## 2022-04-14 NOTE — Discharge Summary (Signed)
Physician Discharge Summary   Patient: Roger Knight MRN: [id number] DOB: 29-May-1950  Admit date:     04/11/2022  Discharge date: 04/14/22  Discharge Physician: Lorella Nimrod   PCP: Leonel Ramsay, MD   Recommendations at discharge:  Follow-up with cardiology Follow-up with nephrology Follow-up with primary care provider  Discharge Diagnoses: Principal Problem:   Acute on chronic HFrEF (heart failure with reduced ejection fraction) ([place]) Active Problems:   Hypokalemia   Hyponatremia   Coronary artery disease   Dyslipidemia   Type 2 diabetes mellitus with peripheral neuropathy (HCC)   Hypotension   ESRD on peritoneal dialysis [facility])   Hospital Course: Taken from H&P.  Roger Knight is a 71 y.o. Caucasian male with medical history significant for osteoarthritis, anemia, end-stage renal disease on peritoneal dialysis, GERD, hypertension, HFrEF(20 to 25%) and dyslipidemia, who presented to the ER with acute onset of worsening dyspnea with associated dyspnea on exertion and lower extremity edema without paroxysmal nocturnal dyspnea or orthopnea.  He denied any fever or chills.  No cough or wheezing.  No chest pain or palpitations.   ED Course: When he came to the ER BP was 84/62 and later 113/63 with heart rate of 59 and later 60.  Labs revealed hyponatremia and hypochloremia with hypokalemia and blood glucose of 161 BUN of 52 and creatinine 5.95.  BNP was more than 4500 and high sensitive troponin I was 95 and later 108. EKG as reviewed by me : EKG showed sinus rhythm with a rate of 60 with frequent PVCs in a pattern of bigeminy with nothing specific interventricular conduction delay and Imaging: Two-view chest x-ray showed the following: Small bilateral pleural effusions, right greater than left, with dense right basilar consolidation and patchy left basilar opacities favoring atelectasis, infection is possible. Nephrology was consulted.  12/19: Blood pressure  remained soft at 88/59, sodium 129, potassium 3.3, creatinine 5.74.  COVID-19, influenza and RSV negative.  Significant lower extremity edema.  Urinary output has been decreased significantly recently.  Softer blood pressure is a barrier for more fluid removal. Nephrology is trying to adjust dialysis and his medications, they are recommending to involve cardiology.  Patient follow-up with [facility] cardiology- [name] notified. Patient might need some extra pressure support for effective diuresis.  12/20: Vital stable.  No urinary output recorded but per patient he had a large urinary output this morning.  He does not urinate very frequently.  No shortness of breath.  Lower extremity edema seems little improved than before.  Repeat limited echocardiogram with EF of less than 20% and severely dilated cardiomegaly-pending cardiology consult.  12/21: Blood pressure remained soft.  No urinary output recorded.   Labs with borderline leukocytosis at 10.6, hemoglobin stable at 9.8, sodium stable at 129 and renal function consistent with ESRD.  Cardiology saw the patient who has worsening of HFrEF with EF of 20% and moderate to severe aortic stenosis.  Per cardiology note he has an appointment with [name] at [facility] advanced heart failure on January 4th for consideration of advanced therapies. Would also recommend consideration of TAVR at that time. -Low blood pressures preclude the addition of GDMT unfortunately, he was recently taken off of spironolactone and has required midodrine to support his blood pressure during dialysis. Cardiology does not think that the addition of any inotrope is needed at this time and they are recommending follow-up with advanced heart failure clinic at [facility] for further recommendations. Nephrology would like to try draining more fluid with  another session of dialysis. Also developed mild erythema, edema and hyperthermia of right upper forearm, had a dog scratched in that  area, start him on doxycycline.  12/22: Hemodynamically stable.  Slight worsening of sodium to 128.  Patient would like to go home.  Nephrology provided him with detailed instructions about his peritoneal dialysis and they will follow-up closely as an outpatient.  Cardiology will also follow-up and he has been given an appointment at advanced heart failure clinic.  Remained volume overload. He is also been started on Lasix to see if that will help.  Per pharmacy record he was not using any of his insulin listed in his chart so it was discontinued.  If he continued to need diabetes management he need to follow-up with primary care provider.  He will continue on current medications and need to have a close follow-up with his providers for further recommendations.  Patient is high risk for deterioration and mortality based on underlying comorbidities and worsening heart failure.  Assessment and Plan: * Acute on chronic HFrEF (heart failure with reduced ejection fraction) (HCC) Recent echocardiogram done on 03/14/2022 with EF of 25 to 30% with moderate to severe mitral and tricuspid regurgitation.  Blood pressure soft which is a barrier for extra fluid removal.  Nephrology is trying to adjust dialysis and medication. Markedly elevated BNP with significant lower extremity edema. Repeat echocardiogram with worsening of EF to less than 20% and severely dilated cardiomyopathy. -Cardiology does not think that he need any inotropic support, they referred him to advance heart failure clinic at [facility].  Appointment was set up for 04/27/2022 -Daily weight and BMP -Strict intake and output  Hypokalemia Resolved. -Continue to monitor  Hyponatremia Most likely due to volume overload.  Patient did receive 500 cc IV fluid in ED. -Sodium remained stable at 129 -Fluid restriction with possible diuresis or extra ultrafiltrate -Monitor sodium  ESRD on peritoneal dialysis [facility]) Patient is on peritoneal dialysis.   Nephrology is on board. -Continue with peritoneal dialysis-as advised by nephrology  Hypotension Blood pressure remained soft. -Continue with midodrine  Type 2 diabetes mellitus with peripheral neuropathy (HCC) Recent A1c of 6.6.  CBG currently within goal. -Sensitive SSI  Dyslipidemia - We will continue Zetia and omega-3 fish oil.  Coronary artery disease Mildly elevated troponin which peaked at 108 with downward trend, most likely secondary to demand supply mismatch with fluid overload.  No chest pain - We will continue aspirin.         Consultants: Cardiology, nephrology Procedures performed: Peritoneal dialysis Disposition: Home Diet recommendation:  Discharge Diet Orders (From admission, onward)     Start     Ordered   04/14/22 0000  Diet - low sodium heart healthy        04/14/22 1043           Cardiac and Carb modified diet DISCHARGE MEDICATION: Allergies as of 04/14/2022   No Known Allergies      Medication List     STOP taking these medications    insulin aspart 100 UNIT/ML injection Commonly known as: novoLOG   Insulin Pen Needle 32G X 4 MM Misc   Tresiba FlexTouch 200 UNIT/ML FlexTouch Pen Generic drug: insulin degludec   triamcinolone cream 0.1 % Commonly known as: KENALOG       TAKE these medications    aspirin 81 MG tablet Take 81 mg by mouth daily.   calcitRIOL 0.25 MCG capsule Commonly known as: ROCALTROL Take 0.25 mcg by mouth daily.  cetirizine 10 MG tablet Commonly known as: ZYRTEC Take 10 mg by mouth daily.   cinacalcet 30 MG tablet Commonly known as: SENSIPAR Take 30 mg by mouth daily with supper.   doxycycline 100 MG tablet Commonly known as: VIBRA-TABS Take 1 tablet (100 mg total) by mouth every 12 (twelve) hours for 6 days.   ezetimibe 10 MG tablet Commonly known as: ZETIA Take 10 mg by mouth daily.   Fiber 625 MG Tabs Take 2 capsules by mouth in the morning and at bedtime.   furosemide 40 MG  tablet Commonly known as: LASIX Take 1 tablet (40 mg total) by mouth daily. Start taking on: 04-17-22   gabapentin 300 MG capsule Commonly known as: NEURONTIN Take 300 mg by mouth at bedtime.   midodrine 10 MG tablet Commonly known as: PROAMATINE Take 10 mg by mouth 3 (three) times daily.   multivitamin with minerals tablet Take 1 tablet by mouth daily.   nitroGLYCERIN 0.4 MG SL tablet Commonly known as: NITROSTAT Place 0.4 mg under the tongue every 5 (five) minutes as needed for chest pain.   omega-3 fish oil 1000 MG Caps capsule Commonly known as: MAXEPA Take 3 capsules by mouth daily.   pantoprazole 40 MG tablet Commonly known as: PROTONIX Take 40 mg by mouth 2 (two) times daily before a meal.   sildenafil 100 MG tablet Commonly known as: VIAGRA Take 100 mg by mouth as needed for erectile dysfunction. Reported on 06/14/2015               Discharge Care Instructions  (From admission, onward)           Start     Ordered   04/14/22 0000  Discharge wound care:       Comments: Clean skin near exit site with chloraprep swab sticks.  Starting at catheter, use circular pattern around exit site, moving towards outer edges of area covered by dressing.  Apply gentamicin cream to site once daily.  Cover with dry dressing.   04/14/22 1043            Follow-up Information     Leonel Ramsay, MD. Schedule an appointment as soon as possible for a visit in 1 week(s).   Specialty: Infectious Diseases Contact information: [facility] [place] [address] [phone]                Discharge Exam: Danley Danker Weights   04/10/22 1435 04/13/22 0607 04/14/22 0500  Weight: 78 kg 88.4 kg 88.3 kg   General.     In no acute distress. Pulmonary.  Lungs clear bilaterally, normal respiratory effort. CV.  Regular rate and rhythm, no JVD, rub or murmur. Abdomen.  Soft, nontender, nondistended, BS positive. CNS.  Alert and  oriented .  No focal neurologic deficit. Extremities.  2+ LE edema, no cyanosis, pulses intact and symmetrical. Psychiatry.  Judgment and insight appears normal.   Condition at discharge: stable  The results of significant diagnostics from this hospitalization (including imaging, microbiology, ancillary and laboratory) are listed below for reference.   Imaging Studies: ECHOCARDIOGRAM LIMITED  Result Date: 04/12/2022    ECHOCARDIOGRAM LIMITED REPORT   Patient Name:   NICOLAE VASEK Date of Exam: 04/12/2022 Medical Rec #:  [id number]           Height:       67.0 in Accession #:    [id number]          Weight:  172.0 lb Date of Birth:  [birth year]           BSA:          1.897 m Patient Age:    79 years            BP:           99/75 mmHg Patient Gender: M                   HR:           65 bpm. Exam Location:  [facility] Procedure: Limited Echo, Limited Color Doppler and Cardiac Doppler Indications:     I35.0 Aortic stenosis  History:         Patient has prior history of Echocardiogram examinations, most                  recent 03/14/2022. CKD; Risk Factors:Hypertension, Diabetes and                  Dyslipidemia.  Sonographer:     Charmayne Sheer Referring Phys:  [id number] [place] TANG Diagnosing Phys: Isaias Cowman MD IMPRESSIONS  1. Left ventricular ejection fraction, by estimation, is <20%. The left ventricle has severely decreased function. The left ventricular internal cavity size was moderately dilated.  2. Mild mitral valve regurgitation.  3. Moderate to severe aortic valve stenosis. FINDINGS  Left Ventricle: Left ventricular ejection fraction, by estimation, is <20%. The left ventricle has severely decreased function. The left ventricular internal cavity size was moderately dilated. Mitral Valve: Mild mitral valve regurgitation. Tricuspid Valve: Tricuspid valve regurgitation is mild. Aortic Valve: Moderate to severe aortic stenosis is present. Aortic valve mean gradient measures 36.0 mmHg.  Aortic valve peak gradient measures 61.5 mmHg. Aortic valve area, by VTI measures 0.56 cm. Additional Comments: Spectral Doppler performed. Color Doppler performed.  LEFT VENTRICLE PLAX 2D LVIDd:         7.00 cm LVIDs:         6.50 cm LV PW:         1.10 cm LV IVS:        0.90 cm LVOT diam:     2.30 cm LV SV:         51 LV SV Index:   27 LVOT Area:     4.15 cm  LV Volumes (MOD) LV vol d, MOD A4C: 182.0 ml LV vol s, MOD A4C: 135.0 ml LV SV MOD A4C:     182.0 ml LEFT ATRIUM         Index LA diam:    5.40 cm 2.85 cm/m  AORTIC VALVE AV Area (Vmax):    0.63 cm AV Area (Vmean):   0.59 cm AV Area (VTI):     0.56 cm AV Vmax:           392.00 cm/s AV Vmean:          285.000 cm/s AV VTI:            0.918 m AV Peak Grad:      61.5 mmHg AV Mean Grad:      36.0 mmHg LVOT Vmax:         59.90 cm/s LVOT Vmean:        40.300 cm/s LVOT VTI:          0.123 m LVOT/AV VTI ratio: 0.13  AORTA Ao Root diam: 3.30 cm  SHUNTS Systemic VTI:  0.12 m Systemic Diam: 2.30 cm Isaias Cowman MD Electronically signed  by Isaias Cowman MD Signature Date/Time: 04/12/2022/3:48:44 PM    Final    US Abdomen Limited RUQ (LIVER/GB)  Result Date: 04/11/2022 CLINICAL DATA:  Shortness of breath EXAM: ULTRASOUND ABDOMEN LIMITED RIGHT UPPER QUADRANT COMPARISON:  None Available. FINDINGS: Gallbladder: Mobile gallstones in the gallbladder. Gallbladder wall thickening up to 0.6 cm. No pericholecystic fluid. No sonographic Murphy sign noted by sonographer. Common bile duct: Diameter: 0.5 cm Liver: No focal lesion identified. Within normal limits in parenchymal echogenicity. Portal vein is patent on color Doppler imaging with normal direction of blood flow towards the liver. Other: Incidental note of right pleural effusion. Simple appearing right renal cyst measuring 8.1 cm, benign, for which no specific further follow-up or characterization is required. IMPRESSION: 1. Mobile gallstones in the gallbladder. Gallbladder wall thickening up to 0.6  cm. No pericholecystic fluid or sonographic Murphy sign. Findings are concerning for acute cholecystitis. Consider further evaluation with nuclear medicine HIDA scan to evaluate for patency of the cystic and common bile ducts. 2. Incidental note of right pleural effusion. Electronically Signed   By: Delanna Ahmadi M.D.   On: 04/11/2022 14:09   DG Chest 2 View  Result Date: 04/10/2022 CLINICAL DATA:  Shortness of breath EXAM: CHEST - 2 VIEW COMPARISON:  Radiograph 03/13/2022 FINDINGS: Unchanged cardiomediastinal silhouette with postsurgical changes of CABG. There are small bilateral pleural effusions, right greater than left. There is a dense right basilar consolidation and patchy left basilar airspace opacities. No evidence of pneumothorax. No acute osseous abnormality. IMPRESSION: Small bilateral pleural effusions, right greater than left, with dense right basilar consolidation and patchy left basilar opacities favoring atelectasis, infection is possible. Electronically Signed   By: Maurine Simmering M.D.   On: 04/10/2022 15:14    Microbiology: Results for orders placed or performed during the hospital encounter of 04/11/22  Resp panel by RT-PCR (RSV, Flu A&B, Covid) Anterior Nasal Swab     Status: None   Collection Time: 04/10/22 11:00 PM   Specimen: Anterior Nasal Swab  Result Value Ref Range Status   SARS Coronavirus 2 by RT PCR NEGATIVE NEGATIVE Final    Comment: (NOTE) SARS-CoV-2 target nucleic acids are NOT DETECTED.  The SARS-CoV-2 RNA is generally detectable in upper respiratory specimens during the acute phase of infection. The lowest concentration of SARS-CoV-2 viral copies this assay can detect is 138 copies/mL. A negative result does not preclude SARS-Cov-2 infection and should not be used as the sole basis for treatment or other patient management decisions. A negative result may occur with  improper specimen collection/handling, submission of specimen other than nasopharyngeal swab,  presence of viral mutation(s) within the areas targeted by this assay, and inadequate number of viral copies(<138 copies/mL). A negative result must be combined with clinical observations, patient history, and epidemiological information. The expected result is Negative.  Fact Sheet for Patients:  EntrepreneurPulse.com.au  Fact Sheet for Healthcare Providers:  IncredibleEmployment.be  This test is no t yet approved or cleared by the [place] FDA and  has been authorized for detection and/or diagnosis of SARS-CoV-2 by FDA under an Emergency Use Authorization (EUA). This EUA will remain  in effect (meaning this test can be used) for the duration of the COVID-19 declaration under Section 564(b)(1) of the Act, 21 U.S.C.section 360bbb-3(b)(1), unless the authorization is terminated  or revoked sooner.       Influenza A by PCR NEGATIVE NEGATIVE Final   Influenza B by PCR NEGATIVE NEGATIVE Final    Comment: (NOTE) The Xpert Xpress SARS-CoV-2/FLU/RSV  plus assay is intended as an aid in the diagnosis of influenza from Nasopharyngeal swab specimens and should not be used as a sole basis for treatment. Nasal washings and aspirates are unacceptable for Xpert Xpress SARS-CoV-2/FLU/RSV testing.  Fact Sheet for Patients: EntrepreneurPulse.com.au  Fact Sheet for Healthcare Providers: IncredibleEmployment.be  This test is not yet approved or cleared by the [place] FDA and has been authorized for detection and/or diagnosis of SARS-CoV-2 by FDA under an Emergency Use Authorization (EUA). This EUA will remain in effect (meaning this test can be used) for the duration of the COVID-19 declaration under Section 564(b)(1) of the Act, 21 U.S.C. section 360bbb-3(b)(1), unless the authorization is terminated or revoked.     Resp Syncytial Virus by PCR NEGATIVE NEGATIVE Final    Comment: (NOTE) Fact Sheet for  Patients: EntrepreneurPulse.com.au  Fact Sheet for Healthcare Providers: IncredibleEmployment.be  This test is not yet approved or cleared by the [place] FDA and has been authorized for detection and/or diagnosis of SARS-CoV-2 by FDA under an Emergency Use Authorization (EUA). This EUA will remain in effect (meaning this test can be used) for the duration of the COVID-19 declaration under Section 564(b)(1) of the Act, 21 U.S.C. section 360bbb-3(b)(1), unless the authorization is terminated or revoked.  Performed at [facility], [address] [place], [place] [address]     Labs: CBC: Recent Labs  Lab 04/10/22 2257 04/11/22 0443 04/13/22 0246  WBC 6.4 7.7 10.6*  HGB 10.5* 9.6* 9.8*  HCT 31.3* 28.3* 29.6*  MCV 91.0 91.6 91.4  PLT 256 241 403   Basic Metabolic Panel: Recent Labs  Lab 04/10/22 2257 04/11/22 0443 04/13/22 0246 04/14/22 0252  NA 128* 129* 129* 128*  K 3.3* 3.3* 4.9 5.1  CL 91* 94* 92* 92*  CO2 '23 24 24 23  '$ GLUCOSE 161* 81 95 151*  BUN 52* 52* 58* 61*  CREATININE 5.95* 5.74* 6.07* 6.33*  CALCIUM 7.4* 7.4* 8.0* 8.0*   Liver Function Tests: Recent Labs  Lab 04/11/22 1156  ALBUMIN 1.7*   CBG: No results for input(s): "GLUCAP" in the last 168 hours.  Discharge time spent: greater than 30 minutes.  This record has been created using Systems analyst. Errors have been sought and corrected,but may not always be located. Such creation errors do not reflect on the standard of care.   Signed: Lorella Nimrod, MD [facility] 04/14/2022

## 2022-04-14 NOTE — Progress Notes (Signed)
Patient PD machine was turned off when this RN arrived to end PD session. Patient said machine was alarming last night. Unsure of what the alarms were. Primary RN assisted patient in turning PD machine off so he could rest. This RN turned PD cycler on and patient has 2 hours of PD tx remaining. In the middle of a dwell cycle. This RN remained in patient room for a few minutes to ensure treatment would continue. No complications. Pt resting comfortably. Instructed patient to notify primary RN to contact [facility] if further complications.

## 2022-04-14 NOTE — Plan of Care (Signed)

## 2022-04-14 NOTE — Progress Notes (Signed)
[facility]  ROUNDING NOTE   Subjective:   Roger Knight is a 71 year old male with past medical conditions including GERD, hypertension, dyslipidemia, anemia, osteoarthritis, and end-stage renal disease on peritoneal dialysis.  Patient presents to the emergency department with complaints of increased shortness of breath and lower extremity edema.  Patient has been admitted for SOB (shortness of breath) [R06.02] Fluid overload [E87.70] Hypotension, unspecified hypotension type [I95.9] Acute on chronic HFrEF (heart failure with reduced ejection fraction) ([place]) [I50.23]  Patient is known to our practice and his peritoneal dialysis is followed outpatient at [facility] by Dr. Holley Raring.    Patient seen sitting at side of bed, daughter at bedside Alert and oriented Remains on room air Lower extremity edema present Continues to complain of abdominal fullness  Objective:  Vital signs in last 24 hours:  Temp:  [98.3 F (36.8 C)-99.7 F (37.6 C)] 98.5 F (36.9 C) (12/22 0824) Pulse Rate:  [79-82] 79 (12/22 0824) Resp:  [17-18] 17 (12/22 0824) BP: (87-102)/(59-71) 91/69 (12/22 0824) SpO2:  [94 %-99 %] 98 % (12/22 0824) Weight:  [88.3 kg] 88.3 kg (12/22 0500)  Weight change: -0.1 kg Filed Weights   04/10/22 1435 04/13/22 0607 04/14/22 0500  Weight: 78 kg 88.4 kg 88.3 kg    Intake/Output: I/O last 3 completed shifts: In: 0  Out: 350 [Urine:350]   Intake/Output this shift:  No intake/output data recorded.  Physical Exam: General: NAD  Head: Normocephalic, atraumatic. Moist oral mucosal membranes  Eyes: Anicteric  Lungs:  Clear to auscultation, normal effort, room air  Heart: Regular rate and rhythm  Abdomen:  Soft, nontender, PD catheter  Extremities: 2+ peripheral edema.  Neurologic: Alert and oriented, moving all four extremities  Skin: No lesions  Access: PD catheter    Basic Metabolic Panel: Recent Labs  Lab 04/10/22 2257 04/11/22 0443  04/13/22 0246 04/14/22 0252  NA 128* 129* 129* 128*  K 3.3* 3.3* 4.9 5.1  CL 91* 94* 92* 92*  CO2 '23 24 24 23  '$ GLUCOSE 161* 81 95 151*  BUN 52* 52* 58* 61*  CREATININE 5.95* 5.74* 6.07* 6.33*  CALCIUM 7.4* 7.4* 8.0* 8.0*     Liver Function Tests: Recent Labs  Lab 04/11/22 1156  ALBUMIN 1.7*    No results for input(s): "LIPASE", "AMYLASE" in the last 168 hours. No results for input(s): "AMMONIA" in the last 168 hours.  CBC: Recent Labs  Lab 04/10/22 2257 04/11/22 0443 04/13/22 0246  WBC 6.4 7.7 10.6*  HGB 10.5* 9.6* 9.8*  HCT 31.3* 28.3* 29.6*  MCV 91.0 91.6 91.4  PLT 256 241 245     Cardiac Enzymes: No results for input(s): "CKTOTAL", "CKMB", "CKMBINDEX", "TROPONINI" in the last 168 hours.  BNP: Invalid input(s): "POCBNP"  CBG: No results for input(s): "GLUCAP" in the last 168 hours.  Microbiology: Results for orders placed or performed during the hospital encounter of 04/11/22  Resp panel by RT-PCR (RSV, Flu A&B, Covid) Anterior Nasal Swab     Status: None   Collection Time: 04/10/22 11:00 PM   Specimen: Anterior Nasal Swab  Result Value Ref Range Status   SARS Coronavirus 2 by RT PCR NEGATIVE NEGATIVE Final    Comment: (NOTE) SARS-CoV-2 target nucleic acids are NOT DETECTED.  The SARS-CoV-2 RNA is generally detectable in upper respiratory specimens during the acute phase of infection. The lowest concentration of SARS-CoV-2 viral copies this assay can detect is 138 copies/mL. A negative result does not preclude SARS-Cov-2 infection and should not be  used as the sole basis for treatment or other patient management decisions. A negative result may occur with  improper specimen collection/handling, submission of specimen other than nasopharyngeal swab, presence of viral mutation(s) within the areas targeted by this assay, and inadequate number of viral copies(<138 copies/mL). A negative result must be combined with clinical observations, patient  history, and epidemiological information. The expected result is Negative.  Fact Sheet for Patients:  EntrepreneurPulse.com.au  Fact Sheet for Healthcare Providers:  IncredibleEmployment.be  This test is no t yet approved or cleared by the [place] FDA and  has been authorized for detection and/or diagnosis of SARS-CoV-2 by FDA under an Emergency Use Authorization (EUA). This EUA will remain  in effect (meaning this test can be used) for the duration of the COVID-19 declaration under Section 564(b)(1) of the Act, 21 U.S.C.section 360bbb-3(b)(1), unless the authorization is terminated  or revoked sooner.       Influenza A by PCR NEGATIVE NEGATIVE Final   Influenza B by PCR NEGATIVE NEGATIVE Final    Comment: (NOTE) The Xpert Xpress SARS-CoV-2/FLU/RSV plus assay is intended as an aid in the diagnosis of influenza from Nasopharyngeal swab specimens and should not be used as a sole basis for treatment. Nasal washings and aspirates are unacceptable for Xpert Xpress SARS-CoV-2/FLU/RSV testing.  Fact Sheet for Patients: EntrepreneurPulse.com.au  Fact Sheet for Healthcare Providers: IncredibleEmployment.be  This test is not yet approved or cleared by the [place] FDA and has been authorized for detection and/or diagnosis of SARS-CoV-2 by FDA under an Emergency Use Authorization (EUA). This EUA will remain in effect (meaning this test can be used) for the duration of the COVID-19 declaration under Section 564(b)(1) of the Act, 21 U.S.C. section 360bbb-3(b)(1), unless the authorization is terminated or revoked.     Resp Syncytial Virus by PCR NEGATIVE NEGATIVE Final    Comment: (NOTE) Fact Sheet for Patients: EntrepreneurPulse.com.au  Fact Sheet for Healthcare Providers: IncredibleEmployment.be  This test is not yet approved or cleared by the [place] FDA  and has been authorized for detection and/or diagnosis of SARS-CoV-2 by FDA under an Emergency Use Authorization (EUA). This EUA will remain in effect (meaning this test can be used) for the duration of the COVID-19 declaration under Section 564(b)(1) of the Act, 21 U.S.C. section 360bbb-3(b)(1), unless the authorization is terminated or revoked.  Performed at [facility], [address] [place], [place] [address]     Coagulation Studies: No results for input(s): "LABPROT", "INR" in the last 72 hours.  Urinalysis: No results for input(s): "COLORURINE", "LABSPEC", "PHURINE", "GLUCOSEU", "HGBUR", "BILIRUBINUR", "KETONESUR", "PROTEINUR", "UROBILINOGEN", "NITRITE", "LEUKOCYTESUR" in the last 72 hours.  Invalid input(s): "APPERANCEUR"    Imaging: No results found.   Medications:    dialysis solution 1.5% low-MG/low-CA      aspirin EC  81 mg Oral Knight   calcitRIOL  0.25 mcg Oral Knight   cinacalcet  30 mg Oral Q supper   doxycycline  100 mg Oral Q12H   ezetimibe  10 mg Oral Knight   furosemide  40 mg Oral Knight   gabapentin  300 mg Oral QHS   gentamicin cream  1 Application Topical Knight   heparin injection (subcutaneous)  5,000 Units Subcutaneous Q8H   insulin aspart  0-5 Units Subcutaneous QHS   insulin aspart  0-9 Units Subcutaneous TID WC   loratadine  10 mg Oral Knight   midodrine  10 mg Oral TID with meals   multivitamin  1 tablet Oral QHS  omega-3 acid ethyl esters  3 g Oral Knight   pantoprazole  40 mg Oral BID AC   acetaminophen **OR** acetaminophen, magnesium hydroxide, ondansetron **OR** ondansetron (ZOFRAN) IV, traZODone  Assessment/ Plan:  Mr. Roger Knight is a 71 y.o.  male with past medical conditions including GERD, hypertension, dyslipidemia, anemia, osteoarthritis, and end-stage renal disease on peritoneal dialysis.  Patient presents to the emergency department with complaints of increased shortness of breath and lower extremity edema.   Patient has been admitted for SOB (shortness of breath) [R06.02] Fluid overload [E87.70] Hypotension, unspecified hypotension type [I95.9] Acute on chronic HFrEF (heart failure with reduced ejection fraction) ([facility]) [I50.23]   End-stage renal disease on peritoneal dialysis.  Patient with significant lower extremity edema up to upper thighs.  Denies missing recent treatments.    Patient received 1 daytime dwell, 3 hours, yesterday.  Received scheduled nightly treatment with 1.5 and 2.5 dialysate.  No UF.  Patient continues to complain of fullness. Discussed with patient and daughter the need to continue nightly treatments and incorporating daytime dwells to manage fluid. Continue Furosemide.   2. Anemia of chronic kidney disease Lab Results  Component Value Date   HGB 9.8 (L) 04/13/2022  Hemoglobin acceptable  3.  Hypotension, etiology unclear. Continue midodrine  4.  Acute on chronic systolic heart failure.  Echo from 03/14/2022 shows EF 25 to 30% with a severe mitral and moderate tricuspid valve regurgitation.  Recent ECHO shows EF <20.  Cardiology recommending follow-up at tertiary center for treatment recommendations.   LOS: 3   12/22/202312:23 PM

## 2022-04-14 NOTE — Plan of Care (Signed)
Problem: Education: Goal: Ability to describe self-care measures that may prevent or decrease complications (Diabetes Survival Skills Education) will improve 04/14/2022 1050 by Alferd Apa, RN Outcome: Adequate for Discharge 04/14/2022 0759 by Alferd Apa, RN Outcome: Progressing Goal: Individualized Educational Video(s) 04/14/2022 1050 by Alferd Apa, RN Outcome: Adequate for Discharge 04/14/2022 0759 by Alferd Apa, RN Outcome: Progressing   Problem: Coping: Goal: Ability to adjust to condition or change in health will improve 04/14/2022 1050 by Alferd Apa, RN Outcome: Adequate for Discharge 04/14/2022 0759 by Alferd Apa, RN Outcome: Progressing   Problem: Fluid Volume: Goal: Ability to maintain a balanced intake and output will improve 04/14/2022 1050 by Alferd Apa, RN Outcome: Adequate for Discharge 04/14/2022 0759 by Alferd Apa, RN Outcome: Progressing   Problem: Health Behavior/Discharge Planning: Goal: Ability to identify and utilize available resources and services will improve 04/14/2022 1050 by Alferd Apa, RN Outcome: Adequate for Discharge 04/14/2022 0759 by Alferd Apa, RN Outcome: Progressing Goal: Ability to manage health-related needs will improve 04/14/2022 1050 by Alferd Apa, RN Outcome: Adequate for Discharge 04/14/2022 0759 by Alferd Apa, RN Outcome: Progressing   Problem: Metabolic: Goal: Ability to maintain appropriate glucose levels will improve 04/14/2022 1050 by Alferd Apa, RN Outcome: Adequate for Discharge 04/14/2022 0759 by Alferd Apa, RN Outcome: Progressing   Problem: Nutritional: Goal: Maintenance of adequate nutrition will improve 04/14/2022 1050 by Alferd Apa, RN Outcome: Adequate for Discharge 04/14/2022 0759 by Alferd Apa, RN Outcome: Progressing Goal: Progress toward achieving an optimal weight will improve 04/14/2022 1050 by Alferd Apa, RN Outcome: Adequate for  Discharge 04/14/2022 0759 by Alferd Apa, RN Outcome: Progressing   Problem: Skin Integrity: Goal: Risk for impaired skin integrity will decrease 04/14/2022 1050 by Alferd Apa, RN Outcome: Adequate for Discharge 04/14/2022 0759 by Alferd Apa, RN Outcome: Progressing   Problem: Tissue Perfusion: Goal: Adequacy of tissue perfusion will improve 04/14/2022 1050 by Alferd Apa, RN Outcome: Adequate for Discharge 04/14/2022 0759 by Alferd Apa, RN Outcome: Progressing   Problem: Education: Goal: Knowledge of General Education information will improve Description: Including pain rating scale, medication(s)/side effects and non-pharmacologic comfort measures 04/14/2022 1050 by Alferd Apa, RN Outcome: Adequate for Discharge 04/14/2022 0759 by Alferd Apa, RN Outcome: Progressing   Problem: Health Behavior/Discharge Planning: Goal: Ability to manage health-related needs will improve 04/14/2022 1050 by Alferd Apa, RN Outcome: Adequate for Discharge 04/14/2022 0759 by Alferd Apa, RN Outcome: Progressing   Problem: Clinical Measurements: Goal: Ability to maintain clinical measurements within normal limits will improve 04/14/2022 1050 by Alferd Apa, RN Outcome: Adequate for Discharge 04/14/2022 0759 by Alferd Apa, RN Outcome: Progressing Goal: Will remain free from infection 04/14/2022 1050 by Alferd Apa, RN Outcome: Adequate for Discharge 04/14/2022 0759 by Alferd Apa, RN Outcome: Progressing Goal: Diagnostic test results will improve 04/14/2022 1050 by Alferd Apa, RN Outcome: Adequate for Discharge 04/14/2022 0759 by Alferd Apa, RN Outcome: Progressing Goal: Respiratory complications will improve 04/14/2022 1050 by Alferd Apa, RN Outcome: Adequate for Discharge 04/14/2022 0759 by Alferd Apa, RN Outcome: Progressing Goal: Cardiovascular complication will be avoided 04/14/2022 1050 by Alferd Apa, RN Outcome: Adequate for  Discharge 04/14/2022 0759 by Alferd Apa, RN Outcome: Progressing   Problem: Activity: Goal: Risk for activity intolerance will decrease 04/14/2022 1050 by Khadim Lundberg, Alfredo Batty, RN Outcome: Adequate for Discharge  04/14/2022 0759 by Alferd Apa, RN Outcome: Progressing   Problem: Nutrition: Goal: Adequate nutrition will be maintained 04/14/2022 1050 by Alferd Apa, RN Outcome: Adequate for Discharge 04/14/2022 0759 by Alferd Apa, RN Outcome: Progressing   Problem: Coping: Goal: Level of anxiety will decrease 04/14/2022 1050 by Alferd Apa, RN Outcome: Adequate for Discharge 04/14/2022 0759 by Alferd Apa, RN Outcome: Progressing   Problem: Elimination: Goal: Will not experience complications related to bowel motility 04/14/2022 1050 by Alferd Apa, RN Outcome: Adequate for Discharge 04/14/2022 0759 by Alferd Apa, RN Outcome: Progressing Goal: Will not experience complications related to urinary retention 04/14/2022 1050 by Alferd Apa, RN Outcome: Adequate for Discharge 04/14/2022 0759 by Alferd Apa, RN Outcome: Progressing   Problem: Pain Managment: Goal: General experience of comfort will improve 04/14/2022 1050 by Alferd Apa, RN Outcome: Adequate for Discharge 04/14/2022 0759 by Alferd Apa, RN Outcome: Progressing   Problem: Safety: Goal: Ability to remain free from injury will improve 04/14/2022 1050 by Alferd Apa, RN Outcome: Adequate for Discharge 04/14/2022 0759 by Alferd Apa, RN Outcome: Progressing   Problem: Skin Integrity: Goal: Risk for impaired skin integrity will decrease 04/14/2022 1050 by Alferd Apa, RN Outcome: Adequate for Discharge 04/14/2022 0759 by Alferd Apa, RN Outcome: Progressing

## 2022-04-24 DEATH — deceased

## 2022-11-01 IMAGING — CT CT ABD-PELV W/O CM
2 of 4 series · 16 of 46 positions shown, 18 images · non-contrast
Comparison: None.

CLINICAL DATA: Kidney transplant evaluation

EXAM:
CT ABDOMEN AND PELVIS WITHOUT CONTRAST
TECHNIQUE: Multidetector CT imaging of the abdomen and pelvis was performed
following the standard protocol without IV contrast. Oral enteric
contrast was administered.

[Series 2: axials routine abdomen pelvis without 5.00 · axial · non-contrast · 0.86mm/px · z∈[-1548,-1088]mm · 13 of 101 slices shown, 15 images]
[im 5/101  soft-tissue]
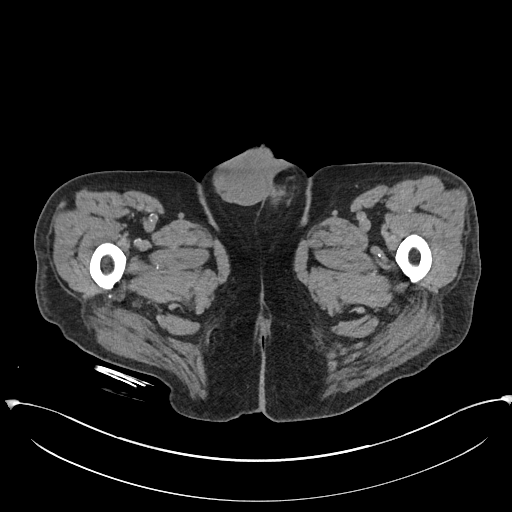
[im 5/101  bone]
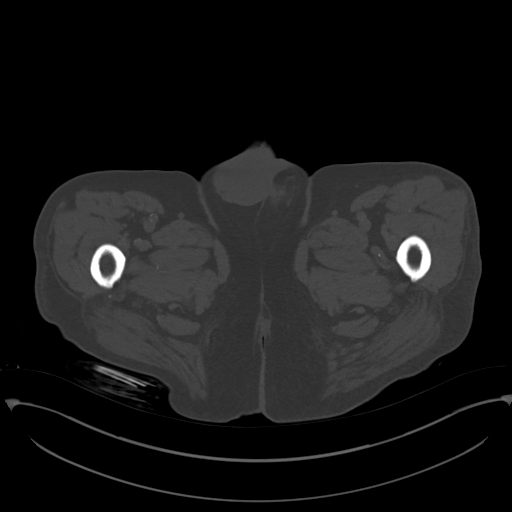
[im 13/101  soft-tissue]
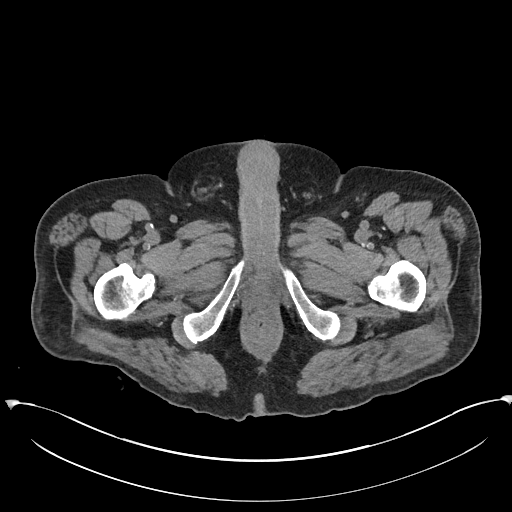
[im 21/101  soft-tissue]
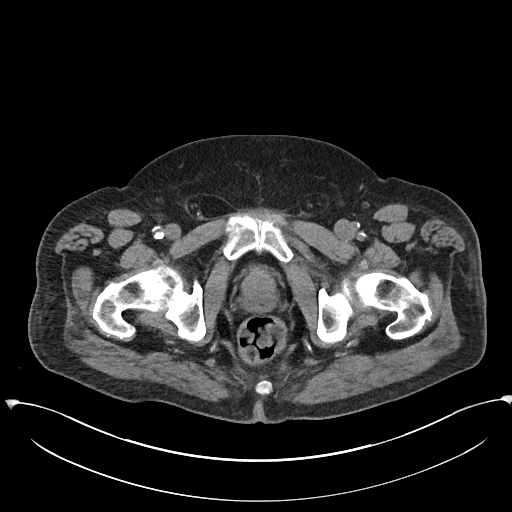
[im 29/101  soft-tissue]
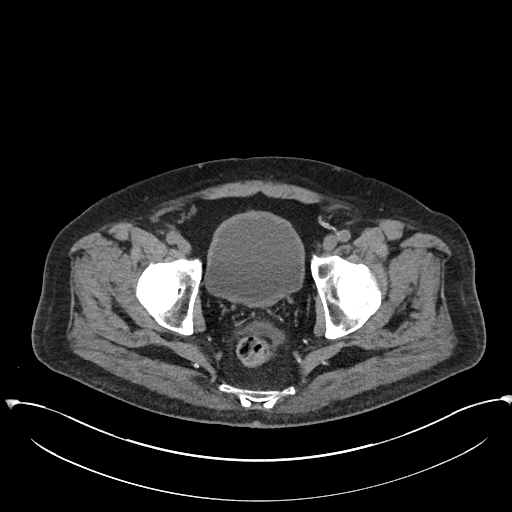
[im 37/101  soft-tissue]
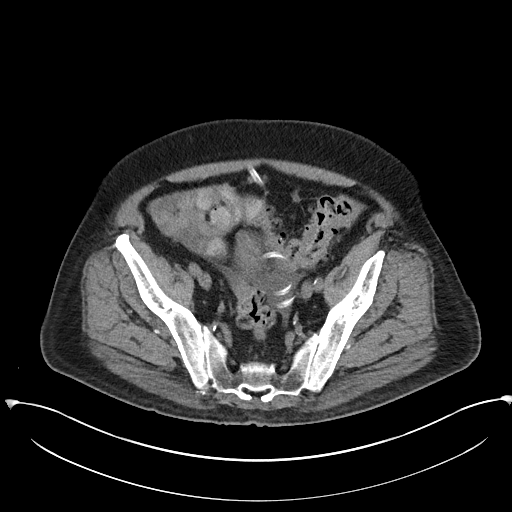
[im 45/101  soft-tissue]
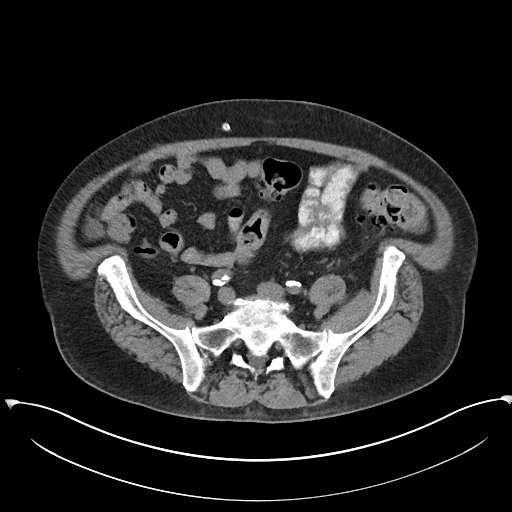
[im 53/101  soft-tissue]
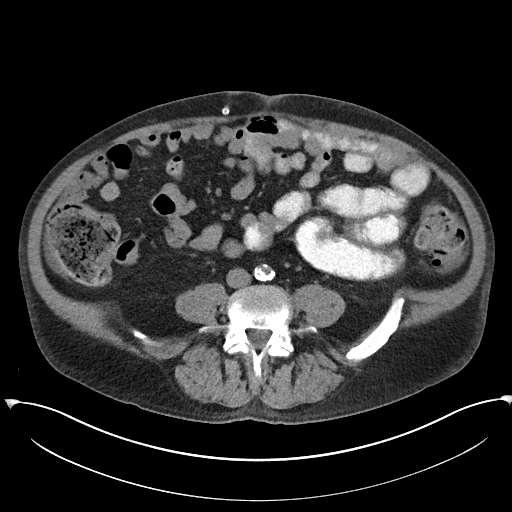
[im 57/101  soft-tissue]
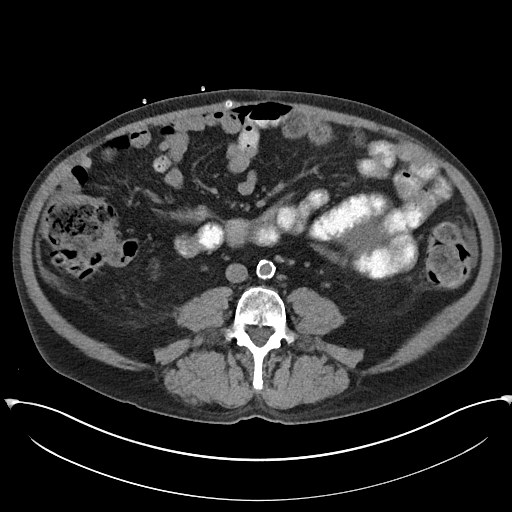
[im 65/101  soft-tissue]
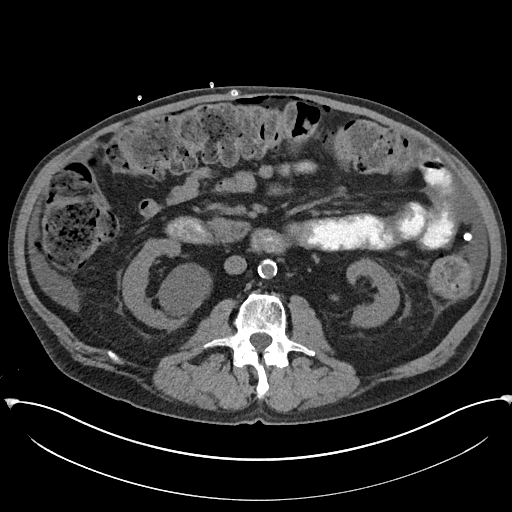
[im 65/101  bone]
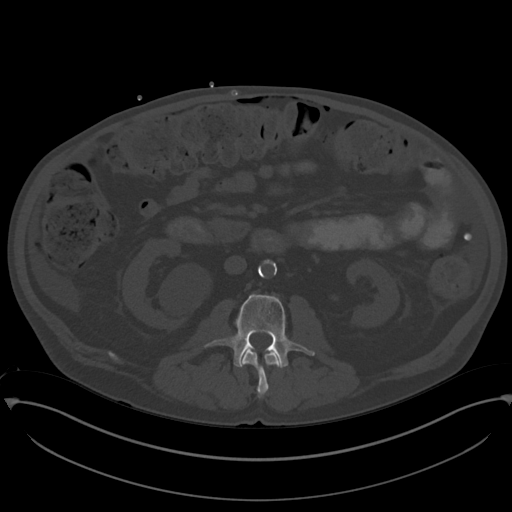
[im 73/101  soft-tissue]
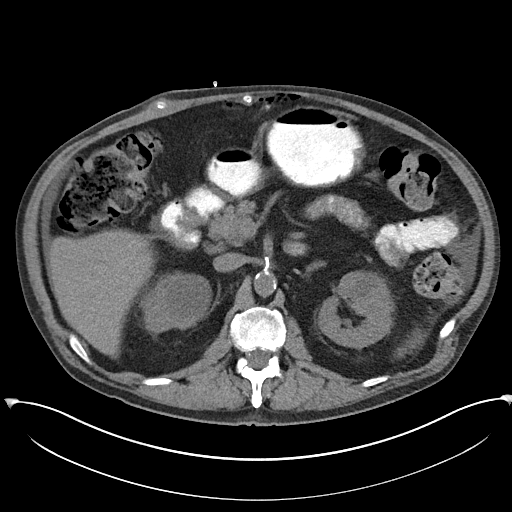
[im 81/101  soft-tissue]
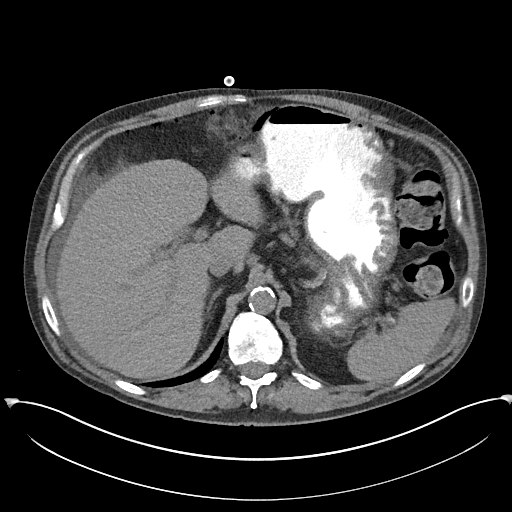
[im 89/101  soft-tissue]
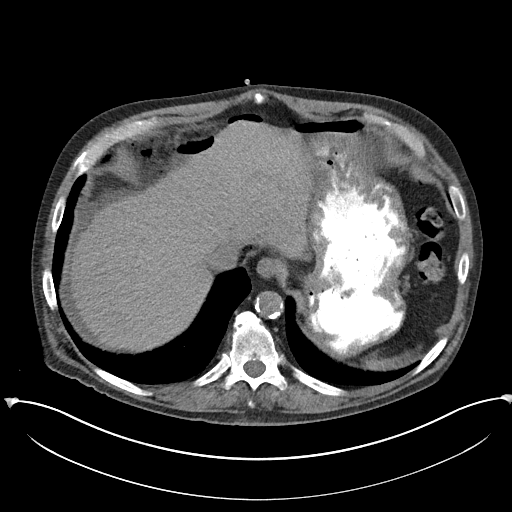
[im 97/101  soft-tissue]
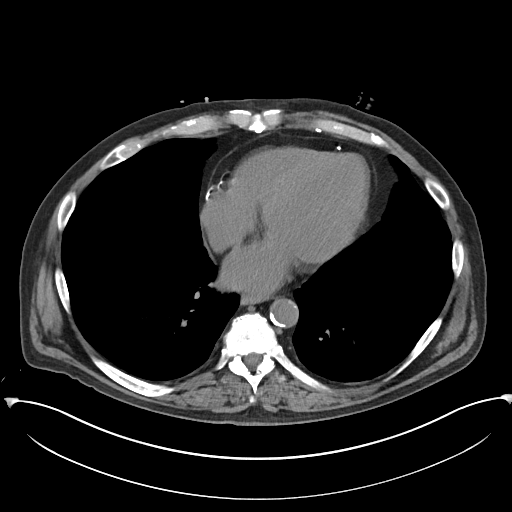

[Series 4: coronals routine abdomen pelvis without 2.00 cor · coronal · non-contrast · 0.86mm/px · 3 of 154 slices shown]
[im 52/154  soft-tissue]
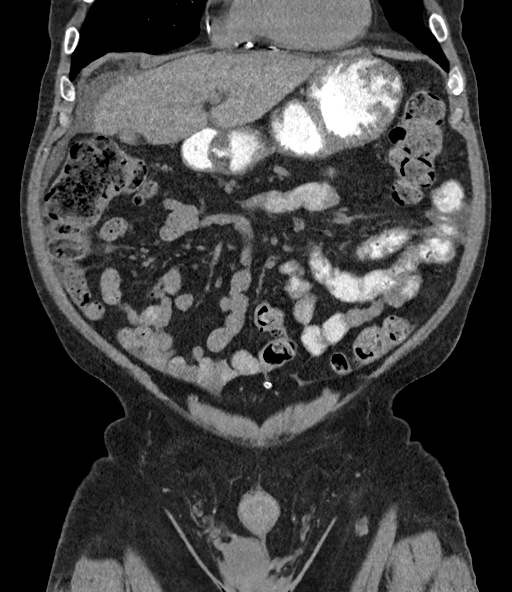
[im 69/154  soft-tissue]
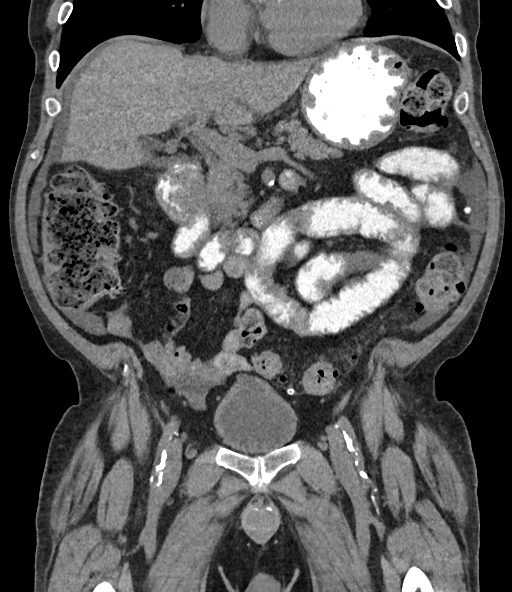
[im 86/154  soft-tissue]
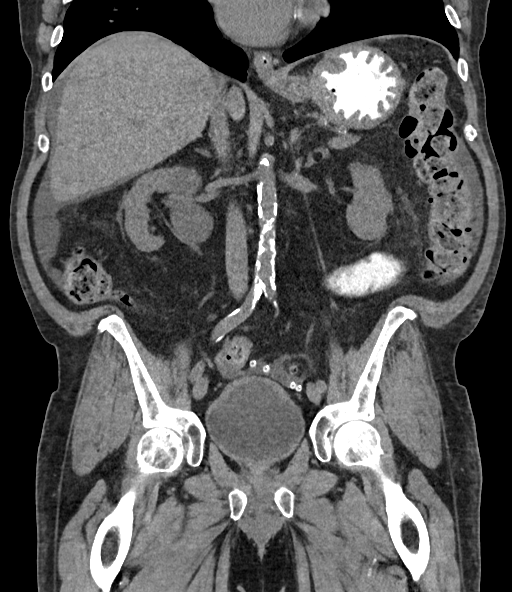

[16 of 46 positions shown; findings below may reference images not displayed]

FINDINGS: Lower chest: No acute abnormality. Three-vessel coronary artery
calcifications.

Hepatobiliary: No solid liver abnormality is seen. Small gallstones
in the dependent gallbladder. No gallbladder wall thickening, or
biliary dilatation.

Pancreas: Unremarkable. No pancreatic ductal dilatation or
surrounding inflammatory changes.

Spleen: Normal in size without significant abnormality.

Adrenals/Urinary Tract: Small incidental myelolipoma of the left
adrenal gland (series 2, image 27). Adrenal glands are otherwise
normal. Bilateral parapelvic cysts. No hydronephrosis. Bladder is
unremarkable.

Stomach/Bowel: Stomach is within normal limits. Appendix appears
normal. No evidence of bowel wall thickening, distention, or
inflammatory changes. Pancolonic diverticulosis. Large burden of
stool throughout the colon and rectum.

Vascular/Lymphatic: Aortic atherosclerosis. No enlarged abdominal or
pelvic lymph nodes.

Reproductive: No mass or other significant abnormality.

Other: Small, fat containing bilateral inguinal hernias. Tenckhoff
type peritoneal dialysis catheter in the central low abdomen with a
small volume of associated ascites and pneumoperitoneum. Calcified
body in the right lower quadrant peritoneal cavity, of uncertain
nature although benign, possibly a previously torsed epiploic
appendage.

Musculoskeletal: No acute or significant osseous findings.
IMPRESSION: 1. Tenckhoff type peritoneal dialysis catheter in the central low
abdomen with a small volume of associated ascites and
pneumoperitoneum.
2. No hydronephrosis.
3. Pancolonic diverticulosis without evidence of acute
diverticulitis.
4. Coronary artery disease.

Aortic Atherosclerosis ([id number]).

## 2023-04-26 NOTE — Telephone Encounter (Signed)
 Error
